# Patient Record
Sex: Female | Born: 1985 | Race: White | Hispanic: No | Marital: Married | Smoking: Former smoker
Health system: Southern US, Community
[De-identification: ages and names within clinical notes are randomized; demographics above are authoritative.]

## PROBLEM LIST (undated history)

## (undated) ENCOUNTER — Inpatient Hospital Stay (HOSPITAL_COMMUNITY): Payer: Self-pay

## (undated) ENCOUNTER — Emergency Department (HOSPITAL_COMMUNITY): Discharge status: Against Medical Advice | Payer: Self-pay

## (undated) DIAGNOSIS — E669 Obesity, unspecified: Secondary | ICD-10-CM

## (undated) DIAGNOSIS — G43909 Migraine, unspecified, not intractable, without status migrainosus: Secondary | ICD-10-CM

## (undated) DIAGNOSIS — Z8711 Personal history of peptic ulcer disease: Secondary | ICD-10-CM

## (undated) DIAGNOSIS — G4730 Sleep apnea, unspecified: Secondary | ICD-10-CM

## (undated) DIAGNOSIS — Z8709 Personal history of other diseases of the respiratory system: Secondary | ICD-10-CM

## (undated) DIAGNOSIS — K219 Gastro-esophageal reflux disease without esophagitis: Secondary | ICD-10-CM

## (undated) DIAGNOSIS — K5792 Diverticulitis of intestine, part unspecified, without perforation or abscess without bleeding: Secondary | ICD-10-CM

## (undated) HISTORY — DX: Gastro-esophageal reflux disease without esophagitis: K21.9

## (undated) HISTORY — DX: Obesity, unspecified: E66.9

## (undated) HISTORY — DX: Personal history of peptic ulcer disease: Z87.11

---
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
## 2006-02-12 ENCOUNTER — Emergency Department: Payer: Self-pay | Admitting: General Practice

## 2007-05-19 ENCOUNTER — Emergency Department: Payer: Self-pay | Admitting: Emergency Medicine

## 2007-08-24 ENCOUNTER — Emergency Department: Payer: Self-pay | Admitting: Emergency Medicine

## 2008-03-04 ENCOUNTER — Emergency Department: Payer: Self-pay | Admitting: Emergency Medicine

## 2008-06-04 ENCOUNTER — Emergency Department: Payer: Self-pay | Admitting: Unknown Physician Specialty

## 2008-08-24 ENCOUNTER — Emergency Department: Payer: Self-pay | Admitting: Emergency Medicine

## 2008-09-28 ENCOUNTER — Emergency Department (HOSPITAL_COMMUNITY): Admission: EM | Admit: 2008-09-28 | Discharge: 2008-09-28 | Payer: Self-pay | Admitting: Emergency Medicine

## 2009-03-02 ENCOUNTER — Emergency Department (HOSPITAL_COMMUNITY): Admission: EM | Admit: 2009-03-02 | Discharge: 2009-03-02 | Payer: Self-pay | Admitting: Emergency Medicine

## 2009-04-18 ENCOUNTER — Emergency Department (HOSPITAL_COMMUNITY): Admission: EM | Admit: 2009-04-18 | Discharge: 2009-04-18 | Payer: Self-pay | Admitting: Emergency Medicine

## 2009-06-03 ENCOUNTER — Emergency Department (HOSPITAL_COMMUNITY): Admission: EM | Admit: 2009-06-03 | Discharge: 2009-06-03 | Payer: Self-pay | Admitting: Family Medicine

## 2009-06-03 ENCOUNTER — Emergency Department (HOSPITAL_COMMUNITY): Admission: EM | Admit: 2009-06-03 | Discharge: 2009-06-04 | Payer: Self-pay | Admitting: Emergency Medicine

## 2009-08-01 ENCOUNTER — Emergency Department (HOSPITAL_COMMUNITY): Admission: EM | Admit: 2009-08-01 | Discharge: 2009-08-01 | Payer: Self-pay | Admitting: Family Medicine

## 2009-10-04 ENCOUNTER — Emergency Department (HOSPITAL_COMMUNITY): Admission: EM | Admit: 2009-10-04 | Discharge: 2009-10-04 | Payer: Self-pay | Admitting: Family Medicine

## 2010-01-28 ENCOUNTER — Emergency Department (HOSPITAL_COMMUNITY): Admission: EM | Admit: 2010-01-28 | Discharge: 2010-01-28 | Payer: Self-pay | Admitting: Family Medicine

## 2010-05-12 ENCOUNTER — Emergency Department (HOSPITAL_COMMUNITY): Admission: EM | Admit: 2010-05-12 | Discharge: 2010-05-12 | Payer: Self-pay | Admitting: Family Medicine

## 2010-07-15 ENCOUNTER — Emergency Department (HOSPITAL_COMMUNITY): Admission: EM | Admit: 2010-07-15 | Discharge: 2010-07-15 | Payer: Self-pay | Admitting: Family Medicine

## 2010-09-24 ENCOUNTER — Inpatient Hospital Stay (INDEPENDENT_AMBULATORY_CARE_PROVIDER_SITE_OTHER)
Admission: RE | Admit: 2010-09-24 | Discharge: 2010-09-24 | Disposition: A | Discharge status: Home / Self Care | Payer: Self-pay | Source: Ambulatory Visit | Attending: Family Medicine | Admitting: Family Medicine

## 2010-09-24 DIAGNOSIS — J069 Acute upper respiratory infection, unspecified: Secondary | ICD-10-CM

## 2010-11-05 LAB — POCT RAPID STREP A (OFFICE): Streptococcus, Group A Screen (Direct): NEGATIVE

## 2010-11-21 LAB — POCT URINALYSIS DIP (DEVICE)
Bilirubin Urine: NEGATIVE
Glucose, UA: NEGATIVE mg/dL
Nitrite: NEGATIVE
Specific Gravity, Urine: 1.02 (ref 1.005–1.030)
Urobilinogen, UA: 0.2 mg/dL (ref 0.0–1.0)
pH: 7 (ref 5.0–8.0)

## 2010-11-21 LAB — POCT I-STAT, CHEM 8
BUN: 17 mg/dL (ref 6–23)
Calcium, Ion: 1.18 mmol/L (ref 1.12–1.32)
Chloride: 105 mEq/L (ref 96–112)
Glucose, Bld: 91 mg/dL (ref 70–99)
Hemoglobin: 14.3 g/dL (ref 12.0–15.0)
Sodium: 141 mEq/L (ref 135–145)
TCO2: 26 mmol/L (ref 0–100)

## 2010-11-21 LAB — POCT PREGNANCY, URINE: Preg Test, Ur: NEGATIVE

## 2010-11-21 LAB — DIFFERENTIAL
Eosinophils Relative: 4 % (ref 0–5)
Neutro Abs: 3.2 10*3/uL (ref 1.7–7.7)

## 2010-11-21 LAB — CBC
MCV: 93.7 fL (ref 78.0–100.0)
RDW: 12.8 % (ref 11.5–15.5)
WBC: 5 10*3/uL (ref 4.0–10.5)

## 2011-01-02 NOTE — H&P (Signed)
NAME:  Robyn Ortiz, Robyn Ortiz                       ACCOUNT NO.:  [id number]   MEDICAL RECORD NO.:  [id number]                   PATIENT TYPE:   LOCATION:                                       FACILITY:  [facility]   PHYSICIAN:  Charles A. Sydnee Cabal, MD            DATE OF BIRTH:  1986-03-13   DATE OF ADMISSION:  10/31/2003  DATE OF DISCHARGE:                                HISTORY & PHYSICAL   BRIEF HISTORY:  25 year old gravida 1 para 0 with [facility] October 23, 2003 now  to be 41 weeks and 1 day estimated gestational age at admission is admitted  to be induced secondary to postdates and a favorable cervix.  She notes  active fetal movement in the office today, has had contractions q.4-37m.  which are mild.  Last office visit on October 26, 2003 cervix was 3 cm  dilated, 50% effaced, -2 station, vertex and intact.  She had a normal  biophysical profile on October 29, 2003 and a reactive NST on October 29, 2003  as well.   PAST MEDICAL HISTORY:  Unremarkable.   SURGICAL HISTORY:  None.   MEDICATIONS:  Prenatal vitamins.   ALLERGIES:  CODEINE.  She states that penicillin is usually not strong  enough for her but is not allergic to this medication.   SOCIAL HISTORY:  Teen pregnancy.  Father of baby involved, Clovis Riley.  Denies  tobacco, alcohol, or drug use.   FAMILY HISTORY:  Patient's father has an enlarged heart, otherwise  unremarkable.   REVIEW OF SYSTEMS:  No skin lesions, headaches, dizziness, chest pain,  shortness of breath, wheezing, diarrhea, constipation, rectal bleeding,  urgency, frequency, or dysuria.   PHYSICAL EXAMINATION:  GENERAL:  Alert and oriented x3.  No distress.  VITALS TODAY:  Blood pressure 100/60, respirations 18, pulse 90, weight 225  pounds.  HEENT:  Exam grossly normal.  NECK:  Supple without thyromegaly or adenopathy.  LUNGS:  Clear to auscultation.  HEART:  A 2/6 systolic ejection murmur left sternal border, otherwise  regular rate and rhythm.  BREAST:  Exam  deferred.  Breasts are symmetrical.  ABDOMEN:  Gravid, fundal height 41 cm, estimated fetal weight 3800 g.  PELVIC:  Normal external female genitalia.  Bartholin, urethral, Skene  glands within normal limits.  Cervix not reexamined today, was 3, 50, and -2  late last week.  Adnexa indeterminate.  RECTAL:  External hemorrhoids not grossly present.  Anus and perineal body  appear normal.   PRENATAL LABORATORIES:  Blood type AB positive, antibody screen negative,  VDRL nonreactive, rubella immune, hepatitis B surface antigen negative, HIV  nonreactive, Pap smear normal, GC and Chlamydia normal, group B strep  positive September 25, 2003, hemoglobin August 07, 2003 12.5, glucose 1-hour  glucola August 07, 2003 was 76, quad screen was normal.  Ultrasounds done  as early as 14 weeks 0 days giving [facility] of October 23, 2003 with uncertain LMP.  Anatomical ultrasounds at 20 weeks 3 days incomplete, complete anatomy was  noted normal 24 weeks and 6 days.   ASSESSMENT:  1. Intrauterine pregnancy 41 weeks and 1 day.  2. Favorable cervix.   PLAN:  I have recommended induction.  Patient gives informed consent and  wishes to proceed.  We will proceed with low-dose Pitocin and artificial  rupture of membranes.  Group B strep prophylaxis will be done with  penicillin.  All questions were answered.  She will be admitted tomorrow  evening on October 31, 2003 at 9:15 p.m. for induction.                                               Charles A. Sydnee Cabal, MD    CAD/MEDQ  D:  10/30/2003  T:  10/31/2003  Job:  [id number]

## 2011-01-02 NOTE — Op Note (Signed)
NAME:  Robyn Ortiz, Robyn Ortiz                       ACCOUNT NO.:  [id number]   MEDICAL RECORD NO.:  [id number]                   PATIENT TYPE:  INP   LOCATION:  9136                                 [facility]ACILITY:  [facility]   PHYSICIAN:  Charles A. Sydnee Cabal, MD            DATE OF BIRTH:  Jun 03, 1986   DATE OF PROCEDURE:  11/01/2003  DATE OF DISCHARGE:                                 OPERATIVE REPORT   DELIVERY NOTE:  See dictated history and physical for complete admission  note.  At 0800 this morning we changed plan to Cervidil overnight and then  Pitocin this morning.  Cervidil was placed about 2220 and removed about  0630.  Cervix change was to a good 3 cm after Cervidil.  The patient  complained of contractions, moderate, q.3-1m., active fetal movement was  noted, no ruptured membranes or bleeding.  Cervix was 3 cm dilated, 50%  effaced, -2 station, vertex, and bulging bag of water.  Fetal heart rate was  120-130s, reactive.  Contractions were q.14m.  Informed consent was given.  Artificial rupture of membranes was done to augment labor.  Moderate  meconium was noted and she consented for IUPC, and this was placed without  difficulty.  Amnioinfusion was started with lactated Ringer's, a 200 mL  bolus and then 150 mL per hour.  Penicillin was given for group B strep  prophylaxis, as she was group B strep positive.  At 12:45 she had been  checked about 30 minutes prior by the R.N. and she was 5-6 cm dilated.  Fetal heart rate was 120-130s, reactive without decelerations.  Contractions  were q.25m. with about 240 [place] units.  Pitocin was at a low dose at  that time.  See record for actual dose in milli-international units per  minute.  She progressed on to complete dilation at approximately 2:35 and  began pushing at that time.  She pushed to spontaneous vaginal delivery just  after 3.  For specific time see delivery record.  Placenta followed  spontaneously and was three-vessel and intact.  DeLee  on the perineum was  carried out, and the baby cried spontaneously thereafter.  The cord was  wrapped around the body x2.  The placenta was spontaneous, three-vessel, and  intact.  The cord gases returned arterial 7.22 and venous 7.35.  First  degree laceration was bleeding at the perineum on the right just inside the  introitus.  This was sutured with 3-0 Vicryl with good hemostasis resulting.  There was a left periurethral laceration on the left upper inner labia  minora.  This required no stitches as it was not bleeding.  Estimated blood  loss was 500 mL.  There was mild atony, which responded to clot expression  and massage.  No further significant bleeding was noted.  Mother and baby  were recovering stably at this time.  Charles A. Sydnee Cabal, MD    CAD/MEDQ  D:  11/01/2003  T:  11/03/2003  Job:  [id number]

## 2011-01-02 NOTE — Discharge Summary (Signed)
NAME:  Robyn Ortiz, TRULSON                       ACCOUNT NO.:  [id number]   MEDICAL RECORD NO.:  [id number]                   PATIENT TYPE:  OBV   LOCATION:  9169                                 [facility]ACILITY:  [facility]   PHYSICIAN:  Juan H. Lily Peer, M.D.             DATE OF BIRTH:  March 20, 1986   DATE OF ADMISSION:  10/19/2003  DATE OF DISCHARGE:  10/20/2003                                 DISCHARGE SUMMARY   DISCHARGE DIAGNOSES:  1. Intrauterine pregnancy at 39+ weeks, undelivered.  2. False labor.  3. Positive group B Streptococcus.   HISTORY:  A 17-years-of-age female gravida 1 para 0 with an EDC of October 23, 2003.  Prenatal course had been benign except for positive group B strep.   HOSPITAL COURSE:  On October 19, 2003 the patient presented at 39+ weeks with  contractions.  She was initially found by nursing to be 2 cm dilated, 70%  effaced, ambulated for a few hours, returned, and then was admitted for  observation because she was uncomfortable.  However, M.D. exam she was 1 cm,  80%, and -3.  The patient had been begun on IV antibiotics for group B strep  prophylaxis and was observed throughout the night.  On October 20, 2003 the  patient had been monitored throughout the night and contractions had  subsided.  With cervix unchanged at 1 cm, 80%, -3, and high.  Fetal heart  rate was reassuring and the patient was discharged to home with labor  instructions.   ACCESSORY CLINICAL FINDINGS/LABORATORY DATA:  On October 19, 2003 hemoglobin  13.2.   DISPOSITION:  The patient was discharged to home with labor instructions.  She was to follow up with Dr. Sydnee Cabal next week if not delivered.  If she  had any problem prior to that time to be seen in the office.     Susa Loffler, P.A.                    Juan H. Lily Peer, M.D.    TSG/MEDQ  D:  11/12/2003  T:  11/12/2003  Job:  [id number]

## 2011-01-02 NOTE — H&P (Signed)
NAME:  Robyn Ortiz, Robyn Ortiz                       ACCOUNT NO.:  [id number]   MEDICAL RECORD NO.:  [id number]                   PATIENT TYPE:  INP   LOCATION:  9169                                 [facility]   PHYSICIAN:  Juan H. Lily Peer, M.D.             DATE OF BIRTH:  August 19, 1985   DATE OF ADMISSION:  10/19/2003  DATE OF DISCHARGE:                                HISTORY & PHYSICAL   CHIEF COMPLAINT:  Contractions.   HISTORY:  The patient is a 25 year old gravida 1, para 0 with an estimated  date of confinement based on ultrasound of March the 08th.  The patient is  currently at 39-4/7ths weeks gestation presents to [facility]  complaining of contractions, which she states started the night before.  She  initially was found to be 2 cm dilated and 70% effaced.  She ambulated for a  few hours, returned,  reexamined again and is now 3 cm, 70-80%, -2 to -3  station and is uncomfortable, and she is admitted.   The patient's prenatal course has been benign with the exception of positive  GBS culture.   PAST MEDICAL HISTORY:  Unremarkable.  A teen pregnancy.   ALLERGIES:  Denies any allergies.   REVIEW OF SYSTEMS:  See [place] form.   PHYSICAL EXAMINATION:  VITAL SIGNS:  Blood pressure 121/80, pulse 75,  temperature 98 and respirations 14.  HEENT: Unremarkable.  NECK:  Neck is supple.  Trachea midline.  No carotid bruits.  No  thyromegaly.  LUNGS:  Lungs are clear to auscultation without rhonchi or wheezes.  HEART:  Regular rate and rhythm.  No murmurs or gallops.  BREASTS:  Breast exam not done.  ABDOMEN:  Gravid uterus, vertex presentation.  _________________.  PELVIC EXAMINATION:  Three centimeters, 80% effaced, -3 station, intact  membranes.  EXTREMITIES:  Trace edema.   PRENATAL LABORATORY DATA:  Blood type AB positive, negative antibody screen.  VDRL was nonreactive.  Rubella titer with evidence of immunity.  Hepatitis B  surface antigen and HIV were negative.   Alpha fetoprotein and triple screen  reported to be normal.  Positive GBS culture.   ASSESSMENT:  1. [age]-year-old gravida 1, para 0 at 39-5/7ths weeks gestation in labor.     Cervix three centimeters, 80% effaced, -2  to -3 station, and reassuring     fetal heart rate tracing.  2. History of positive group B Streptococcus culture.   We will then proceed with pen-G for prophylaxis and have offered her an  epidural, __________ on board.  We may proceed with rupturing of membranes  and internal monitoring.   PLAN:  The plan is for assessment above.  Anticipate vaginal delivery.  Juan H. Lily Peer, M.D.    JHF/MEDQ  D:  10/19/2003  T:  10/20/2003  Job:  [id number]   cc:   Leonette Most A. Sydnee Cabal, MD  Fax: [phone]

## 2011-02-09 ENCOUNTER — Ambulatory Visit (INDEPENDENT_AMBULATORY_CARE_PROVIDER_SITE_OTHER): Payer: Self-pay

## 2011-02-09 ENCOUNTER — Emergency Department (HOSPITAL_COMMUNITY): Payer: Self-pay

## 2011-02-09 ENCOUNTER — Inpatient Hospital Stay (INDEPENDENT_AMBULATORY_CARE_PROVIDER_SITE_OTHER)
Admission: RE | Admit: 2011-02-09 | Discharge: 2011-02-09 | Disposition: A | Discharge status: Home / Self Care | Payer: Self-pay | Source: Ambulatory Visit | Attending: Emergency Medicine | Admitting: Emergency Medicine

## 2011-02-09 ENCOUNTER — Emergency Department (HOSPITAL_COMMUNITY)
Admission: EM | Admit: 2011-02-09 | Discharge: 2011-02-09 | Disposition: A | Discharge status: Home / Self Care | Payer: Self-pay | Attending: Emergency Medicine | Admitting: Emergency Medicine

## 2011-02-09 DIAGNOSIS — S40019A Contusion of unspecified shoulder, initial encounter: Secondary | ICD-10-CM | POA: Insufficient documentation

## 2011-02-09 DIAGNOSIS — R10814 Left lower quadrant abdominal tenderness: Secondary | ICD-10-CM

## 2011-02-09 DIAGNOSIS — S20219A Contusion of unspecified front wall of thorax, initial encounter: Secondary | ICD-10-CM | POA: Insufficient documentation

## 2011-02-09 DIAGNOSIS — M25519 Pain in unspecified shoulder: Secondary | ICD-10-CM | POA: Insufficient documentation

## 2011-02-09 DIAGNOSIS — R109 Unspecified abdominal pain: Secondary | ICD-10-CM | POA: Insufficient documentation

## 2011-02-09 DIAGNOSIS — R079 Chest pain, unspecified: Secondary | ICD-10-CM | POA: Insufficient documentation

## 2011-02-09 LAB — BASIC METABOLIC PANEL
BUN: 15 mg/dL (ref 6–23)
CO2: 25 mEq/L (ref 19–32)
Calcium: 8.7 mg/dL (ref 8.4–10.5)
Creatinine, Ser: 0.53 mg/dL (ref 0.50–1.10)
GFR calc Af Amer: >60 mL/min (ref >60)
Sodium: 137 mEq/L (ref 135–145)

## 2011-02-09 LAB — CBC
HCT: 33.6 % — ABNORMAL LOW (ref 36.0–46.0)
Hemoglobin: 11.6 g/dL — ABNORMAL LOW (ref 12.0–15.0)
WBC: 5.2 10*3/uL (ref 4.0–10.5)

## 2011-02-09 LAB — POCT PREGNANCY, URINE: Preg Test, Ur: NEGATIVE

## 2011-02-09 MED ORDER — IOHEXOL 300 MG/ML  SOLN
100.0000 mL | Freq: Once | INTRAMUSCULAR | Status: AC | PRN
  Administered 2011-02-09: 100 mL via INTRAVENOUS

## 2011-04-03 ENCOUNTER — Inpatient Hospital Stay (HOSPITAL_COMMUNITY)
Admission: RE | Admit: 2011-04-03 | Discharge: 2011-04-03 | Disposition: A | Discharge status: Home / Self Care | Payer: Self-pay | Source: Ambulatory Visit | Attending: Emergency Medicine | Admitting: Emergency Medicine

## 2011-04-04 ENCOUNTER — Inpatient Hospital Stay (INDEPENDENT_AMBULATORY_CARE_PROVIDER_SITE_OTHER)
Admission: RE | Admit: 2011-04-04 | Discharge: 2011-04-04 | Disposition: A | Discharge status: Home / Self Care | Payer: Self-pay | Source: Ambulatory Visit | Attending: Family Medicine | Admitting: Family Medicine

## 2011-04-04 DIAGNOSIS — H1045 Other chronic allergic conjunctivitis: Secondary | ICD-10-CM

## 2011-04-04 DIAGNOSIS — R599 Enlarged lymph nodes, unspecified: Secondary | ICD-10-CM

## 2011-05-08 ENCOUNTER — Inpatient Hospital Stay (INDEPENDENT_AMBULATORY_CARE_PROVIDER_SITE_OTHER)
Admission: RE | Admit: 2011-05-08 | Discharge: 2011-05-08 | Disposition: A | Discharge status: Home / Self Care | Payer: Self-pay | Source: Ambulatory Visit | Attending: Family Medicine | Admitting: Family Medicine

## 2011-05-08 DIAGNOSIS — J020 Streptococcal pharyngitis: Secondary | ICD-10-CM

## 2011-05-08 LAB — POCT INFECTIOUS MONO SCREEN: Mono Screen: NEGATIVE

## 2011-09-16 ENCOUNTER — Emergency Department (INDEPENDENT_AMBULATORY_CARE_PROVIDER_SITE_OTHER)
Admission: EM | Admit: 2011-09-16 | Discharge: 2011-09-16 | Disposition: A | Discharge status: Home / Self Care | Payer: Self-pay | Source: Home / Self Care | Attending: Emergency Medicine | Admitting: Emergency Medicine

## 2011-09-16 ENCOUNTER — Emergency Department (INDEPENDENT_AMBULATORY_CARE_PROVIDER_SITE_OTHER): Payer: Self-pay

## 2011-09-16 ENCOUNTER — Encounter (HOSPITAL_COMMUNITY): Payer: Self-pay | Admitting: *Deleted

## 2011-09-16 DIAGNOSIS — K5900 Constipation, unspecified: Secondary | ICD-10-CM

## 2011-09-16 DIAGNOSIS — R109 Unspecified abdominal pain: Secondary | ICD-10-CM

## 2011-09-16 LAB — POCT I-STAT, CHEM 8
Calcium, Ion: 1.22 mmol/L (ref 1.12–1.32)
Glucose, Bld: 80 mg/dL (ref 70–99)
HCT: 44 % (ref 36.0–46.0)
TCO2: 24 mmol/L (ref 0–100)

## 2011-09-16 LAB — CBC
HCT: 38.7 % (ref 36.0–46.0)
RDW: 12.5 % (ref 11.5–15.5)
WBC: 5.1 10*3/uL (ref 4.0–10.5)

## 2011-09-16 LAB — RAPID URINE DRUG SCREEN, HOSP PERFORMED
Amphetamines: NOT DETECTED
Barbiturates: NOT DETECTED
Benzodiazepines: NOT DETECTED
Cocaine: NOT DETECTED
Opiates: NOT DETECTED
Tetrahydrocannabinol: NOT DETECTED

## 2011-09-16 LAB — DIFFERENTIAL
Basophils Absolute: 0 10*3/uL (ref 0.0–0.1)
Basophils Relative: 1 % (ref 0–1)
Lymphocytes Relative: 29 % (ref 12–46)
Monocytes Absolute: 0.4 10*3/uL (ref 0.1–1.0)
Neutro Abs: 3 10*3/uL (ref 1.7–7.7)
Neutrophils Relative %: 59 % (ref 43–77)

## 2011-09-16 LAB — HEPATIC FUNCTION PANEL
AST: 14 U/L (ref 0–37)
Albumin: 3.8 g/dL (ref 3.5–5.2)
Total Bilirubin: 0.4 mg/dL (ref 0.3–1.2)
Total Protein: 7.1 g/dL (ref 6.0–8.3)

## 2011-09-16 LAB — POCT URINALYSIS DIP (DEVICE)
Bilirubin Urine: NEGATIVE
Glucose, UA: NEGATIVE mg/dL
Hgb urine dipstick: NEGATIVE
Specific Gravity, Urine: 1.025 (ref 1.005–1.030)
Urobilinogen, UA: 0.2 mg/dL (ref 0.0–1.0)
pH: 6 (ref 5.0–8.0)

## 2011-09-16 LAB — LIPASE, BLOOD: Lipase: 28 U/L (ref 11–59)

## 2011-09-16 MED ORDER — OMEPRAZOLE 20 MG PO CPDR: 40.0000 mg | DELAYED_RELEASE_CAPSULE | Freq: Every day | ORAL | Status: DC

## 2011-09-16 NOTE — ED Provider Notes (Addendum)
History     CSN: [id number]  Arrival date & time 09/16/11  1010   First MD Initiated Contact with Patient 09/16/11 1029      Chief Complaint  Patient presents with  . Abdominal Pain    (Consider location/radiation/quality/duration/timing/severity/associated sxs/prior treatment) HPI Comments: Sudden pain on my abdomen (points to RUQ and epigastric area), no vomiting took one motrin, felt better, but still sore a bit, No fevers, No vomitting  Patient is a 26 y.o. female presenting with abdominal pain. The history is provided by the patient.  Abdominal Pain The primary symptoms of the illness include abdominal pain. The primary symptoms of the illness do not include fever, fatigue, shortness of breath, vomiting, diarrhea, hematochezia or dysuria. The onset of the illness was sudden.  Symptoms associated with the illness do not include chills, anorexia, urgency or frequency. Significant associated medical issues do not include PUD.    History reviewed. No pertinent past medical history.  History reviewed. No pertinent past surgical history.  History reviewed. No pertinent family history.  History  Substance Use Topics  . Smoking status: Current Everyday Smoker -- 1.0 packs/day    Types: Cigarettes  . Smokeless tobacco: Not on file  . Alcohol Use: Yes     occassional    OB History    Grav Para Term Preterm Abortions TAB SAB Ect Mult Living                  Review of Systems  Constitutional: Negative for fever, chills, activity change and fatigue.  Respiratory: Negative for cough and shortness of breath.   Gastrointestinal: Positive for abdominal pain. Negative for vomiting, diarrhea, hematochezia and anorexia.  Genitourinary: Negative for dysuria, urgency and frequency.    Allergies  Hydrocodone  Home Medications   Current Outpatient Rx  Name Route Sig Dispense Refill  . LEVONORGEST-ETH ESTRAD 91-DAY 0.15-0.03 MG PO TABS Oral Take 1 tablet by mouth daily.    Marland Kitchen  OMEPRAZOLE 20 MG PO CPDR Oral Take 2 capsules (40 mg total) by mouth daily. 7 capsule 0    BP 110/51  Pulse 68  Temp(Src) 98 F (36.7 C) (Oral)  Resp 16  SpO2 98%  LMP 08/01/2011  Physical Exam  Nursing note and vitals reviewed. Constitutional: She appears well-developed and well-nourished. No distress.  HENT:  Head: Normocephalic.  Eyes: EOM are normal. No scleral icterus.  Pulmonary/Chest: Effort normal.  Abdominal: Soft. She exhibits no shifting dullness, no distension and no mass. There is no hepatomegaly. There is tenderness in the right upper quadrant and epigastric area. There is no rigidity, no rebound, no guarding, no tenderness at McBurney's point and negative Murphy's sign.    Skin: Skin is warm. No rash noted. No erythema.    ED Course  Procedures (including critical care time)   Labs Reviewed  POCT URINALYSIS DIP (DEVICE)  POCT PREGNANCY, URINE  CBC  DIFFERENTIAL  LIPASE, BLOOD  HEPATIC FUNCTION PANEL  URINE RAPID DRUG SCREEN (HOSP PERFORMED)  POCT I-STAT, CHEM 8   Dg Abd 1 View  09/16/2011  *RADIOLOGY REPORT*  Clinical Data: Generalized abdominal pain.  ABDOMEN - 1 VIEW  Comparison: CT 02/09/2011  Findings: Moderate stool burden in the right side of the colon. There is a nonobstructive bowel gas pattern.  No supine evidence of free air.  No organomegaly or suspicious calcification.  No acute bony abnormality.  IMPRESSION: Moderate stool burden.  No acute findings.  Original Report Authenticated By: Cyndie Chime, M.D.  1. Abdominal pain   2. Constipation       MDM  Abdominal pain- sudden onset less < 24 hours- afebrile- moderate stool burden- afebrile No vomit ting- soft and non-reactive abdomen        Jimmie Molly, MD 09/16/11 1238  Jimmie Molly, MD 09/16/11 1339

## 2011-09-16 NOTE — ED Notes (Signed)
Pt  Informed  Of     Delay  In lab  Work

## 2011-09-16 NOTE — ED Notes (Signed)
Pt states she was awakened out of her sleep early this am with upper abd pain, sharp in nature.  Denies nausea, vomiting or diarrhea.  States last BM was yesterday am which was normal.  States pain eased up after taking some Ibuprofen but it's still there.  States pain is across upper abd but goes down both sides.  States she didn't eat anything different than normal last pm.  No fried or greasy foods.  Missed a few of her BC pills and has not had a period since 08/01/11

## 2011-10-06 ENCOUNTER — Emergency Department (INDEPENDENT_AMBULATORY_CARE_PROVIDER_SITE_OTHER)
Admission: EM | Admit: 2011-10-06 | Discharge: 2011-10-06 | Disposition: A | Discharge status: Home / Self Care | Payer: Self-pay | Source: Home / Self Care | Attending: Emergency Medicine | Admitting: Emergency Medicine

## 2011-10-06 ENCOUNTER — Encounter (HOSPITAL_COMMUNITY): Payer: Self-pay | Admitting: *Deleted

## 2011-10-06 DIAGNOSIS — J039 Acute tonsillitis, unspecified: Secondary | ICD-10-CM

## 2011-10-06 LAB — POCT RAPID STREP A: Streptococcus, Group A Screen (Direct): NEGATIVE

## 2011-10-06 MED ORDER — HYDROCODONE-ACETAMINOPHEN 5-325 MG PO TABS: 2.0000 | ORAL_TABLET | ORAL | Status: AC | PRN

## 2011-10-06 MED ORDER — PENICILLIN V POTASSIUM 500 MG PO TABS: 500.0000 mg | ORAL_TABLET | Freq: Four times a day (QID) | ORAL | Status: AC

## 2011-10-06 NOTE — Discharge Instructions (Signed)
Tonsillitis     Tonsils are lumps of tissues at the back of the throat. Tonsillitis is an infection of the throat. It causes the tonsils to become red, tender, and puffy (swollen). If the tonsillitis is severe and happens often, you may need to get your tonsils removed (tonsillectomy).  HOME CARE   · Rest often.   · Drink plenty of fluids to keep your pee (urine) clear or pale yellow.   · While the throat is sore, eat soft or liquid foods like:   · Milkshakes.   · Ice cream.   · Soups.   · Suck on frozen ice pops.   · Gargle with a warm or cold liquid to help soothe the throat. Gargle with a water and salt mix.   · Do not go to work or school until your fever is gone.   · Only take medicines as told by your doctor   GET HELP RIGHT AWAY IF:   · You develop a rash.   · You cough up yellow-brown or bloody spit.   · You start to throw up (vomit).   · You develop a headache, stiff neck, chest pain, or have trouble breathing or swallowing.   · You have worse throat pain and drooling.   · You cannot open your mouth fully.   · Your voice changes.   · You have a fever.   · Your baby is older than 3 months with a rectal temperature of 102° F (38.9° C) or higher.   · Your baby is 3 months old or younger with a rectal temperature of 100.4° F (38° C) or higher.   MAKE SURE YOU:   · Understand these instructions.   · Will watch your condition.   · Will get help right away if you are not doing well or get worse.   Document Released: 01/20/2008 Document Revised: 04/15/2011 Document Reviewed: 01/20/2008  Exit[employer]® Patient Information ©2012 [employer].

## 2011-10-06 NOTE — ED Notes (Signed)
Pt  Reports  sorethroat       Body  Aches     Chills      As  Well     As      Headaches    Symptoms  X     3  Days   somehta  Dry  Non  Productive  Cough  exhibited

## 2011-10-06 NOTE — ED Provider Notes (Signed)
History     CSN: [id number]  Arrival date & time 10/06/11  1305   First MD Initiated Contact with Patient 10/06/11 1457      Chief Complaint  Patient presents with  . Sore Throat    (Consider location/radiation/quality/duration/timing/severity/associated sxs/prior treatment) Patient is a 26 y.o. female presenting with pharyngitis. The history is provided by the patient. No language interpreter was used.  Sore Throat This is a new problem. The current episode started more than 2 days ago. The problem occurs constantly. The problem has been gradually worsening. Associated symptoms include headaches. The symptoms are aggravated by nothing. The symptoms are relieved by nothing. She has tried nothing for the symptoms.  Pt complains of a sorethroat and swollen tonsils  History reviewed. No pertinent past medical history.  History reviewed. No pertinent past surgical history.  History reviewed. No pertinent family history.  History  Substance Use Topics  . Smoking status: Current Everyday Smoker -- 1.0 packs/day    Types: Cigarettes  . Smokeless tobacco: Not on file  . Alcohol Use: Yes     occassional    OB History    Grav Para Term Preterm Abortions TAB SAB Ect Mult Living                  Review of Systems  HENT: Positive for congestion and rhinorrhea.   Neurological: Positive for headaches.  All other systems reviewed and are negative.    Allergies  Hydrocodone  Home Medications   Current Outpatient Rx  Name Route Sig Dispense Refill  . LEVONORGEST-ETH ESTRAD 91-DAY 0.15-0.03 MG PO TABS Oral Take 1 tablet by mouth daily.    Marland Kitchen OMEPRAZOLE 20 MG PO CPDR Oral Take 2 capsules (40 mg total) by mouth daily. 7 capsule 0    BP 118/71  Pulse 84  Temp(Src) 98.2 F (36.8 C) (Oral)  Resp 16  SpO2 98%  LMP 09/25/2011  Physical Exam  Vitals reviewed. Constitutional: She appears well-developed and well-nourished.  HENT:  Head: Normocephalic and atraumatic.  Right  Ear: External ear normal.  Left Ear: External ear normal.  Mouth/Throat: Oropharyngeal exudate present.       Swollen exudative tonsils,  Eyes: Conjunctivae are normal. Pupils are equal, round, and reactive to light.  Neck: Normal range of motion. Neck supple.  Cardiovascular: Normal rate.   Pulmonary/Chest: Effort normal and breath sounds normal.  Abdominal: Soft.  Musculoskeletal: Normal range of motion.  Neurological: She is alert.  Skin: Skin is warm.    ED Course  Procedures (including critical care time)   Labs Reviewed  POCT RAPID STREP A ([facility]RG CARE ONLY)   No results found.   No diagnosis found.    MDM  Pcn  Given for tonsillitis.  Rx for hydrocodone for pain        Langston Masker, [place] 10/06/11 1521

## 2011-10-08 NOTE — ED Provider Notes (Signed)
Medical screening examination/treatment/procedure(s) were performed by non-physician practitioner and as supervising physician I was immediately available for consultation/collaboration.  Leslee Home, M.D.   Roque Lias, MD 10/08/11 1020

## 2012-01-01 ENCOUNTER — Emergency Department (HOSPITAL_COMMUNITY)
Admission: EM | Admit: 2012-01-01 | Discharge: 2012-01-01 | Disposition: A | Discharge status: Home / Self Care | Payer: Self-pay | Source: Home / Self Care

## 2012-01-04 ENCOUNTER — Encounter (HOSPITAL_COMMUNITY): Payer: Self-pay | Admitting: *Deleted

## 2012-01-04 ENCOUNTER — Emergency Department (HOSPITAL_COMMUNITY)
Admission: EM | Admit: 2012-01-04 | Discharge: 2012-01-04 | Disposition: A | Discharge status: Home / Self Care | Payer: Self-pay | Attending: Emergency Medicine | Admitting: Emergency Medicine

## 2012-01-04 DIAGNOSIS — X19XXXA Contact with other heat and hot substances, initial encounter: Secondary | ICD-10-CM | POA: Insufficient documentation

## 2012-01-04 DIAGNOSIS — T24039A Burn of unspecified degree of unspecified lower leg, initial encounter: Secondary | ICD-10-CM | POA: Insufficient documentation

## 2012-01-04 DIAGNOSIS — T310 Burns involving less than 10% of body surface: Secondary | ICD-10-CM | POA: Insufficient documentation

## 2012-01-04 DIAGNOSIS — T300 Burn of unspecified body region, unspecified degree: Secondary | ICD-10-CM

## 2012-01-04 MED ORDER — CEPHALEXIN 500 MG PO CAPS: 500.0000 mg | ORAL_CAPSULE | Freq: Four times a day (QID) | ORAL | Status: AC

## 2012-01-04 MED ORDER — OXYCODONE-ACETAMINOPHEN 5-325 MG PO TABS
1.0000 | ORAL_TABLET | Freq: Once | ORAL | Status: AC
  Administered 2012-01-04: 1 via ORAL
  Filled 2012-01-04: qty 1

## 2012-01-04 MED ORDER — OXYCODONE-ACETAMINOPHEN 5-325 MG PO TABS: 2.0000 | ORAL_TABLET | ORAL | Status: AC | PRN
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
## 2012-01-04 NOTE — Discharge Instructions (Signed)
Keep your wound clean and dry.  You may wash it with soap and water.   Use ibuprofen 600 mg every 6 hours for pain and swelling.  Use percocet for more severe pain. Use keflex for infection.  Follow up with your doctor as needed.
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
## 2012-01-26 ENCOUNTER — Encounter (HOSPITAL_COMMUNITY): Payer: Self-pay | Admitting: Emergency Medicine

## 2012-01-26 ENCOUNTER — Emergency Department (HOSPITAL_COMMUNITY)
Admission: EM | Admit: 2012-01-26 | Discharge: 2012-01-26 | Disposition: A | Discharge status: Home Health | Payer: Self-pay | Attending: Emergency Medicine | Admitting: Emergency Medicine

## 2012-01-26 DIAGNOSIS — J029 Acute pharyngitis, unspecified: Secondary | ICD-10-CM

## 2012-01-26 DIAGNOSIS — R070 Pain in throat: Secondary | ICD-10-CM | POA: Insufficient documentation

## 2012-01-26 LAB — CBC
Hemoglobin: 13.1 g/dL (ref 12.0–15.0)
MCH: 31 pg (ref 26.0–34.0)
MCHC: 34.1 g/dL (ref 30.0–36.0)
RDW: 12.7 % (ref 11.5–15.5)

## 2012-01-26 LAB — RAPID STREP SCREEN (MED CTR MEBANE ONLY): Streptococcus, Group A Screen (Direct): POSITIVE — AB

## 2012-01-26 LAB — MONONUCLEOSIS SCREEN: Mono Screen: NEGATIVE

## 2012-01-26 MED ORDER — DEXAMETHASONE SODIUM PHOSPHATE 10 MG/ML IJ SOLN
10.0000 mg | Freq: Once | INTRAMUSCULAR | Status: AC
  Administered 2012-01-26: 10 mg via INTRAVENOUS
  Filled 2012-01-26: qty 1

## 2012-01-26 MED ORDER — SODIUM CHLORIDE 0.9 % IV BOLUS (SEPSIS)
1000.0000 mL | Freq: Once | INTRAVENOUS | Status: AC
  Administered 2012-01-26: 1000 mL via INTRAVENOUS

## 2012-01-26 MED ORDER — IBUPROFEN 600 MG PO TABS: 600.0000 mg | ORAL_TABLET | Freq: Four times a day (QID) | ORAL | Status: AC | PRN

## 2012-01-26 MED ORDER — AMOXICILLIN 500 MG PO CAPS: 500.0000 mg | ORAL_CAPSULE | Freq: Three times a day (TID) | ORAL | Status: AC

## 2012-01-26 MED ORDER — HYDROCODONE-ACETAMINOPHEN 5-500 MG PO TABS: 1.0000 | ORAL_TABLET | Freq: Four times a day (QID) | ORAL | Status: AC | PRN

## 2012-01-26 MED ORDER — DEXTROSE 5 % IV SOLN
1.0000 g | INTRAVENOUS | Status: DC
  Administered 2012-01-26: 1 g via INTRAVENOUS
  Filled 2012-01-26: qty 10

## 2012-01-26 NOTE — ED Notes (Signed)
Pt given blanket.

## 2012-01-26 NOTE — ED Notes (Signed)
Pt with sore throat x 3 days, patient unable to swallow due to pain, patient regularly spitting into trash can due to pain, patient with swollen glands in neck

## 2012-01-26 NOTE — Discharge Instructions (Signed)
Go directly to the ENT doctors office now, Dr Jearld Fenton, for evaluation by him.  Do not eat or drink prior to seeing Dr Jearld Fenton. Your strep test is positive. Take amoxicillin as prescribed. Take motrin as need for pain. You may also take vicodin as need for pain. No driving when taking vicodin. Also, do not take tylenol or acetaminophen containing medication when taking vicodin. Return to ER if worse, trouble breathing, unable to swallow, other concern.      Sore Throat Sore throats may be caused by bacteria and viruses. They may also be caused by:  Smoking.   Pollution.   Allergies.  If a sore throat is due to strep infection (a bacterial infection), you may need:  A throat swab.   A culture test to verify the strep infection.  You will need one of these:  An antibiotic shot.   Oral medicine for a full 10 days.  Strep infection is very contagious. A doctor should check any close contacts who have a sore throat or fever. A sore throat caused by a virus infection will usually last only 3-4 days. Antibiotics will not treat a viral sore throat.  Infectious mononucleosis (a viral disease), however, can cause a sore throat that lasts for up to 3 weeks. Mononucleosis can be diagnosed with blood tests. You must have been sick for at least 1 week in order for the test to give accurate results. HOME CARE INSTRUCTIONS   To treat a sore throat, take mild pain medicine.   Increase your fluids.   Eat a soft diet.   Do not smoke.   Gargling with warm water or salt water (1 tsp. salt in 8 oz. water) can be helpful.   Try throat sprays or lozenges or sucking on hard candy to ease the symptoms.  Call your doctor if your sore throat lasts longer than 1 week.  SEEK IMMEDIATE MEDICAL CARE IF:  You have difficulty breathing.   You have increased swelling in the throat.   You have pain so severe that you are unable to swallow fluids or your saliva.   You have a severe headache, a high fever,  vomiting, or a red rash.  Document Released: 09/10/2004 Document Revised: 07/23/2011 Document Reviewed: 07/21/2007 [facility] Patient Information 2012 [place], [place].      Strep Throat Strep throat is an infection of the throat caused by a bacteria named Streptococcus pyogenes. Your caregiver may call the infection streptococcal "tonsillitis" or "pharyngitis" depending on whether there are signs of inflammation in the tonsils or back of the throat. Strep throat is most common in children from 25 to 35 years old during the cold months of the year, but it can occur in people of any age during any season. This infection is spread from person to person (contagious) through coughing, sneezing, or other close contact. SYMPTOMS   Fever or chills.   Painful, swollen, red tonsils or throat.   Pain or difficulty when swallowing.   White or yellow spots on the tonsils or throat.   Swollen, tender lymph nodes or "glands" of the neck or under the jaw.   Red rash all over the body (rare).  DIAGNOSIS  Many different infections can cause the same symptoms. A test must be done to confirm the diagnosis so the right treatment can be given. A "rapid strep test" can help your caregiver make the diagnosis in a few minutes. If this test is not available, a light swab of the infected area can  be used for a throat culture test. If a throat culture test is done, results are usually available in a day or two. TREATMENT  Strep throat is treated with antibiotic medicine. HOME CARE INSTRUCTIONS   Gargle with 1 tsp of salt in 1 cup of warm water, 3 to 4 times per day or as needed for comfort.   Family members who also have a sore throat or fever should be tested for strep throat and treated with antibiotics if they have the strep infection.   Make sure everyone in your household washes their hands well.   Do not share food, drinking cups, or personal items that could cause the infection to spread to others.    You may need to eat a soft food diet until your sore throat gets better.   Drink enough water and fluids to keep your urine clear or pale yellow. This will help prevent dehydration.   Get plenty of rest.   Stay home from school, daycare, or work until you have been on antibiotics for 24 hours.   Only take over-the-counter or prescription medicines for pain, discomfort, or fever as directed by your caregiver.   If antibiotics are prescribed, take them as directed. Finish them even if you start to feel better.  SEEK MEDICAL CARE IF:   The glands in your neck continue to enlarge.   You develop a rash, cough, or earache.   You cough up green, yellow-brown, or bloody sputum.   You have pain or discomfort not controlled by medicines.   Your problems seem to be getting worse rather than better.  SEEK IMMEDIATE MEDICAL CARE IF:   You develop any new symptoms such as vomiting, severe headache, stiff or painful neck, chest pain, shortness of breath, or trouble swallowing.   You develop severe throat pain, drooling, or changes in your voice.   You develop swelling of the neck, or the skin on the neck becomes red and tender.   You have a fever.   You develop signs of dehydration, such as fatigue, dry mouth, and decreased urination.   You become increasingly sleepy, or you cannot wake up completely.  Document Released: 07/31/2000 Document Revised: 07/23/2011 Document Reviewed: 10/02/2010 [facility] Patient Information 2012 [place], [place].      Peritonsillar Abscess A peritonsillar abscess is a collection of pus located in the back of the throat behind the tonsils. It usually occurs when a streptococcal infection of the throat or tonsils spreads into the space around the tonsils. They are almost always caused by the streptococcal germ (bacteria). The treatment of a peritonsillar abscess is most often drainage accomplished by putting a needle into the abscess or cutting (incising) and  draining the abscess. This is most often followed with a course of antibiotics. HOME CARE INSTRUCTIONS  If your abscess was drained by your caregiver today, rinse your throat (gargle) with warm salt water four times per day or as needed for comfort. Do not swallow this mixture. Mix 1 teaspoon of salt in 8 ounces of warm water for gargling.   Rest in bed as needed. Resume activities as able.   Apply cold to your neck for pain relief. Fill a plastic bag with ice and wrap it in a towel. Hold the ice on your neck for 20 minutes 4 times per day.   Eat a soft or liquid diet as tolerated while your throat remains sore. Popsicles and ice cream may be good early choices. Drinking plenty of cold fluids will  probably be soothing and help take swelling down in between the warm gargles.   Only take over-the-counter or prescription medicines for pain, discomfort, or fever as directed by your caregiver. Do not use aspirin unless directed by your physician. Aspirin slows down the clotting process. It can also cause bleeding from the drainage area if this was needled or incised today.   If antibiotics were prescribed, take them as directed for the full course of the prescription. Even if you feel you are well, you need to take them.  SEEK MEDICAL CARE IF:   You have increased pain, swelling, redness, or drainage in your throat.   You develop signs of infection such as dizziness, headache, lethargy, or generalized feelings of illness.   You have difficulty breathing, swallowing or eating.   You show signs of becoming dehydrated (lightheadedness when standing, decreased urine output, a fast heart rate, or dry mouth and mucous membranes).  SEEK IMMEDIATE MEDICAL CARE IF:   You have a fever.   You are coughing up or vomiting blood.   You develop more severe throat pain uncontrolled with medicines or you start to drool.   You develop difficulty breathing, talking, or find it easier to breathe while leaning  forward.  Document Released: 08/03/2005 Document Revised: 07/23/2011 Document Reviewed: 03/16/2008 [facility] Patient Information 2012 [place], [place].

## 2012-01-26 NOTE — ED Provider Notes (Signed)
History    This chart was scribed for Suzi Roots, MD, MD by Smitty Pluck. The patient was seen in room STRE3 and the patient's care was started at 11:14AM.   CSN: [id number]  Arrival date & time 01/26/12  1039   None     Chief Complaint  Patient presents with  . Sore Throat    (Consider location/radiation/quality/duration/timing/severity/associated sxs/prior treatment) The history is provided by the patient.   Robyn Ortiz is a 26 y.o. female who presents to the Emergency Department complaining of moderate sore throat onset 2 days ago. Pt reports having rhinorrhea and fever. Denies sick contact. Reports recurrent throat infections. Symptoms have been constant since onset. Denies cough. Eating and drinking aggravates the pain. Pain worse on left. Trouble eating and drinking due to pain/swelling. Is able to swallow, no trouble breathing. No known ill contacts, no known strep or mono exposure. No cough or sob. No severe headaches. No rash.   History reviewed. No pertinent past medical history.  History reviewed. No pertinent past surgical history.  History reviewed. No pertinent family history.  History  Substance Use Topics  . Smoking status: Current Everyday Smoker -- 1.0 packs/day    Types: Cigarettes  . Smokeless tobacco: Not on file  . Alcohol Use: Yes     occassional    OB History    Grav Para Term Preterm Abortions TAB SAB Ect Mult Living                  Review of Systems  Constitutional: Positive for fever.  HENT: Positive for sore throat and rhinorrhea.   Respiratory: Negative for cough and shortness of breath.   Gastrointestinal: Negative for nausea, vomiting and diarrhea.    Allergies  Codeine  Home Medications   Current Outpatient Rx  Name Route Sig Dispense Refill  . B-12 PO Oral Take 3 tablets by mouth daily.    Marland Kitchen LEVONORGEST-ETH ESTRAD 91-DAY 0.15-0.03 MG PO TABS Oral Take 1 tablet by mouth daily.      BP 110/78  Pulse 88  Temp(Src)  98.6 F (37 C) (Oral)  Resp 18  SpO2 100%  Physical Exam  Nursing note and vitals reviewed. Constitutional: She is oriented to person, place, and time. She appears well-developed and well-nourished. No distress.  HENT:  Head: Normocephalic and atraumatic.  Nose: Nose normal.       Pharynx erythematous, +tonsillar swelling and exudate, L > R. Ant cerv l/a. No post cerv l/a. Trachea midline.   Eyes: Conjunctivae are normal. Pupils are equal, round, and reactive to light. Right eye exhibits no discharge. Left eye exhibits no discharge. No scleral icterus.  Neck: Normal range of motion. Neck supple. No tracheal deviation present.  Cardiovascular: Normal rate, regular rhythm, normal heart sounds and intact distal pulses.  Exam reveals no friction rub.   No murmur heard. Pulmonary/Chest: Effort normal and breath sounds normal. No respiratory distress. She has no rales.  Abdominal: Soft. She exhibits no distension. There is no tenderness.       No hsm.   Musculoskeletal: Normal range of motion. She exhibits no edema and no tenderness.  Lymphadenopathy:    She has cervical adenopathy.  Neurological: She is alert and oriented to person, place, and time.  Skin: Skin is warm and dry. No rash noted.  Psychiatric: She has a normal mood and affect. Her behavior is normal.    ED Course  Procedures (including critical care time) DIAGNOSTIC STUDIES: Oxygen Saturation is 100%  on room air, normal by my interpretation.    COORDINATION OF CARE: 11:30AM EDP orders medication: NaCl infusion, rocephin 50 ml, decadron 10 mg  Results for orders placed during the hospital encounter of 01/26/12  CBC      Component Value Range   WBC 15.4 (*) 4.0 - 10.5 (K/uL)   RBC 4.23  3.87 - 5.11 (MIL/uL)   Hemoglobin 13.1  12.0 - 15.0 (g/dL)   HCT 16.1  09.6 - 04.5 (%)   MCV 90.8  78.0 - 100.0 (fL)   MCH 31.0  26.0 - 34.0 (pg)   MCHC 34.1  30.0 - 36.0 (g/dL)   RDW 40.9  81.1 - 91.4 (%)   Platelets 224  150 - 400  (K/uL)  RAPID STREP SCREEN      Component Value Range   Streptococcus, Group A Screen (Direct) POSITIVE (*) NEGATIVE   MONONUCLEOSIS SCREEN      Component Value Range   Mono Screen NEGATIVE  NEGATIVE         MDM  I personally performed the services described in this documentation, which was scribed in my presence. The recorded information has been reviewed and considered. Suzi Roots, MD   Pt notes poor po intake due to throat pain.   Iv ns bolus. Decadron iv. Rocephin iv. As asymmetric pain and swelling, ent called re concern for possible peritonsillar abscess - discussed w Dr Jearld Fenton - he will see in office now.  Recheck pt alert, content, no resp diff, no trouble swallowing, but painful to swallow.         Suzi Roots, MD 01/26/12 [phone]

## 2012-01-26 NOTE — ED Notes (Signed)
Pt c/o sore throat and enlarged glands x 3 days; pt sts some chills; pt sts painful to swallow

## 2012-02-19 ENCOUNTER — Other Ambulatory Visit: Payer: Self-pay | Admitting: Otolaryngology

## 2012-02-25 ENCOUNTER — Encounter (HOSPITAL_COMMUNITY): Payer: Self-pay | Admitting: Pharmacy Technician

## 2012-03-03 ENCOUNTER — Encounter (HOSPITAL_COMMUNITY): Payer: Self-pay

## 2012-03-03 ENCOUNTER — Encounter (HOSPITAL_COMMUNITY)
Admission: RE | Admit: 2012-03-03 | Discharge: 2012-03-03 | Disposition: A | Discharge status: Home / Self Care | Payer: Self-pay | Source: Ambulatory Visit | Attending: Otolaryngology | Admitting: Otolaryngology

## 2012-03-03 HISTORY — DX: Personal history of other diseases of the respiratory system: Z87.09

## 2012-03-03 HISTORY — DX: Migraine, unspecified, not intractable, without status migrainosus: G43.909

## 2012-03-03 LAB — SURGICAL PCR SCREEN
MRSA, PCR: NEGATIVE
Staphylococcus aureus: POSITIVE — AB

## 2012-03-03 LAB — HCG, SERUM, QUALITATIVE: Preg, Serum: NEGATIVE

## 2012-03-03 NOTE — Progress Notes (Signed)
Pt doesn't have a cardiologist  Denies ever having a heart cath/stress test/echo  Doesn't have a medical md  Denies any ekg or cxr

## 2012-03-03 NOTE — Pre-Procedure Instructions (Signed)
[address] Phoebe Perch  03/03/2012   Your procedure is scheduled on:  Thurs, July 25 @ 8:45 AM  Report to [facility] at 6:45 AM.  Call this number if you have problems the morning of surgery: [phone]   Remember:   Do not eat food:After Midnight.    Do not wear jewelry, make-up or nail polish.  Do not wear lotions, powders, or perfumes.   Do not shave 48 hours prior to surgery.   Do not bring valuables to the hospital.  Contacts, dentures or bridgework may not be worn into surgery.  Leave suitcase in the car. After surgery it may be brought to your room.  For patients admitted to the hospital, checkout time is 11:00 AM the day of discharge.   Patients discharged the day of surgery will not be allowed to drive home.  Special Instructions: CHG Shower Use Special Wash: 1/2 bottle night before surgery and 1/2 bottle morning of surgery.   Please read over the following fact sheets that you were given: Pain Booklet, Coughing and Deep Breathing, MRSA Information and Surgical Site Infection Prevention

## 2012-03-10 ENCOUNTER — Encounter (HOSPITAL_COMMUNITY)
Admission: RE | Disposition: A | Discharge status: Home / Self Care | Payer: Self-pay | Source: Ambulatory Visit | Attending: Otolaryngology

## 2012-03-10 ENCOUNTER — Encounter (HOSPITAL_COMMUNITY): Payer: Self-pay

## 2012-03-10 ENCOUNTER — Ambulatory Visit (HOSPITAL_COMMUNITY): Payer: Self-pay | Admitting: Anesthesiology

## 2012-03-10 ENCOUNTER — Encounter (HOSPITAL_COMMUNITY): Payer: Self-pay | Admitting: Anesthesiology

## 2012-03-10 ENCOUNTER — Ambulatory Visit (HOSPITAL_COMMUNITY)
Admission: RE | Admit: 2012-03-10 | Discharge: 2012-03-10 | Disposition: A | Discharge status: Home / Self Care | Payer: Self-pay | Source: Ambulatory Visit | Attending: Otolaryngology | Admitting: Otolaryngology

## 2012-03-10 DIAGNOSIS — G43909 Migraine, unspecified, not intractable, without status migrainosus: Secondary | ICD-10-CM | POA: Insufficient documentation

## 2012-03-10 DIAGNOSIS — J3501 Chronic tonsillitis: Secondary | ICD-10-CM | POA: Insufficient documentation

## 2012-03-10 DIAGNOSIS — F17200 Nicotine dependence, unspecified, uncomplicated: Secondary | ICD-10-CM | POA: Insufficient documentation

## 2012-03-10 HISTORY — PX: TONSILLECTOMY: SHX5217

## 2012-03-10 SURGERY — TONSILLECTOMY
Anesthesia: General | Site: Mouth | Laterality: Bilateral | Wound class: Clean Contaminated

## 2012-03-10 MED ORDER — KETOROLAC TROMETHAMINE 30 MG/ML IJ SOLN
INTRAMUSCULAR | Status: AC
  Filled 2012-03-10: qty 1

## 2012-03-10 MED ORDER — MEPERIDINE HCL 25 MG/ML IJ SOLN: 6.2500 mg | INTRAMUSCULAR | Status: DC | PRN

## 2012-03-10 MED ORDER — DEXAMETHASONE SODIUM PHOSPHATE 4 MG/ML IJ SOLN
INTRAMUSCULAR | Status: DC | PRN
  Administered 2012-03-10: 8 mg via INTRAVENOUS

## 2012-03-10 MED ORDER — PROMETHAZINE HCL 25 MG/ML IJ SOLN: 6.2500 mg | INTRAMUSCULAR | Status: DC | PRN

## 2012-03-10 MED ORDER — ONDANSETRON HCL 4 MG/2ML IJ SOLN
INTRAMUSCULAR | Status: DC | PRN
  Administered 2012-03-10: 4 mg via INTRAVENOUS

## 2012-03-10 MED ORDER — HYDROMORPHONE HCL PF 1 MG/ML IJ SOLN
INTRAMUSCULAR | Status: AC
  Filled 2012-03-10: qty 1

## 2012-03-10 MED ORDER — FENTANYL CITRATE 0.05 MG/ML IJ SOLN
INTRAMUSCULAR | Status: DC | PRN
  Administered 2012-03-10: 50 ug via INTRAVENOUS
  Administered 2012-03-10: 100 ug via INTRAVENOUS
  Administered 2012-03-10: 50 ug via INTRAVENOUS
  Administered 2012-03-10: 100 ug via INTRAVENOUS
  Administered 2012-03-10 (×2): 50 ug via INTRAVENOUS

## 2012-03-10 MED ORDER — HYDROCODONE-ACETAMINOPHEN 7.5-500 MG/15ML PO SOLN: 15.0000 mL | ORAL | Status: AC | PRN

## 2012-03-10 MED ORDER — PROPOFOL 10 MG/ML IV EMUL
INTRAVENOUS | Status: DC | PRN
  Administered 2012-03-10: 160 mg via INTRAVENOUS

## 2012-03-10 MED ORDER — SUCCINYLCHOLINE CHLORIDE 20 MG/ML IJ SOLN
INTRAMUSCULAR | Status: DC | PRN
  Administered 2012-03-10: 100 mg via INTRAVENOUS

## 2012-03-10 MED ORDER — SODIUM CHLORIDE 0.9 % IR SOLN
Status: DC | PRN
  Administered 2012-03-10: 1

## 2012-03-10 MED ORDER — HYDROMORPHONE HCL PF 1 MG/ML IJ SOLN
0.2500 mg | INTRAMUSCULAR | Status: DC | PRN
  Administered 2012-03-10 (×2): 0.5 mg via INTRAVENOUS

## 2012-03-10 MED ORDER — KETOROLAC TROMETHAMINE 30 MG/ML IJ SOLN
15.0000 mg | Freq: Once | INTRAMUSCULAR | Status: AC | PRN
  Administered 2012-03-10: 30 mg via INTRAVENOUS

## 2012-03-10 MED ORDER — LACTATED RINGERS IV SOLN
INTRAVENOUS | Status: DC
  Administered 2012-03-10: 09:00:00 via INTRAVENOUS

## 2012-03-10 MED ORDER — LIDOCAINE HCL 1 % IJ SOLN
INTRAMUSCULAR | Status: DC | PRN
  Administered 2012-03-10: 80 mg via INTRADERMAL

## 2012-03-10 MED ORDER — LACTATED RINGERS IV SOLN
INTRAVENOUS | Status: DC | PRN
  Administered 2012-03-10: 09:00:00 via INTRAVENOUS

## 2012-03-10 MED ORDER — MIDAZOLAM HCL 5 MG/5ML IJ SOLN
INTRAMUSCULAR | Status: DC | PRN
  Administered 2012-03-10: 2 mg via INTRAVENOUS

## 2012-03-10 SURGICAL SUPPLY — 31 items
CANISTER SUCTION 2500CC (MISCELLANEOUS) ×2 IMPLANT
CATH ROBINSON RED A/P 10FR (CATHETERS) IMPLANT
CLEANER TIP ELECTROSURG 2X2 (MISCELLANEOUS) ×2 IMPLANT
CLOTH BEACON ORANGE TIMEOUT ST (SAFETY) ×2 IMPLANT
COAGULATOR SUCT SWTCH 10FR 6 (ELECTROSURGICAL) ×2 IMPLANT
CRADLE DONUT ADULT HEAD (MISCELLANEOUS) IMPLANT
ELECT COATED BLADE 2.86 ST (ELECTRODE) ×2 IMPLANT
ELECT REM PT RETURN 9FT ADLT (ELECTROSURGICAL) ×2
ELECT REM PT RETURN 9FT PED (ELECTROSURGICAL)
ELECTRODE REM PT RETRN 9FT PED (ELECTROSURGICAL) IMPLANT
ELECTRODE REM PT RTRN 9FT ADLT (ELECTROSURGICAL) IMPLANT
GAUZE SPONGE 4X4 16PLY XRAY LF (GAUZE/BANDAGES/DRESSINGS) ×1 IMPLANT
GLOVE SS BIOGEL STRL SZ 7.5 (GLOVE) ×1 IMPLANT
GLOVE SUPERSENSE BIOGEL SZ 7.5 (GLOVE) ×1
GOWN STRL NON-REIN LRG LVL3 (GOWN DISPOSABLE) ×5 IMPLANT
KIT BASIN OR (CUSTOM PROCEDURE TRAY) ×2 IMPLANT
KIT ROOM TURNOVER OR (KITS) ×2 IMPLANT
NS IRRIG 1000ML POUR BTL (IV SOLUTION) ×2 IMPLANT
PACK SURGICAL SETUP 50X90 (CUSTOM PROCEDURE TRAY) ×2 IMPLANT
PAD ARMBOARD 7.5X6 YLW CONV (MISCELLANEOUS) ×4 IMPLANT
PENCIL FOOT CONTROL (ELECTRODE) ×2 IMPLANT
SPECIMEN JAR SMALL (MISCELLANEOUS) ×4 IMPLANT
SPONGE TONSIL 1 RF SGL (DISPOSABLE) ×2 IMPLANT
SYR BULB 3OZ (MISCELLANEOUS) ×2 IMPLANT
TOWEL OR 17X24 6PK STRL BLUE (TOWEL DISPOSABLE) ×4 IMPLANT
TUBE CONNECTING 12X1/4 (SUCTIONS) ×2 IMPLANT
TUBE SALEM SUMP 10F W/ARV (TUBING) IMPLANT
TUBE SALEM SUMP 12R W/ARV (TUBING) IMPLANT
TUBE SALEM SUMP 14F W/ARV (TUBING) IMPLANT
TUBE SALEM SUMP 16 FR W/ARV (TUBING) IMPLANT
WATER STERILE IRR 1000ML POUR (IV SOLUTION) ×1 IMPLANT

## 2012-03-10 NOTE — H&P (Signed)
Robyn Ortiz is an 26 y.o. female.   Chief Complaint: Chronic tonsillitis HPI: 26 year old here for treatment for chronic tonsillitis that has been refractory to medical therapy.  Past Medical History  Diagnosis Date  . History of bronchitis 22yrs ago  . Migraine     last one a week ago    No past surgical history on file.  No family history on file. Social History:  reports that she has been smoking Cigarettes.  She has a 6.5 pack-year smoking history. She does not have any smokeless tobacco history on file. She reports that she drinks alcohol. She reports that she does not use illicit drugs.  Allergies:  Allergies  Allergen Reactions  . Codeine Other (See Comments)    Pt. States she had a seizure    Medications Prior to Admission  Medication Sig Dispense Refill  . ibuprofen (ADVIL,MOTRIN) 200 MG tablet Take 800 mg by mouth every 6 (six) hours as needed. For pain      . pseudoephedrine-acetaminophen (TYLENOL SINUS) 30-500 MG TABS Take 2 tablets by mouth every 4 (four) hours as needed.      Marland Kitchen levonorgestrel-ethinyl estradiol (SEASONALE,INTROVALE,JOLESSA) 0.15-0.03 MG tablet Take 1 tablet by mouth daily.        No results found for this or any previous visit (from the past 48 hour(s)). No results found.  Review of Systems  Constitutional: Negative.   HENT: Negative.   Eyes: Negative.   Respiratory: Negative.   Cardiovascular: Negative.   Skin: Negative.     Blood pressure 134/78, pulse 62, temperature 97.8 F (36.6 C), temperature source Oral, resp. rate 18, last menstrual period 03/06/2012, SpO2 99.00%. Physical Exam  Constitutional: She appears well-nourished.  HENT:  Nose: Nose normal.  Mouth/Throat: Oropharynx is clear and moist.  Eyes: Pupils are equal, round, and reactive to light.  Neck: Normal range of motion.  Cardiovascular: Normal rate.   Respiratory: Effort normal.     Assessment/Plan Chronic tonsillitis-she's here for removal of the tonsils. The  procedure has been discussed. She is ready to proceed.  Suzanna Obey 03/10/2012, 8:12 AM

## 2012-03-10 NOTE — Progress Notes (Signed)
Call to Dr. Lodema Hong, reported ^ wbc's in ER one month ago, treated ./w antibiotic & pt. Reports taking full Rx. No need for CBC today.

## 2012-03-10 NOTE — Anesthesia Preprocedure Evaluation (Addendum)
Anesthesia Evaluation  Patient identified by MRN, date of birth, ID band Patient awake    Reviewed: Allergy & Precautions, H&P , NPO status , Patient's Chart, lab work & pertinent test results  History of Anesthesia Complications Negative for: history of anesthetic complications  Airway Mallampati: I TM Distance: >3 FB Neck ROM: Full    Dental  (+) Teeth Intact and Dental Advisory Given   Pulmonary Current Smoker,  breath sounds clear to auscultation        Cardiovascular negative cardio ROS  Rhythm:Regular Rate:Normal     Neuro/Psych  Headaches, negative psych ROS   GI/Hepatic negative GI ROS, Neg liver ROS,   Endo/Other  negative endocrine ROS  Renal/GU negative Renal ROS     Musculoskeletal negative musculoskeletal ROS (+)   Abdominal (+) + obese,   Peds  Hematology negative hematology ROS (+)   Anesthesia Other Findings   Reproductive/Obstetrics negative OB ROS                          Anesthesia Physical Anesthesia Plan  ASA: I  Anesthesia Plan: General   Post-op Pain Management:    Induction: Intravenous  Airway Management Planned: Oral ETT  Additional Equipment:   Intra-op Plan:   Post-operative Plan: Extubation in OR  Informed Consent: I have reviewed the patients History and Physical, chart, labs and discussed the procedure including the risks, benefits and alternatives for the proposed anesthesia with the patient or authorized representative who has indicated his/her understanding and acceptance.   Dental advisory given  Plan Discussed with: CRNA and Surgeon  Anesthesia Plan Comments:         Anesthesia Quick Evaluation

## 2012-03-10 NOTE — Anesthesia Postprocedure Evaluation (Signed)
  Anesthesia Post-op Note  Patient: Robyn Ortiz  Procedure(s) Performed: Procedure(s) (LRB): TONSILLECTOMY (Bilateral)  Patient Location: PACU  Anesthesia Type: General  Level of Consciousness: awake  Airway and Oxygen Therapy: Patient Spontanous Breathing  Post-op Pain: mild  Post-op Assessment: Post-op Vital signs reviewed  Post-op Vital Signs: stable  Complications: No apparent anesthesia complications

## 2012-03-10 NOTE — Op Note (Signed)
Preop/postop diagnosis: Chronic tonsillitis Procedure: Tonsillectomy Anesthesia: Gen. Estimated blood loss: Less than 76 cc 26 year old with recurrent problems and persistent issues with tonsils. She has now reached the point where she is ready to proceed with tonsillectomy. She was informed the risks, benefits, and options. All her questions were answered and consent was obtained. Operation: Patient was taken to the operating room placed in the supine position after general endotracheal tube anesthesia was placed in the rose position. The Crowe-Davis mouth gag inserted retracted and suspended from the Mayo stand. The left tonsil was identified and grasped with identification of the superior pole capsule. Electrocautery dissection was begun along its capsule and it was removed. The right tonsil removed in the same fashion. The oral cavity oropharynx is irrigated with saline. The Crowe-Davis was released and resuspended hemostasis present in all locations. The suction cautery was used to gain control of small areas of bleeding. The Crowe-Davis was removed and patient was taken the operating room in stable condition. Counts correct.

## 2012-03-10 NOTE — Progress Notes (Signed)
Pt. Reports that she started /w runny nose & cough since PAT appt. Pt. States she doesn't feel at all like she felt a month ago when she was taken to ER & had +strep.  Pt. Still reporting a "little dry cough" & some minor runny nose. Temp. wnl.

## 2012-03-10 NOTE — Preoperative (Signed)
Beta Blockers   Reason not to administer Beta Blockers:Not Applicable 

## 2012-03-10 NOTE — Transfer of Care (Signed)
Immediate Anesthesia Transfer of Care Note  Patient: Robyn Ortiz  Procedure(s) Performed: Procedure(s) (LRB): TONSILLECTOMY (Bilateral)  Patient Location: PACU  Anesthesia Type: General  Level of Consciousness: awake, alert , oriented and patient cooperative  Airway & Oxygen Therapy: Patient Spontanous Breathing and Patient connected to nasal cannula oxygen  Post-op Assessment: Report given to PACU RN and Post -op Vital signs reviewed and stable  Post vital signs: Reviewed and stable  Complications: No apparent anesthesia complications

## 2012-03-11 ENCOUNTER — Encounter (HOSPITAL_COMMUNITY): Payer: Self-pay | Admitting: Otolaryngology

## 2012-07-16 ENCOUNTER — Encounter (HOSPITAL_COMMUNITY): Payer: Self-pay | Admitting: Emergency Medicine

## 2012-07-16 ENCOUNTER — Emergency Department (HOSPITAL_COMMUNITY)
Admission: EM | Admit: 2012-07-16 | Discharge: 2012-07-16 | Disposition: A | Discharge status: Home / Self Care | Payer: Self-pay | Attending: Emergency Medicine | Admitting: Emergency Medicine

## 2012-07-16 DIAGNOSIS — K2970 Gastritis, unspecified, without bleeding: Secondary | ICD-10-CM | POA: Insufficient documentation

## 2012-07-16 DIAGNOSIS — R1110 Vomiting, unspecified: Secondary | ICD-10-CM | POA: Insufficient documentation

## 2012-07-16 DIAGNOSIS — K2990 Gastroduodenitis, unspecified, without bleeding: Secondary | ICD-10-CM | POA: Insufficient documentation

## 2012-07-16 DIAGNOSIS — F17200 Nicotine dependence, unspecified, uncomplicated: Secondary | ICD-10-CM | POA: Insufficient documentation

## 2012-07-16 DIAGNOSIS — Z8709 Personal history of other diseases of the respiratory system: Secondary | ICD-10-CM | POA: Insufficient documentation

## 2012-07-16 DIAGNOSIS — G43909 Migraine, unspecified, not intractable, without status migrainosus: Secondary | ICD-10-CM | POA: Insufficient documentation

## 2012-07-16 DIAGNOSIS — Z79899 Other long term (current) drug therapy: Secondary | ICD-10-CM | POA: Insufficient documentation

## 2012-07-16 LAB — CBC WITH DIFFERENTIAL/PLATELET
Basophils Absolute: 0 10*3/uL (ref 0.0–0.1)
HCT: 40.9 % (ref 36.0–46.0)
Hemoglobin: 14 g/dL (ref 12.0–15.0)
Lymphocytes Relative: 24 % (ref 12–46)
Lymphs Abs: 2.2 10*3/uL (ref 0.7–4.0)
Monocytes Absolute: 0.5 10*3/uL (ref 0.1–1.0)
Neutro Abs: 6.3 10*3/uL (ref 1.7–7.7)
RBC: 4.44 MIL/uL (ref 3.87–5.11)
RDW: 12 % (ref 11.5–15.5)
WBC: 9.3 10*3/uL (ref 4.0–10.5)

## 2012-07-16 LAB — BASIC METABOLIC PANEL
CO2: 25 mEq/L (ref 19–32)
Chloride: 103 mEq/L (ref 96–112)
Creatinine, Ser: 0.66 mg/dL (ref 0.50–1.10)

## 2012-07-16 LAB — LIPASE, BLOOD: Lipase: 29 U/L (ref 11–59)

## 2012-07-16 MED ORDER — SUCRALFATE 1 G PO TABS
1.0000 g | ORAL_TABLET | ORAL | Status: AC
  Administered 2012-07-16: 1 g via ORAL
  Filled 2012-07-16: qty 1

## 2012-07-16 MED ORDER — PANTOPRAZOLE SODIUM 40 MG PO TBEC
40.0000 mg | DELAYED_RELEASE_TABLET | Freq: Every day | ORAL | Status: DC
  Administered 2012-07-16: 40 mg via ORAL
  Filled 2012-07-16: qty 1

## 2012-07-16 MED ORDER — ALUM & MAG HYDROXIDE-SIMETH 200-200-20 MG/5ML PO SUSP
30.0000 mL | Freq: Once | ORAL | Status: AC
  Administered 2012-07-16: 30 mL via ORAL
  Filled 2012-07-16: qty 30

## 2012-07-16 MED ORDER — SUCRALFATE 1 G PO TABS: 1.0000 g | ORAL_TABLET | Freq: Four times a day (QID) | ORAL | Status: DC

## 2012-07-16 MED ORDER — OMEPRAZOLE 20 MG PO CPDR: 20.0000 mg | DELAYED_RELEASE_CAPSULE | Freq: Every day | ORAL | Status: DC

## 2012-07-16 NOTE — ED Provider Notes (Signed)
I saw and evaluated the patient, reviewed the resident's note and I agree with the findings and plan.  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 Labs are without pancreatitis. Will place on Carafate and discharge.  Toy Baker, MD 07/16/12 [phone]

## 2012-07-16 NOTE — ED Notes (Signed)
Med Res Robyn Ortiz at bedside.

## 2012-07-16 NOTE — ED Provider Notes (Signed)
History     CSN: [id number]  Arrival date & time 07/16/12  1418   First MD Initiated Contact with Patient 07/16/12 1659      Chief Complaint  Patient presents with  . Abdominal Pain  . Emesis    (Consider location/radiation/quality/duration/timing/severity/associated sxs/prior treatment) Patient is a 26 y.o. female presenting with abdominal pain and vomiting. The history is provided by the patient.  Abdominal Pain The primary symptoms of the illness include abdominal pain and vomiting. The primary symptoms of the illness do not include shortness of breath. The current episode started more than 2 days ago. The onset of the illness was gradual. The problem has not changed since onset. The abdominal pain began more than 2 days ago. The pain came on gradually. The abdominal pain has been unchanged since its onset. The abdominal pain is located in the epigastric region. The abdominal pain radiates to the back. The severity of the abdominal pain is 5/10. The abdominal pain is relieved by nothing. The abdominal pain is exacerbated by eating.  The vomiting began yesterday. Vomiting occurs 2 to 5 times per day. The emesis contains stomach contents.  The illness is associated with NSAID use. The patient states that she believes she is currently not pregnant. Symptoms associated with the illness do not include chills, heartburn or constipation.  Emesis  Associated symptoms include abdominal pain. Pertinent negatives include no chills and no cough.    Past Medical History  Diagnosis Date  . History of bronchitis 30yrs ago  . Migraine     last one a week ago    Past Surgical History  Procedure Date  . Tonsillectomy 03/10/2012    Procedure: TONSILLECTOMY;  Surgeon: Suzanna Obey, MD;  Location: [facility] OR;  Service: ENT;  Laterality: Bilateral;    History reviewed. No pertinent family history.  History  Substance Use Topics  . Smoking status: Current Every Day Smoker -- 0.5 packs/day for 13 years     Types: Cigarettes  . Smokeless tobacco: Not on file  . Alcohol Use: Yes     Comment: occassional    OB History    Grav Para Term Preterm Abortions TAB SAB Ect Mult Living                  Review of Systems  Constitutional: Negative for chills.  Respiratory: Negative for cough and shortness of breath.   Gastrointestinal: Positive for vomiting and abdominal pain. Negative for heartburn and constipation.  All other systems reviewed and are negative.    Allergies  Codeine  Home Medications   Current Outpatient Rx  Name  Route  Sig  Dispense  Refill  . IBUPROFEN 200 MG PO TABS   Oral   Take 800 mg by mouth every 6 (six) hours as needed. For pain         . LEVONORGEST-ETH ESTRAD 91-DAY 0.15-0.03 MG PO TABS   Oral   Take 1 tablet by mouth daily.         . TRIAMCINOLONE ACETONIDE 0.1 % EX CREA   Topical   Apply 1 application topically 2 (two) times daily.           BP 101/70  Pulse 71  Temp 97.6 F (36.4 C) (Oral)  Resp 18  SpO2 98%  Physical Exam  Nursing note and vitals reviewed. Constitutional: She is oriented to person, place, and time. She appears well-developed and well-nourished. No distress.  HENT:  Head: Normocephalic and atraumatic.  Eyes: EOM are  normal. Pupils are equal, round, and reactive to light.  Neck: Normal range of motion. Neck supple.  Cardiovascular: Normal rate and regular rhythm.  Exam reveals no friction rub.   No murmur heard. Pulmonary/Chest: Effort normal and breath sounds normal. No respiratory distress. She has no wheezes. She has no rales.  Abdominal: Soft. She exhibits no distension. There is tenderness (epigastric). There is no rebound.  Musculoskeletal: Normal range of motion. She exhibits no edema.  Neurological: She is alert and oriented to person, place, and time.  Skin: She is not diaphoretic.    ED Course  Procedures (including critical care time)   Labs Reviewed  CBC WITH DIFFERENTIAL  BASIC METABOLIC  PANEL  LIPASE, BLOOD  URINALYSIS, ROUTINE W REFLEX MICROSCOPIC   No results found.   1. Gastritis       MDM   Patient is a 26 year old female with history of headaches who comes in with epigastric pain. Present for 3-4 days. Some nausea and occasional vomiting. No alleviating or exacerbating factors. The pain is intermittent and usually worse when she's sleeping when she is supine. Patient states she takes NSAIDs every day sometimes multiple times a day for her headaches. She denies fevers and diarrhea. No other abdominal pain. No urinary or vaginal complaints. Vitals are stable here. Mild epigastric tenderness on palpation. No marked right upper quadrant tenderness. The patient's symptoms are consistent with gastritis due to NSAID use. Patient given Maalox and a PPI. I will discharge patient with a PPI prescription for 2 weeks. Patient given Carafate, Maalox, PPI here.        Elwin Mocha, MD 07/16/12 [phone]

## 2012-07-16 NOTE — ED Notes (Signed)
Pt c/o upper abd pain and vomiting x 3 days

## 2012-07-17 NOTE — ED Provider Notes (Signed)
I saw and evaluated the patient, reviewed the resident's note and I agree with the findings and plan.  Slyvester Latona T Geneve Kimpel, MD 07/17/12 2220 

## 2012-08-17 NOTE — L&D Delivery Note (Signed)
I was in attendance of this birth and agree with resident's note.  We were called app prior to birth d/t repetitive deep prolonged decels. Pitocin had previously been d/c'd, O2 was in place, and she was receiving fluid bolus.  Pt was noted to have an anterior lip that was reduced, and pt began to push, but was not effectively moving fetal head, so Dr. Jolayne Panther was also called and she quickly came.  Pt's pushing efforts became more effective and pt delivered w/o incident.  Cheral Marker, CNM, [facility] 06/19/2013 9:27 AM

## 2012-08-17 NOTE — L&D Delivery Note (Signed)
Delivery Note At 11:39 PM a viable female was delivered via Vaginal, Spontaneous Delivery (Presentation: Left Occiput Anterior).  APGAR: 8, 9; weight 5 lb 15.8 oz (2716 g).   Placenta status: Intact, Spontaneous.  Cord: 3 vessels with the following complications: None.   Anesthesia: Epidural  Episiotomy: None Lacerations: Periurethral Suture Repair: n/a Est. Blood Loss (mL): 350  Mother pushed for 20 minutes to deliver viable female infant over intact perineum. No nuchal cord. Shoulders and body delivered without difficulty or delay. Baby crying at time of delivery and placed on maternal abdomen. Cord clamping delayed until cessation of umbilical cord pulse. Cord then clamped and cut by FOB.  Third stage of labor actively managed with gentle cord traction and Oxytocin. Placenta delivered intact with 3 vessel cord. Periurethral tear present however it was hemostatic and no repair was necessary. Mother and infant stable at conclusion of delivery. Counts correct and verified. Mom to postpartum. Infant to Skin to Skin.   Delivery performed by me under direct supervision of Joellyn Haff, CNM   Yosselyn Tax 06/18/2013, 2:33 AM

## 2012-08-26 ENCOUNTER — Encounter (HOSPITAL_COMMUNITY): Payer: Self-pay | Admitting: Emergency Medicine

## 2012-08-26 ENCOUNTER — Emergency Department (HOSPITAL_COMMUNITY): Payer: Self-pay

## 2012-08-26 ENCOUNTER — Emergency Department (HOSPITAL_COMMUNITY)
Admission: EM | Admit: 2012-08-26 | Discharge: 2012-08-26 | Disposition: A | Discharge status: Home / Self Care | Payer: Self-pay | Attending: Emergency Medicine | Admitting: Emergency Medicine

## 2012-08-26 DIAGNOSIS — R0602 Shortness of breath: Secondary | ICD-10-CM | POA: Insufficient documentation

## 2012-08-26 DIAGNOSIS — Z8709 Personal history of other diseases of the respiratory system: Secondary | ICD-10-CM | POA: Insufficient documentation

## 2012-08-26 DIAGNOSIS — J069 Acute upper respiratory infection, unspecified: Secondary | ICD-10-CM | POA: Insufficient documentation

## 2012-08-26 DIAGNOSIS — R0982 Postnasal drip: Secondary | ICD-10-CM | POA: Insufficient documentation

## 2012-08-26 DIAGNOSIS — Z8679 Personal history of other diseases of the circulatory system: Secondary | ICD-10-CM | POA: Insufficient documentation

## 2012-08-26 DIAGNOSIS — J029 Acute pharyngitis, unspecified: Secondary | ICD-10-CM | POA: Insufficient documentation

## 2012-08-26 DIAGNOSIS — R51 Headache: Secondary | ICD-10-CM | POA: Insufficient documentation

## 2012-08-26 DIAGNOSIS — R5381 Other malaise: Secondary | ICD-10-CM | POA: Insufficient documentation

## 2012-08-26 DIAGNOSIS — R05 Cough: Secondary | ICD-10-CM | POA: Insufficient documentation

## 2012-08-26 DIAGNOSIS — Z79899 Other long term (current) drug therapy: Secondary | ICD-10-CM | POA: Insufficient documentation

## 2012-08-26 DIAGNOSIS — R110 Nausea: Secondary | ICD-10-CM | POA: Insufficient documentation

## 2012-08-26 DIAGNOSIS — F17200 Nicotine dependence, unspecified, uncomplicated: Secondary | ICD-10-CM | POA: Insufficient documentation

## 2012-08-26 DIAGNOSIS — R6883 Chills (without fever): Secondary | ICD-10-CM | POA: Insufficient documentation

## 2012-08-26 DIAGNOSIS — R059 Cough, unspecified: Secondary | ICD-10-CM | POA: Insufficient documentation

## 2012-08-26 MED ORDER — ALBUTEROL SULFATE (5 MG/ML) 0.5% IN NEBU
5.0000 mg | INHALATION_SOLUTION | Freq: Once | RESPIRATORY_TRACT | Status: AC
  Administered 2012-08-26: 5 mg via RESPIRATORY_TRACT
  Filled 2012-08-26: qty 1

## 2012-08-26 MED ORDER — IBUPROFEN 400 MG PO TABS
800.0000 mg | ORAL_TABLET | Freq: Once | ORAL | Status: AC
  Administered 2012-08-26: 800 mg via ORAL
  Filled 2012-08-26: qty 2

## 2012-08-26 MED ORDER — ALBUTEROL SULFATE HFA 108 (90 BASE) MCG/ACT IN AERS: 2.0000 | INHALATION_SPRAY | RESPIRATORY_TRACT | Status: DC | PRN

## 2012-08-26 MED ORDER — ONDANSETRON 4 MG PO TBDP
4.0000 mg | ORAL_TABLET | Freq: Once | ORAL | Status: AC
  Administered 2012-08-26: 4 mg via ORAL
  Filled 2012-08-26: qty 1

## 2012-08-26 MED ORDER — IPRATROPIUM BROMIDE 0.03 % NA SOLN: 2.0000 | Freq: Two times a day (BID) | NASAL | Status: DC

## 2012-08-26 MED ORDER — GUAIFENESIN ER 600 MG PO TB12: 1200.0000 mg | ORAL_TABLET | Freq: Two times a day (BID) | ORAL | Status: DC

## 2012-08-26 MED ORDER — BENZONATATE 100 MG PO CAPS: 200.0000 mg | ORAL_CAPSULE | Freq: Two times a day (BID) | ORAL | Status: DC | PRN

## 2012-08-26 NOTE — ED Provider Notes (Signed)
History     CSN: [id number]  Arrival date & time 08/26/12  1055   First MD Initiated Contact with Patient 08/26/12 1124      No chief complaint on file.   (Consider location/radiation/quality/duration/timing/severity/associated sxs/prior treatment) The history is provided by the patient and medical records.    Robyn Ortiz is a 27 y.o. female  with a hx of bronchitis, migraine presents to the Emergency Department complaining of gradual, persistent, progressively worsening sore throat, congestion onset 2 days ago. Pt states her children are sick with the same symptoms. She did not get a flu shot.  Associated symptoms include headache, rhinorrhea, sinus congestion, sore throat, otalgia, productive cough, myalgias.  Pt has taken tylenol without relief.  Nothing makes it better and nothing makes it worse.  Pt denies fever, neck pain, neck stiffness, chest pain, abdominal pain, vomiting, diarrhea, weakness, syncope.      Past Medical History  Diagnosis Date  . History of bronchitis 67yrs ago  . Migraine     last one a week ago    Past Surgical History  Procedure Date  . Tonsillectomy 03/10/2012    Procedure: TONSILLECTOMY;  Surgeon: Suzanna Obey, MD;  Location: [facility] OR;  Service: ENT;  Laterality: Bilateral;    No family history on file.  History  Substance Use Topics  . Smoking status: Current Every Day Smoker -- 0.5 packs/day for 13 years    Types: Cigarettes  . Smokeless tobacco: Not on file  . Alcohol Use: Yes     Comment: occassional    OB History    Grav Para Term Preterm Abortions TAB SAB Ect Mult Living                  Review of Systems  Constitutional: Positive for chills and fatigue. Negative for fever and appetite change.  HENT: Positive for congestion, sore throat, rhinorrhea, postnasal drip and sinus pressure. Negative for mouth sores and neck stiffness.   Eyes: Negative for visual disturbance.  Respiratory: Positive for cough and shortness of breath.  Negative for chest tightness, wheezing and stridor.   Cardiovascular: Negative for chest pain, palpitations and leg swelling.  Gastrointestinal: Positive for nausea. Negative for vomiting, abdominal pain and diarrhea.  Genitourinary: Negative for dysuria, urgency, frequency and hematuria.  Musculoskeletal: Negative for myalgias, back pain and arthralgias.  Skin: Negative for rash.  Neurological: Positive for headaches. Negative for syncope, light-headedness and numbness.  Hematological: Negative for adenopathy.  Psychiatric/Behavioral: The patient is not nervous/anxious.   All other systems reviewed and are negative.    Allergies  Codeine  Home Medications   Current Outpatient Rx  Name  Route  Sig  Dispense  Refill  . ACETAMINOPHEN 500 MG PO TABS   Oral   Take 1,000 mg by mouth every 6 (six) hours as needed. For pain         . ALBUTEROL SULFATE HFA 108 (90 BASE) MCG/ACT IN AERS   Inhalation   Inhale 2 puffs into the lungs every 4 (four) hours as needed for wheezing or shortness of breath.   1 Inhaler   3   . BENZONATATE 100 MG PO CAPS   Oral   Take 2 capsules (200 mg total) by mouth 2 (two) times daily as needed for cough.   20 capsule   0   . GUAIFENESIN ER 600 MG PO TB12   Oral   Take 2 tablets (1,200 mg total) by mouth 2 (two) times daily.   20  tablet   0   . IPRATROPIUM BROMIDE 0.03 % NA SOLN   Nasal   Place 2 sprays into the nose 2 (two) times daily. PRN congestion   30 mL   0     BP 118/66  Pulse 92  Temp 98 F (36.7 C) (Oral)  Resp 24  SpO2 98%  LMP 08/08/2012  Physical Exam  Constitutional: She is oriented to person, place, and time. She appears well-developed and well-nourished. No distress.  HENT:  Head: Normocephalic and atraumatic.  Right Ear: Tympanic membrane, external ear and ear canal normal.  Left Ear: Tympanic membrane, external ear and ear canal normal.  Nose: Mucosal edema and rhinorrhea present. No epistaxis. Right sinus exhibits  no maxillary sinus tenderness and no frontal sinus tenderness. Left sinus exhibits no maxillary sinus tenderness and no frontal sinus tenderness.  Mouth/Throat: Uvula is midline and mucous membranes are normal. Mucous membranes are not pale and not cyanotic. Posterior oropharyngeal erythema present. No oropharyngeal exudate, posterior oropharyngeal edema or tonsillar abscesses.  Eyes: Conjunctivae normal and EOM are normal. Pupils are equal, round, and reactive to light.  Neck: Normal range of motion and full passive range of motion without pain.  Cardiovascular: Normal rate, regular rhythm, normal heart sounds and intact distal pulses.  Exam reveals no gallop and no friction rub.   No murmur heard. Pulmonary/Chest: No accessory muscle usage or stridor. Tachypnea noted. No respiratory distress. She has decreased breath sounds (throughout). She has no wheezes. She has rhonchi (scattered throughout). She has no rales. She exhibits no tenderness.  Abdominal: Soft. Bowel sounds are normal. She exhibits no distension and no mass. There is no tenderness. There is no rebound and no guarding.  Musculoskeletal: Normal range of motion. She exhibits no tenderness.  Lymphadenopathy:    She has no cervical adenopathy.  Neurological: She is alert and oriented to person, place, and time. She exhibits normal muscle tone. Coordination normal.  Skin: Skin is warm and dry. No rash noted. She is not diaphoretic.  Psychiatric: She has a normal mood and affect.    ED Course  Procedures (including critical care time)  Labs Reviewed - No data to display Dg Chest 2 View  08/26/2012  *RADIOLOGY REPORT*  Clinical Data: Productive cough, lower chest pain and upper back pain, nausea, and chills.  2 ppd smoker X 13 yrs.  CHEST - 2 VIEW  Comparison: 02/09/2011  Findings: Low lung volumes are present, causing crowding of the pulmonary vasculature.  Cardiac and mediastinal contours appear normal.  The lungs appear clear.  No  pleural effusion is identified.  IMPRESSION:  No significant abnormality identified.   Original Report Authenticated By: Gaylyn Rong, M.D.      1. Viral URI with cough       MDM  Zynia Phoebe Ortiz presents with URI symptoms and rhonchi on exam.  Pt CXR negative for acute infiltrate. Patients symptoms are consistent with URI, likely viral etiology. Patient with improvement of breath sounds after albuterol treatment. Discussed that antibiotics are not indicated for viral infections. Pt will be discharged with symptomatic treatment.  Verbalizes understanding and is agreeable with plan. Pt is hemodynamically stable & in NAD prior to dc.  1. Medications: atrovent, mucinex, albuterol mdi, hycodan, usual home medications 2. Treatment: rest, drink plenty of fluids, take medications as prescribed, ibuprofen for muscle aches and fever 3. Follow Up: Please followup with your primary doctor for discussion of your diagnoses and further evaluation after today's visit; if you do  not have a primary care doctor use the resource guide provided to find one;         Dierdre Forth, PA-C 08/26/12 1302

## 2012-08-26 NOTE — ED Notes (Signed)
Pt states that her kids are sick and family member has pneu

## 2012-08-26 NOTE — ED Notes (Signed)
Cough core throat and runny nose x 2 days ears hurt head feels full

## 2012-08-26 NOTE — ED Provider Notes (Signed)
Medical screening examination/treatment/procedure(s) were performed by non-physician practitioner and as supervising physician I was immediately available for consultation/collaboration.   Glynn Octave, MD 08/26/12 1745

## 2012-08-26 NOTE — ED Notes (Signed)
C/O sorethroat, productive cough, bodyaches x 4 days. Did NOT get flu shot this year.

## 2012-08-26 NOTE — ED Notes (Signed)
Patient transported to X-ray 

## 2012-08-31 ENCOUNTER — Encounter (HOSPITAL_COMMUNITY): Payer: Self-pay | Admitting: Emergency Medicine

## 2012-08-31 ENCOUNTER — Emergency Department (HOSPITAL_COMMUNITY)
Admission: EM | Admit: 2012-08-31 | Discharge: 2012-08-31 | Disposition: A | Discharge status: Home / Self Care | Payer: Self-pay | Attending: Emergency Medicine | Admitting: Emergency Medicine

## 2012-08-31 DIAGNOSIS — G43909 Migraine, unspecified, not intractable, without status migrainosus: Secondary | ICD-10-CM | POA: Insufficient documentation

## 2012-08-31 DIAGNOSIS — F17200 Nicotine dependence, unspecified, uncomplicated: Secondary | ICD-10-CM | POA: Insufficient documentation

## 2012-08-31 DIAGNOSIS — B349 Viral infection, unspecified: Secondary | ICD-10-CM

## 2012-08-31 DIAGNOSIS — Z79899 Other long term (current) drug therapy: Secondary | ICD-10-CM | POA: Insufficient documentation

## 2012-08-31 DIAGNOSIS — R059 Cough, unspecified: Secondary | ICD-10-CM | POA: Insufficient documentation

## 2012-08-31 DIAGNOSIS — M2550 Pain in unspecified joint: Secondary | ICD-10-CM | POA: Insufficient documentation

## 2012-08-31 DIAGNOSIS — R109 Unspecified abdominal pain: Secondary | ICD-10-CM | POA: Insufficient documentation

## 2012-08-31 DIAGNOSIS — R05 Cough: Secondary | ICD-10-CM | POA: Insufficient documentation

## 2012-08-31 DIAGNOSIS — B9789 Other viral agents as the cause of diseases classified elsewhere: Secondary | ICD-10-CM | POA: Insufficient documentation

## 2012-08-31 DIAGNOSIS — R5381 Other malaise: Secondary | ICD-10-CM | POA: Insufficient documentation

## 2012-08-31 DIAGNOSIS — J069 Acute upper respiratory infection, unspecified: Secondary | ICD-10-CM | POA: Insufficient documentation

## 2012-08-31 DIAGNOSIS — R112 Nausea with vomiting, unspecified: Secondary | ICD-10-CM | POA: Insufficient documentation

## 2012-08-31 DIAGNOSIS — R51 Headache: Secondary | ICD-10-CM | POA: Insufficient documentation

## 2012-08-31 DIAGNOSIS — J3489 Other specified disorders of nose and nasal sinuses: Secondary | ICD-10-CM | POA: Insufficient documentation

## 2012-08-31 DIAGNOSIS — J40 Bronchitis, not specified as acute or chronic: Secondary | ICD-10-CM | POA: Insufficient documentation

## 2012-08-31 MED ORDER — GUAIFENESIN 100 MG/5ML PO LIQD: 100.0000 mg | ORAL | Status: DC | PRN

## 2012-08-31 MED ORDER — AZITHROMYCIN 250 MG PO TABS: 250.0000 mg | ORAL_TABLET | Freq: Every day | ORAL | Status: DC

## 2012-08-31 NOTE — ED Notes (Signed)
Onset 6 days ago nasal congestion, body achy,sorethroat and headache.  Seen in ED given medication states taking OTC mucinex  With slight relief.

## 2012-08-31 NOTE — ED Provider Notes (Signed)
Medical screening examination/treatment/procedure(s) were performed by non-physician practitioner and as supervising physician I was immediately available for consultation/collaboration.   Glynn Octave, MD 08/31/12 [phone]

## 2012-08-31 NOTE — ED Provider Notes (Signed)
History     CSN: [id number]  Arrival date & time 08/31/12  1308   First MD Initiated Contact with Patient 08/31/12 [phone]      Chief Complaint  Patient presents with  . Sore Throat  . Nasal Congestion    (Consider location/radiation/quality/duration/timing/severity/associated sxs/prior treatment) Patient is a 27 y.o. female presenting with URI.  URI The primary symptoms include fatigue, headaches, sore throat, swollen glands, cough, abdominal pain, nausea, vomiting and myalgias. Primary symptoms do not include fever, ear pain, wheezing, arthralgias or rash. The current episode started 6 to 7 days ago. This is a recurrent problem. The problem has been gradually improving.  The fatigue began 6 to 7 days ago. The fatigue has been unchanged since its onset.  The headache began more than 2 days ago. The pain from the headache is at a severity of 6/10. The headache is not associated with stiff neck.  The sore throat has been unchanged since its onset. The sore throat is moderate in intensity.  The cough began 6 to 7 days ago. The cough is productive. The sputum is yellow.  The vomiting began yesterday. Vomiting occurred once.  The following treatments were addressed: Acetaminophen was ineffective. A decongestant was effective.    Past Medical History  Diagnosis Date  . History of bronchitis 39yrs ago  . Migraine     last one a week ago    Past Surgical History  Procedure Date  . Tonsillectomy 03/10/2012    Procedure: TONSILLECTOMY;  Surgeon: Suzanna Obey, MD;  Location: [facility] OR;  Service: ENT;  Laterality: Bilateral;    No family history on file.  History  Substance Use Topics  . Smoking status: Current Every Day Smoker -- 0.5 packs/day for 13 years    Types: Cigarettes  . Smokeless tobacco: Not on file  . Alcohol Use: Yes     Comment: occassional    OB History    Grav Para Term Preterm Abortions TAB SAB Ect Mult Living                  Review of Systems  Constitutional:  Positive for fatigue. Negative for fever.       10 Systems reviewed and all are negative for acute change except as noted in the HPI.   HENT: Positive for sore throat. Negative for ear pain.   Respiratory: Positive for cough. Negative for wheezing.   Gastrointestinal: Positive for nausea, vomiting and abdominal pain.  Musculoskeletal: Positive for myalgias. Negative for arthralgias.  Skin: Negative for rash.  Neurological: Positive for headaches.    Allergies  Codeine  Home Medications   Current Outpatient Rx  Name  Route  Sig  Dispense  Refill  . ACETAMINOPHEN 500 MG PO TABS   Oral   Take 1,000 mg by mouth every 6 (six) hours as needed. For pain         . ALBUTEROL SULFATE HFA 108 (90 BASE) MCG/ACT IN AERS   Inhalation   Inhale 2 puffs into the lungs every 4 (four) hours as needed for wheezing or shortness of breath.   1 Inhaler   3   . BENZONATATE 100 MG PO CAPS   Oral   Take 2 capsules (200 mg total) by mouth 2 (two) times daily as needed for cough.   20 capsule   0   . GUAIFENESIN ER 600 MG PO TB12   Oral   Take 2 tablets (1,200 mg total) by mouth 2 (two) times daily.  20 tablet   0   . IPRATROPIUM BROMIDE 0.03 % NA SOLN   Nasal   Place 2 sprays into the nose 2 (two) times daily. PRN congestion   30 mL   0     BP 121/84  Pulse 64  Temp 98 F (36.7 C) (Oral)  Resp 16  SpO2 98%  LMP 08/08/2012  Physical Exam  Nursing note and vitals reviewed. Constitutional: She appears well-developed and well-nourished. No distress.       Awake, alert, nontoxic appearance  HENT:  Head: Atraumatic.  Right Ear: External ear normal.  Left Ear: External ear normal.  Nose: Nose normal.  Mouth/Throat: Oropharynx is clear and moist. No oropharyngeal exudate.  Eyes: Conjunctivae normal are normal. Right eye exhibits no discharge. Left eye exhibits no discharge.  Neck: Neck supple.  Cardiovascular: Normal rate and regular rhythm.   Pulmonary/Chest: Effort normal. No  respiratory distress. She exhibits no tenderness.  Abdominal: Soft. There is no tenderness. There is no rebound.  Musculoskeletal: She exhibits no tenderness.       ROM appears intact, no obvious focal weakness  Lymphadenopathy:    She has no cervical adenopathy.  Neurological:       Mental status and motor strength appears intact  Skin: No rash noted.  Psychiatric: She has a normal mood and affect.    ED Course  Procedures (including critical care time)  Labs Reviewed - No data to display No results found.   No diagnosis found.  1. Viral syndrome  MDM  Pt with viral syndrome, lasting for over a week.  She is currently afebrile, stable VS.  Since she is a smoker, will prescribe zithromax to prevent superimpose infection.  Cough medication prescribed.  Pt recommend smoking cessation.  Care instruction provided.    Pt was here 5 days ago for same complaint.  Was given albuterol/atrovent, along with tessalon and mucinex.  Pt has been taking her meds with some improvement.        Fayrene Helper, PA-C 08/31/12 1003

## 2012-09-02 ENCOUNTER — Encounter (HOSPITAL_COMMUNITY): Payer: Self-pay | Admitting: Emergency Medicine

## 2012-09-02 ENCOUNTER — Emergency Department (HOSPITAL_COMMUNITY)
Admission: EM | Admit: 2012-09-02 | Discharge: 2012-09-02 | Disposition: A | Discharge status: Home / Self Care | Payer: Self-pay | Attending: Emergency Medicine | Admitting: Emergency Medicine

## 2012-09-02 DIAGNOSIS — R059 Cough, unspecified: Secondary | ICD-10-CM | POA: Insufficient documentation

## 2012-09-02 DIAGNOSIS — J40 Bronchitis, not specified as acute or chronic: Secondary | ICD-10-CM | POA: Insufficient documentation

## 2012-09-02 DIAGNOSIS — R110 Nausea: Secondary | ICD-10-CM | POA: Insufficient documentation

## 2012-09-02 DIAGNOSIS — K921 Melena: Secondary | ICD-10-CM | POA: Insufficient documentation

## 2012-09-02 DIAGNOSIS — K625 Hemorrhage of anus and rectum: Secondary | ICD-10-CM | POA: Insufficient documentation

## 2012-09-02 DIAGNOSIS — R05 Cough: Secondary | ICD-10-CM | POA: Insufficient documentation

## 2012-09-02 DIAGNOSIS — Z3202 Encounter for pregnancy test, result negative: Secondary | ICD-10-CM | POA: Insufficient documentation

## 2012-09-02 DIAGNOSIS — F17200 Nicotine dependence, unspecified, uncomplicated: Secondary | ICD-10-CM | POA: Insufficient documentation

## 2012-09-02 DIAGNOSIS — Z8679 Personal history of other diseases of the circulatory system: Secondary | ICD-10-CM | POA: Insufficient documentation

## 2012-09-02 LAB — URINALYSIS, ROUTINE W REFLEX MICROSCOPIC
Bilirubin Urine: NEGATIVE
Glucose, UA: NEGATIVE mg/dL
Hgb urine dipstick: NEGATIVE
Specific Gravity, Urine: 1.034 — ABNORMAL HIGH (ref 1.005–1.030)
Urobilinogen, UA: 1 mg/dL (ref 0.0–1.0)
pH: 7.5 (ref 5.0–8.0)

## 2012-09-02 LAB — POCT PREGNANCY, URINE: Preg Test, Ur: NEGATIVE

## 2012-09-02 LAB — URINE MICROSCOPIC-ADD ON

## 2012-09-02 MED ORDER — PREDNISONE 10 MG PO TABS: ORAL_TABLET | ORAL | Status: DC

## 2012-09-02 MED ORDER — ALBUTEROL SULFATE HFA 108 (90 BASE) MCG/ACT IN AERS
2.0000 | INHALATION_SPRAY | RESPIRATORY_TRACT | Status: DC
  Filled 2012-09-02: qty 6.7

## 2012-09-02 MED ORDER — HYDROCORTISONE ACETATE 25 MG RE SUPP: 25.0000 mg | Freq: Two times a day (BID) | RECTAL | Status: DC

## 2012-09-02 MED ORDER — DOCUSATE SODIUM 250 MG PO CAPS: 250.0000 mg | ORAL_CAPSULE | Freq: Every day | ORAL | Status: DC

## 2012-09-02 NOTE — ED Notes (Signed)
States that her menstrual in also late and has had vomiting

## 2012-09-02 NOTE — ED Notes (Signed)
States that she was seen at [facility] on 08/26/12 URI, went back on 08/31/12 and was told that she had the Flu, states that she has gross blood in her BM for the last 2 days

## 2012-09-02 NOTE — ED Provider Notes (Signed)
History     CSN: [id number]  Arrival date & time 09/02/12  1536   First MD Initiated Contact with Patient 09/02/12 1641      Chief Complaint  Patient presents with  . URI  . Rectal Bleeding  . Nausea    (Consider location/radiation/quality/duration/timing/severity/associated sxs/prior treatment) Patient is a 27 y.o. female presenting with cough. The history is provided by the patient. No language interpreter was used.  Cough This is a new problem. The problem occurs constantly. The problem has been gradually worsening. The cough is productive of sputum. She has tried nothing for the symptoms. The treatment provided moderate relief.    Past Medical History  Diagnosis Date  . History of bronchitis 62yrs ago  . Migraine     last one a week ago    Past Surgical History  Procedure Date  . Tonsillectomy 03/10/2012    Procedure: TONSILLECTOMY;  Surgeon: Suzanna Obey, MD;  Location: [facility] OR;  Service: ENT;  Laterality: Bilateral;    No family history on file.  History  Substance Use Topics  . Smoking status: Current Every Day Smoker -- 0.5 packs/day for 13 years    Types: Cigarettes  . Smokeless tobacco: Not on file  . Alcohol Use: Yes     Comment: occassional    OB History    Grav Para Term Preterm Abortions TAB SAB Ect Mult Living                  Review of Systems  Respiratory: Positive for cough.   Gastrointestinal: Positive for blood in stool.  All other systems reviewed and are negative.    Allergies  Codeine  Home Medications   Current Outpatient Rx  Name  Route  Sig  Dispense  Refill  . ACETAMINOPHEN 325 MG PO TABS   Oral   Take 650 mg by mouth every 6 (six) hours as needed. Pain         . BENZONATATE 100 MG PO CAPS   Oral   Take 2 capsules (200 mg total) by mouth 2 (two) times daily as needed for cough.   20 capsule   0   . GUAIFENESIN 100 MG/5ML PO LIQD   Oral   Take 5-10 mLs (100-200 mg total) by mouth every 4 (four) hours as needed for  cough.   60 mL   0   . IPRATROPIUM BROMIDE 0.03 % NA SOLN   Nasal   Place 2 sprays into the nose 2 (two) times daily. PRN congestion   30 mL   0   . AZITHROMYCIN 250 MG PO TABS   Oral   Take 1 tablet (250 mg total) by mouth daily.   4 tablet   0     BP 118/71  Pulse 72  Temp 98.6 F (37 C) (Oral)  Resp 19  SpO2 100%  LMP 08/08/2012  Physical Exam  Nursing note and vitals reviewed. Constitutional: She is oriented to person, place, and time. She appears well-developed and well-nourished.  HENT:  Head: Normocephalic.  Eyes: Conjunctivae normal and EOM are normal. Pupils are equal, round, and reactive to light.  Neck: Normal range of motion.  Cardiovascular: Normal rate, regular rhythm and normal heart sounds.   Pulmonary/Chest: Effort normal and breath sounds normal.  Abdominal: Soft. Bowel sounds are normal.  Genitourinary:       Bright red rectal blood, small skin tag area/ hemmorhoidal tissue rectum  Musculoskeletal: Normal range of motion.  Neurological: She is alert  and oriented to person, place, and time. She has normal reflexes.  Skin: Skin is warm.  Psychiatric: She has a normal mood and affect.    ED Course  Procedures (including critical care time)  Labs Reviewed  URINALYSIS, ROUTINE W REFLEX MICROSCOPIC - Abnormal; Notable for the following:    APPearance CLOUDY (*)     Specific Gravity, Urine 1.034 (*)     Protein, ur 30 (*)     All other components within normal limits  URINE MICROSCOPIC-ADD ON - Abnormal; Notable for the following:    Squamous Epithelial / LPF FEW (*)     All other components within normal limits  POCT PREGNANCY, URINE   No results found.   No diagnosis found.    MDM     Albuterol inhaler, colace stool softner, prednisone taper     Elson Areas, [place] 09/02/12 [phone]

## 2012-09-03 NOTE — ED Provider Notes (Signed)
Medical screening examination/treatment/procedure(s) were performed by non-physician practitioner and as supervising physician I was immediately available for consultation/collaboration.  Mozes Sagar, MD 09/03/12 0943 

## 2012-10-28 ENCOUNTER — Inpatient Hospital Stay (HOSPITAL_COMMUNITY): Payer: Medicaid Other

## 2012-10-28 ENCOUNTER — Inpatient Hospital Stay (HOSPITAL_COMMUNITY)
Admission: AD | Admit: 2012-10-28 | Discharge: 2012-10-28 | Disposition: A | Discharge status: Home / Self Care | Payer: Medicaid Other | Source: Ambulatory Visit | Attending: Obstetrics & Gynecology | Admitting: Obstetrics & Gynecology

## 2012-10-28 ENCOUNTER — Encounter (HOSPITAL_COMMUNITY): Payer: Self-pay | Admitting: *Deleted

## 2012-10-28 DIAGNOSIS — L259 Unspecified contact dermatitis, unspecified cause: Secondary | ICD-10-CM | POA: Insufficient documentation

## 2012-10-28 DIAGNOSIS — R109 Unspecified abdominal pain: Secondary | ICD-10-CM | POA: Insufficient documentation

## 2012-10-28 DIAGNOSIS — O2390 Unspecified genitourinary tract infection in pregnancy, unspecified trimester: Secondary | ICD-10-CM | POA: Insufficient documentation

## 2012-10-28 DIAGNOSIS — N390 Urinary tract infection, site not specified: Secondary | ICD-10-CM | POA: Insufficient documentation

## 2012-10-28 DIAGNOSIS — O99891 Other specified diseases and conditions complicating pregnancy: Secondary | ICD-10-CM | POA: Insufficient documentation

## 2012-10-28 LAB — URINE MICROSCOPIC-ADD ON

## 2012-10-28 LAB — URINALYSIS, ROUTINE W REFLEX MICROSCOPIC
Glucose, UA: NEGATIVE mg/dL
Ketones, ur: NEGATIVE mg/dL
Protein, ur: NEGATIVE mg/dL
Urobilinogen, UA: 0.2 mg/dL (ref 0.0–1.0)

## 2012-10-28 LAB — POCT PREGNANCY, URINE: Preg Test, Ur: POSITIVE — AB

## 2012-10-28 MED ORDER — CEPHALEXIN 500 MG PO CAPS: 500.0000 mg | ORAL_CAPSULE | Freq: Four times a day (QID) | ORAL | Status: AC

## 2012-10-28 MED ORDER — CEPHALEXIN 500 MG PO CAPS: 500.0000 mg | ORAL_CAPSULE | Freq: Four times a day (QID) | ORAL | Status: DC

## 2012-10-28 MED ORDER — HYDROCORTISONE 0.5 % EX CREA: TOPICAL_CREAM | Freq: Two times a day (BID) | CUTANEOUS | Status: DC

## 2012-10-28 MED ORDER — HYDROCORTISONE 0.5 % EX CREA
TOPICAL_CREAM | Freq: Two times a day (BID) | CUTANEOUS | Status: DC
Start: 2012-10-28 — End: 2012-10-28

## 2012-10-28 NOTE — MAU Provider Note (Signed)
Attestation of Attending Supervision of Advanced Practitioner (CNM/NP): Evaluation and management procedures were performed by the Advanced Practitioner under my supervision and collaboration.  I have reviewed the Advanced Practitioner's note and chart, and I agree with the management and plan.  HARRAWAY-SMITH, CAROLYN 9:48 PM

## 2012-10-28 NOTE — MAU Provider Note (Signed)
Chief Complaint: Possible Pregnancy, Rash and Abdominal Pain   First Provider Initiated Contact with Patient 10/28/12 1429     SUBJECTIVE HPI: Robyn Ortiz is a 27 y.o. G2P1001 at Unknown by LMP who presents to maternity admissions reporting positive pregnancy test at home, fatigue and dizziness, and rash on thighs and arms.  She also has sharp pain in her left inguinal area and lower abdomen that is intermittent.  She denies LOF, vaginal bleeding, vaginal itching/burning, urinary symptoms, h/a, n/v, or fever/chills.  She denies recent changes in soaps or detergents or exposure to known allergens recently but pt has freckles and reports her skin can be sensitive at times.   Past Medical History  Diagnosis Date  . History of bronchitis 13yrs ago  . Migraine    Past Surgical History  Procedure Laterality Date  . Tonsillectomy  03/10/2012    Procedure: TONSILLECTOMY;  Surgeon: Suzanna Obey, MD;  Location: [facility] OR;  Service: ENT;  Laterality: Bilateral;   History   Social History  . Marital Status: Single    Spouse Name: N/A    Number of Children: N/A  . Years of Education: N/A   Occupational History  . Not on file.   Social History Main Topics  . Smoking status: Current Every Day Smoker -- 0.50 packs/day for 13 years    Types: Cigarettes  . Smokeless tobacco: Not on file  . Alcohol Use: No  . Drug Use: No  . Sexually Active: Yes    Birth Control/ Protection: Pill   Other Topics Concern  . Not on file   Social History Narrative  . No narrative on file   No current facility-administered medications on file prior to encounter.   Current Outpatient Prescriptions on File Prior to Encounter  Medication Sig Dispense Refill  . acetaminophen (TYLENOL) 325 MG tablet Take 650 mg by mouth every 6 (six) hours as needed. Pain       Allergies  Allergen Reactions  . Codeine Other (See Comments)    Pt. States she had a seizure    ROS: Pertinent items in HPI  OBJECTIVE Blood  pressure 112/67, pulse 66, temperature 98 F (36.7 C), temperature source Oral, resp. rate 16, height 5\' 4"  (1.626 m), weight 96.344 kg (212 lb 6.4 oz), last menstrual period 08/08/2012, SpO2 100.00%. GENERAL: Well-developed, well-nourished female in no acute distress.  HEENT: Normocephalic HEART: normal rate RESP: normal effort ABDOMEN: Soft, non-tender EXTREMITIES: Nontender, no edema, tiny pinpoint raised areas of erythema on forearms and tops of hands and large flat patches of erythema on inner thighs NEURO: Alert and oriented SPECULUM EXAM: Deferred--pt needed to leave MAU prior to exam  LAB RESULTS Results for orders placed during the hospital encounter of 10/28/12 (from the past 24 hour(s))  URINALYSIS, ROUTINE W REFLEX MICROSCOPIC     Status: Abnormal   Collection Time    10/28/12  1:51 PM      Result Value Range   Color, Urine STRAW (*) YELLOW   APPearance HAZY (*) CLEAR   Specific Gravity, Urine <1.005 (*) 1.005 - 1.030   pH 6.5  5.0 - 8.0   Glucose, UA NEGATIVE  NEGATIVE mg/dL   Hgb urine dipstick NEGATIVE  NEGATIVE   Bilirubin Urine NEGATIVE  NEGATIVE   Ketones, ur NEGATIVE  NEGATIVE mg/dL   Protein, ur NEGATIVE  NEGATIVE mg/dL   Urobilinogen, UA 0.2  0.0 - 1.0 mg/dL   Nitrite NEGATIVE  NEGATIVE   Leukocytes, UA MODERATE (*) NEGATIVE  URINE MICROSCOPIC-ADD ON     Status: Abnormal   Collection Time    10/28/12  1:51 PM      Result Value Range   Squamous Epithelial / LPF MANY (*) RARE   WBC, UA 7-10  <3 WBC/hpf   Bacteria, UA FEW (*) RARE  POCT PREGNANCY, URINE     Status: Abnormal   Collection Time    10/28/12  1:58 PM      Result Value Range   Preg Test, Ur POSITIVE (*) NEGATIVE    IMAGING    ASSESSMENT 1. UTI in pregnancy, antepartum, first trimester   2. Normal IUP (intrauterine pregnancy) on prenatal ultrasound   3. Contact dermatitis     PLAN Discharge home Keflex for UTI, hydrocortisone cream for rash F/U with early prenatal care Pregnancy  verification and list of providers given Return to MAU as needed    Medication List    ASK your doctor about these medications       acetaminophen 325 MG tablet  Commonly known as:  TYLENOL  Take 650 mg by mouth every 6 (six) hours as needed. Pain     albuterol 108 (90 BASE) MCG/ACT inhaler  Commonly known as:  PROVENTIL HFA;VENTOLIN HFA  Inhale 2 puffs into the lungs every 6 (six) hours as needed for wheezing.     calcium carbonate 750 MG chewable tablet  Commonly known as:  TUMS EX  Chew 2 tablets by mouth daily as needed for heartburn.     prenatal multivitamin Tabs  Take 1 tablet by mouth every morning.         Sharen Counter Certified Nurse-Midwife 10/28/2012  5:06 PM

## 2012-10-28 NOTE — MAU Note (Signed)
Pt states she doesn't know if her last period was in December or January.

## 2012-10-28 NOTE — MAU Note (Signed)
Patient states she had a positive home pregnancy test on 3-1. States today she became lightheaded and felt dizzy and noticed swollen hands and a fine red rash. Denies bleeding or vaginal discharge.

## 2012-10-28 NOTE — Discharge Instructions (Signed)
Pregnancy - First Trimester During sexual intercourse, millions of sperm go into the vagina. Only 1 sperm will penetrate and fertilize the female egg while it is in the Fallopian tube. One week later, the fertilized egg implants into the wall of the uterus. An embryo begins to develop into a baby. At 6 to 8 weeks, the eyes and face are formed and the heartbeat can be seen on ultrasound. At the end of 12 weeks (first trimester), all the baby's organs are formed. Now that you are pregnant, you will want to do everything you can to have a healthy baby. Two of the most important things are to get good prenatal care and follow your caregiver's instructions. Prenatal care is all the medical care you receive before the baby's birth. It is given to prevent, find, and treat problems during the pregnancy and childbirth. PRENATAL EXAMS  During prenatal visits, your weight, blood pressure and urine are checked. This is done to make sure you are healthy and progressing normally during the pregnancy.  A pregnant woman should gain 25 to 35 pounds during the pregnancy. However, if you are over weight or underweight, your caregiver will advise you regarding your weight.  Your caregiver will ask and answer questions for you.  Blood work, cervical cultures, other necessary tests and a Pap test are done during your prenatal exams. These tests are done to check on your health and the probable health of your baby. Tests are strongly recommended and done for HIV with your permission. This is the virus that causes AIDS. These tests are done because medications can be given to help prevent your baby from being born with this infection should you have been infected without knowing it. Blood work is also used to find out your blood type, previous infections and follow your blood levels (hemoglobin).  Low hemoglobin (anemia) is common during pregnancy. Iron and vitamins are given to help prevent this. Later in the pregnancy, blood  tests for diabetes will be done along with any other tests if any problems develop. You may need tests to make sure you and the baby are doing well.  You may need other tests to make sure you and the baby are doing well. CHANGES DURING THE FIRST TRIMESTER (THE FIRST 3 MONTHS OF PREGNANCY) Your body goes through many changes during pregnancy. They vary from person to person. Talk to your caregiver about changes you notice and are concerned about. Changes can include:  Your menstrual period stops.  The egg and sperm carry the genes that determine what you look like. Genes from you and your partner are forming a baby. The female genes determine whether the baby is a boy or a girl.  Your body increases in girth and you may feel bloated.  Feeling sick to your stomach (nauseous) and throwing up (vomiting). If the vomiting is uncontrollable, call your caregiver.  Your breasts will begin to enlarge and become tender.  Your nipples may stick out more and become darker.  The need to urinate more. Painful urination may mean you have a bladder infection.  Tiring easily.  Loss of appetite.  Cravings for certain kinds of food.  At first, you may gain or lose a couple of pounds.  You may have changes in your emotions from day to day (excited to be pregnant or concerned something may go wrong with the pregnancy and baby).  You may have more vivid and strange dreams. HOME CARE INSTRUCTIONS   It is very important   to avoid all smoking, alcohol and un-prescribed drugs during your pregnancy. These affect the formation and growth of the baby. Avoid chemicals while pregnant to ensure the delivery of a healthy infant.  Start your prenatal visits by the 12th week of pregnancy. They are usually scheduled monthly at first, then more often in the last 2 months before delivery. Keep your caregiver's appointments. Follow your caregiver's instructions regarding medication use, blood and lab tests, exercise, and  diet.  During pregnancy, you are providing food for you and your baby. Eat regular, well-balanced meals. Choose foods such as meat, fish, milk and other low fat dairy products, vegetables, fruits, and whole-grain breads and cereals. Your caregiver will tell you of the ideal weight gain.  You can help morning sickness by keeping soda crackers at the bedside. Eat a couple before arising in the morning. You may want to use the crackers without salt on them.  Eating 4 to 5 small meals rather than 3 large meals a day also may help the nausea and vomiting.  Drinking liquids between meals instead of during meals also seems to help nausea and vomiting.  A physical sexual relationship may be continued throughout pregnancy if there are no other problems. Problems may be early (premature) leaking of amniotic fluid from the membranes, vaginal bleeding, or belly (abdominal) pain.  Exercise regularly if there are no restrictions. Check with your caregiver or physical therapist if you are unsure of the safety of some of your exercises. Greater weight gain will occur in the last 2 trimesters of pregnancy. Exercising will help:  Control your weight.  Keep you in shape.  Prepare you for labor and delivery.  Help you lose your pregnancy weight after you deliver your baby.  Wear a good support or jogging bra for breast tenderness during pregnancy. This may help if worn during sleep too.  Ask when prenatal classes are available. Begin classes when they are offered.  Do not use hot tubs, steam rooms or saunas.  Wear your seat belt when driving. This protects you and your baby if you are in an accident.  Avoid raw meat, uncooked cheese, cat litter boxes and soil used by cats throughout the pregnancy. These carry germs that can cause birth defects in the baby.  The first trimester is a good time to visit your dentist for your dental health. Getting your teeth cleaned is OK. Use a softer toothbrush and brush  gently during pregnancy.  Ask for help if you have financial, counseling or nutritional needs during pregnancy. Your caregiver will be able to offer counseling for these needs as well as refer you for other special needs.  Do not take any medications or herbs unless told by your caregiver.  Inform your caregiver if there is any mental or physical domestic violence.  Make a list of emergency phone numbers of family, friends, hospital, and police and fire departments.  Write down your questions. Take them to your prenatal visit.  Do not douche.  Do not cross your legs.  If you have to stand for long periods of time, rotate you feet or take small steps in a circle.  You may have more vaginal secretions that may require a sanitary pad. Do not use tampons or scented sanitary pads. MEDICATIONS AND DRUG USE IN PREGNANCY  Take prenatal vitamins as directed. The vitamin should contain 1 milligram of folic acid. Keep all vitamins out of reach of children. Only a couple vitamins or tablets containing iron may be   fatal to a baby or young child when ingested.  Avoid use of all medications, including herbs, over-the-counter medications, not prescribed or suggested by your caregiver. Only take over-the-counter or prescription medicines for pain, discomfort, or fever as directed by your caregiver. Do not use aspirin, ibuprofen, or naproxen unless directed by your caregiver.  Let your caregiver also know about herbs you may be using.  Alcohol is related to a number of birth defects. This includes fetal alcohol syndrome. All alcohol, in any form, should be avoided completely. Smoking will cause low birth rate and premature babies.  Street or illegal drugs are very harmful to the baby. They are absolutely forbidden. A baby born to an addicted mother will be addicted at birth. The baby will go through the same withdrawal an adult does.  Let your caregiver know about any medications that you have to take  and for what reason you take them. MISCARRIAGE IS COMMON DURING PREGNANCY A miscarriage does not mean you did something wrong. It is not a reason to worry about getting pregnant again. Your caregiver will help you with questions you may have. If you have a miscarriage, you may need minor surgery. SEEK MEDICAL CARE IF:  You have any concerns or worries during your pregnancy. It is better to call with your questions if you feel they cannot wait, rather than worry about them. SEEK IMMEDIATE MEDICAL CARE IF:   An unexplained oral temperature above 102 F (38.9 C) develops, or as your caregiver suggests.  You have leaking of fluid from the vagina (birth canal). If leaking membranes are suspected, take your temperature and inform your caregiver of this when you call.  There is vaginal spotting or bleeding. Notify your caregiver of the amount and how many pads are used.  You develop a bad smelling vaginal discharge with a change in the color.  You continue to feel sick to your stomach (nauseated) and have no relief from remedies suggested. You vomit blood or coffee ground-like materials.  You lose more than 2 pounds of weight in 1 week.  You gain more than 2 pounds of weight in 1 week and you notice swelling of your face, hands, feet, or legs.  You gain 5 pounds or more in 1 week (even if you do not have swelling of your hands, face, legs, or feet).  You get exposed to German measles and have never had them.  You are exposed to fifth disease or chickenpox.  You develop belly (abdominal) pain. Round ligament discomfort is a common non-cancerous (benign) cause of abdominal pain in pregnancy. Your caregiver still must evaluate this.  You develop headache, fever, diarrhea, pain with urination, or shortness of breath.  You fall or are in a car accident or have any kind of trauma.  There is mental or physical violence in your home. Document Released: 07/28/2001 Document Revised: 10/26/2011  Document Reviewed: 01/29/2009 [employer] Patient Information 2013 [employer]  

## 2012-10-29 LAB — URINE CULTURE
Colony Count: NO GROWTH
Culture: NO GROWTH

## 2012-11-09 ENCOUNTER — Encounter: Payer: Self-pay | Admitting: *Deleted

## 2012-11-09 ENCOUNTER — Ambulatory Visit (INDEPENDENT_AMBULATORY_CARE_PROVIDER_SITE_OTHER): Payer: Medicaid Other | Admitting: *Deleted

## 2012-11-09 VITALS — BP 120/65 | Wt 212.0 lb

## 2012-11-09 DIAGNOSIS — Z3480 Encounter for supervision of other normal pregnancy, unspecified trimester: Secondary | ICD-10-CM

## 2012-11-09 DIAGNOSIS — Z3481 Encounter for supervision of other normal pregnancy, first trimester: Secondary | ICD-10-CM

## 2012-11-10 LAB — OBSTETRIC PANEL
Antibody Screen: NEGATIVE
Eosinophils Relative: 2 % (ref 0–5)
HCT: 36.7 % (ref 36.0–46.0)
Lymphocytes Relative: 21 % (ref 12–46)
Lymphs Abs: 1.9 10*3/uL (ref 0.7–4.0)
MCV: 87.8 fL (ref 78.0–100.0)
Monocytes Absolute: 0.6 10*3/uL (ref 0.1–1.0)
Monocytes Relative: 7 % (ref 3–12)
RBC: 4.18 MIL/uL (ref 3.87–5.11)
Rubella: 1.97 Index — ABNORMAL HIGH (ref <0.90)
WBC: 8.8 10*3/uL (ref 4.0–10.5)

## 2012-11-11 LAB — CULTURE, OB URINE
Colony Count: NO GROWTH
Organism ID, Bacteria: NO GROWTH

## 2012-11-21 ENCOUNTER — Encounter: Payer: Self-pay | Admitting: Obstetrics and Gynecology

## 2012-11-21 ENCOUNTER — Other Ambulatory Visit (HOSPITAL_COMMUNITY)
Admission: RE | Admit: 2012-11-21 | Discharge: 2012-11-21 | Disposition: A | Discharge status: Home / Self Care | Payer: Medicaid Other | Source: Ambulatory Visit | Attending: Obstetrics and Gynecology | Admitting: Obstetrics and Gynecology

## 2012-11-21 ENCOUNTER — Ambulatory Visit (INDEPENDENT_AMBULATORY_CARE_PROVIDER_SITE_OTHER): Payer: Medicaid Other | Admitting: Obstetrics and Gynecology

## 2012-11-21 VITALS — BP 99/66 | Wt 213.0 lb

## 2012-11-21 DIAGNOSIS — Z01419 Encounter for gynecological examination (general) (routine) without abnormal findings: Secondary | ICD-10-CM | POA: Insufficient documentation

## 2012-11-21 DIAGNOSIS — J45909 Unspecified asthma, uncomplicated: Secondary | ICD-10-CM

## 2012-11-21 DIAGNOSIS — A630 Anogenital (venereal) warts: Secondary | ICD-10-CM

## 2012-11-21 DIAGNOSIS — Z113 Encounter for screening for infections with a predominantly sexual mode of transmission: Secondary | ICD-10-CM | POA: Insufficient documentation

## 2012-11-21 DIAGNOSIS — O99519 Diseases of the respiratory system complicating pregnancy, unspecified trimester: Secondary | ICD-10-CM

## 2012-11-21 DIAGNOSIS — Z3481 Encounter for supervision of other normal pregnancy, first trimester: Secondary | ICD-10-CM

## 2012-11-21 DIAGNOSIS — Z87891 Personal history of nicotine dependence: Secondary | ICD-10-CM | POA: Insufficient documentation

## 2012-11-21 DIAGNOSIS — O99210 Obesity complicating pregnancy, unspecified trimester: Secondary | ICD-10-CM | POA: Insufficient documentation

## 2012-11-21 DIAGNOSIS — G43909 Migraine, unspecified, not intractable, without status migrainosus: Secondary | ICD-10-CM | POA: Insufficient documentation

## 2012-11-21 DIAGNOSIS — Z3480 Encounter for supervision of other normal pregnancy, unspecified trimester: Secondary | ICD-10-CM

## 2012-11-21 DIAGNOSIS — E669 Obesity, unspecified: Secondary | ICD-10-CM | POA: Insufficient documentation

## 2012-11-21 DIAGNOSIS — F17200 Nicotine dependence, unspecified, uncomplicated: Secondary | ICD-10-CM | POA: Insufficient documentation

## 2012-11-21 DIAGNOSIS — O219 Vomiting of pregnancy, unspecified: Secondary | ICD-10-CM

## 2012-11-21 MED ORDER — ONDANSETRON 4 MG PO TBDP: 4.0000 mg | ORAL_TABLET | Freq: Four times a day (QID) | ORAL | Status: DC | PRN

## 2012-11-21 NOTE — Addendum Note (Signed)
Addended by: Barbara Cower on: 11/21/2012 04:52 PM   Modules accepted: Orders

## 2012-11-21 NOTE — Patient Instructions (Signed)
Pregnancy - First Trimester During sexual intercourse, millions of sperm go into the vagina. Only 1 sperm will penetrate and fertilize the female egg while it is in the Fallopian tube. One week later, the fertilized egg implants into the wall of the uterus. An embryo begins to develop into a baby. At 6 to 8 weeks, the eyes and face are formed and the heartbeat can be seen on ultrasound. At the end of 12 weeks (first trimester), all the baby's organs are formed. Now that you are pregnant, you will want to do everything you can to have a healthy baby. Two of the most important things are to get good prenatal care and follow your caregiver's instructions. Prenatal care is all the medical care you receive before the baby's birth. It is given to prevent, find, and treat problems during the pregnancy and childbirth. PRENATAL EXAMS  During prenatal visits, your weight, blood pressure and urine are checked. This is done to make sure you are healthy and progressing normally during the pregnancy.  A pregnant woman should gain 25 to 35 pounds during the pregnancy. However, if you are over weight or underweight, your caregiver will advise you regarding your weight.  Your caregiver will ask and answer questions for you.  Blood work, cervical cultures, other necessary tests and a Pap test are done during your prenatal exams. These tests are done to check on your health and the probable health of your baby. Tests are strongly recommended and done for HIV with your permission. This is the virus that causes AIDS. These tests are done because medications can be given to help prevent your baby from being born with this infection should you have been infected without knowing it. Blood work is also used to find out your blood type, previous infections and follow your blood levels (hemoglobin).  Low hemoglobin (anemia) is common during pregnancy. Iron and vitamins are given to help prevent this. Later in the pregnancy, blood  tests for diabetes will be done along with any other tests if any problems develop. You may need tests to make sure you and the baby are doing well.  You may need other tests to make sure you and the baby are doing well. CHANGES DURING THE FIRST TRIMESTER (THE FIRST 3 MONTHS OF PREGNANCY) Your body goes through many changes during pregnancy. They vary from person to person. Talk to your caregiver about changes you notice and are concerned about. Changes can include:  Your menstrual period stops.  The egg and sperm carry the genes that determine what you look like. Genes from you and your partner are forming a baby. The female genes determine whether the baby is a boy or a girl.  Your body increases in girth and you may feel bloated.  Feeling sick to your stomach (nauseous) and throwing up (vomiting). If the vomiting is uncontrollable, call your caregiver.  Your breasts will begin to enlarge and become tender.  Your nipples may stick out more and become darker.  The need to urinate more. Painful urination may mean you have a bladder infection.  Tiring easily.  Loss of appetite.  Cravings for certain kinds of food.  At first, you may gain or lose a couple of pounds.  You may have changes in your emotions from day to day (excited to be pregnant or concerned something may go wrong with the pregnancy and baby).  You may have more vivid and strange dreams. HOME CARE INSTRUCTIONS   It is very important   to avoid all smoking, alcohol and un-prescribed drugs during your pregnancy. These affect the formation and growth of the baby. Avoid chemicals while pregnant to ensure the delivery of a healthy infant.  Start your prenatal visits by the 12th week of pregnancy. They are usually scheduled monthly at first, then more often in the last 2 months before delivery. Keep your caregiver's appointments. Follow your caregiver's instructions regarding medication use, blood and lab tests, exercise, and  diet.  During pregnancy, you are providing food for you and your baby. Eat regular, well-balanced meals. Choose foods such as meat, fish, milk and other low fat dairy products, vegetables, fruits, and whole-grain breads and cereals. Your caregiver will tell you of the ideal weight gain.  You can help morning sickness by keeping soda crackers at the bedside. Eat a couple before arising in the morning. You may want to use the crackers without salt on them.  Eating 4 to 5 small meals rather than 3 large meals a day also may help the nausea and vomiting.  Drinking liquids between meals instead of during meals also seems to help nausea and vomiting.  A physical sexual relationship may be continued throughout pregnancy if there are no other problems. Problems may be early (premature) leaking of amniotic fluid from the membranes, vaginal bleeding, or belly (abdominal) pain.  Exercise regularly if there are no restrictions. Check with your caregiver or physical therapist if you are unsure of the safety of some of your exercises. Greater weight gain will occur in the last 2 trimesters of pregnancy. Exercising will help:  Control your weight.  Keep you in shape.  Prepare you for labor and delivery.  Help you lose your pregnancy weight after you deliver your baby.  Wear a good support or jogging bra for breast tenderness during pregnancy. This may help if worn during sleep too.  Ask when prenatal classes are available. Begin classes when they are offered.  Do not use hot tubs, steam rooms or saunas.  Wear your seat belt when driving. This protects you and your baby if you are in an accident.  Avoid raw meat, uncooked cheese, cat litter boxes and soil used by cats throughout the pregnancy. These carry germs that can cause birth defects in the baby.  The first trimester is a good time to visit your dentist for your dental health. Getting your teeth cleaned is OK. Use a softer toothbrush and brush  gently during pregnancy.  Ask for help if you have financial, counseling or nutritional needs during pregnancy. Your caregiver will be able to offer counseling for these needs as well as refer you for other special needs.  Do not take any medications or herbs unless told by your caregiver.  Inform your caregiver if there is any mental or physical domestic violence.  Make a list of emergency phone numbers of family, friends, hospital, and police and fire departments.  Write down your questions. Take them to your prenatal visit.  Do not douche.  Do not cross your legs.  If you have to stand for long periods of time, rotate you feet or take small steps in a circle.  You may have more vaginal secretions that may require a sanitary pad. Do not use tampons or scented sanitary pads. MEDICATIONS AND DRUG USE IN PREGNANCY  Take prenatal vitamins as directed. The vitamin should contain 1 milligram of folic acid. Keep all vitamins out of reach of children. Only a couple vitamins or tablets containing iron may be   fatal to a baby or young child when ingested.  Avoid use of all medications, including herbs, over-the-counter medications, not prescribed or suggested by your caregiver. Only take over-the-counter or prescription medicines for pain, discomfort, or fever as directed by your caregiver. Do not use aspirin, ibuprofen, or naproxen unless directed by your caregiver.  Let your caregiver also know about herbs you may be using.  Alcohol is related to a number of birth defects. This includes fetal alcohol syndrome. All alcohol, in any form, should be avoided completely. Smoking will cause low birth rate and premature babies.  Street or illegal drugs are very harmful to the baby. They are absolutely forbidden. A baby born to an addicted mother will be addicted at birth. The baby will go through the same withdrawal an adult does.  Let your caregiver know about any medications that you have to take  and for what reason you take them. MISCARRIAGE IS COMMON DURING PREGNANCY A miscarriage does not mean you did something wrong. It is not a reason to worry about getting pregnant again. Your caregiver will help you with questions you may have. If you have a miscarriage, you may need minor surgery. SEEK MEDICAL CARE IF:  You have any concerns or worries during your pregnancy. It is better to call with your questions if you feel they cannot wait, rather than worry about them. SEEK IMMEDIATE MEDICAL CARE IF:   An unexplained oral temperature above 102 F (38.9 C) develops, or as your caregiver suggests.  You have leaking of fluid from the vagina (birth canal). If leaking membranes are suspected, take your temperature and inform your caregiver of this when you call.  There is vaginal spotting or bleeding. Notify your caregiver of the amount and how many pads are used.  You develop a bad smelling vaginal discharge with a change in the color.  You continue to feel sick to your stomach (nauseated) and have no relief from remedies suggested. You vomit blood or coffee ground-like materials.  You lose more than 2 pounds of weight in 1 week.  You gain more than 2 pounds of weight in 1 week and you notice swelling of your face, hands, feet, or legs.  You gain 5 pounds or more in 1 week (even if you do not have swelling of your hands, face, legs, or feet).  You get exposed to German measles and have never had them.  You are exposed to fifth disease or chickenpox.  You develop belly (abdominal) pain. Round ligament discomfort is a common non-cancerous (benign) cause of abdominal pain in pregnancy. Your caregiver still must evaluate this.  You develop headache, fever, diarrhea, pain with urination, or shortness of breath.  You fall or are in a car accident or have any kind of trauma.  There is mental or physical violence in your home. Document Released: 07/28/2001 Document Revised: 10/26/2011  Document Reviewed: 01/29/2009 [employer] Patient Information 2013 [employer].  

## 2012-11-21 NOTE — Progress Notes (Signed)
   Subjective:    Robyn Ortiz is a G2P1001 [redacted]w[redacted]d being seen today for her first obstetrical visit.  Her obstetrical history is significant for obesity and smoker. Patient does intend to breast feed. Pregnancy history fully reviewed.  Patient reports nausea, no bleeding and no cramping.  Filed Vitals:   11/21/12 1602  BP: 99/66  Weight: 213 lb (96.616 kg)    HISTORY: OB History   Grav Para Term Preterm Abortions TAB SAB Ect Mult Living   2 1 1       1      # Outc Date GA Lbr Len/2nd Wgt Sex Del Anes PTL Lv   1 TRM 3/05 [redacted]w[redacted]d  7lb9oz(3.43kg) F SVD EPI No Yes   Comments: Patient feels like her due date was estimated wrong and she went way past her due date.   2 CUR              Past Medical History  Diagnosis Date  . History of bronchitis 67yrs ago  . Migraine   . History of bleeding ulcers     told not to take ibuprofen   Past Surgical History  Procedure Laterality Date  . Tonsillectomy  03/10/2012    Procedure: TONSILLECTOMY;  Surgeon: Suzanna Obey, MD;  Location[facility];  Service: ENT;  Laterality: Bilateral;   Family History  Problem Relation Age of Onset  . Diabetes Father   . Heart disease Paternal Grandmother   . Cancer Paternal Grandfather     intestional cancer     Exam    Uterus:     Pelvic Exam:    Perineum: Normal Perineum   Vulva: Normal, with numerous small perirectal condyloma   Vagina:  normal mucosa, normal discharge   pH:    Cervix: closed and long   Adnexa: no mass, fullness, tenderness   Bony Pelvis: android  System: Breast:  normal appearance, no masses or tenderness   Skin: normal coloration and turgor, no rashes    Neurologic: oriented, no focal deficits   Extremities: normal strength, tone, and muscle mass   HEENT extra ocular movement intact   Mouth/Teeth mucous membranes moist, pharynx normal without lesions   Neck supple and no masses   Cardiovascular: regular rate and rhythm   Respiratory:  chest clear, no wheezing,  crepitations, rhonchi, normal symmetric air entry   Abdomen: soft, non-tender; bowel sounds normal; no masses,  no organomegaly   Urinary:       Assessment:    Pregnancy: G2P1001 There is no problem list on file for this patient.       Plan:     Initial labs drawn. Prenatal vitamins. Problem list reviewed and updated. Genetic Screening discussed First Screen: requested.  Ultrasound discussed; fetal survey: requested. 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 Patient undecided on treatment at this time Will refer to headache specialist for management of migraines  Follow up in 4 weeks. 50% of 30 min visit spent on counseling and coordination of care.     Giavanni Odonovan 11/21/2012

## 2012-11-22 ENCOUNTER — Other Ambulatory Visit: Payer: Self-pay | Admitting: Obstetrics and Gynecology

## 2012-11-22 ENCOUNTER — Encounter (HOSPITAL_COMMUNITY): Payer: Self-pay | Admitting: Obstetrics and Gynecology

## 2012-11-22 DIAGNOSIS — Z3682 Encounter for antenatal screening for nuchal translucency: Secondary | ICD-10-CM

## 2012-11-29 ENCOUNTER — Other Ambulatory Visit: Payer: Self-pay

## 2012-11-29 ENCOUNTER — Ambulatory Visit (HOSPITAL_COMMUNITY)
Admission: RE | Admit: 2012-11-29 | Discharge: 2012-11-29 | Disposition: A | Discharge status: Home / Self Care | Payer: Medicaid Other | Source: Ambulatory Visit | Attending: Nurse Practitioner | Admitting: Nurse Practitioner

## 2012-11-29 DIAGNOSIS — Z3682 Encounter for antenatal screening for nuchal translucency: Secondary | ICD-10-CM

## 2012-11-29 DIAGNOSIS — O351XX0 Maternal care for (suspected) chromosomal abnormality in fetus, not applicable or unspecified: Secondary | ICD-10-CM | POA: Insufficient documentation

## 2012-11-29 DIAGNOSIS — Z3689 Encounter for other specified antenatal screening: Secondary | ICD-10-CM | POA: Insufficient documentation

## 2012-11-29 DIAGNOSIS — O99330 Smoking (tobacco) complicating pregnancy, unspecified trimester: Secondary | ICD-10-CM | POA: Insufficient documentation

## 2012-11-29 DIAGNOSIS — O3510X0 Maternal care for (suspected) chromosomal abnormality in fetus, unspecified, not applicable or unspecified: Secondary | ICD-10-CM | POA: Insufficient documentation

## 2012-11-30 ENCOUNTER — Encounter: Payer: Self-pay | Admitting: Obstetrics and Gynecology

## 2012-12-07 ENCOUNTER — Telehealth (HOSPITAL_COMMUNITY): Payer: Self-pay | Admitting: MS"

## 2012-12-07 NOTE — Telephone Encounter (Signed)
Received message that patient called and requested call back from me at [phone]. Left message for patient that I was returning her call. I can be reached today in [facility] office or if call is pertaining to rescheduling her appointment, front desk staff at [facility] would also be able to assist her.   Clydie Braun Kaspian Muccio 12/07/2012 10:22 AM

## 2012-12-07 NOTE — Telephone Encounter (Signed)
Called [name] to discuss results of first trimester screening. Patient was identified by name and DOB. Discussed with [name] that first trimester screening indicated an increased chance for fetal Down syndrome of 1 in 15 (6.7%). Reviewed with [name] that this is not diagnostic for Down syndrome. Discussed with [name] that there are additional screens and tests available in the pregnancy to obtain additional information about presence of Down syndrome in the pregnancy. We also reviewed that first trimester screening was within normal range regarding risk assessment for trisomy 18/13.   We briefly discussed that these additional optional tests include a blood test (a more accurate screening test), second trimester anatomy ultrasound, and diagnostic testing, such as amniocentesis, which is an invasive test. Offered [name] genetic counseling to further discuss this information in detail. The patient stated that she is interested in genetic counseling. [name] requested an appointment at 3:00, given that she typically works from 6:00 am to 2:00 pm. Genetic Counseling is currently scheduled for 12/21/12 at 3:00 pm at [facility]. The patient was encouraged to call with additional questions or concerns, if any arise prior to this scheduled appointment.   Quinn Plowman, MS Patent attorney

## 2012-12-12 ENCOUNTER — Other Ambulatory Visit: Payer: Self-pay

## 2012-12-12 ENCOUNTER — Ambulatory Visit (HOSPITAL_COMMUNITY)
Admission: RE | Admit: 2012-12-12 | Discharge: 2012-12-12 | Disposition: A | Discharge status: Home / Self Care | Payer: Medicaid Other | Source: Ambulatory Visit | Attending: Obstetrics & Gynecology | Admitting: Obstetrics & Gynecology

## 2012-12-12 DIAGNOSIS — O3510X0 Maternal care for (suspected) chromosomal abnormality in fetus, unspecified, not applicable or unspecified: Secondary | ICD-10-CM | POA: Insufficient documentation

## 2012-12-12 DIAGNOSIS — IMO0002 Reserved for concepts with insufficient information to code with codable children: Secondary | ICD-10-CM | POA: Insufficient documentation

## 2012-12-12 DIAGNOSIS — O351XX0 Maternal care for (suspected) chromosomal abnormality in fetus, not applicable or unspecified: Secondary | ICD-10-CM | POA: Insufficient documentation

## 2012-12-12 NOTE — Progress Notes (Signed)
Genetic Counseling  High-Risk Gestation Note  Appointment Date:  12/12/2012 Referred By: Allie Bossier, MD Date of Birth:  1986-01-22 Partner: Mateo Flow  Pregnancy History: [id number] Estimated Date of Delivery: 06/11/13 Estimated Gestational Age: [redacted]w[redacted]d Attending: Eulis Foster, MD  Ms. Donnelly Angelica and her partner, Mr. Kateryn Marasigan, were seen for genetic counseling because of an increased risk for fetal Down syndrome based on first trimester screening performed through [facility].  They were counseled regarding the First trimester screen result and the associated 1 in 15 (6.7%) risk for fetal Down syndrome.  We reviewed chromosomes, nondisjunction, and the common features and variable prognosis of Down syndrome.  In addition, we reviewed the screen adjusted reduction in risks for trisomy 18/13 (1 in 1,671 to 1 in 5,720).  We also discussed other explanations for a screen positive result including: differences in maternal metabolism and normal variation. They understand that this screening is not diagnostic for Down syndrome but provides a risk assessment. We specifically reviewed the nuchal translucency measurement, which was visualized to be 3.4 mm. We discussed that this measurement is reportedly at the 95%tile, which is upper limits of normal. However, we also discussed that during the patient's ultrasound exam, multiple views were obtained and some measurements appeared to be smaller. Thus, it is possible that this may be an overestimate of the risk for fetal Down syndrome.   We reviewed the various common etiologies for an increased NT including: aneuploidy, single gene conditions, cardiac or great vessel abnormalities, lymphatic system failure, decreased fetal movement, and fetal anemia.  We reviewed that the associated risk for a cardiac defect is ~1% based on an NT of 3.4 mm.  Single gene conditions are not routinely tested for prenatally unless ultrasound  findings or family history significantly increase the suspicion of a specific single gene disorder.  However, we also reviewed that at the time of Ms. Greeson's first trimester ultrasound, other views of the nuchal translucency were obtained that indicated smaller measurements, with 3.4 mm being the highest measurement obtained.   We reviewed other available screening options including noninvasive prenatal screening (NIPS) and detailed ultrasound.  Specifically, we discussed that NIPS analyzes cell free fetal DNA found in the maternal circulation. This test is not diagnostic for chromosome conditions, but can provide information regarding the presence or absence of extra fetal DNA for chromosomes 13, 18, and 21. Thus, it would not identify or rule out all genetic conditions. The reported detection rate is greater than 99% for Trisomy 21, greater than 98% for Trisomy 18, and is approximately 80% (8 out of 10) for Trisomy 13. The false positive rate is reported to be less than 0.1% for any of these conditions.  In addition, we discussed that ~50-80% of fetuses with Down syndrome, when well visualized, have detectable anomalies or soft markers by detailed ultrasound (~18+ weeks gestation).  This couple was also counseled regarding diagnostic testing via amniocentesis.  We reviewed the approximate 1 in 300-500 risk for complications, including spontaneous pregnancy loss. After consideration of all the options, they elected to proceed with cell free fetal DNA testing ([facility]) today. Those results will be available in 8-10 days. The patient stated that she was interested in risk assessment provided by NIPS in order to better plan and prepare for the remainder of the pregnancy. Ms. AALA RANSOM declined amniocentesis given the associated risk of complications.  Detailed ultrasound was scheduled for 01/10/13.  In addition, given the nuchal translucency measurement, which is  reported to be at the 95%tile for  gestational age, fetal echocardiogram would also be available to the patient in the second trimester.   Ms. ETOILE LOOMAN was provided with written information regarding cystic fibrosis (CF) including the carrier frequency and incidence in the Caucasian population, the availability of carrier testing and prenatal diagnosis if indicated.  In addition, we discussed that CF is routinely screened for as part of the [place] newborn screening panel.  She declined testing today.   Both family histories were reviewed and found to be contributory for Joellyn Quails Malformation (DWM) in a half-niece for the father of the pregnancy (his maternal half-brother's daughter). This relative is reportedly approximately 26 years old, has Building control surveyor Malformation and seizures. She is not able to walk independently and has difficulty gripping objects for prolonged periods of time. Limited information was known by the patient and her partner today regarding an underlying etiology for DWM in this relative. Dandy-Walker malformation involves the enlargement of the fourth ventricle, agenesis or hypoplasia of the cerebellar vermis, and enlargement of the posterior fossa. Many individuals with Dandy-Walker malformation also develop hydrocephalus. Dandy-Walker malformation has been associated with several different causes including single gene disorders, chromosome conditions, certain prenatal exposures, an autosomal recessive form, and sporadic occurrence. When sporadic, the estimated recurrence risk for siblings is approximately 1-5%. We discussed that given the degree of relation (a fourth degree relative to the pregnancy), recurrence risk would likely be low in the case of sporadic occurrence. However, additional information regarding the underlying etiology for this relative may alter recurrence risk assessment for relatives. We discussed that targeted ultrasound is available in pregnancy to assess fetal CNS anatomy. However, ultrasound  cannot diagnose or rule out all birth defects prenatally.   Additionally, Mr. Dipinto reported a female maternal first cousin (his maternal aunt's daughter) born with one eye missing. This relative is 26 years old and was described to have mild developmental delays, requiring special classes in school. She currently lives with a parent. She is otherwise healthy, and no information was known regarding the underlying etiology for her features. No additional relatives were reported with similar features. We discussed that the physical and mental features could be due to separate etiologies or due to a single underlying etiology. We discussed that range of possible etiologies including teratogens, sporadic, genetic, and multifactorial. We discussed that additional information is needed to accurately assess recurrence risk for relatives. Without further information regarding the provided family history, an accurate genetic risk cannot be calculated. Further genetic counseling is warranted if more information is obtained.  Ms. YASLYN CUMBY denied exposure to environmental toxins or chemical agents.  She denied the use of street drugs. She reported drinking alcohol on weekends, multiple drinks at a time, until being aware of the pregnancy.  She reported no alcohol use in pregnancy since approximately the middle of March.  Prenatal alcohol exposure can increase the risk for growth delays, small head size, heart defects, eye and facial differences, as well as behavior problems and learning disabilities. The risk of these to occur tends to increase with the amount of alcohol consumed. However, because there is no identified safe amount of alcohol in pregnancy, it is recommended to completely avoid alcohol in pregnancy. Given the reported amount of exposure, risk for associated effects are likely low in the current pregnancy. We reviewed that targeted ultrasound is available to assess fetal growth and development. She  reported smoking approximately a half pack of cigarettes per day. The  associations of smoking in pregnancy were reviewed and cessation encouraged.  She denied significant viral illnesses during the course of her pregnancy. Her medical and surgical histories were noncontributory.   I counseled this couple for approximately 45 minutes regarding the above risks and available options.     Quinn Plowman, MS,  Certified [employer] 12/12/2012

## 2012-12-13 ENCOUNTER — Ambulatory Visit (INDEPENDENT_AMBULATORY_CARE_PROVIDER_SITE_OTHER): Payer: Medicaid Other | Admitting: Nurse Practitioner

## 2012-12-13 ENCOUNTER — Encounter: Payer: Self-pay | Admitting: Nurse Practitioner

## 2012-12-13 VITALS — BP 100/68 | HR 81 | Ht 65.0 in | Wt 213.0 lb

## 2012-12-13 DIAGNOSIS — G43909 Migraine, unspecified, not intractable, without status migrainosus: Secondary | ICD-10-CM

## 2012-12-13 MED ORDER — PROMETHAZINE HCL 25 MG PO TABS: 25.0000 mg | ORAL_TABLET | Freq: Four times a day (QID) | ORAL | Status: DC | PRN

## 2012-12-13 MED ORDER — RIZATRIPTAN BENZOATE 10 MG PO TABS: 10.0000 mg | ORAL_TABLET | ORAL | Status: DC | PRN

## 2012-12-13 NOTE — Progress Notes (Signed)
Diagnosis: Migraine without Aura in Pregnancy  History: Pt is 14 weeks and 2 days pregnant today. G 2 P1. She [facility]. She was unable to continue her visits when she lost her Medicaid. She has 2 sisters that have migraine. She thinks she took Imitrex at [facility]. She is having significant nausea and did not get Zofran filled due to cost. Her Medicaid has not gone through as yet. She is smoker 1/2 pack down from 2 packs per day. She denies any significant stressors in her unplanned pregnancy.   Location: Bilateral Temples and Bilateral Occipital. R >L  Number of Headache days/month: Severe: 8 Moderate:5 Mild: daily  Current Outpatient Prescriptions on File Prior to Visit  Medication Sig Dispense Refill  . acetaminophen (TYLENOL) 325 MG tablet Take 650 mg by mouth every 6 (six) hours as needed. Pain      . calcium carbonate (TUMS EX) 750 MG chewable tablet Chew 2 tablets by mouth daily as needed for heartburn.      . hydrocortisone cream 0.5 % Apply topically 2 (two) times daily.  30 g  0  . Prenatal Vit-Fe Fumarate-FA (PRENATAL MULTIVITAMIN) TABS Take 1 tablet by mouth every morning.      Marland Kitchen albuterol (PROVENTIL HFA;VENTOLIN HFA) 108 (90 BASE) MCG/ACT inhaler Inhale 2 puffs into the lungs every 6 (six) hours as needed for wheezing.      . ondansetron (ZOFRAN ODT) 4 MG disintegrating tablet Take 1 tablet (4 mg total) by mouth every 6 (six) hours as needed for nausea.  20 tablet  0   No current facility-administered medications on file prior to visit.    Acute prevention: None/ taking 4 tylenol daily  Past Medical History  Diagnosis Date  . History of bronchitis 44yrs ago  . Migraine   . History of bleeding ulcers     told not to take ibuprofen   Past Surgical History  Procedure Laterality Date  . Tonsillectomy  03/10/2012    Procedure: TONSILLECTOMY;  Surgeon: Suzanna Obey, MD;  [facility];  Service: ENT;   Laterality: Bilateral;   Family History  Problem Relation Age of Onset  . Diabetes Father   . Heart disease Paternal Grandmother   . Cancer Paternal Grandfather     intestional cancer   Social History:  reports that she has been smoking Cigarettes.  She has a 6.5 pack-year smoking history. She does not have any smokeless tobacco history on file. She reports that she does not drink alcohol or use illicit drugs. Allergies:  Allergies  Allergen Reactions  . Codeine Other (See Comments)    Pt. States she had a seizure  . Ibuprofen     Patient was diagnosed with bleeding ulcers and advised not to take this medication.    Triggers: pregnancy, stress  Birth control: plans BTL after delivery  ROS: positive for migraine, nausea, denies any chest pain, rash, leg pains, contractions  Exam: General alert, oriented, NAD Cardiac: RRR Lungs: Clear Skin: Warm and dry  General: HEENT: Cardiac: Lungs: Neuro: Skin:  Impression:migraine - common  Plan: We discussed the pathophysiology of migraine and the risk benefit of medications in pregnancy. She is willing to accept risks. Today she will be given prednisone pack from office 20 mg 4 tabs x 1 day, 3 tabs x1 day, 2 tabsx1 day and 1 tab x1 day. She will be given phenergan to take for nausea. She will use ice  and other comfort measures discussed. If unable to control migraines she will use Maxalt.   Time Spent:45 min

## 2012-12-13 NOTE — Progress Notes (Signed)
New patient today for headache consult.  Headaches are worse and last longer since start of pregnancy. Having sharp pains in her stomach.

## 2012-12-13 NOTE — Patient Instructions (Signed)
Migraine Headache A migraine headache is an intense, throbbing pain on one or both sides of your head. A migraine can last for 30 minutes to several hours. CAUSES  The exact cause of a migraine headache is not always known. However, a migraine may be caused when nerves in the brain become irritated and release chemicals that cause inflammation. This causes pain. SYMPTOMS  Pain on one or both sides of your head.  Pulsating or throbbing pain.  Severe pain that prevents daily activities.  Pain that is aggravated by any physical activity.  Nausea, vomiting, or both.  Dizziness.  Pain with exposure to bright lights, loud noises, or activity.  General sensitivity to bright lights, loud noises, or smells. Before you get a migraine, you may get warning signs that a migraine is coming (aura). An aura may include:  Seeing flashing lights.  Seeing bright spots, halos, or zig-zag lines.  Having tunnel vision or blurred vision.  Having feelings of numbness or tingling.  Having trouble talking.  Having muscle weakness. MIGRAINE TRIGGERS  Alcohol.  Smoking.  Stress.  Menstruation.  Aged cheeses.  Foods or drinks that contain nitrates, glutamate, aspartame, or tyramine.  Lack of sleep.  Chocolate.  Caffeine.  Hunger.  Physical exertion.  Fatigue.  Medicines used to treat chest pain (nitroglycerine), birth control pills, estrogen, and some blood pressure medicines. DIAGNOSIS  A migraine headache is often diagnosed based on:  Symptoms.  Physical examination.  A CT scan or MRI of your head. TREATMENT Medicines may be given for pain and nausea. Medicines can also be given to help prevent recurrent migraines.  HOME CARE INSTRUCTIONS  Only take over-the-counter or prescription medicines for pain or discomfort as directed by your caregiver. The use of long-term narcotics is not recommended.  Lie down in a dark, quiet room when you have a migraine.  Keep a journal  to find out what may trigger your migraine headaches. For example, write down:  What you eat and drink.  How much sleep you get.  Any change to your diet or medicines.  Limit alcohol consumption.  Quit smoking if you smoke.  Get 7 to 9 hours of sleep, or as recommended by your caregiver.  Limit stress.  Keep lights dim if bright lights bother you and make your migraines worse. SEEK IMMEDIATE MEDICAL CARE IF:   Your migraine becomes severe.  You have a fever.  You have a stiff neck.  You have vision loss.  You have muscular weakness or loss of muscle control.  You start losing your balance or have trouble walking.  You feel faint or pass out.  You have severe symptoms that are different from your first symptoms. MAKE SURE YOU:   Understand these instructions.  Will watch your condition.  Will get help right away if you are not doing well or get worse. Document Released: 08/03/2005 Document Revised: 10/26/2011 Document Reviewed: 07/24/2011 [employer] Patient Information 2013 [employer].  

## 2012-12-19 ENCOUNTER — Encounter: Payer: Self-pay | Admitting: Obstetrics & Gynecology

## 2012-12-19 ENCOUNTER — Ambulatory Visit (INDEPENDENT_AMBULATORY_CARE_PROVIDER_SITE_OTHER): Payer: Medicaid Other | Admitting: Obstetrics & Gynecology

## 2012-12-19 VITALS — BP 104/65 | Wt 215.0 lb

## 2012-12-19 DIAGNOSIS — Z3480 Encounter for supervision of other normal pregnancy, unspecified trimester: Secondary | ICD-10-CM

## 2012-12-19 NOTE — Progress Notes (Signed)
Routine visit. No FM yet. No VB,ROM, CTXs.  She does have round ligament pain. Reassurance given, rec Tylenol. Anatomy u/s on 01-10-13. She will need her Quad screen at next visit along with early glucola. I have rec less than 25# weight gain during this pregnancy. She would like a PPS (discussed signing PPS for MCD at 28 week labs.)

## 2012-12-19 NOTE — Progress Notes (Signed)
Patient says that Maxalt did not help her headache and actually seemed to make it worse.

## 2012-12-21 ENCOUNTER — Encounter (HOSPITAL_COMMUNITY): Payer: Self-pay

## 2012-12-22 ENCOUNTER — Telehealth (HOSPITAL_COMMUNITY): Payer: Self-pay | Admitting: MS"

## 2012-12-22 NOTE — Telephone Encounter (Signed)
Called Robyn Ortiz to discuss her [facility], cell free fetal DNA testing. Patient was identified by name and DOB. Testing was offered because of an increased risk for Down syndrome. We reviewed that these are within normal limits, showing a less than 1 in 10,000 risk for trisomies 21, 18 and 13. We reviewed that this testing identifies > 99% of pregnancies with trisomy 21, >98% of pregnancies with trisomy 55, and >80% with trisomy 49; the false positive rate is <0.1% for all conditions. She understands that this testing does not identify all genetic conditions. All questions were answered to her satisfaction, she was encouraged to call with additional questions or concerns.  Mady Gemma, MS Certified Genetic Counselor

## 2012-12-22 NOTE — Telephone Encounter (Signed)
Called patient to review normal results of her cell free fetal DNA test.  Left a message for her to return my call.

## 2013-01-04 ENCOUNTER — Other Ambulatory Visit: Payer: Self-pay | Admitting: Obstetrics and Gynecology

## 2013-01-04 DIAGNOSIS — F17210 Nicotine dependence, cigarettes, uncomplicated: Secondary | ICD-10-CM

## 2013-01-04 DIAGNOSIS — Z0489 Encounter for examination and observation for other specified reasons: Secondary | ICD-10-CM

## 2013-01-10 ENCOUNTER — Ambulatory Visit (HOSPITAL_COMMUNITY)
Admission: RE | Admit: 2013-01-10 | Discharge: 2013-01-10 | Disposition: A | Discharge status: Home / Self Care | Payer: Medicaid Other | Source: Ambulatory Visit | Attending: Obstetrics & Gynecology | Admitting: Obstetrics & Gynecology

## 2013-01-10 ENCOUNTER — Encounter (HOSPITAL_COMMUNITY): Payer: Self-pay

## 2013-01-10 VITALS — BP 101/58 | HR 63 | Wt 216.5 lb

## 2013-01-10 DIAGNOSIS — Z0489 Encounter for examination and observation for other specified reasons: Secondary | ICD-10-CM

## 2013-01-10 DIAGNOSIS — F17210 Nicotine dependence, cigarettes, uncomplicated: Secondary | ICD-10-CM

## 2013-01-10 DIAGNOSIS — O99330 Smoking (tobacco) complicating pregnancy, unspecified trimester: Secondary | ICD-10-CM | POA: Insufficient documentation

## 2013-01-10 DIAGNOSIS — O4402 Placenta previa specified as without hemorrhage, second trimester: Secondary | ICD-10-CM

## 2013-01-10 DIAGNOSIS — E669 Obesity, unspecified: Secondary | ICD-10-CM | POA: Insufficient documentation

## 2013-01-10 DIAGNOSIS — O289 Unspecified abnormal findings on antenatal screening of mother: Secondary | ICD-10-CM | POA: Insufficient documentation

## 2013-01-11 ENCOUNTER — Encounter: Payer: Self-pay | Admitting: Obstetrics and Gynecology

## 2013-01-11 DIAGNOSIS — O4452 Low lying placenta with hemorrhage, second trimester: Secondary | ICD-10-CM | POA: Insufficient documentation

## 2013-01-16 ENCOUNTER — Ambulatory Visit (INDEPENDENT_AMBULATORY_CARE_PROVIDER_SITE_OTHER): Payer: Medicaid Other | Admitting: Obstetrics & Gynecology

## 2013-01-16 ENCOUNTER — Encounter: Payer: Self-pay | Admitting: Obstetrics & Gynecology

## 2013-01-16 VITALS — BP 89/63 | Wt 215.0 lb

## 2013-01-16 DIAGNOSIS — Z3480 Encounter for supervision of other normal pregnancy, unspecified trimester: Secondary | ICD-10-CM

## 2013-01-16 NOTE — Progress Notes (Signed)
Routine visit. AFP today. She had a normal anatomy scan except low lying placenta and poorly visualized heart. A fetal echo is being scheduled. She has symptoms of a cold. I have recommended OTC relief. She denies fever. Early glucola today. Good FM.

## 2013-01-17 ENCOUNTER — Encounter: Payer: Self-pay | Admitting: Obstetrics & Gynecology

## 2013-01-17 LAB — GLUCOSE TOLERANCE, 1 HOUR (50G) W/O FASTING: Glucose, 1 Hour GTT: 106 mg/dL (ref 70–140)

## 2013-01-18 ENCOUNTER — Encounter: Payer: Self-pay | Admitting: Obstetrics & Gynecology

## 2013-01-18 LAB — ALPHA FETOPROTEIN, MATERNAL
MoM for AFP: 0.69
Osb Risk: <1:27300 {titer}

## 2013-02-14 ENCOUNTER — Encounter: Payer: Self-pay | Admitting: Obstetrics and Gynecology

## 2013-02-14 ENCOUNTER — Ambulatory Visit (INDEPENDENT_AMBULATORY_CARE_PROVIDER_SITE_OTHER): Payer: Medicaid Other | Admitting: Obstetrics and Gynecology

## 2013-02-14 VITALS — BP 128/70 | Wt 219.0 lb

## 2013-02-14 DIAGNOSIS — Z3480 Encounter for supervision of other normal pregnancy, unspecified trimester: Secondary | ICD-10-CM

## 2013-02-14 DIAGNOSIS — F17200 Nicotine dependence, unspecified, uncomplicated: Secondary | ICD-10-CM

## 2013-02-14 DIAGNOSIS — E669 Obesity, unspecified: Secondary | ICD-10-CM

## 2013-02-14 DIAGNOSIS — O4410 Placenta previa with hemorrhage, unspecified trimester: Secondary | ICD-10-CM

## 2013-02-14 DIAGNOSIS — O99214 Obesity complicating childbirth: Secondary | ICD-10-CM

## 2013-02-14 DIAGNOSIS — O4452 Low lying placenta with hemorrhage, second trimester: Secondary | ICD-10-CM

## 2013-02-14 DIAGNOSIS — Z3482 Encounter for supervision of other normal pregnancy, second trimester: Secondary | ICD-10-CM

## 2013-02-14 DIAGNOSIS — J45909 Unspecified asthma, uncomplicated: Secondary | ICD-10-CM

## 2013-02-14 MED ORDER — IMIQUIMOD 5 % EX CREA: TOPICAL_CREAM | CUTANEOUS | Status: DC

## 2013-02-14 NOTE — Progress Notes (Signed)
Patient doing well without any obstetrical complaints. 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 Due to the location of the condyloma, expressed pain control issues with TCA treatment. Offered Aldara which I explained is category C in pregnancy. Patient desires to try aldara cream as she wants to start treatment for them. Rx provided

## 2013-02-14 NOTE — Progress Notes (Signed)
P - 94 - Pt states needs TCA Tx today

## 2013-02-21 ENCOUNTER — Ambulatory Visit (HOSPITAL_COMMUNITY)
Admission: RE | Admit: 2013-02-21 | Discharge: 2013-02-21 | Disposition: A | Discharge status: Home / Self Care | Payer: Medicaid Other | Source: Ambulatory Visit | Attending: Obstetrics & Gynecology | Admitting: Obstetrics & Gynecology

## 2013-02-21 ENCOUNTER — Other Ambulatory Visit (HOSPITAL_COMMUNITY): Payer: Self-pay | Admitting: Maternal and Fetal Medicine

## 2013-02-21 DIAGNOSIS — O99210 Obesity complicating pregnancy, unspecified trimester: Secondary | ICD-10-CM | POA: Insufficient documentation

## 2013-02-21 DIAGNOSIS — Z0489 Encounter for examination and observation for other specified reasons: Secondary | ICD-10-CM

## 2013-02-21 DIAGNOSIS — E669 Obesity, unspecified: Secondary | ICD-10-CM | POA: Insufficient documentation

## 2013-02-21 DIAGNOSIS — O4400 Placenta previa specified as without hemorrhage, unspecified trimester: Secondary | ICD-10-CM | POA: Insufficient documentation

## 2013-02-21 DIAGNOSIS — O4402 Placenta previa specified as without hemorrhage, second trimester: Secondary | ICD-10-CM

## 2013-02-21 DIAGNOSIS — O289 Unspecified abnormal findings on antenatal screening of mother: Secondary | ICD-10-CM | POA: Insufficient documentation

## 2013-02-21 DIAGNOSIS — O99330 Smoking (tobacco) complicating pregnancy, unspecified trimester: Secondary | ICD-10-CM | POA: Insufficient documentation

## 2013-03-03 DIAGNOSIS — O280 Abnormal hematological finding on antenatal screening of mother: Secondary | ICD-10-CM | POA: Insufficient documentation

## 2013-03-07 ENCOUNTER — Encounter: Payer: Medicaid Other | Admitting: Obstetrics & Gynecology

## 2013-03-13 ENCOUNTER — Encounter: Payer: Medicaid Other | Admitting: Obstetrics & Gynecology

## 2013-03-21 ENCOUNTER — Ambulatory Visit (INDEPENDENT_AMBULATORY_CARE_PROVIDER_SITE_OTHER): Payer: Medicaid Other | Admitting: Family Medicine

## 2013-03-21 VITALS — BP 107/65 | Wt 221.0 lb

## 2013-03-21 DIAGNOSIS — O4410 Placenta previa with hemorrhage, unspecified trimester: Secondary | ICD-10-CM

## 2013-03-21 DIAGNOSIS — Z3483 Encounter for supervision of other normal pregnancy, third trimester: Secondary | ICD-10-CM

## 2013-03-21 DIAGNOSIS — O4452 Low lying placenta with hemorrhage, second trimester: Secondary | ICD-10-CM

## 2013-03-21 DIAGNOSIS — Z3480 Encounter for supervision of other normal pregnancy, unspecified trimester: Secondary | ICD-10-CM

## 2013-03-21 NOTE — Progress Notes (Signed)
Needs 28 wk labs-she will return tomorrow for labs.

## 2013-03-21 NOTE — Patient Instructions (Signed)
Pregnancy - Third Trimester The third trimester of pregnancy (the last 3 months) is a period of the most rapid growth for you and your baby. The baby approaches a length of 20 inches and a weight of 6 to 10 pounds. The baby is adding on fat and getting ready for life outside your body. While inside, babies have periods of sleeping and waking, sucking thumbs, and hiccuping. You can often feel small contractions of the uterus. This is false labor. It is also called Braxton-Hicks contractions. This is like a practice for labor. The usual problems in this stage of pregnancy include more difficulty breathing, swelling of the hands and feet from water retention, and having to urinate more often because of the uterus and baby pressing on your bladder.  PRENATAL EXAMS  Blood work may continue to be done during prenatal exams. These tests are done to check on your health and the probable health of your baby. Blood work is used to follow your blood levels (hemoglobin). Anemia (low hemoglobin) is common during pregnancy. Iron and vitamins are given to help prevent this. You may also continue to be checked for diabetes. Some of the past blood tests may be done again.  The size of the uterus is measured during each visit. This makes sure your baby is growing properly according to your pregnancy dates.  Your blood pressure is checked every prenatal visit. This is to make sure you are not getting toxemia.  Your urine is checked every prenatal visit for infection, diabetes, and protein.  Your weight is checked at each visit. This is done to make sure gains are happening at the suggested rate and that you and your baby are growing normally.  Sometimes, an ultrasound is performed to confirm the position and the proper growth and development of the baby. This is a test done that bounces harmless sound waves off the baby so your caregiver can more accurately determine a due date.  Discuss the type of pain medicine and  anesthesia you will have during your labor and delivery.  Discuss the possibility and anesthesia if a cesarean section might be necessary.  Inform your caregiver if there is any mental or physical violence at home. Sometimes, a specialized non-stress test, contraction stress test, and biophysical profile are done to make sure the baby is not having a problem. Checking the amniotic fluid surrounding the baby is called an amniocentesis. The amniotic fluid is removed by sticking a needle into the belly (abdomen). This is sometimes done near the end of pregnancy if an early delivery is required. In this case, it is done to help make sure the baby's lungs are mature enough for the baby to live outside of the womb. If the lungs are not mature and it is unsafe to deliver the baby, an injection of cortisone medicine is given to the mother 1 to 2 days before the delivery. This helps the baby's lungs mature and makes it safer to deliver the baby. CHANGES OCCURING IN THE THIRD TRIMESTER OF PREGNANCY Your body goes through many changes during pregnancy. They vary from person to person. Talk to your caregiver about changes you notice and are concerned about.  During the last trimester, you have probably had an increase in your appetite. It is normal to have cravings for certain foods. This varies from person to person and pregnancy to pregnancy.  You may begin to get stretch marks on your hips, abdomen, and breasts. These are normal changes in the body   during pregnancy. There are no exercises or medicines to take which prevent this change.  Constipation may be treated with a stool softener or adding bulk to your diet. Drinking lots of fluids, fiber in vegetables, fruits, and whole grains are helpful.  Exercising is also helpful. If you have been very active up until your pregnancy, most of these activities can be continued during your pregnancy. If you have been less active, it is helpful to start an exercise  program such as walking. Consult your caregiver before starting exercise programs.  Avoid all smoking, alcohol, non-prescribed drugs, herbs and "street drugs" during your pregnancy. These chemicals affect the formation and growth of the baby. Avoid chemicals throughout the pregnancy to ensure the delivery of a healthy infant.  Backache, varicose veins, and hemorrhoids may develop or get worse.  You will tire more easily in the third trimester, which is normal.  The baby's movements may be stronger and more often.  You may become short of breath easily.  Your belly button may stick out.  A yellow discharge may leak from your breasts called colostrum.  You may have a bloody mucus discharge. This usually occurs a few days to a week before labor begins. HOME CARE INSTRUCTIONS   Keep your caregiver's appointments. Follow your caregiver's instructions regarding medicine use, exercise, and diet.  During pregnancy, you are providing food for you and your baby. Continue to eat regular, well-balanced meals. Choose foods such as meat, fish, milk and other low fat dairy products, vegetables, fruits, and whole-grain breads and cereals. Your caregiver will tell you of the ideal weight gain.  A physical sexual relationship may be continued throughout pregnancy if there are no other problems such as early (premature) leaking of amniotic fluid from the membranes, vaginal bleeding, or belly (abdominal) pain.  Exercise regularly if there are no restrictions. Check with your caregiver if you are unsure of the safety of your exercises. Greater weight gain will occur in the last 2 trimesters of pregnancy. Exercising helps:  Control your weight.  Get you in shape for labor and delivery.  You lose weight after you deliver.  Rest a lot with legs elevated, or as needed for leg cramps or low back pain.  Wear a good support or jogging bra for breast tenderness during pregnancy. This may help if worn during  sleep. Pads or tissues may be used in the bra if you are leaking colostrum.  Do not use hot tubs, steam rooms, or saunas.  Wear your seat belt when driving. This protects you and your baby if you are in an accident.  Avoid raw meat, cat litter boxes and soil used by cats. These carry germs that can cause birth defects in the baby.  It is easier to leak urine during pregnancy. Tightening up and strengthening the pelvic muscles will help with this problem. You can practice stopping your urination while you are going to the bathroom. These are the same muscles you need to strengthen. It is also the muscles you would use if you were trying to stop from passing gas. You can practice tightening these muscles up 10 times a set and repeating this about 3 times per day. Once you know what muscles to tighten up, do not perform these exercises during urination. It is more likely to cause an infection by backing up the urine.  Ask for help if you have financial, counseling, or nutritional needs during pregnancy. Your caregiver will be able to offer counseling for these   needs as well as refer you for other special needs.  Make a list of emergency phone numbers and have them available.  Plan on getting help from family or friends when you go home from the hospital.  Make a trial run to the hospital.  Take prenatal classes with the father to understand, practice, and ask questions about the labor and delivery.  Prepare the baby's room or nursery.  Do not travel out of the city unless it is absolutely necessary and with the advice of your caregiver.  Wear only low or no heal shoes to have better balance and prevent falling. MEDICINES AND DRUG USE IN PREGNANCY  Take prenatal vitamins as directed. The vitamin should contain 1 milligram of folic acid. Keep all vitamins out of reach of children. Only a couple vitamins or tablets containing iron may be fatal to a baby or young child when ingested.  Avoid use  of all medicines, including herbs, over-the-counter medicines, not prescribed or suggested by your caregiver. Only take over-the-counter or prescription medicines for pain, discomfort, or fever as directed by your caregiver. Do not use aspirin, ibuprofen or naproxen unless approved by your caregiver.  Let your caregiver also know about herbs you may be using.  Alcohol is related to a number of birth defects. This includes fetal alcohol syndrome. All alcohol, in any form, should be avoided completely. Smoking will cause low birth rate and premature babies.  Illegal drugs are very harmful to the baby. They are absolutely forbidden. A baby born to an addicted mother will be addicted at birth. The baby will go through the same withdrawal an adult does. SEEK MEDICAL CARE IF: You have any concerns or worries during your pregnancy. It is better to call with your questions if you feel they cannot wait, rather than worry about them. SEEK IMMEDIATE MEDICAL CARE IF:   An unexplained oral temperature above 102 F (38.9 C) develops, or as your caregiver suggests.  You have leaking of fluid from the vagina. If leaking membranes are suspected, take your temperature and tell your caregiver of this when you call.  There is vaginal spotting, bleeding or passing clots. Tell your caregiver of the amount and how many pads are used.  You develop a bad smelling vaginal discharge with a change in the color from clear to white.  You develop vomiting that lasts more than 24 hours.  You develop chills or fever.  You develop shortness of breath.  You develop burning on urination.  You loose more than 2 pounds of weight or gain more than 2 pounds of weight or as suggested by your caregiver.  You notice sudden swelling of your face, hands, and feet or legs.  You develop belly (abdominal) pain. Round ligament discomfort is a common non-cancerous (benign) cause of abdominal pain in pregnancy. Your caregiver still  must evaluate you.  You develop a severe headache that does not go away.  You develop visual problems, blurred or double vision.  If you have not felt your baby move for more than 1 hour. If you think the baby is not moving as much as usual, eat something with sugar in it and lie down on your left side for an hour. The baby should move at least 4 to 5 times per hour. Call right away if your baby moves less than that.  You fall, are in a car accident, or any kind of trauma.  There is mental or physical violence at home. Document Released: 07/28/2001   Document Revised: 04/27/2012 Document Reviewed: 01/30/2009 [employer] Patient Information 2014 [employer].  Breastfeeding A change in hormones during your pregnancy causes growth of your breast tissue and an increase in number and size of milk ducts. The hormone prolactin allows proteins, sugars, and fats from your blood supply to make breast milk in your milk-producing glands. The hormone progesterone prevents breast milk from being released before the birth of your baby. After the birth of your baby, your progesterone level decreases allowing breast milk to be released. Thoughts of your baby, as well as his or her sucking or crying, can stimulate the release of milk from the milk-producing glands. Deciding to breastfeed (nurse) is one of the best choices you can make for you and your baby. The information that follows gives a brief review of the benefits, as well as other important skills to know about breastfeeding. BENEFITS OF BREASTFEEDING For your baby  The first milk (colostrum) helps your baby's digestive system function better.   There are antibodies in your milk that help your baby fight off infections.   Your baby has a lower incidence of asthma, allergies, and sudden infant death syndrome (SIDS).   The nutrients in breast milk are better for your baby than infant formulas.  Breast milk improves your baby's brain development.    Your baby will have less gas, colic, and constipation.  Your baby is less likely to develop other conditions, such as childhood obesity, asthma, or diabetes mellitus. For you  Breastfeeding helps develop a very special bond between you and your baby.   Breastfeeding is convenient, always available at the correct temperature, and costs nothing.   Breastfeeding helps to burn calories and helps you lose the weight gained during pregnancy.   Breastfeeding makes your uterus contract back down to normal size faster and slows bleeding following delivery.   Breastfeeding mothers have a lower risk of developing osteoporosis or breast or ovarian cancer later in life.  BREASTFEEDING FREQUENCY  A healthy, full-term baby may breastfeed as often as every hour or space his or her feedings to every 3 hours. Breastfeeding frequency will vary from baby to baby.   Newborns should be fed no less than every 2 3 hours during the day and every 4 5 hours during the night. You should breastfeed a minimum of 8 feedings in a 24 hour period.  Awaken your baby to breastfeed if it has been 3 4 hours since the last feeding.  Breastfeed when you feel the need to reduce the fullness of your breasts or when your newborn shows signs of hunger. Signs that your baby may be hungry include:  Increased alertness or activity.  Stretching.  Movement of the head from side to side.  Movement of the head and opening of the mouth when the corner of the mouth or cheek is stroked (rooting).  Increased sucking sounds, smacking lips, cooing, sighing, or squeaking.  Hand-to-mouth movements.  Increased sucking of fingers or hands.  Fussing.  Intermittent crying.  Signs of extreme hunger will require calming and consoling before you try to feed your baby. Signs of extreme hunger may include:  Restlessness.  A loud, strong cry.  Screaming.  Frequent feeding will help you make more milk and will help prevent  problems, such as sore nipples and engorgement of the breasts.  BREASTFEEDING   Whether lying down or sitting, be sure that the baby's abdomen is facing your abdomen.   Support your breast with 4 fingers under your breast   and your thumb above your nipple. Make sure your fingers are well away from your nipple and your baby's mouth.   Stroke your baby's lips gently with your finger or nipple.   When your baby's mouth is open wide enough, place all of your nipple and as much of the colored area around your nipple (areola) as possible into your baby's mouth.  More areola should be visible above his or her upper lip than below his or her lower lip.  Your baby's tongue should be between his or her lower gum and your breast.  Ensure that your baby's mouth is correctly positioned around the nipple (latched). Your baby's lips should create a seal on your breast.  Signs that your baby has effectively latched onto your nipple include:  Tugging or sucking without pain.  Swallowing heard between sucks.  Absent click or smacking sound.  Muscle movement above and in front of his or her ears with sucking.  Your baby must suck about 2 3 minutes in order to get your milk. Allow your baby to feed on each breast as long as he or she wants. Nurse your baby until he or she unlatches or falls asleep at the first breast, then offer the second breast.  Signs that your baby is full and satisfied include:  A gradual decrease in the number of sucks or complete cessation of sucking.  Falling asleep.  Extension or relaxation of his or her body.  Retention of a small amount of milk in his or her mouth.  Letting go of your breast by himself or herself.  Signs of effective breastfeeding in you include:  Breasts that have increased firmness, weight, and size prior to feeding.  Breasts that are softer after nursing.  Increased milk volume, as well as a change in milk consistency and color by the 5th  day of breastfeeding.  Breast fullness relieved by breastfeeding.  Nipples are not sore, cracked, or bleeding.  If needed, break the suction by putting your finger into the corner of your baby's mouth and sliding your finger between his or her gums. Then, remove your breast from his or her mouth.  It is common for babies to spit up a small amount after a feeding.  Babies often swallow air during feeding. This can make babies fussy. Burping your baby between breasts can help with this.  Vitamin D supplements are recommended for babies who get only breast milk.  Avoid using a pacifier during your baby's first 4 6 weeks.  Avoid supplemental feedings of water, formula, or juice in place of breastfeeding. Breast milk is all the food your baby needs. It is not necessary for your baby to have water or formula. Your breasts will make more milk if supplemental feedings are avoided during the early weeks. HOW TO TELL WHETHER YOUR BABY IS GETTING ENOUGH BREAST MILK Wondering whether or not your baby is getting enough milk is a common concern among mothers. You can be assured that your baby is getting enough milk if:   Your baby is actively sucking and you hear swallowing.   Your baby seems relaxed and satisfied after a feeding.   Your baby nurses at least 8 12 times in a 24 hour time period.  During the first 3 5 days of age:  Your baby is wetting at least 3 5 diapers in a 24 hour period. The urine should be clear and pale yellow.  Your baby is having at least 3 4 stools in   a 24 hour period. The stool should be soft and yellow.  At 5 7 days of age, your baby is having at least 3 6 stools in a 24 hour period. The stool should be seedy and yellow by 5 days of age.  Your baby has a weight loss less than 7 10% during the first 3 days of age.  Your baby does not lose weight after 3 7 days of age.  Your baby gains 4 7 ounces each week after he or she is 4 days of age.  Your baby gains weight  by 5 days of age and is back to birth weight within 2 weeks. ENGORGEMENT In the first week after your baby is born, you may experience extremely full breasts (engorgement). When engorged, your breasts may feel heavy, warm, or tender to the touch. Engorgement peaks within 24 48 hours after delivery of your baby.  Engorgement may be reduced by:  Continuing to breastfeed.  Increasing the frequency of breastfeeding.  Taking warm showers or applying warm, moist heat to your breasts just before each feeding. This increases circulation and helps the milk flow.   Gently massaging your breast before and during the feedings. With your fingertips, massage from your chest wall towards your nipple in a circular motion.   Ensuring that your baby empties at least one breast at every feeding. It also helps to start the next feeding on the opposite breast.   Expressing breast milk by hand or by using a breast pump to empty the breasts if your baby is sleepy, or not nursing well. You may also want to express milk if you are returning to work oryou feel you are getting engorged.  Ensuring your baby is latched on and positioned properly while breastfeeding. If you follow these suggestions, your engorgement should improve in 24 48 hours. If you are still experiencing difficulty, call your lactation consultant or caregiver.  CARING FOR YOURSELF Take care of your breasts.  Bathe or shower daily.   Avoid using soap on your nipples.   Wear a supportive bra. Avoid wearing underwire style bras.  Air dry your nipples for a 3 4minutes after each feeding.   Use only cotton bra pads to absorb breast milk leakage. Leaking of breast milk between feedings is normal.   Use only pure lanolin on your nipples after nursing. You do not need to wash it off before feeding your baby again. Another option is to express a few drops of breast milk and gently massage that milk into your nipples.  Continue breast  self-awareness checks. Take care of yourself.  Eat healthy foods. Alternate 3 meals with 3 snacks.  Avoid foods that you notice affect your baby in a bad way.  Drink milk, fruit juice, and water to satisfy your thirst (about 8 glasses a day).   Rest often, relax, and take your prenatal vitamins to prevent fatigue, stress, and anemia.  Avoid chewing and smoking tobacco.  Avoid alcohol and drug use.  Take over-the-counter and prescribed medicine only as directed by your caregiver or pharmacist. You should always check with your caregiver or pharmacist before taking any new medicine, vitamin, or herbal supplement.  Know that pregnancy is possible while breastfeeding. If desired, talk to your caregiver about family planning and safe birth control methods that may be used while breastfeeding. SEEK MEDICAL CARE IF:   You feel like you want to stop breastfeeding or have become frustrated with breastfeeding.  You have painful breasts or nipples.    Your nipples are cracked or bleeding.  Your breasts are red, tender, or warm.  You have a swollen area on either breast.  You have a fever or chills.  You have nausea or vomiting.  You have drainage from your nipples.  Your breasts do not become full before feedings by the 5th day after delivery.  You feel sad and depressed.  Your baby is too sleepy to eat well.  Your baby is having trouble sleeping.   Your baby is wetting less than 3 diapers in a 24 hour period.  Your baby has less than 3 stools in a 24 hour period.  Your baby's skin or the white part of his or her eyes becomes more yellow.   Your baby is not gaining weight by 5 days of age. MAKE SURE YOU:   Understand these instructions.  Will watch your condition.  Will get help right away if you are not doing well or get worse. Document Released: 08/03/2005 Document Revised: 04/27/2012 Document Reviewed: 03/09/2012 [employer] Patient Information 2014 [employer].  

## 2013-03-21 NOTE — Progress Notes (Signed)
[id number] 

## 2013-03-23 ENCOUNTER — Other Ambulatory Visit (INDEPENDENT_AMBULATORY_CARE_PROVIDER_SITE_OTHER): Payer: Medicaid Other | Admitting: *Deleted

## 2013-03-23 DIAGNOSIS — Z3482 Encounter for supervision of other normal pregnancy, second trimester: Secondary | ICD-10-CM

## 2013-03-23 DIAGNOSIS — Z3480 Encounter for supervision of other normal pregnancy, unspecified trimester: Secondary | ICD-10-CM

## 2013-03-23 LAB — CBC WITH DIFFERENTIAL/PLATELET
Basophils Relative: 0 % (ref 0–1)
Hemoglobin: 11.1 g/dL — ABNORMAL LOW (ref 12.0–15.0)
MCHC: 33.6 g/dL (ref 30.0–36.0)
Monocytes Relative: 8 % (ref 3–12)
Neutro Abs: 4.9 10*3/uL (ref 1.7–7.7)
Neutrophils Relative %: 69 % (ref 43–77)
Platelets: 237 10*3/uL (ref 150–400)
RBC: 3.62 MIL/uL — ABNORMAL LOW (ref 3.87–5.11)

## 2013-03-24 LAB — RPR

## 2013-03-24 LAB — GLUCOSE TOLERANCE, 1 HOUR (50G) W/O FASTING: Glucose, 1 Hour GTT: 60 mg/dL — ABNORMAL LOW (ref 70–140)

## 2013-03-24 LAB — HIV ANTIBODY (ROUTINE TESTING W REFLEX): HIV: NONREACTIVE

## 2013-03-27 ENCOUNTER — Encounter: Payer: Self-pay | Admitting: Obstetrics and Gynecology

## 2013-04-04 ENCOUNTER — Ambulatory Visit (INDEPENDENT_AMBULATORY_CARE_PROVIDER_SITE_OTHER): Payer: Medicaid Other | Admitting: Obstetrics & Gynecology

## 2013-04-04 VITALS — BP 106/71 | Wt 224.0 lb

## 2013-04-04 DIAGNOSIS — A630 Anogenital (venereal) warts: Secondary | ICD-10-CM

## 2013-04-04 DIAGNOSIS — Z3480 Encounter for supervision of other normal pregnancy, unspecified trimester: Secondary | ICD-10-CM

## 2013-04-04 DIAGNOSIS — Z302 Encounter for sterilization: Secondary | ICD-10-CM

## 2013-04-04 DIAGNOSIS — Z3483 Encounter for supervision of other normal pregnancy, third trimester: Secondary | ICD-10-CM

## 2013-04-04 DIAGNOSIS — Z23 Encounter for immunization: Secondary | ICD-10-CM

## 2013-04-04 MED ORDER — IMIQUIMOD 5 % EX CREA: TOPICAL_CREAM | CUTANEOUS | Status: DC

## 2013-04-04 MED ORDER — TETANUS-DIPHTH-ACELL PERTUSSIS 5-2.5-18.5 LF-MCG/0.5 IM SUSP: 0.5000 mL | Freq: Once | INTRAMUSCULAR | Status: DC

## 2013-04-04 NOTE — Progress Notes (Signed)
P= [phone]  

## 2013-04-04 NOTE — Patient Instructions (Addendum)
Return to clinic for any obstetric concerns or go to [facility] for evaluation Postpartum Tubal Ligation A postpartum tubal ligation (PPTL) is a procedure that blocks the fallopian tubes right after childbirth or 1-2 days after childbirth. PPTL is done before the uterus returns to its normal location. The procedure is also called a minilaparotomy. By blocking the fallopian tubes, the eggs that are released from the ovaries cannot enter the uterus and sperm cannot reach the egg. A PPTL is done so you will not be able to get pregnant or have a baby.  Although this procedure may be reversed, it should be considered permanent and irreversible. If you want to have future pregnancies, you should not have this procedure. LET YOUR CAREGIVER KNOW ABOUT:  Allergies to food or medicine.  Medicines taken, including vitamins, herbs, eyedrops, over-the-counter medicines, and creams.  Use of steroids (by mouth or creams).  Previous problems with numbing medicines.  History of bleeding problems or blood clots.  Any recent colds or infections.  Previous surgery.  Other health problems, including diabetes and kidney problems. RISKS AND COMPLICATIONS  Infection.  Bleeding.  Injury to other organs.  Anesthetic side effects.  Failure of the procedure.  Ectopic pregnancy.  Future regret about having the procedure done. BEFORE THE PROCEDURE   You may need to sign certain permission forms with your insurance up to 30 days before your due date.  After delivering your baby, you cannot eat or drink anything if the procedure is performed the same day. If you are having the procedure a day after delivering, you may be able to eat and drink until midnight. Your caregiver will give you specific directions depending on your situation. PROCEDURE   If done 1 day after delivery:  You will be given a medicine to make you help you not feel pain (epidural or spinal) during the procedure.  A tube will be put down  your throat to help your breath while under general anesthesia.  A small cut (incision) is made just beneath the belly button.  The fallopian tubes are brought up through the incision.  The fallopian tubes are then sealed with a Filshie clip, tied, or cut.  If done after a caesarean delivery:  Tubal ligation is done through the incision already made (after the baby is delivered).  Once the tubes are blocked, the incision is closed with stitches (sutures). A bandage will be placed over the incisions. AFTER THE PROCEDURE  You may have some pain or cramps in the abdominal area for the next 3-7 days.  You will be given pain medicine to ease any discomfort.  You may also feel sick to your stomach if you were given a general anesthetic.  You may feel tired and should rest the remainder of the day. Document Released: 08/03/2005 Document Revised: 02/02/2012 Document Reviewed: 11/14/2011 [facility] Patient Information 2014 [place], [place]. Tetanus, Diphtheria (Td) or Tetanus, Diphtheria, Pertussis (Tdap) Vaccine What You Need to Know WHY GET VACCINATED? Tetanus, diphtheria and pertussis can be very serious diseases. TETANUS (Lockjaw) causes painful muscle spasms and stiffness, usually all over the body.  Tetanus can lead to tightening of muscles in the head and neck so the victim cannot open his mouth or swallow, or sometimes even breathe. Tetanus kills about 1 out of 5 people who are infected. DIPHTHERIA can cause a thick membrane to cover the back of the throat.  Diphtheria can lead to breathing problems, paralysis, heart failure, and even death. PERTUSSIS (Whooping Cough) causes severe coughing  spells which can lead to difficulty breathing, vomiting, and disturbed sleep.  Pertussis can lead to weight loss, incontinence, rib fractures and passing out from violent coughing. Up to 2 in 100 adolescents and 5 in 100 adults with pertussis are hospitalized or have complications, including  pneumonia and death. These 3 diseases are all caused by bacteria. Diphtheria and pertussis are spread from person to person. Tetanus enters the body through cuts, scratches, or wounds. The [place] saw as many as 200,000 cases a year of diphtheria and pertussis before vaccines were available, and hundreds of cases of tetanus. Since then, tetanus and diphtheria cases have dropped by about 99% and pertussis cases by about 92%. Children 31 years of age and younger get DTaP vaccine to protect them from these three diseases. But older children, adolescents, and adults need protection too. VACCINES FOR ADOLESCENTS AND ADULTS: TD AND TDAP Two vaccines are available to protect people 70 years of age and older from these diseases:  Td vaccine has been used for many years. It protects against tetanus and diphtheria.  Tdap vaccine was licensed in 2005. It is the first vaccine for adolescents and adults that protects against pertussis as well as tetanus and diphtheria. A Td booster dose is recommended every 10 years. Tdap is given only once. WHICH VACCINE, AND WHEN? Ages 7 through 18 years  A dose of Tdap is recommended at age 17 or 44. This dose could be given as early as age 28 for children who missed one or more childhood doses of DTaP.  Children and adolescents who did not get a complete series of DTaP shots by age 61 should complete the series using a combination of Td and Tdap. Age 35 years and Older  All adults should get a booster dose of Td every 10 years. Adults under 65 who have never gotten Tdap should get a dose of Tdap as their next booster dose. Adults 65 and older may get one booster dose of Tdap.  Adults (including women who may become pregnant and adults 41 and older) who expect to have close contact with a baby younger than 32 months of age should get a dose of Tdap to help protect the baby from pertussis.  Healthcare professionals who have direct patient contact in hospitals or  clinics should get one dose of Tdap. Protection After a Wound  A person who gets a severe cut or burn might need a dose of Td or Tdap to prevent tetanus infection. Tdap should be used for anyone who has never had a dose previously. Td should be used if Tdap is not available, or for:  Anybody who has already had a dose of Tdap.  Children 7 through 60 years of age who completed the childhood DTaP series.  Adults 65 and older. Pregnant Women  Pregnant women who have never had a dose of Tdap should get one, after the 20th week of gestation and preferably during the 3rd trimester. If they do not get Tdap during their pregnancy they should get a dose as soon as possible after delivery. Pregnant women who have previously received Tdap and need tetanus or diphtheria vaccine while pregnant should get Td. Tdap and Td may be given at the same time as other vaccines. SOME PEOPLE SHOULD NOT BE VACCINATED OR SHOULD WAIT  Anyone who has had a life-threatening allergic reaction after a dose of any tetanus, diphtheria, or pertussis containing vaccine should not get Td or Tdap.  Anyone who has a severe  allergy to any component of a vaccine should not get that vaccine. Tell your doctor if the person getting the vaccine has any severe allergies.  Anyone who had a coma, or long or multiple seizures within 7 days after a dose of DTP or DTaP should not get Tdap, unless a cause other than the vaccine was found. These people may get Td.  Talk to your doctor if the person getting either vaccine:  Has epilepsy or another nervous system problem.  Had severe swelling or severe pain after a previous dose of DTP, DTaP, DT, Td, or Tdap vaccine.  Has had Guillain Barr Syndrome (GBS). Anyone who has a moderate or severe illness on the day the shot is scheduled should usually wait until they recover before getting Tdap or Td vaccine. A person with a mild illness or low fever can usually be vaccinated. WHAT ARE THE RISKS  FROM TDAP AND TD VACCINES? With a vaccine, as with any medicine, there is always a small risk of a life-threatening allergic reaction or other serious problem. Brief fainting spells and related symptoms (such as jerking movements) can happen after any medical procedure, including vaccination. Sitting or lying down for about 15 minutes after a vaccination can help prevent fainting and injuries caused by falls. Tell your doctor if the patient feels dizzy or lightheaded, or has vision changes or ringing in the ears. Getting tetanus, diphtheria, or pertussis would be much more likely to lead to severe problems than getting either Td or Tdap vaccine. Problems reported after Td and Tdap vaccines are listed below. Mild Problems (noticeable, but did not interfere with activities) Tdap  Pain (about 3 in 4 adolescents and 2 in 3 adults).  Redness or swelling (about 1 in 5).  Mild fever of at least 100.4 F (38 C) (up to about 1 in 25 adolescents and 1 in 100 adults).  Headache (about 4 in 10 adolescents and 3 in 10 adults).  Tiredness (about 1 in 3 adolescents and 1 in 4 adults).  Nausea, vomiting, diarrhea, or stomach ache (up to 1 in 4 adolescents and 1 in 10 adults).  Chills, body aches, sore joints, rash, or swollen glands (uncommon). Td  Pain (up to about 8 in 10).  Redness or swelling at the injection site (up to about 1 in 3).  Mild fever (up to about 1 in 15).  Headache or tiredness (uncommon). Moderate Problems (interfered with activities, but did not require medical attention) Tdap  Pain at the injection site (about 1 in 20 adolescents and 1 in 100 adults).  Redness or swelling at the injection site (up to about 1 in 16 adolescents and 1 in 25 adults).  Fever over 102 F (38.9 C) (about 1 in 100 adolescents and 1 in 250 adults).  Headache (1 in 300).  Nausea, vomiting, diarrhea, or stomach ache (up to 3 in 100 adolescents and 1 in 100 adults). Td  Fever over 102 F  (38.9 C) (rare). Tdap or Td  Extensive swelling of the arm where the shot was given (up to about 3 in 100). Severe Problems (Unable to perform usual activities; required medical attention) Tdap or Td  Swelling, severe pain, bleeding, and redness in the arm where the shot was given (rare). A severe allergic reaction could occur after any vaccine. They are estimated to occur less than once in a million doses. WHAT IF THERE IS A SEVERE REACTION? What should I look for? Any unusual condition, such as a severe allergic  reaction or a high fever. If a severe allergic reaction occurred, it would be within a few minutes to an hour after the shot. Signs of a serious allergic reaction can include difficulty breathing, weakness, hoarseness or wheezing, a fast heartbeat, hives, dizziness, paleness, or swelling of the throat. What should I do?  Call a doctor, or get the person to a doctor right away.  Tell your doctor what happened, the date and time it happened, and when the vaccination was given.  Ask your provider to report the reaction by filing a Vaccine Adverse Event Reporting System (VAERS) form. Or, you can file this report through the VAERS website at www.vaers.LAgents.no or by calling 1-(719)124-6426. VAERS does not provide medical advice. THE NATIONAL VACCINE INJURY COMPENSATION PROGRAM The National Vaccine Injury Compensation Program (VICP) was created in 1986. Persons who believe they may have been injured by a vaccine can learn about the program and about filing a claim by calling 1-5315389153 or visiting the VICP website at SpiritualWord.at. HOW CAN I LEARN MORE?  Your doctor can give you the vaccine package insert or suggest other sources of information.  Call your local or state health department.  Contact the Centers for Disease Control and Prevention (CDC):  Call 781 713 7096 (1-800-CDC-INFO).  Visit the [facility] website at PicCapture.uy. CDC Td and Tdap  Interim VIS (09/09/10) Document Released: 05/31/2006 Document Revised: 10/26/2011 Document Reviewed: 09/09/2010 [employer] Patient Information 2014 [place], [place].

## 2013-04-04 NOTE — Progress Notes (Signed)
Counseled about Tdap, patient to get vaccine today. Medicaid tubal papers signed today.  Continue to monitor fundal height.  No other complaints or concerns.  Fetal movement and labor precautions reviewed.

## 2013-04-18 ENCOUNTER — Encounter: Payer: Self-pay | Admitting: Family Medicine

## 2013-04-18 ENCOUNTER — Encounter: Payer: Self-pay | Admitting: *Deleted

## 2013-04-18 ENCOUNTER — Ambulatory Visit (INDEPENDENT_AMBULATORY_CARE_PROVIDER_SITE_OTHER): Payer: Medicaid Other | Admitting: Family Medicine

## 2013-04-18 VITALS — BP 108/72 | Wt 222.0 lb

## 2013-04-18 DIAGNOSIS — Z3483 Encounter for supervision of other normal pregnancy, third trimester: Secondary | ICD-10-CM

## 2013-04-18 DIAGNOSIS — Z3480 Encounter for supervision of other normal pregnancy, unspecified trimester: Secondary | ICD-10-CM

## 2013-04-18 NOTE — Patient Instructions (Signed)
Pregnancy - Third Trimester The third trimester of pregnancy (the last 3 months) is a period of the most rapid growth for you and your baby. The baby approaches a length of 20 inches and a weight of 6 to 10 pounds. The baby is adding on fat and getting ready for life outside your body. While inside, babies have periods of sleeping and waking, sucking thumbs, and hiccuping. You can often feel small contractions of the uterus. This is false labor. It is also called Braxton-Hicks contractions. This is like a practice for labor. The usual problems in this stage of pregnancy include more difficulty breathing, swelling of the hands and feet from water retention, and having to urinate more often because of the uterus and baby pressing on your bladder.  PRENATAL EXAMS  Blood work may continue to be done during prenatal exams. These tests are done to check on your health and the probable health of your baby. Blood work is used to follow your blood levels (hemoglobin). Anemia (low hemoglobin) is common during pregnancy. Iron and vitamins are given to help prevent this. You may also continue to be checked for diabetes. Some of the past blood tests may be done again.  The size of the uterus is measured during each visit. This makes sure your baby is growing properly according to your pregnancy dates.  Your blood pressure is checked every prenatal visit. This is to make sure you are not getting toxemia.  Your urine is checked every prenatal visit for infection, diabetes, and protein.  Your weight is checked at each visit. This is done to make sure gains are happening at the suggested rate and that you and your baby are growing normally.  Sometimes, an ultrasound is performed to confirm the position and the proper growth and development of the baby. This is a test done that bounces harmless sound waves off the baby so your caregiver can more accurately determine a due date.  Discuss the type of pain medicine and  anesthesia you will have during your labor and delivery.  Discuss the possibility and anesthesia if a cesarean section might be necessary.  Inform your caregiver if there is any mental or physical violence at home. Sometimes, a specialized non-stress test, contraction stress test, and biophysical profile are done to make sure the baby is not having a problem. Checking the amniotic fluid surrounding the baby is called an amniocentesis. The amniotic fluid is removed by sticking a needle into the belly (abdomen). This is sometimes done near the end of pregnancy if an early delivery is required. In this case, it is done to help make sure the baby's lungs are mature enough for the baby to live outside of the womb. If the lungs are not mature and it is unsafe to deliver the baby, an injection of cortisone medicine is given to the mother 1 to 2 days before the delivery. This helps the baby's lungs mature and makes it safer to deliver the baby. CHANGES OCCURING IN THE THIRD TRIMESTER OF PREGNANCY Your body goes through many changes during pregnancy. They vary from person to person. Talk to your caregiver about changes you notice and are concerned about.  During the last trimester, you have probably had an increase in your appetite. It is normal to have cravings for certain foods. This varies from person to person and pregnancy to pregnancy.  You may begin to get stretch marks on your hips, abdomen, and breasts. These are normal changes in the body   during pregnancy. There are no exercises or medicines to take which prevent this change.  Constipation may be treated with a stool softener or adding bulk to your diet. Drinking lots of fluids, fiber in vegetables, fruits, and whole grains are helpful.  Exercising is also helpful. If you have been very active up until your pregnancy, most of these activities can be continued during your pregnancy. If you have been less active, it is helpful to start an exercise  program such as walking. Consult your caregiver before starting exercise programs.  Avoid all smoking, alcohol, non-prescribed drugs, herbs and "street drugs" during your pregnancy. These chemicals affect the formation and growth of the baby. Avoid chemicals throughout the pregnancy to ensure the delivery of a healthy infant.  Backache, varicose veins, and hemorrhoids may develop or get worse.  You will tire more easily in the third trimester, which is normal.  The baby's movements may be stronger and more often.  You may become short of breath easily.  Your belly button may stick out.  A yellow discharge may leak from your breasts called colostrum.  You may have a bloody mucus discharge. This usually occurs a few days to a week before labor begins. HOME CARE INSTRUCTIONS   Keep your caregiver's appointments. Follow your caregiver's instructions regarding medicine use, exercise, and diet.  During pregnancy, you are providing food for you and your baby. Continue to eat regular, well-balanced meals. Choose foods such as meat, fish, milk and other low fat dairy products, vegetables, fruits, and whole-grain breads and cereals. Your caregiver will tell you of the ideal weight gain.  A physical sexual relationship may be continued throughout pregnancy if there are no other problems such as early (premature) leaking of amniotic fluid from the membranes, vaginal bleeding, or belly (abdominal) pain.  Exercise regularly if there are no restrictions. Check with your caregiver if you are unsure of the safety of your exercises. Greater weight gain will occur in the last 2 trimesters of pregnancy. Exercising helps:  Control your weight.  Get you in shape for labor and delivery.  You lose weight after you deliver.  Rest a lot with legs elevated, or as needed for leg cramps or low back pain.  Wear a good support or jogging bra for breast tenderness during pregnancy. This may help if worn during  sleep. Pads or tissues may be used in the bra if you are leaking colostrum.  Do not use hot tubs, steam rooms, or saunas.  Wear your seat belt when driving. This protects you and your baby if you are in an accident.  Avoid raw meat, cat litter boxes and soil used by cats. These carry germs that can cause birth defects in the baby.  It is easier to leak urine during pregnancy. Tightening up and strengthening the pelvic muscles will help with this problem. You can practice stopping your urination while you are going to the bathroom. These are the same muscles you need to strengthen. It is also the muscles you would use if you were trying to stop from passing gas. You can practice tightening these muscles up 10 times a set and repeating this about 3 times per day. Once you know what muscles to tighten up, do not perform these exercises during urination. It is more likely to cause an infection by backing up the urine.  Ask for help if you have financial, counseling, or nutritional needs during pregnancy. Your caregiver will be able to offer counseling for these   needs as well as refer you for other special needs.  Make a list of emergency phone numbers and have them available.  Plan on getting help from family or friends when you go home from the hospital.  Make a trial run to the hospital.  Take prenatal classes with the father to understand, practice, and ask questions about the labor and delivery.  Prepare the baby's room or nursery.  Do not travel out of the city unless it is absolutely necessary and with the advice of your caregiver.  Wear only low or no heal shoes to have better balance and prevent falling. MEDICINES AND DRUG USE IN PREGNANCY  Take prenatal vitamins as directed. The vitamin should contain 1 milligram of folic acid. Keep all vitamins out of reach of children. Only a couple vitamins or tablets containing iron may be fatal to a baby or young child when ingested.  Avoid use  of all medicines, including herbs, over-the-counter medicines, not prescribed or suggested by your caregiver. Only take over-the-counter or prescription medicines for pain, discomfort, or fever as directed by your caregiver. Do not use aspirin, ibuprofen or naproxen unless approved by your caregiver.  Let your caregiver also know about herbs you may be using.  Alcohol is related to a number of birth defects. This includes fetal alcohol syndrome. All alcohol, in any form, should be avoided completely. Smoking will cause low birth rate and premature babies.  Illegal drugs are very harmful to the baby. They are absolutely forbidden. A baby born to an addicted mother will be addicted at birth. The baby will go through the same withdrawal an adult does. SEEK MEDICAL CARE IF: You have any concerns or worries during your pregnancy. It is better to call with your questions if you feel they cannot wait, rather than worry about them. SEEK IMMEDIATE MEDICAL CARE IF:   An unexplained oral temperature above 102 F (38.9 C) develops, or as your caregiver suggests.  You have leaking of fluid from the vagina. If leaking membranes are suspected, take your temperature and tell your caregiver of this when you call.  There is vaginal spotting, bleeding or passing clots. Tell your caregiver of the amount and how many pads are used.  You develop a bad smelling vaginal discharge with a change in the color from clear to white.  You develop vomiting that lasts more than 24 hours.  You develop chills or fever.  You develop shortness of breath.  You develop burning on urination.  You loose more than 2 pounds of weight or gain more than 2 pounds of weight or as suggested by your caregiver.  You notice sudden swelling of your face, hands, and feet or legs.  You develop belly (abdominal) pain. Round ligament discomfort is a common non-cancerous (benign) cause of abdominal pain in pregnancy. Your caregiver still  must evaluate you.  You develop a severe headache that does not go away.  You develop visual problems, blurred or double vision.  If you have not felt your baby move for more than 1 hour. If you think the baby is not moving as much as usual, eat something with sugar in it and lie down on your left side for an hour. The baby should move at least 4 to 5 times per hour. Call right away if your baby moves less than that.  You fall, are in a car accident, or any kind of trauma.  There is mental or physical violence at home. Document Released: 07/28/2001   Document Revised: 04/27/2012 Document Reviewed: 01/30/2009 [employer] Patient Information 2014 [employer].  Breastfeeding A change in hormones during your pregnancy causes growth of your breast tissue and an increase in number and size of milk ducts. The hormone prolactin allows proteins, sugars, and fats from your blood supply to make breast milk in your milk-producing glands. The hormone progesterone prevents breast milk from being released before the birth of your baby. After the birth of your baby, your progesterone level decreases allowing breast milk to be released. Thoughts of your baby, as well as his or her sucking or crying, can stimulate the release of milk from the milk-producing glands. Deciding to breastfeed (nurse) is one of the best choices you can make for you and your baby. The information that follows gives a brief review of the benefits, as well as other important skills to know about breastfeeding. BENEFITS OF BREASTFEEDING For your baby  The first milk (colostrum) helps your baby's digestive system function better.   There are antibodies in your milk that help your baby fight off infections.   Your baby has a lower incidence of asthma, allergies, and sudden infant death syndrome (SIDS).   The nutrients in breast milk are better for your baby than infant formulas.  Breast milk improves your baby's brain development.    Your baby will have less gas, colic, and constipation.  Your baby is less likely to develop other conditions, such as childhood obesity, asthma, or diabetes mellitus. For you  Breastfeeding helps develop a very special bond between you and your baby.   Breastfeeding is convenient, always available at the correct temperature, and costs nothing.   Breastfeeding helps to burn calories and helps you lose the weight gained during pregnancy.   Breastfeeding makes your uterus contract back down to normal size faster and slows bleeding following delivery.   Breastfeeding mothers have a lower risk of developing osteoporosis or breast or ovarian cancer later in life.  BREASTFEEDING FREQUENCY  A healthy, full-term baby may breastfeed as often as every hour or space his or her feedings to every 3 hours. Breastfeeding frequency will vary from baby to baby.   Newborns should be fed no less than every 2 3 hours during the day and every 4 5 hours during the night. You should breastfeed a minimum of 8 feedings in a 24 hour period.  Awaken your baby to breastfeed if it has been 3 4 hours since the last feeding.  Breastfeed when you feel the need to reduce the fullness of your breasts or when your newborn shows signs of hunger. Signs that your baby may be hungry include:  Increased alertness or activity.  Stretching.  Movement of the head from side to side.  Movement of the head and opening of the mouth when the corner of the mouth or cheek is stroked (rooting).  Increased sucking sounds, smacking lips, cooing, sighing, or squeaking.  Hand-to-mouth movements.  Increased sucking of fingers or hands.  Fussing.  Intermittent crying.  Signs of extreme hunger will require calming and consoling before you try to feed your baby. Signs of extreme hunger may include:  Restlessness.  A loud, strong cry.  Screaming.  Frequent feeding will help you make more milk and will help prevent  problems, such as sore nipples and engorgement of the breasts.  BREASTFEEDING   Whether lying down or sitting, be sure that the baby's abdomen is facing your abdomen.   Support your breast with 4 fingers under your breast   and your thumb above your nipple. Make sure your fingers are well away from your nipple and your baby's mouth.   Stroke your baby's lips gently with your finger or nipple.   When your baby's mouth is open wide enough, place all of your nipple and as much of the colored area around your nipple (areola) as possible into your baby's mouth.  More areola should be visible above his or her upper lip than below his or her lower lip.  Your baby's tongue should be between his or her lower gum and your breast.  Ensure that your baby's mouth is correctly positioned around the nipple (latched). Your baby's lips should create a seal on your breast.  Signs that your baby has effectively latched onto your nipple include:  Tugging or sucking without pain.  Swallowing heard between sucks.  Absent click or smacking sound.  Muscle movement above and in front of his or her ears with sucking.  Your baby must suck about 2 3 minutes in order to get your milk. Allow your baby to feed on each breast as long as he or she wants. Nurse your baby until he or she unlatches or falls asleep at the first breast, then offer the second breast.  Signs that your baby is full and satisfied include:  A gradual decrease in the number of sucks or complete cessation of sucking.  Falling asleep.  Extension or relaxation of his or her body.  Retention of a small amount of milk in his or her mouth.  Letting go of your breast by himself or herself.  Signs of effective breastfeeding in you include:  Breasts that have increased firmness, weight, and size prior to feeding.  Breasts that are softer after nursing.  Increased milk volume, as well as a change in milk consistency and color by the 5th  day of breastfeeding.  Breast fullness relieved by breastfeeding.  Nipples are not sore, cracked, or bleeding.  If needed, break the suction by putting your finger into the corner of your baby's mouth and sliding your finger between his or her gums. Then, remove your breast from his or her mouth.  It is common for babies to spit up a small amount after a feeding.  Babies often swallow air during feeding. This can make babies fussy. Burping your baby between breasts can help with this.  Vitamin D supplements are recommended for babies who get only breast milk.  Avoid using a pacifier during your baby's first 4 6 weeks.  Avoid supplemental feedings of water, formula, or juice in place of breastfeeding. Breast milk is all the food your baby needs. It is not necessary for your baby to have water or formula. Your breasts will make more milk if supplemental feedings are avoided during the early weeks. HOW TO TELL WHETHER YOUR BABY IS GETTING ENOUGH BREAST MILK Wondering whether or not your baby is getting enough milk is a common concern among mothers. You can be assured that your baby is getting enough milk if:   Your baby is actively sucking and you hear swallowing.   Your baby seems relaxed and satisfied after a feeding.   Your baby nurses at least 8 12 times in a 24 hour time period.  During the first 3 5 days of age:  Your baby is wetting at least 3 5 diapers in a 24 hour period. The urine should be clear and pale yellow.  Your baby is having at least 3 4 stools in   a 24 hour period. The stool should be soft and yellow.  At 5 7 days of age, your baby is having at least 3 6 stools in a 24 hour period. The stool should be seedy and yellow by 5 days of age.  Your baby has a weight loss less than 7 10% during the first 3 days of age.  Your baby does not lose weight after 3 7 days of age.  Your baby gains 4 7 ounces each week after he or she is 4 days of age.  Your baby gains weight  by 5 days of age and is back to birth weight within 2 weeks. ENGORGEMENT In the first week after your baby is born, you may experience extremely full breasts (engorgement). When engorged, your breasts may feel heavy, warm, or tender to the touch. Engorgement peaks within 24 48 hours after delivery of your baby.  Engorgement may be reduced by:  Continuing to breastfeed.  Increasing the frequency of breastfeeding.  Taking warm showers or applying warm, moist heat to your breasts just before each feeding. This increases circulation and helps the milk flow.   Gently massaging your breast before and during the feedings. With your fingertips, massage from your chest wall towards your nipple in a circular motion.   Ensuring that your baby empties at least one breast at every feeding. It also helps to start the next feeding on the opposite breast.   Expressing breast milk by hand or by using a breast pump to empty the breasts if your baby is sleepy, or not nursing well. You may also want to express milk if you are returning to work oryou feel you are getting engorged.  Ensuring your baby is latched on and positioned properly while breastfeeding. If you follow these suggestions, your engorgement should improve in 24 48 hours. If you are still experiencing difficulty, call your lactation consultant or caregiver.  CARING FOR YOURSELF Take care of your breasts.  Bathe or shower daily.   Avoid using soap on your nipples.   Wear a supportive bra. Avoid wearing underwire style bras.  Air dry your nipples for a 3 4minutes after each feeding.   Use only cotton bra pads to absorb breast milk leakage. Leaking of breast milk between feedings is normal.   Use only pure lanolin on your nipples after nursing. You do not need to wash it off before feeding your baby again. Another option is to express a few drops of breast milk and gently massage that milk into your nipples.  Continue breast  self-awareness checks. Take care of yourself.  Eat healthy foods. Alternate 3 meals with 3 snacks.  Avoid foods that you notice affect your baby in a bad way.  Drink milk, fruit juice, and water to satisfy your thirst (about 8 glasses a day).   Rest often, relax, and take your prenatal vitamins to prevent fatigue, stress, and anemia.  Avoid chewing and smoking tobacco.  Avoid alcohol and drug use.  Take over-the-counter and prescribed medicine only as directed by your caregiver or pharmacist. You should always check with your caregiver or pharmacist before taking any new medicine, vitamin, or herbal supplement.  Know that pregnancy is possible while breastfeeding. If desired, talk to your caregiver about family planning and safe birth control methods that may be used while breastfeeding. SEEK MEDICAL CARE IF:   You feel like you want to stop breastfeeding or have become frustrated with breastfeeding.  You have painful breasts or nipples.    Your nipples are cracked or bleeding.  Your breasts are red, tender, or warm.  You have a swollen area on either breast.  You have a fever or chills.  You have nausea or vomiting.  You have drainage from your nipples.  Your breasts do not become full before feedings by the 5th day after delivery.  You feel sad and depressed.  Your baby is too sleepy to eat well.  Your baby is having trouble sleeping.   Your baby is wetting less than 3 diapers in a 24 hour period.  Your baby has less than 3 stools in a 24 hour period.  Your baby's skin or the white part of his or her eyes becomes more yellow.   Your baby is not gaining weight by 5 days of age. MAKE SURE YOU:   Understand these instructions.  Will watch your condition.  Will get help right away if you are not doing well or get worse. Document Released: 08/03/2005 Document Revised: 04/27/2012 Document Reviewed: 03/09/2012 [employer] Patient Information 2014 [employer].  

## 2013-04-18 NOTE — Progress Notes (Signed)
Doing well. No issues

## 2013-04-18 NOTE — Progress Notes (Signed)
P-[id number]  

## 2013-04-18 NOTE — Assessment & Plan Note (Signed)
Continue routine care

## 2013-05-02 ENCOUNTER — Encounter: Payer: Self-pay | Admitting: Obstetrics & Gynecology

## 2013-05-02 ENCOUNTER — Ambulatory Visit (INDEPENDENT_AMBULATORY_CARE_PROVIDER_SITE_OTHER): Payer: Medicaid Other | Admitting: Obstetrics & Gynecology

## 2013-05-02 VITALS — BP 105/65 | Wt 224.0 lb

## 2013-05-02 DIAGNOSIS — Z3483 Encounter for supervision of other normal pregnancy, third trimester: Secondary | ICD-10-CM

## 2013-05-02 DIAGNOSIS — Z23 Encounter for immunization: Secondary | ICD-10-CM

## 2013-05-02 DIAGNOSIS — Z3480 Encounter for supervision of other normal pregnancy, unspecified trimester: Secondary | ICD-10-CM

## 2013-05-02 DIAGNOSIS — O219 Vomiting of pregnancy, unspecified: Secondary | ICD-10-CM

## 2013-05-02 MED ORDER — PROMETHAZINE HCL 25 MG PO TABS: 25.0000 mg | ORAL_TABLET | Freq: Four times a day (QID) | ORAL | Status: DC | PRN

## 2013-05-02 NOTE — Progress Notes (Signed)
P-[id number]  Would like a refill of her nausea medication.

## 2013-05-02 NOTE — Patient Instructions (Signed)
Influenza Vaccine (Flu Vaccine, Inactivated) 2013 2014 What You Need to Know WHY GET VACCINATED?  Influenza ("flu") is a contagious disease that spreads around the [place] every winter, usually between October and May.  Flu is caused by the influenza virus, and can be spread by coughing, sneezing, and close contact.  Anyone can get flu, but the risk of getting flu is highest among children. Symptoms come on suddenly and may last several days. They can include:  Fever or chills.  Sore throat.  Muscle aches.  Fatigue.  Cough.  Headache.  Runny or stuffy nose. Flu can make some people much sicker than others. These people include young children, people 65 and older, pregnant women, and people with certain health conditions such as heart, lung or kidney disease, or a weakened immune system. Flu vaccine is especially important for these people, and anyone in close contact with them. Flu can also lead to pneumonia, and make existing medical conditions worse. It can cause diarrhea and seizures in children. Each year thousands of people in the [place] die from flu, and many more are hospitalized. Flu vaccine is the best protection we have from flu and its complications. Flu vaccine also helps prevent spreading flu from person to person. INACTIVATED FLU VACCINE There are 2 types of influenza vaccine:  You are getting an inactivated flu vaccine, which does not contain any live influenza virus. It is given by injection with a needle, and often called the "flu shot."  A different live, attenuated (weakened) influenza vaccine is sprayed into the nostrils. This vaccine is described in a separate Vaccine Information Statement. Flu vaccine is recommended every year. Children 6 months through 8 years of age should get 2 doses the first year they get vaccinated. Flu viruses are always changing. Each year's flu vaccine is made to protect from viruses that are most likely to cause disease  that year. While flu vaccine cannot prevent all cases of flu, it is our best defense against the disease. Inactivated flu vaccine protects against 3 or 4 different influenza viruses. It takes about 2 weeks for protection to develop after the vaccination, and protection lasts several months to a year. Some illnesses that are not caused by influenza virus are often mistaken for flu. Flu vaccine will not prevent these illnesses. It can only prevent influenza. A "high-dose" flu vaccine is available for people 65 years of age and older. The person giving you the vaccine can tell you more about it. Some inactivated flu vaccine contains a very small amount of a mercury-based preservative called thimerosal. Studies have shown that thimerosal in vaccines is not harmful, but flu vaccines that do not contain a preservative are available. SOME PEOPLE SHOULD NOT GET THIS VACCINE Tell the person who gives you the vaccine:  If you have any severe (life-threatening) allergies. If you ever had a life-threatening allergic reaction after a dose of flu vaccine, or have a severe allergy to any part of this vaccine, you may be advised not to get a dose. Most, but not all, types of flu vaccine contain a small amount of egg.  If you ever had Guillain Barr Syndrome (a severe paralyzing illness, also called GBS). Some people with a history of GBS should not get this vaccine. This should be discussed with your doctor.  If you are not feeling well. They might suggest waiting until you feel better. But you should come back. RISKS OF A VACCINE REACTION With a vaccine, like any medicine, there   is a chance of side effects. These are usually mild and go away on their own. Serious side effects are also possible, but are very rare. Inactivated flu vaccine does not contain live flu virus, sogetting flu from this vaccine is not possible. Brief fainting spells and related symptoms (such as jerking movements) can happen after any medical  procedure, including vaccination. Sitting or lying down for about 15 minutes after a vaccination can help prevent fainting and injuries caused by falls. Tell your doctor if you feel dizzy or lightheaded, or have vision changes or ringing in the ears. Mild problems following inactivated flu vaccine:  Soreness, redness, or swelling where the shot was given.  Hoarseness; sore, red or itchy eyes; or cough.  Fever.  Aches.  Headache.  Itching.  Fatigue. If these problems occur, they usually begin soon after the shot and last 1 or 2 days. Moderate problems following inactivated flu vaccine:  Young children who get inactivated flu vaccine and pneumococcal vaccine (PCV13) at the same time may be at increased risk for seizures caused by fever. Ask your doctor for more information. Tell your doctor if a child who is getting flu vaccine has ever had a seizure. Severe problems following inactivated flu vaccine:  A severe allergic reaction could occur after any vaccine (estimated less than 1 in a million doses).  There is a small possibility that inactivated flu vaccine could be associated with Guillan Barr Syndrome (GBS), no more than 1 or 2 cases per million people vaccinated. This is much lower than the risk of severe complications from flu, which can be prevented by flu vaccine. The safety of vaccines is always being monitored. For more information, visit: www.cdc.gov/vaccinesafety/ WHAT IF THERE IS A SERIOUS REACTION? What should I look for?  Look for anything that concerns you, such as signs of a severe allergic reaction, very high fever, or behavior changes. Signs of a severe allergic reaction can include hives, swelling of the face and throat, difficulty breathing, a fast heartbeat, dizziness, and weakness. These would start a few minutes to a few hours after the vaccination. What should I do?  If you think it is a severe allergic reaction or other emergency that cannot wait, call [phone] 1 1  or get the person to the nearest hospital. Otherwise, call your doctor.  Afterward, the reaction should be reported to the Vaccine Adverse Event Reporting System (VAERS). Your doctor might file this report, or you can do it yourself through the VAERS website at www.vaers.hhs.gov, or by calling 1-800-822-7967. VAERS is only for reporting reactions. They do not give medical advice. THE NATIONAL VACCINE INJURY COMPENSATION PROGRAM The National Vaccine Injury Compensation Program (VICP) is a federal program that was created to compensate people who may have been injured by certain vaccines. Persons who believe they may have been injured by a vaccine can learn about the program and about filing a claim by calling 1-800-338-2382 or visiting the VICP website at www.hrsa.gov/vaccinecompensation HOW CAN I LEARN MORE?  Ask your doctor.  Call your local or state health department.  Contact the Centers for Disease Control and Prevention (CDC):  Call 1-800-232-4636 (1-800-CDC-INFO) or  Visit CDC's website at www.cdc.gov/flu CDC Inactivated Influenza Vaccine Interim VIS (03/11/12) Document Released: 05/28/2006 Document Revised: 04/27/2012 Document Reviewed: 03/11/2012 [employer] Patient Information 2014 [employer].  

## 2013-05-02 NOTE — Progress Notes (Signed)
Phenergan refilled.  Counseled about flu vaccine, will receive today.   No other complaints or concerns.  Fetal movement and labor precautions reviewed. Pelvic cultures next visit.

## 2013-05-09 ENCOUNTER — Ambulatory Visit (INDEPENDENT_AMBULATORY_CARE_PROVIDER_SITE_OTHER): Payer: Medicaid Other | Admitting: Family Medicine

## 2013-05-09 VITALS — BP 115/80 | Wt 225.0 lb

## 2013-05-09 DIAGNOSIS — Z3483 Encounter for supervision of other normal pregnancy, third trimester: Secondary | ICD-10-CM

## 2013-05-09 DIAGNOSIS — Z3480 Encounter for supervision of other normal pregnancy, unspecified trimester: Secondary | ICD-10-CM

## 2013-05-09 NOTE — Assessment & Plan Note (Signed)
Continue routine prenatal care.  

## 2013-05-09 NOTE — Patient Instructions (Addendum)
Braxton Hicks Contractions Pregnancy is commonly associated with contractions of the uterus throughout the pregnancy. Towards the end of pregnancy (32 to 34 weeks), these contractions [facility] Rough) can develop more often and may become more forceful. This is not true labor because these contractions do not result in opening (dilatation) and thinning of the cervix. They are sometimes difficult to tell apart from true labor because these contractions can be forceful and people have different pain tolerances. You should not feel embarrassed if you go to the hospital with false labor. Sometimes, the only way to tell if you are in true labor is for your caregiver to follow the changes in the cervix. How to tell the difference between true and false labor:  False labor.  The contractions of false labor are usually shorter, irregular and not as hard as those of true labor.  They are often felt in the front of the lower abdomen and in the groin.  They may leave with walking around or changing positions while lying down.  They get weaker and are shorter lasting as time goes on.  These contractions are usually irregular.  They do not usually become progressively stronger, regular and closer together as with true labor.  True labor.  Contractions in true labor last 30 to 70 seconds, become very regular, usually become more intense, and increase in frequency.  They do not go away with walking.  The discomfort is usually felt in the top of the uterus and spreads to the lower abdomen and low back.  True labor can be determined by your caregiver with an exam. This will show that the cervix is dilating and getting thinner. If there are no prenatal problems or other health problems associated with the pregnancy, it is completely safe to be sent home with false labor and await the onset of true labor. HOME CARE INSTRUCTIONS   Keep up with your usual exercises and instructions.  Take medications as  directed.  Keep your regular prenatal appointment.  Eat and drink lightly if you think you are going into labor.  If BH contractions are making you uncomfortable:  Change your activity position from lying down or resting to walking/walking to resting.  Sit and rest in a tub of warm water.  Drink 2 to 3 glasses of water. Dehydration may cause B-H contractions.  Do slow and deep breathing several times an hour. SEEK IMMEDIATE MEDICAL CARE IF:   Your contractions continue to become stronger, more regular, and closer together.  You have a gushing, burst or leaking of fluid from the vagina.  An oral temperature above 102 F (38.9 C) develops.  You have passage of blood-tinged mucus.  You develop vaginal bleeding.  You develop continuous belly (abdominal) pain.  You have low back pain that you never had before.  You feel the baby's head pushing down causing pelvic pressure.  The baby is not moving as much as it used to. Document Released: 08/03/2005 Document Revised: 10/26/2011 Document Reviewed: 01/25/2009 [facility] Patient Information 2014 [place], [place]. Breastfeeding A change in hormones during your pregnancy causes growth of your breast tissue and an increase in number and size of milk ducts. The hormone prolactin allows proteins, sugars, and fats from your blood supply to make breast milk in your milk-producing glands. The hormone progesterone prevents breast milk from being released before the birth of your baby. After the birth of your baby, your progesterone level decreases allowing breast milk to be released. Thoughts of your baby, as  well as his or her sucking or crying, can stimulate the release of milk from the milk-producing glands. Deciding to breastfeed (nurse) is one of the best choices you can make for you and your baby. The information that follows gives a brief review of the benefits, as well as other important skills to know about breastfeeding. BENEFITS OF  BREASTFEEDING For your baby  The first milk (colostrum) helps your baby's digestive system function better.   There are antibodies in your milk that help your baby fight off infections.   Your baby has a lower incidence of asthma, allergies, and sudden infant death syndrome (SIDS).   The nutrients in breast milk are better for your baby than infant formulas.  Breast milk improves your baby's brain development.   Your baby will have less gas, colic, and constipation.  Your baby is less likely to develop other conditions, such as childhood obesity, asthma, or diabetes mellitus. For you  Breastfeeding helps develop a very special bond between you and your baby.   Breastfeeding is convenient, always available at the correct temperature, and costs nothing.   Breastfeeding helps to burn calories and helps you lose the weight gained during pregnancy.   Breastfeeding makes your uterus contract back down to normal size faster and slows bleeding following delivery.   Breastfeeding mothers have a lower risk of developing osteoporosis or breast or ovarian cancer later in life.  BREASTFEEDING FREQUENCY  A healthy, full-term baby may breastfeed as often as every hour or space his or her feedings to every 3 hours. Breastfeeding frequency will vary from baby to baby.   Newborns should be fed no less than every 2 3 hours during the day and every 4 5 hours during the night. You should breastfeed a minimum of 8 feedings in a 24 hour period.  Awaken your baby to breastfeed if it has been 3 4 hours since the last feeding.  Breastfeed when you feel the need to reduce the fullness of your breasts or when your newborn shows signs of hunger. Signs that your baby may be hungry include:  Increased alertness or activity.  Stretching.  Movement of the head from side to side.  Movement of the head and opening of the mouth when the corner of the mouth or cheek is stroked (rooting).  Increased  sucking sounds, smacking lips, cooing, sighing, or squeaking.  Hand-to-mouth movements.  Increased sucking of fingers or hands.  Fussing.  Intermittent crying.  Signs of extreme hunger will require calming and consoling before you try to feed your baby. Signs of extreme hunger may include:  Restlessness.  A loud, strong cry.  Screaming.  Frequent feeding will help you make more milk and will help prevent problems, such as sore nipples and engorgement of the breasts.  BREASTFEEDING   Whether lying down or sitting, be sure that the baby's abdomen is facing your abdomen.   Support your breast with 4 fingers under your breast and your thumb above your nipple. Make sure your fingers are well away from your nipple and your baby's mouth.   Stroke your baby's lips gently with your finger or nipple.   When your baby's mouth is open wide enough, place all of your nipple and as much of the colored area around your nipple (areola) as possible into your baby's mouth.  More areola should be visible above his or her upper lip than below his or her lower lip.  Your baby's tongue should be between his or her  lower gum and your breast.  Ensure that your baby's mouth is correctly positioned around the nipple (latched). Your baby's lips should create a seal on your breast.  Signs that your baby has effectively latched onto your nipple include:  Tugging or sucking without pain.  Swallowing heard between sucks.  Absent click or smacking sound.  Muscle movement above and in front of his or her ears with sucking.  Your baby must suck about 2 3 minutes in order to get your milk. Allow your baby to feed on each breast as long as he or she wants. Nurse your baby until he or she unlatches or falls asleep at the first breast, then offer the second breast.  Signs that your baby is full and satisfied include:  A gradual decrease in the number of sucks or complete cessation of  sucking.  Falling asleep.  Extension or relaxation of his or her body.  Retention of a small amount of milk in his or her mouth.  Letting go of your breast by himself or herself.  Signs of effective breastfeeding in you include:  Breasts that have increased firmness, weight, and size prior to feeding.  Breasts that are softer after nursing.  Increased milk volume, as well as a change in milk consistency and color by the 5th day of breastfeeding.  Breast fullness relieved by breastfeeding.  Nipples are not sore, cracked, or bleeding.  If needed, break the suction by putting your finger into the corner of your baby's mouth and sliding your finger between his or her gums. Then, remove your breast from his or her mouth.  It is common for babies to spit up a small amount after a feeding.  Babies often swallow air during feeding. This can make babies fussy. Burping your baby between breasts can help with this.  Vitamin D supplements are recommended for babies who get only breast milk.  Avoid using a pacifier during your baby's first 4 6 weeks.  Avoid supplemental feedings of water, formula, or juice in place of breastfeeding. Breast milk is all the food your baby needs. It is not necessary for your baby to have water or formula. Your breasts will make more milk if supplemental feedings are avoided during the early weeks. HOW TO TELL WHETHER YOUR BABY IS GETTING ENOUGH BREAST MILK Wondering whether or not your baby is getting enough milk is a common concern among mothers. You can be assured that your baby is getting enough milk if:   Your baby is actively sucking and you hear swallowing.   Your baby seems relaxed and satisfied after a feeding.   Your baby nurses at least 8 12 times in a 24 hour time period.  During the first 23 68 days of age:  Your baby is wetting at least 3 5 diapers in a 24 hour period. The urine should be clear and pale yellow.  Your baby is having at least  3 4 stools in a 24 hour period. The stool should be soft and yellow.  At 53 44 days of age, your baby is having at least 3 6 stools in a 24 hour period. The stool should be seedy and yellow by 41 days of age.  Your baby has a weight loss less than 7 10% during the first 90 days of age.  Your baby does not lose weight after 68 15 days of age.  Your baby gains 4 7 ounces each week after he or she is 4 days of  age.  Your baby gains weight by 66 days of age and is back to birth weight within 2 weeks. ENGORGEMENT In the first week after your baby is born, you may experience extremely full breasts (engorgement). When engorged, your breasts may feel heavy, warm, or tender to the touch. Engorgement peaks within 24 48 hours after delivery of your baby.  Engorgement may be reduced by:  Continuing to breastfeed.  Increasing the frequency of breastfeeding.  Taking warm showers or applying warm, moist heat to your breasts just before each feeding. This increases circulation and helps the milk flow.   Gently massaging your breast before and during the feedings. With your fingertips, massage from your chest wall towards your nipple in a circular motion.   Ensuring that your baby empties at least one breast at every feeding. It also helps to start the next feeding on the opposite breast.   Expressing breast milk by hand or by using a breast pump to empty the breasts if your baby is sleepy, or not nursing well. You may also want to express milk if you are returning to work oryou feel you are getting engorged.  Ensuring your baby is latched on and positioned properly while breastfeeding. If you follow these suggestions, your engorgement should improve in 24 48 hours. If you are still experiencing difficulty, call your lactation consultant or caregiver.  CARING FOR YOURSELF Take care of your breasts.  Bathe or shower daily.   Avoid using soap on your nipples.   Wear a supportive bra. Avoid wearing  underwire style bras.  Air dry your nipples for a 3 after each feeding.   Use only cotton bra pads to absorb breast milk leakage. Leaking of breast milk between feedings is normal.   Use only pure lanolin on your nipples after nursing. You do not need to wash it off before feeding your baby again. Another option is to express a few drops of breast milk and gently massage that milk into your nipples.  Continue breast self-awareness checks. Take care of yourself.  Eat healthy foods. Alternate 3 meals with 3 snacks.  Avoid foods that you notice affect your baby in a bad way.  Drink milk, fruit juice, and water to satisfy your thirst (about 8 glasses a day).   Rest often, relax, and take your prenatal vitamins to prevent fatigue, stress, and anemia.  Avoid chewing and smoking tobacco.  Avoid alcohol and drug use.  Take over-the-counter and prescribed medicine only as directed by your caregiver or pharmacist. You should always check with your caregiver or pharmacist before taking any new medicine, vitamin, or herbal supplement.  Know that pregnancy is possible while breastfeeding. If desired, talk to your caregiver about family planning and safe birth control methods that may be used while breastfeeding. SEEK MEDICAL CARE IF:   You feel like you want to stop breastfeeding or have become frustrated with breastfeeding.  You have painful breasts or nipples.  Your nipples are cracked or bleeding.  Your breasts are red, tender, or warm.  You have a swollen area on either breast.  You have a fever or chills.  You have nausea or vomiting.  You have drainage from your nipples.  Your breasts do not become full before feedings by the 5th day after delivery.  You feel sad and depressed.  Your baby is too sleepy to eat well.  Your baby is having trouble sleeping.   Your baby is wetting less than 3 diapers in a  24 hour period.  Your baby has less than 3 stools in a  24 hour period.  Your baby's skin or the white part of his or her eyes becomes more yellow.   Your baby is not gaining weight by 47 days of age. MAKE SURE YOU:   Understand these instructions.  Will watch your condition.  Will get help right away if you are not doing well or get worse. Document Released: 08/03/2005 Document Revised: 04/27/2012 Document Reviewed: 03/09/2012 [facility] Patient Information 2014 [place], [place].

## 2013-05-09 NOTE — Progress Notes (Signed)
Having contractions q 10-15 mins apart.--BH contractions.

## 2013-05-09 NOTE — Progress Notes (Signed)
P= [phone]  

## 2013-05-16 ENCOUNTER — Encounter: Payer: Self-pay | Admitting: Obstetrics & Gynecology

## 2013-05-16 ENCOUNTER — Ambulatory Visit (INDEPENDENT_AMBULATORY_CARE_PROVIDER_SITE_OTHER): Payer: Medicaid Other | Admitting: Obstetrics & Gynecology

## 2013-05-16 VITALS — BP 118/63 | Wt 228.0 lb

## 2013-05-16 DIAGNOSIS — Z3480 Encounter for supervision of other normal pregnancy, unspecified trimester: Secondary | ICD-10-CM

## 2013-05-16 DIAGNOSIS — Z3483 Encounter for supervision of other normal pregnancy, third trimester: Secondary | ICD-10-CM

## 2013-05-16 LAB — OB RESULTS CONSOLE GBS: GBS: NEGATIVE

## 2013-05-16 LAB — OB RESULTS CONSOLE GC/CHLAMYDIA
Chlamydia: NEGATIVE
Gonorrhea: NEGATIVE

## 2013-05-16 NOTE — Progress Notes (Signed)
Routine visit. Good FM. No VB or ROM. Labor precautions reviewed. Cervical cultures obtained.

## 2013-05-16 NOTE — Patient Instructions (Signed)

## 2013-05-16 NOTE — Progress Notes (Signed)
[id number] 

## 2013-05-17 LAB — GC/CHLAMYDIA PROBE AMP
CT Probe RNA: NEGATIVE
GC Probe RNA: NEGATIVE

## 2013-05-19 LAB — CULTURE, BETA STREP (GROUP B ONLY)

## 2013-05-23 ENCOUNTER — Encounter: Payer: Self-pay | Admitting: Obstetrics & Gynecology

## 2013-05-23 ENCOUNTER — Ambulatory Visit (INDEPENDENT_AMBULATORY_CARE_PROVIDER_SITE_OTHER): Payer: Medicaid Other | Admitting: Obstetrics & Gynecology

## 2013-05-23 VITALS — BP 87/61 | Wt 225.8 lb

## 2013-05-23 DIAGNOSIS — Z3480 Encounter for supervision of other normal pregnancy, unspecified trimester: Secondary | ICD-10-CM

## 2013-05-23 DIAGNOSIS — Z3483 Encounter for supervision of other normal pregnancy, third trimester: Secondary | ICD-10-CM

## 2013-05-23 NOTE — Progress Notes (Signed)
P - [phone] 

## 2013-05-23 NOTE — Patient Instructions (Signed)
Labor Induction  Most women go into labor on their own between 37 and 42 weeks of the pregnancy. When this does not happen or when there is a medical need, medicine or other methods may be used to induce labor. Labor induction causes a pregnant woman's uterus to contract. It also causes the cervix to soften (ripen), open (dilate), and thin out (efface). Usually, labor is not induced before 39 weeks of the pregnancy unless there is a problem with the baby or mother. Whether your labor will be induced depends on a number of factors, including the following:  The medical condition of you and the baby.  How many weeks along you are.  The status of baby's lung maturity.  The condition of the cervix.  The position of the baby. REASONS FOR LABOR INDUCTION  The health of the baby or mother is at risk.  The pregnancy is overdue by 1 week or more.  The water breaks but labor does not start on its own.  The mother has a health condition or serious illness such as high blood pressure, infection, placental abruption, or diabetes.  The amniotic fluid amounts are low around the baby.  The baby is distressed. REASONS TO NOT INDUCE LABOR Labor induction may not be a good idea if:  It is shown that your baby does not tolerate labor.  An induction is just more convenient.  You want the baby to be born on a certain date, like a holiday.  You have had previous surgeries on your uterus, such as a myomectomy or the removal of fibroids.  Your placenta lies very low in the uterus and blocks the opening of the cervix (placenta previa).  Your baby is not in a head down position.  The umbilical cord drops down into the birth canal in front of the baby. This could cut off the baby's blood and oxygen supply.  You have had a previous cesarean delivery.  There areunusual circumstances, such as the baby being extremely premature. RISKS AND COMPLICATIONS Problems may occur in the process of induction  and plans may need to be modified as a situation unfolds. Some of the risks of induction include:  Change in fetal heart rate, such as too high, too low, or erratic.  Risk of fetal distress.  Risk of infection to mother and baby.  Increased chance of having a cesarean delivery.  The rare, but increased chance that the placenta will separate from the uterus (abruption).  Uterine rupture (very rare). When induction is needed for medical reasons, the benefits of induction may outweigh the risks. BEFORE THE PROCEDURE Your caregiver will check your cervix and the baby's position. This will help your caregiver decide if you are far enough along for an induction to work. PROCEDURE Several methods of labor induction may be used, such as:   Taking prostaglandin medicine to dilate and ripen the cervix. The medicine will also start contractions. It can be taken by mouth or by inserting a suppository into the vagina.  A thin tube (catheter) with a balloon on the end may be inserted into your vagina to dilate the cervix. Once inserted, the balloon expands with water, which causes the cervix to open.  Striping the membranes. Your caregiver inserts a finger between the cervix and membranes, which causes the cervix to be stretched and may cause the uterus to contract. This is often done during an office visit. You will be sent home to wait for the contractions to begin. You will   then come in for an induction.  Breaking the water. Your caregiver will make a hole in the amniotic sac using a small instrument. Once the amniotic sac breaks, contractions should begin. This may still take hours to see an effect.  Taking medicine to trigger or strengthen contractions. This medicine is given intravenously through a tube in your arm. All of the methods of induction, besides stripping the membranes, will be done in the hospital. Induction is done in the hospital so that you and the baby can be carefully  monitored. AFTER THE PROCEDURE Some inductions can take up to 2 or 3 days. Depending on the cervix, it usually takes less time. It takes longer when you are induced early in the pregnancy or if this is your first pregnancy. If a mother is still pregnant and the induction has been going on for 2 to 3 days, either the mother will be sent home or a cesarean delivery will be needed. Document Released: 12/23/2006 Document Revised: 10/26/2011 Document Reviewed: 06/08/2011 [employer] Patient Information 2014 [employer].  

## 2013-05-23 NOTE — Progress Notes (Signed)
Routine visit. Good FM. No problems. Labor precautions reviewed. We discussed her weight loss and she reports a decreased appetite.

## 2013-05-29 ENCOUNTER — Inpatient Hospital Stay (HOSPITAL_COMMUNITY)
Admission: AD | Admit: 2013-05-29 | Discharge: 2013-05-30 | Disposition: A | Discharge status: Home / Self Care | Payer: Medicaid Other | Source: Ambulatory Visit | Attending: Obstetrics and Gynecology | Admitting: Obstetrics and Gynecology

## 2013-05-29 ENCOUNTER — Encounter (HOSPITAL_COMMUNITY): Payer: Self-pay | Admitting: *Deleted

## 2013-05-29 DIAGNOSIS — O99891 Other specified diseases and conditions complicating pregnancy: Secondary | ICD-10-CM | POA: Insufficient documentation

## 2013-05-29 DIAGNOSIS — R197 Diarrhea, unspecified: Secondary | ICD-10-CM | POA: Insufficient documentation

## 2013-05-29 DIAGNOSIS — K5289 Other specified noninfective gastroenteritis and colitis: Secondary | ICD-10-CM | POA: Insufficient documentation

## 2013-05-29 DIAGNOSIS — K529 Noninfective gastroenteritis and colitis, unspecified: Secondary | ICD-10-CM

## 2013-05-29 DIAGNOSIS — O212 Late vomiting of pregnancy: Secondary | ICD-10-CM | POA: Insufficient documentation

## 2013-05-29 LAB — URINALYSIS, ROUTINE W REFLEX MICROSCOPIC
Glucose, UA: NEGATIVE mg/dL
Ketones, ur: NEGATIVE mg/dL
Leukocytes, UA: NEGATIVE
Nitrite: NEGATIVE
Protein, ur: NEGATIVE mg/dL

## 2013-05-29 MED ORDER — ONDANSETRON 8 MG PO TBDP
8.0000 mg | ORAL_TABLET | ORAL | Status: AC
  Administered 2013-05-29: 8 mg via ORAL
  Filled 2013-05-29: qty 1

## 2013-05-29 MED ORDER — ONDANSETRON 8 MG PO TBDP: 8.0000 mg | ORAL_TABLET | Freq: Three times a day (TID) | ORAL | Status: DC | PRN

## 2013-05-29 NOTE — MAU Note (Signed)
Pt may come off monitor per L Leftwich Craige Cotta CNM

## 2013-05-29 NOTE — MAU Provider Note (Signed)
Chief Complaint:  No chief complaint on file.   First Provider Initiated Contact with Patient 05/29/13 2320      HPI: Robyn Ortiz is a 27 y.o. G2P1001 pt of [facility] at [redacted]w[redacted]d who presents to maternity admissions reporting nausea/vomiting/diarrhea after eating summer sausage and mustard at 7 pm tonight.  She reports she was not sick earlier in the day, and was eating and drinking normally.  Then, she ate the sausage at around 7 pm and felt nauseous immediately.  At around 9 pm, she vomited x2-3 and started having diarrhea.  At 9:20 or 9:30 she had her last episode of diarrhea (x3 total).  She reports good fetal movement, denies LOF, vaginal bleeding, vaginal itching/burning, urinary symptoms, h/a, dizziness.  She did feel chills at home but none now.   Past Medical History: Past Medical History  Diagnosis Date  . History of bronchitis 65yrs ago  . Migraine   . History of bleeding ulcers     told not to take ibuprofen    Past obstetric history: OB History  Gravida Para Term Preterm AB SAB TAB Ectopic Multiple Living  2 1 1       1     # Outcome Date GA Lbr Len/2nd Weight Sex Delivery Anes PTL Lv  2 CUR           1 TRM 11/01/03 [redacted]w[redacted]d  3.43 kg (7 lb 9 oz) F SVD EPI N Y     Comments: Patient feels like her due date was estimated wrong and she went way past her due date.      Past Surgical History: Past Surgical History  Procedure Laterality Date  . Tonsillectomy  03/10/2012    Procedure: TONSILLECTOMY;  Surgeon: Suzanna Obey, MD;  Location: [facility] OR;  Service: ENT;  Laterality: Bilateral;    Family History: Family History  Problem Relation Age of Onset  . Diabetes Father   . Heart disease Paternal Grandmother   . Cancer Paternal Grandfather     intestional cancer    Social History: History  Substance Use Topics  . Smoking status: Current Every Day Smoker -- 0.50 packs/day for 13 years    Types: Cigarettes  . Smokeless tobacco: Not on file  . Alcohol Use: No     Allergies:  Allergies  Allergen Reactions  . Codeine Other (See Comments)    Pt. States she had a seizure  . Ibuprofen     Patient was diagnosed with bleeding ulcers and advised not to take this medication.    Meds:  Prescriptions prior to admission  Medication Sig Dispense Refill  . acetaminophen (TYLENOL) 325 MG tablet Take 650 mg by mouth every 6 (six) hours as needed. Pain      . imiquimod (ALDARA) 5 % cream Apply topically 3 (three) times a week.  12 each  12  . Prenatal Vit-Fe Fumarate-FA (PRENATAL MULTIVITAMIN) TABS Take 1 tablet by mouth every morning.      . promethazine (PHENERGAN) 25 MG tablet Take 1 tablet (25 mg total) by mouth every 6 (six) hours as needed for nausea.  30 tablet  2  . albuterol (PROVENTIL HFA;VENTOLIN HFA) 108 (90 BASE) MCG/ACT inhaler Inhale 2 puffs into the lungs every 6 (six) hours as needed for wheezing.      . calcium carbonate (TUMS EX) 750 MG chewable tablet Chew 2 tablets by mouth daily as needed for heartburn.      . hydrocortisone cream 0.5 % Apply topically 2 (two) times  daily.  30 g  0  . ondansetron (ZOFRAN ODT) 4 MG disintegrating tablet Take 1 tablet (4 mg total) by mouth every 6 (six) hours as needed for nausea.  20 tablet  0  . rizatriptan (MAXALT) 10 MG tablet Take 1 tablet (10 mg total) by mouth as needed for migraine. May repeat in 2 hours if needed  12 tablet  11    ROS: Pertinent findings in history of present illness.  Physical Exam  Blood pressure 101/53, pulse 84, temperature 97.9 F (36.6 C), temperature source Oral, resp. rate 20, height 5' 3.5" (1.613 m), weight 103.477 kg (228 lb 2 oz), last menstrual period 08/08/2012. GENERAL: Well-developed, well-nourished female in no acute distress.  HEENT: normocephalic HEART: normal rate RESP: normal effort ABDOMEN: Soft, non-tender, gravid appropriate for gestational age EXTREMITIES: Nontender, no edema NEURO: alert and oriented  Dilation: Closed Effacement (%):  50 Cervical Position: Posterior Exam by:: L Leftwich Kirby  FHT:  Baseline 115, moderate variability, accelerations present, no decelerations Contractions: q 3-4 mins, mild   Labs: Results for orders placed during the hospital encounter of 05/29/13 (from the past 24 hour(s))  URINALYSIS, ROUTINE W REFLEX MICROSCOPIC     Status: None   Collection Time    05/29/13 10:23 PM      Result Value Range   Color, Urine YELLOW  YELLOW   APPearance CLEAR  CLEAR   Specific Gravity, Urine 1.010  1.005 - 1.030   pH 6.0  5.0 - 8.0   Glucose, UA NEGATIVE  NEGATIVE mg/dL   Hgb urine dipstick NEGATIVE  NEGATIVE   Bilirubin Urine NEGATIVE  NEGATIVE   Ketones, ur NEGATIVE  NEGATIVE mg/dL   Protein, ur NEGATIVE  NEGATIVE mg/dL   Urobilinogen, UA 0.2  0.0 - 1.0 mg/dL   Nitrite NEGATIVE  NEGATIVE   Leukocytes, UA NEGATIVE  NEGATIVE     Assessment: 1. Gastroenteritis, noninfectious     Plan: Zofran 8 mg ODT in MAU Discharge home Increase PO fluids Zofran ODT PRN sent to pt pharmacy F/U in office as scheduled Return to MAU as needed     Medication List         acetaminophen 325 MG tablet  Commonly known as:  TYLENOL  Take 650 mg by mouth every 6 (six) hours as needed. Pain     albuterol 108 (90 BASE) MCG/ACT inhaler  Commonly known as:  PROVENTIL HFA;VENTOLIN HFA  Inhale 2 puffs into the lungs every 6 (six) hours as needed for wheezing.     calcium carbonate 750 MG chewable tablet  Commonly known as:  TUMS EX  Chew 2 tablets by mouth daily as needed for heartburn.     hydrocortisone cream 0.5 %  Apply topically 2 (two) times daily.     imiquimod 5 % cream  Commonly known as:  ALDARA  Apply topically 3 (three) times a week.     ondansetron 8 MG disintegrating tablet  Commonly known as:  ZOFRAN-ODT  Take 1 tablet (8 mg total) by mouth every 8 (eight) hours as needed for nausea.     prenatal multivitamin Tabs tablet  Take 1 tablet by mouth every morning.     promethazine 25  MG tablet  Commonly known as:  PHENERGAN  Take 1 tablet (25 mg total) by mouth every 6 (six) hours as needed for nausea.     rizatriptan 10 MG tablet  Commonly known as:  MAXALT  Take 1 tablet (10 mg total) by mouth as needed  for migraine. May repeat in 2 hours if needed           Sharen Counter Certified Nurse-Midwife 05/29/2013 11:21 PM

## 2013-05-29 NOTE — MAU Note (Signed)
PT SAYS SHE  HAS VOMITING-  STARTED  AT 9PM- BUT HAS FELT NAUSEATED SINCE 750PM.      HAS HAD DIARRHEA TONIGHT AT 920PM.    HAD FEVER AT HOME - BUT DOES NOT KNOW TEMP.   HER   DAUGHTER/ STEP- DAUGHTER    HAVE COUGH / CONGESTION.-  NO  V/D.    ATE LAST  AT 8PM-   SUMMER SAUSAGE  WITH MUSTARD.       GETS PNC  AT [facility]-  2 WEEKS - VE  CLOSED.   DENIES HSV AND MRSA.  GBS-  DONE  LAST WEEK.

## 2013-05-30 ENCOUNTER — Ambulatory Visit (INDEPENDENT_AMBULATORY_CARE_PROVIDER_SITE_OTHER): Payer: Medicaid Other | Admitting: Obstetrics & Gynecology

## 2013-05-30 VITALS — BP 91/62 | Wt 224.0 lb

## 2013-05-30 DIAGNOSIS — Z3483 Encounter for supervision of other normal pregnancy, third trimester: Secondary | ICD-10-CM

## 2013-05-30 DIAGNOSIS — Z3480 Encounter for supervision of other normal pregnancy, unspecified trimester: Secondary | ICD-10-CM

## 2013-05-30 NOTE — Progress Notes (Signed)
Seen last night for gastroenteritis; had reactive NST, q 3-4 minute contractions but closed cervix on exam. Still reports nausea and headache today. Recommended drinking plenty of fluids. No other complaints or concerns.  Fetal movement and labor precautions reviewed.

## 2013-05-30 NOTE — Progress Notes (Signed)
Seen in MAU last night for vomiting and diarrhea, no episodes of either today.  Had her cervix checked last night at MAU.

## 2013-05-30 NOTE — Patient Instructions (Signed)
Return to clinic for any obstetric concerns or go to MAU for evaluation  

## 2013-06-05 NOTE — MAU Provider Note (Signed)
Attestation of Attending Supervision of Advanced Practitioner (CNM/NP): Evaluation and management procedures were performed by the Advanced Practitioner under my supervision and collaboration.  I have reviewed the Advanced Practitioner's note and chart, and I agree with the management and plan.  Rigby Swamy 06/05/2013 2:44 PM

## 2013-06-06 ENCOUNTER — Ambulatory Visit (INDEPENDENT_AMBULATORY_CARE_PROVIDER_SITE_OTHER): Payer: Medicaid Other | Admitting: Family Medicine

## 2013-06-06 ENCOUNTER — Encounter: Payer: Self-pay | Admitting: Family Medicine

## 2013-06-06 VITALS — BP 100/50 | Wt 225.0 lb

## 2013-06-06 DIAGNOSIS — Z3480 Encounter for supervision of other normal pregnancy, unspecified trimester: Secondary | ICD-10-CM

## 2013-06-06 DIAGNOSIS — Z3483 Encounter for supervision of other normal pregnancy, third trimester: Secondary | ICD-10-CM

## 2013-06-06 NOTE — Progress Notes (Signed)
P = 77 

## 2013-06-06 NOTE — Patient Instructions (Signed)
Breastfeeding A change in hormones during your pregnancy causes growth of your breast tissue and an increase in number and size of milk ducts. The hormone prolactin allows proteins, sugars, and fats from your blood supply to make breast milk in your milk-producing glands. The hormone progesterone prevents breast milk from being released before the birth of your baby. After the birth of your baby, your progesterone level decreases allowing breast milk to be released. Thoughts of your baby, as well as his or her sucking or crying, can stimulate the release of milk from the milk-producing glands. Deciding to breastfeed (nurse) is one of the best choices you can make for you and your baby. The information that follows gives a brief review of the benefits, as well as other important skills to know about breastfeeding. BENEFITS OF BREASTFEEDING For your baby  The first milk (colostrum) helps your baby's digestive system function better.   There are antibodies in your milk that help your baby fight off infections.   Your baby has a lower incidence of asthma, allergies, and sudden infant death syndrome (SIDS).   The nutrients in breast milk are better for your baby than infant formulas.  Breast milk improves your baby's brain development.   Your baby will have less gas, colic, and constipation.  Your baby is less likely to develop other conditions, such as childhood obesity, asthma, or diabetes mellitus. For you  Breastfeeding helps develop a very special bond between you and your baby.   Breastfeeding is convenient, always available at the correct temperature, and costs nothing.   Breastfeeding helps to burn calories and helps you lose the weight gained during pregnancy.   Breastfeeding makes your uterus contract back down to normal size faster and slows bleeding following delivery.   Breastfeeding mothers have a lower risk of developing osteoporosis or breast or ovarian cancer later  in life.  BREASTFEEDING FREQUENCY  A healthy, full-term baby may breastfeed as often as every hour or space his or her feedings to every 3 hours. Breastfeeding frequency will vary from baby to baby.   Newborns should be fed no less than every 2 3 hours during the day and every 4 5 hours during the night. You should breastfeed a minimum of 8 feedings in a 24 hour period.  Awaken your baby to breastfeed if it has been 3 4 hours since the last feeding.  Breastfeed when you feel the need to reduce the fullness of your breasts or when your newborn shows signs of hunger. Signs that your baby may be hungry include:  Increased alertness or activity.  Stretching.  Movement of the head from side to side.  Movement of the head and opening of the mouth when the corner of the mouth or cheek is stroked (rooting).  Increased sucking sounds, smacking lips, cooing, sighing, or squeaking.  Hand-to-mouth movements.  Increased sucking of fingers or hands.  Fussing.  Intermittent crying.  Signs of extreme hunger will require calming and consoling before you try to feed your baby. Signs of extreme hunger may include:  Restlessness.  A loud, strong cry.  Screaming.  Frequent feeding will help you make more milk and will help prevent problems, such as sore nipples and engorgement of the breasts.  BREASTFEEDING   Whether lying down or sitting, be sure that the baby's abdomen is facing your abdomen.   Support your breast with 4 fingers under your breast and your thumb above your nipple. Make sure your fingers are well away from   your nipple and your baby's mouth.   Stroke your baby's lips gently with your finger or nipple.   When your baby's mouth is open wide enough, place all of your nipple and as much of the colored area around your nipple (areola) as possible into your baby's mouth.  More areola should be visible above his or her upper lip than below his or her lower lip.  Your  baby's tongue should be between his or her lower gum and your breast.  Ensure that your baby's mouth is correctly positioned around the nipple (latched). Your baby's lips should create a seal on your breast.  Signs that your baby has effectively latched onto your nipple include:  Tugging or sucking without pain.  Swallowing heard between sucks.  Absent click or smacking sound.  Muscle movement above and in front of his or her ears with sucking.  Your baby must suck about 2 3 minutes in order to get your milk. Allow your baby to feed on each breast as long as he or she wants. Nurse your baby until he or she unlatches or falls asleep at the first breast, then offer the second breast.  Signs that your baby is full and satisfied include:  A gradual decrease in the number of sucks or complete cessation of sucking.  Falling asleep.  Extension or relaxation of his or her body.  Retention of a small amount of milk in his or her mouth.  Letting go of your breast by himself or herself.  Signs of effective breastfeeding in you include:  Breasts that have increased firmness, weight, and size prior to feeding.  Breasts that are softer after nursing.  Increased milk volume, as well as a change in milk consistency and color by the 5th day of breastfeeding.  Breast fullness relieved by breastfeeding.  Nipples are not sore, cracked, or bleeding.  If needed, break the suction by putting your finger into the corner of your baby's mouth and sliding your finger between his or her gums. Then, remove your breast from his or her mouth.  It is common for babies to spit up a small amount after a feeding.  Babies often swallow air during feeding. This can make babies fussy. Burping your baby between breasts can help with this.  Vitamin D supplements are recommended for babies who get only breast milk.  Avoid using a pacifier during your baby's first 4 6 weeks.  Avoid supplemental feedings of  water, formula, or juice in place of breastfeeding. Breast milk is all the food your baby needs. It is not necessary for your baby to have water or formula. Your breasts will make more milk if supplemental feedings are avoided during the early weeks. HOW TO TELL WHETHER YOUR BABY IS GETTING ENOUGH BREAST MILK Wondering whether or not your baby is getting enough milk is a common concern among mothers. You can be assured that your baby is getting enough milk if:   Your baby is actively sucking and you hear swallowing.   Your baby seems relaxed and satisfied after a feeding.   Your baby nurses at least 8 12 times in a 24 hour time period.  During the first 3 5 days of age:  Your baby is wetting at least 3 5 diapers in a 24 hour period. The urine should be clear and pale yellow.  Your baby is having at least 3 4 stools in a 24 hour period. The stool should be soft and yellow.  At   5 7 days of age, your baby is having at least 3 6 stools in a 24 hour period. The stool should be seedy and yellow by 5 days of age.  Your baby has a weight loss less than 7 10% during the first 3 days of age.  Your baby does not lose weight after 3 7 days of age.  Your baby gains 4 7 ounces each week after he or she is 4 days of age.  Your baby gains weight by 5 days of age and is back to birth weight within 2 weeks. ENGORGEMENT In the first week after your baby is born, you may experience extremely full breasts (engorgement). When engorged, your breasts may feel heavy, warm, or tender to the touch. Engorgement peaks within 24 48 hours after delivery of your baby.  Engorgement may be reduced by:  Continuing to breastfeed.  Increasing the frequency of breastfeeding.  Taking warm showers or applying warm, moist heat to your breasts just before each feeding. This increases circulation and helps the milk flow.   Gently massaging your breast before and during the feedings. With your fingertips, massage from  your chest wall towards your nipple in a circular motion.   Ensuring that your baby empties at least one breast at every feeding. It also helps to start the next feeding on the opposite breast.   Expressing breast milk by hand or by using a breast pump to empty the breasts if your baby is sleepy, or not nursing well. You may also want to express milk if you are returning to work oryou feel you are getting engorged.  Ensuring your baby is latched on and positioned properly while breastfeeding. If you follow these suggestions, your engorgement should improve in 24 48 hours. If you are still experiencing difficulty, call your lactation consultant or caregiver.  CARING FOR YOURSELF Take care of your breasts.  Bathe or shower daily.   Avoid using soap on your nipples.   Wear a supportive bra. Avoid wearing underwire style bras.  Air dry your nipples for a 3 4minutes after each feeding.   Use only cotton bra pads to absorb breast milk leakage. Leaking of breast milk between feedings is normal.   Use only pure lanolin on your nipples after nursing. You do not need to wash it off before feeding your baby again. Another option is to express a few drops of breast milk and gently massage that milk into your nipples.  Continue breast self-awareness checks. Take care of yourself.  Eat healthy foods. Alternate 3 meals with 3 snacks.  Avoid foods that you notice affect your baby in a bad way.  Drink milk, fruit juice, and water to satisfy your thirst (about 8 glasses a day).   Rest often, relax, and take your prenatal vitamins to prevent fatigue, stress, and anemia.  Avoid chewing and smoking tobacco.  Avoid alcohol and drug use.  Take over-the-counter and prescribed medicine only as directed by your caregiver or pharmacist. You should always check with your caregiver or pharmacist before taking any new medicine, vitamin, or herbal supplement.  Know that pregnancy is possible while  breastfeeding. If desired, talk to your caregiver about family planning and safe birth control methods that may be used while breastfeeding. SEEK MEDICAL CARE IF:   You feel like you want to stop breastfeeding or have become frustrated with breastfeeding.  You have painful breasts or nipples.  Your nipples are cracked or bleeding.  Your breasts are red, tender,   or warm.  You have a swollen area on either breast.  You have a fever or chills.  You have nausea or vomiting.  You have drainage from your nipples.  Your breasts do not become full before feedings by the 5th day after delivery.  You feel sad and depressed.  Your baby is too sleepy to eat well.  Your baby is having trouble sleeping.   Your baby is wetting less than 3 diapers in a 24 hour period.  Your baby has less than 3 stools in a 24 hour period.  Your baby's skin or the white part of his or her eyes becomes more yellow.   Your baby is not gaining weight by 5 days of age. MAKE SURE YOU:   Understand these instructions.  Will watch your condition.  Will get help right away if you are not doing well or get worse. Document Released: 08/03/2005 Document Revised: 04/27/2012 Document Reviewed: 03/09/2012 [employer] Patient Information 2014 [employer].  

## 2013-06-06 NOTE — Assessment & Plan Note (Signed)
Continue routine prenatal care.  

## 2013-06-06 NOTE — Progress Notes (Signed)
For NST next visit Labor precautions

## 2013-06-13 ENCOUNTER — Ambulatory Visit (INDEPENDENT_AMBULATORY_CARE_PROVIDER_SITE_OTHER): Payer: Medicaid Other | Admitting: Family Medicine

## 2013-06-13 ENCOUNTER — Encounter: Payer: Self-pay | Admitting: Family Medicine

## 2013-06-13 VITALS — BP 104/70 | Wt 225.6 lb

## 2013-06-13 DIAGNOSIS — O480 Post-term pregnancy: Secondary | ICD-10-CM

## 2013-06-13 DIAGNOSIS — Z3480 Encounter for supervision of other normal pregnancy, unspecified trimester: Secondary | ICD-10-CM

## 2013-06-13 NOTE — Patient Instructions (Addendum)
Pregnancy - Third Trimester The third trimester of pregnancy (the last 3 months) is a period of the most rapid growth for you and your baby. The baby approaches a length of 20 inches and a weight of 6 to 10 pounds. The baby is adding on fat and getting ready for life outside your body. While inside, babies have periods of sleeping and waking, sucking thumbs, and hiccuping. You can often feel small contractions of the uterus. This is false labor. It is also called Braxton-Hicks contractions. This is like a practice for labor. The usual problems in this stage of pregnancy include more difficulty breathing, swelling of the hands and feet from water retention, and having to urinate more often because of the uterus and baby pressing on your bladder.  PRENATAL EXAMS  Blood work may continue to be done during prenatal exams. These tests are done to check on your health and the probable health of your baby. Blood work is used to follow your blood levels (hemoglobin). Anemia (low hemoglobin) is common during pregnancy. Iron and vitamins are given to help prevent this. You may also continue to be checked for diabetes. Some of the past blood tests may be done again.  The size of the uterus is measured during each visit. This makes sure your baby is growing properly according to your pregnancy dates.  Your blood pressure is checked every prenatal visit. This is to make sure you are not getting toxemia.  Your urine is checked every prenatal visit for infection, diabetes, and protein.  Your weight is checked at each visit. This is done to make sure gains are happening at the suggested rate and that you and your baby are growing normally.  Sometimes, an ultrasound is performed to confirm the position and the proper growth and development of the baby. This is a test done that bounces harmless sound waves off the baby so your caregiver can more accurately determine a due date.  Discuss the type of pain medicine and  anesthesia you will have during your labor and delivery.  Discuss the possibility and anesthesia if a cesarean section might be necessary.  Inform your caregiver if there is any mental or physical violence at home. Sometimes, a specialized non-stress test, contraction stress test, and biophysical profile are done to make sure the baby is not having a problem. Checking the amniotic fluid surrounding the baby is called an amniocentesis. The amniotic fluid is removed by sticking a needle into the belly (abdomen). This is sometimes done near the end of pregnancy if an early delivery is required. In this case, it is done to help make sure the baby's lungs are mature enough for the baby to live outside of the womb. If the lungs are not mature and it is unsafe to deliver the baby, an injection of cortisone medicine is given to the mother 1 to 2 days before the delivery. This helps the baby's lungs mature and makes it safer to deliver the baby. CHANGES OCCURING IN THE THIRD TRIMESTER OF PREGNANCY Your body goes through many changes during pregnancy. They vary from person to person. Talk to your caregiver about changes you notice and are concerned about.  During the last trimester, you have probably had an increase in your appetite. It is normal to have cravings for certain foods. This varies from person to person and pregnancy to pregnancy.  You may begin to get stretch marks on your hips, abdomen, and breasts. These are normal changes in the body   during pregnancy. There are no exercises or medicines to take which prevent this change.  Constipation may be treated with a stool softener or adding bulk to your diet. Drinking lots of fluids, fiber in vegetables, fruits, and whole grains are helpful.  Exercising is also helpful. If you have been very active up until your pregnancy, most of these activities can be continued during your pregnancy. If you have been less active, it is helpful to start an exercise  program such as walking. Consult your caregiver before starting exercise programs.  Avoid all smoking, alcohol, non-prescribed drugs, herbs and "street drugs" during your pregnancy. These chemicals affect the formation and growth of the baby. Avoid chemicals throughout the pregnancy to ensure the delivery of a healthy infant.  Backache, varicose veins, and hemorrhoids may develop or get worse.  You will tire more easily in the third trimester, which is normal.  The baby's movements may be stronger and more often.  You may become short of breath easily.  Your belly button may stick out.  A yellow discharge may leak from your breasts called colostrum.  You may have a bloody mucus discharge. This usually occurs a few days to a week before labor begins. HOME CARE INSTRUCTIONS   Keep your caregiver's appointments. Follow your caregiver's instructions regarding medicine use, exercise, and diet.  During pregnancy, you are providing food for you and your baby. Continue to eat regular, well-balanced meals. Choose foods such as meat, fish, milk and other low fat dairy products, vegetables, fruits, and whole-grain breads and cereals. Your caregiver will tell you of the ideal weight gain.  A physical sexual relationship may be continued throughout pregnancy if there are no other problems such as early (premature) leaking of amniotic fluid from the membranes, vaginal bleeding, or belly (abdominal) pain.  Exercise regularly if there are no restrictions. Check with your caregiver if you are unsure of the safety of your exercises. Greater weight gain will occur in the last 2 trimesters of pregnancy. Exercising helps:  Control your weight.  Get you in shape for labor and delivery.  You lose weight after you deliver.  Rest a lot with legs elevated, or as needed for leg cramps or low back pain.  Wear a good support or jogging bra for breast tenderness during pregnancy. This may help if worn during  sleep. Pads or tissues may be used in the bra if you are leaking colostrum.  Do not use hot tubs, steam rooms, or saunas.  Wear your seat belt when driving. This protects you and your baby if you are in an accident.  Avoid raw meat, cat litter boxes and soil used by cats. These carry germs that can cause birth defects in the baby.  It is easier to leak urine during pregnancy. Tightening up and strengthening the pelvic muscles will help with this problem. You can practice stopping your urination while you are going to the bathroom. These are the same muscles you need to strengthen. It is also the muscles you would use if you were trying to stop from passing gas. You can practice tightening these muscles up 10 times a set and repeating this about 3 times per day. Once you know what muscles to tighten up, do not perform these exercises during urination. It is more likely to cause an infection by backing up the urine.  Ask for help if you have financial, counseling, or nutritional needs during pregnancy. Your caregiver will be able to offer counseling for these   needs as well as refer you for other special needs.  Make a list of emergency phone numbers and have them available.  Plan on getting help from family or friends when you go home from the hospital.  Make a trial run to the hospital.  Take prenatal classes with the father to understand, practice, and ask questions about the labor and delivery.  Prepare the baby's room or nursery.  Do not travel out of the city unless it is absolutely necessary and with the advice of your caregiver.  Wear only low or no heal shoes to have better balance and prevent falling. MEDICINES AND DRUG USE IN PREGNANCY  Take prenatal vitamins as directed. The vitamin should contain 1 milligram of folic acid. Keep all vitamins out of reach of children. Only a couple vitamins or tablets containing iron may be fatal to a baby or young child when ingested.  Avoid use  of all medicines, including herbs, over-the-counter medicines, not prescribed or suggested by your caregiver. Only take over-the-counter or prescription medicines for pain, discomfort, or fever as directed by your caregiver. Do not use aspirin, ibuprofen or naproxen unless approved by your caregiver.  Let your caregiver also know about herbs you may be using.  Alcohol is related to a number of birth defects. This includes fetal alcohol syndrome. All alcohol, in any form, should be avoided completely. Smoking will cause low birth rate and premature babies.  Illegal drugs are very harmful to the baby. They are absolutely forbidden. A baby born to an addicted mother will be addicted at birth. The baby will go through the same withdrawal an adult does. SEEK MEDICAL CARE IF: You have any concerns or worries during your pregnancy. It is better to call with your questions if you feel they cannot wait, rather than worry about them. SEEK IMMEDIATE MEDICAL CARE IF:   An unexplained oral temperature above 102 F (38.9 C) develops, or as your caregiver suggests.  You have leaking of fluid from the vagina. If leaking membranes are suspected, take your temperature and tell your caregiver of this when you call.  There is vaginal spotting, bleeding or passing clots. Tell your caregiver of the amount and how many pads are used.  You develop a bad smelling vaginal discharge with a change in the color from clear to white.  You develop vomiting that lasts more than 24 hours.  You develop chills or fever.  You develop shortness of breath.  You develop burning on urination.  You loose more than 2 pounds of weight or gain more than 2 pounds of weight or as suggested by your caregiver.  You notice sudden swelling of your face, hands, and feet or legs.  You develop belly (abdominal) pain. Round ligament discomfort is a common non-cancerous (benign) cause of abdominal pain in pregnancy. Your caregiver still  must evaluate you.  You develop a severe headache that does not go away.  You develop visual problems, blurred or double vision.  If you have not felt your baby move for more than 1 hour. If you think the baby is not moving as much as usual, eat something with sugar in it and lie down on your left side for an hour. The baby should move at least 4 to 5 times per hour. Call right away if your baby moves less than that.  You fall, are in a car accident, or any kind of trauma.  There is mental or physical violence at home. Document Released: 07/28/2001   Document Revised: 04/27/2012 Document Reviewed: 01/30/2009 [employer] Patient Information 2014 [employer].  Breastfeeding A change in hormones during your pregnancy causes growth of your breast tissue and an increase in number and size of milk ducts. The hormone prolactin allows proteins, sugars, and fats from your blood supply to make breast milk in your milk-producing glands. The hormone progesterone prevents breast milk from being released before the birth of your baby. After the birth of your baby, your progesterone level decreases allowing breast milk to be released. Thoughts of your baby, as well as his or her sucking or crying, can stimulate the release of milk from the milk-producing glands. Deciding to breastfeed (nurse) is one of the best choices you can make for you and your baby. The information that follows gives a brief review of the benefits, as well as other important skills to know about breastfeeding. BENEFITS OF BREASTFEEDING For your baby  The first milk (colostrum) helps your baby's digestive system function better.   There are antibodies in your milk that help your baby fight off infections.   Your baby has a lower incidence of asthma, allergies, and sudden infant death syndrome (SIDS).   The nutrients in breast milk are better for your baby than infant formulas.  Breast milk improves your baby's brain development.    Your baby will have less gas, colic, and constipation.  Your baby is less likely to develop other conditions, such as childhood obesity, asthma, or diabetes mellitus. For you  Breastfeeding helps develop a very special bond between you and your baby.   Breastfeeding is convenient, always available at the correct temperature, and costs nothing.   Breastfeeding helps to burn calories and helps you lose the weight gained during pregnancy.   Breastfeeding makes your uterus contract back down to normal size faster and slows bleeding following delivery.   Breastfeeding mothers have a lower risk of developing osteoporosis or breast or ovarian cancer later in life.  BREASTFEEDING FREQUENCY  A healthy, full-term baby may breastfeed as often as every hour or space his or her feedings to every 3 hours. Breastfeeding frequency will vary from baby to baby.   Newborns should be fed no less than every 2 3 hours during the day and every 4 5 hours during the night. You should breastfeed a minimum of 8 feedings in a 24 hour period.  Awaken your baby to breastfeed if it has been 3 4 hours since the last feeding.  Breastfeed when you feel the need to reduce the fullness of your breasts or when your newborn shows signs of hunger. Signs that your baby may be hungry include:  Increased alertness or activity.  Stretching.  Movement of the head from side to side.  Movement of the head and opening of the mouth when the corner of the mouth or cheek is stroked (rooting).  Increased sucking sounds, smacking lips, cooing, sighing, or squeaking.  Hand-to-mouth movements.  Increased sucking of fingers or hands.  Fussing.  Intermittent crying.  Signs of extreme hunger will require calming and consoling before you try to feed your baby. Signs of extreme hunger may include:  Restlessness.  A loud, strong cry.  Screaming.  Frequent feeding will help you make more milk and will help prevent  problems, such as sore nipples and engorgement of the breasts.  BREASTFEEDING   Whether lying down or sitting, be sure that the baby's abdomen is facing your abdomen.   Support your breast with 4 fingers under your breast   and your thumb above your nipple. Make sure your fingers are well away from your nipple and your baby's mouth.   Stroke your baby's lips gently with your finger or nipple.   When your baby's mouth is open wide enough, place all of your nipple and as much of the colored area around your nipple (areola) as possible into your baby's mouth.  More areola should be visible above his or her upper lip than below his or her lower lip.  Your baby's tongue should be between his or her lower gum and your breast.  Ensure that your baby's mouth is correctly positioned around the nipple (latched). Your baby's lips should create a seal on your breast.  Signs that your baby has effectively latched onto your nipple include:  Tugging or sucking without pain.  Swallowing heard between sucks.  Absent click or smacking sound.  Muscle movement above and in front of his or her ears with sucking.  Your baby must suck about 2 3 minutes in order to get your milk. Allow your baby to feed on each breast as long as he or she wants. Nurse your baby until he or she unlatches or falls asleep at the first breast, then offer the second breast.  Signs that your baby is full and satisfied include:  A gradual decrease in the number of sucks or complete cessation of sucking.  Falling asleep.  Extension or relaxation of his or her body.  Retention of a small amount of milk in his or her mouth.  Letting go of your breast by himself or herself.  Signs of effective breastfeeding in you include:  Breasts that have increased firmness, weight, and size prior to feeding.  Breasts that are softer after nursing.  Increased milk volume, as well as a change in milk consistency and color by the 5th  day of breastfeeding.  Breast fullness relieved by breastfeeding.  Nipples are not sore, cracked, or bleeding.  If needed, break the suction by putting your finger into the corner of your baby's mouth and sliding your finger between his or her gums. Then, remove your breast from his or her mouth.  It is common for babies to spit up a small amount after a feeding.  Babies often swallow air during feeding. This can make babies fussy. Burping your baby between breasts can help with this.  Vitamin D supplements are recommended for babies who get only breast milk.  Avoid using a pacifier during your baby's first 4 6 weeks.  Avoid supplemental feedings of water, formula, or juice in place of breastfeeding. Breast milk is all the food your baby needs. It is not necessary for your baby to have water or formula. Your breasts will make more milk if supplemental feedings are avoided during the early weeks. HOW TO TELL WHETHER YOUR BABY IS GETTING ENOUGH BREAST MILK Wondering whether or not your baby is getting enough milk is a common concern among mothers. You can be assured that your baby is getting enough milk if:   Your baby is actively sucking and you hear swallowing.   Your baby seems relaxed and satisfied after a feeding.   Your baby nurses at least 8 12 times in a 24 hour time period.  During the first 3 5 days of age:  Your baby is wetting at least 3 5 diapers in a 24 hour period. The urine should be clear and pale yellow.  Your baby is having at least 3 4 stools in   a 24 hour period. The stool should be soft and yellow.  At 5 7 days of age, your baby is having at least 3 6 stools in a 24 hour period. The stool should be seedy and yellow by 5 days of age.  Your baby has a weight loss less than 7 10% during the first 3 days of age.  Your baby does not lose weight after 3 7 days of age.  Your baby gains 4 7 ounces each week after he or she is 4 days of age.  Your baby gains weight  by 5 days of age and is back to birth weight within 2 weeks. ENGORGEMENT In the first week after your baby is born, you may experience extremely full breasts (engorgement). When engorged, your breasts may feel heavy, warm, or tender to the touch. Engorgement peaks within 24 48 hours after delivery of your baby.  Engorgement may be reduced by:  Continuing to breastfeed.  Increasing the frequency of breastfeeding.  Taking warm showers or applying warm, moist heat to your breasts just before each feeding. This increases circulation and helps the milk flow.   Gently massaging your breast before and during the feedings. With your fingertips, massage from your chest wall towards your nipple in a circular motion.   Ensuring that your baby empties at least one breast at every feeding. It also helps to start the next feeding on the opposite breast.   Expressing breast milk by hand or by using a breast pump to empty the breasts if your baby is sleepy, or not nursing well. You may also want to express milk if you are returning to work oryou feel you are getting engorged.  Ensuring your baby is latched on and positioned properly while breastfeeding. If you follow these suggestions, your engorgement should improve in 24 48 hours. If you are still experiencing difficulty, call your lactation consultant or caregiver.  CARING FOR YOURSELF Take care of your breasts.  Bathe or shower daily.   Avoid using soap on your nipples.   Wear a supportive bra. Avoid wearing underwire style bras.  Air dry your nipples for a 3 4minutes after each feeding.   Use only cotton bra pads to absorb breast milk leakage. Leaking of breast milk between feedings is normal.   Use only pure lanolin on your nipples after nursing. You do not need to wash it off before feeding your baby again. Another option is to express a few drops of breast milk and gently massage that milk into your nipples.  Continue breast  self-awareness checks. Take care of yourself.  Eat healthy foods. Alternate 3 meals with 3 snacks.  Avoid foods that you notice affect your baby in a bad way.  Drink milk, fruit juice, and water to satisfy your thirst (about 8 glasses a day).   Rest often, relax, and take your prenatal vitamins to prevent fatigue, stress, and anemia.  Avoid chewing and smoking tobacco.  Avoid alcohol and drug use.  Take over-the-counter and prescribed medicine only as directed by your caregiver or pharmacist. You should always check with your caregiver or pharmacist before taking any new medicine, vitamin, or herbal supplement.  Know that pregnancy is possible while breastfeeding. If desired, talk to your caregiver about family planning and safe birth control methods that may be used while breastfeeding. SEEK MEDICAL CARE IF:   You feel like you want to stop breastfeeding or have become frustrated with breastfeeding.  You have painful breasts or nipples.    Your nipples are cracked or bleeding.  Your breasts are red, tender, or warm.  You have a swollen area on either breast.  You have a fever or chills.  You have nausea or vomiting.  You have drainage from your nipples.  Your breasts do not become full before feedings by the 5th day after delivery.  You feel sad and depressed.  Your baby is too sleepy to eat well.  Your baby is having trouble sleeping.   Your baby is wetting less than 3 diapers in a 24 hour period.  Your baby has less than 3 stools in a 24 hour period.  Your baby's skin or the white part of his or her eyes becomes more yellow.   Your baby is not gaining weight by 5 days of age. MAKE SURE YOU:   Understand these instructions.  Will watch your condition.  Will get help right away if you are not doing well or get worse. Document Released: 08/03/2005 Document Revised: 04/27/2012 Document Reviewed: 03/09/2012 [employer] Patient Information 2014 [employer]. Vaginal Delivery Your caregiver must first be sure you are in labor. Signs of labor include:  You may pass what is called "the mucus plug" before labor begins. This is a small amount of blood stained mucus.  Regular uterine contractions.  The time between contractions get closer together.  The discomfort and pain gradually gets more intense.  Pains are mostly located in the back.  Pains get worse when walking.  The cervix (the opening of the uterus) becomes thinner (begins to efface) and opens up (dilates). Once you are in labor and admitted into the hospital or care center, your caregiver will do the following:  A complete physical examination.  Check your vital signs (blood pressure, pulse, temperature and the fetal heart rate).  Do a vaginal examination (using a sterile glove and lubricant) to determine:  The position (presentation) of the baby (head [vertex] or buttock first).  The level (station) of the baby's head in the birth canal.  The effacement and dilatation of the cervix.  You may have your pubic hair shaved and be given an enema depending on your caregiver and the circumstance.  An electronic monitor is usually placed on your abdomen. The monitor follows the length and intensity of the contractions, as well as the baby's heart rate.  Usually, your caregiver will insert an IV in your arm with a bottle of sugar water. This is done as a precaution so that medications can be given to you quickly during labor or delivery. NORMAL LABOR AND DELIVERY IS DIVIDED UP INTO 3 STAGES: First Stage This is when regular contractions begin and the cervix begins to efface and dilate. This stage can last from 3 to 15 hours. The end of the first stage is when the cervix is 100% effaced and 10 centimeters dilated. Pain medications may be given by   Injection (morphine, demerol, etc.)  Regional anesthesia (spinal, caudal or epidural, anesthetics given in different locations of  the spine). Paracervical pain medication may be given, which is an injection of and anesthetic on each side of the cervix. A pregnant woman may request to have "Natural Childbirth" which is not to have any medications or anesthesia during her labor and delivery. Second Stage This is when the baby comes down through the birth canal (vagina) and is born. This can take 1 to 4 hours. As the baby's head comes down through the birth canal, you may feel like you are going   to have a bowel movement. You will get the urge to bear down and push until the baby is delivered. As the baby's head is being delivered, the caregiver will decide if an episiotomy (a cut in the perineum and vagina area) is needed to prevent tearing of the tissue in this area. The episiotomy is sewn up after the delivery of the baby and placenta. Sometimes a mask with nitrous oxide is given for the mother to breath during the delivery of the baby to help if there is too much pain. The end of Stage 2 is when the baby is fully delivered. Then when the umbilical cord stops pulsating it is clamped and cut. Third Stage The third stage begins after the baby is completely delivered and ends after the placenta (afterbirth) is delivered. This usually takes 5 to 30 minutes. After the placenta is delivered, a medication is given either by intravenous or injection to help contract the uterus and prevent bleeding. The third stage is not painful and pain medication is usually not necessary. If an episiotomy was done, it is repaired at this time. After the delivery, the mother is watched and monitored closely for 1 to 2 hours to make sure there is no postpartum bleeding (hemorrhage). If there is a lot of bleeding, medication is given to contract the uterus and stop the bleeding. Document Released: 05/12/2008 Document Revised: 04/27/2012 Document Reviewed: 05/12/2008 Exit[employer] Patient Information 2014 [employer].  

## 2013-06-13 NOTE — Progress Notes (Signed)
NST reviewed and reactive. AFI-7.9 today Schedule IOL at 41 wks.

## 2013-06-13 NOTE — Progress Notes (Signed)
P - 74 

## 2013-06-14 ENCOUNTER — Telehealth (HOSPITAL_COMMUNITY): Payer: Self-pay | Admitting: *Deleted

## 2013-06-14 ENCOUNTER — Encounter (HOSPITAL_COMMUNITY): Payer: Self-pay | Admitting: *Deleted

## 2013-06-14 NOTE — Telephone Encounter (Signed)
Preadmission screen  

## 2013-06-17 ENCOUNTER — Encounter (HOSPITAL_COMMUNITY): Payer: Self-pay

## 2013-06-17 ENCOUNTER — Inpatient Hospital Stay (HOSPITAL_COMMUNITY): Payer: Medicaid Other | Admitting: Anesthesiology

## 2013-06-17 ENCOUNTER — Inpatient Hospital Stay (HOSPITAL_COMMUNITY)
Admission: RE | Admit: 2013-06-17 | Discharge: 2013-06-19 | DRG: 775 | Disposition: A | Discharge status: Home / Self Care | Payer: Medicaid Other | Source: Ambulatory Visit | Attending: Obstetrics and Gynecology | Admitting: Obstetrics and Gynecology

## 2013-06-17 ENCOUNTER — Encounter (HOSPITAL_COMMUNITY): Payer: Medicaid Other | Admitting: Anesthesiology

## 2013-06-17 VITALS — BP 107/64 | HR 65 | Temp 98.0°F | Resp 18 | Ht 65.0 in | Wt 225.0 lb

## 2013-06-17 DIAGNOSIS — O480 Post-term pregnancy: Secondary | ICD-10-CM

## 2013-06-17 DIAGNOSIS — E669 Obesity, unspecified: Secondary | ICD-10-CM

## 2013-06-17 DIAGNOSIS — O4452 Low lying placenta with hemorrhage, second trimester: Secondary | ICD-10-CM

## 2013-06-17 DIAGNOSIS — J45909 Unspecified asthma, uncomplicated: Secondary | ICD-10-CM

## 2013-06-17 DIAGNOSIS — Z302 Encounter for sterilization: Secondary | ICD-10-CM

## 2013-06-17 DIAGNOSIS — Z3483 Encounter for supervision of other normal pregnancy, third trimester: Secondary | ICD-10-CM

## 2013-06-17 DIAGNOSIS — O99334 Smoking (tobacco) complicating childbirth: Secondary | ICD-10-CM | POA: Diagnosis present

## 2013-06-17 LAB — CBC
HCT: 36.7 % (ref 36.0–46.0)
MCHC: 34.1 g/dL (ref 30.0–36.0)
MCV: 90 fL (ref 78.0–100.0)
RDW: 13.5 % (ref 11.5–15.5)
WBC: 6.6 10*3/uL (ref 4.0–10.5)

## 2013-06-17 LAB — TYPE AND SCREEN

## 2013-06-17 LAB — RPR: RPR Ser Ql: NONREACTIVE

## 2013-06-17 MED ORDER — FENTANYL 2.5 MCG/ML BUPIVACAINE 1/10 % EPIDURAL INFUSION (WH - ANES)
INTRAMUSCULAR | Status: DC | PRN
  Administered 2013-06-17: 14 mL/h via EPIDURAL

## 2013-06-17 MED ORDER — OXYTOCIN 40 UNITS IN LACTATED RINGERS INFUSION - SIMPLE MED: 62.5000 mL/h | INTRAVENOUS | Status: DC

## 2013-06-17 MED ORDER — LACTATED RINGERS IV SOLN
INTRAVENOUS | Status: DC
  Administered 2013-06-17: 22:00:00 via INTRAUTERINE

## 2013-06-17 MED ORDER — TERBUTALINE SULFATE 1 MG/ML IJ SOLN: 0.2500 mg | Freq: Once | INTRAMUSCULAR | Status: AC | PRN

## 2013-06-17 MED ORDER — PHENYLEPHRINE 40 MCG/ML (10ML) SYRINGE FOR IV PUSH (FOR BLOOD PRESSURE SUPPORT)
80.0000 ug | PREFILLED_SYRINGE | INTRAVENOUS | Status: DC | PRN
Start: 2013-06-17 — End: 2013-06-18
  Filled 2013-06-17: qty 2

## 2013-06-17 MED ORDER — LIDOCAINE HCL (PF) 1 % IJ SOLN
30.0000 mL | INTRAMUSCULAR | Status: DC | PRN
  Filled 2013-06-17 (×2): qty 30

## 2013-06-17 MED ORDER — HYDROXYZINE HCL 50 MG PO TABS
50.0000 mg | ORAL_TABLET | Freq: Four times a day (QID) | ORAL | Status: DC | PRN
  Filled 2013-06-17: qty 1

## 2013-06-17 MED ORDER — OXYTOCIN 40 UNITS IN LACTATED RINGERS INFUSION - SIMPLE MED: 1.0000 m[IU]/min | INTRAVENOUS | Status: DC

## 2013-06-17 MED ORDER — EPHEDRINE 5 MG/ML INJ
10.0000 mg | INTRAVENOUS | Status: DC | PRN
Start: 2013-06-17 — End: 2013-06-18
  Administered 2013-06-17: 10 mg via INTRAVENOUS
  Filled 2013-06-17: qty 2

## 2013-06-17 MED ORDER — ONDANSETRON HCL 4 MG/2ML IJ SOLN: 4.0000 mg | Freq: Four times a day (QID) | INTRAMUSCULAR | Status: DC | PRN

## 2013-06-17 MED ORDER — FLEET ENEMA 7-19 GM/118ML RE ENEM: 1.0000 | ENEMA | RECTAL | Status: DC | PRN

## 2013-06-17 MED ORDER — LACTATED RINGERS IV SOLN
500.0000 mL | INTRAVENOUS | Status: DC | PRN
  Administered 2013-06-17 (×2): 1000 mL via INTRAVENOUS

## 2013-06-17 MED ORDER — LIDOCAINE HCL (PF) 1 % IJ SOLN
INTRAMUSCULAR | Status: DC | PRN
  Administered 2013-06-17 (×2): 4 mL

## 2013-06-17 MED ORDER — OXYTOCIN BOLUS FROM INFUSION: 500.0000 mL | INTRAVENOUS | Status: DC

## 2013-06-17 MED ORDER — FENTANYL 2.5 MCG/ML BUPIVACAINE 1/10 % EPIDURAL INFUSION (WH - ANES)
14.0000 mL/h | INTRAMUSCULAR | Status: DC | PRN
  Filled 2013-06-17: qty 125

## 2013-06-17 MED ORDER — DIPHENHYDRAMINE HCL 50 MG/ML IJ SOLN: 12.5000 mg | INTRAMUSCULAR | Status: DC | PRN

## 2013-06-17 MED ORDER — EPHEDRINE 5 MG/ML INJ
10.0000 mg | INTRAVENOUS | Status: DC | PRN
  Administered 2013-06-17: 10 mg via INTRAVENOUS
  Filled 2013-06-17 (×2): qty 4
  Filled 2013-06-17: qty 2

## 2013-06-17 MED ORDER — CITRIC ACID-SODIUM CITRATE 334-500 MG/5ML PO SOLN
30.0000 mL | ORAL | Status: DC | PRN
  Filled 2013-06-17: qty 15

## 2013-06-17 MED ORDER — FENTANYL CITRATE 0.05 MG/ML IJ SOLN
100.0000 ug | INTRAMUSCULAR | Status: DC | PRN
  Administered 2013-06-17 (×2): 100 ug via INTRAVENOUS
  Filled 2013-06-17 (×2): qty 2

## 2013-06-17 MED ORDER — ACETAMINOPHEN 325 MG PO TABS
650.0000 mg | ORAL_TABLET | ORAL | Status: DC | PRN
  Administered 2013-06-17 – 2013-06-18 (×3): 650 mg via ORAL
  Filled 2013-06-17 (×3): qty 2

## 2013-06-17 MED ORDER — OXYTOCIN 40 UNITS IN LACTATED RINGERS INFUSION - SIMPLE MED
1.0000 m[IU]/min | INTRAVENOUS | Status: DC
  Administered 2013-06-17: 2 m[IU]/min via INTRAVENOUS
  Filled 2013-06-17: qty 1000

## 2013-06-17 MED ORDER — LACTATED RINGERS IV SOLN: 500.0000 mL | Freq: Once | INTRAVENOUS | Status: DC

## 2013-06-17 MED ORDER — LACTATED RINGERS IV SOLN
INTRAVENOUS | Status: DC
  Administered 2013-06-17 (×2): via INTRAVENOUS

## 2013-06-17 MED ORDER — PHENYLEPHRINE 40 MCG/ML (10ML) SYRINGE FOR IV PUSH (FOR BLOOD PRESSURE SUPPORT)
80.0000 ug | PREFILLED_SYRINGE | INTRAVENOUS | Status: DC | PRN
  Filled 2013-06-17: qty 2
  Filled 2013-06-17: qty 10

## 2013-06-17 NOTE — Anesthesia Procedure Notes (Signed)
Epidural Patient location during procedure: OB Start time: 06/17/2013 3:45 PM End time: 06/17/2013 3:55 PM  Staffing Anesthesiologist: Lewie Loron R  Preanesthetic Checklist Completed: patient identified, pre-op evaluation, timeout performed, IV checked, risks and benefits discussed and monitors and equipment checked  Epidural Patient position: sitting Prep: site prepped and draped and DuraPrep Patient monitoring: heart rate, continuous pulse ox and blood pressure Approach: midline Injection technique: LOR air and LOR saline  Needle:  Needle type: Tuohy  Needle gauge: 17 G Needle length: 9 cm Needle insertion depth: 6 cm Catheter type: closed end flexible Catheter at skin depth: 12 cm Test dose: negative  Assessment Sensory level: T6 Events: blood not aspirated, injection not painful, no injection resistance, negative IV test and no paresthesia  Additional Notes Reason for block:procedure for pain

## 2013-06-17 NOTE — Progress Notes (Signed)
Robyn Ortiz is a 27 y.o. G2P1001 at [redacted]w[redacted]d admitted for induction of labor due to postdates. 1st shift RN states hasn't been able to go past 29mu/min on pitocin d/t lates  Subjective: Comfortable w/ epidural, HA base of head/neck  Objective: BP 92/55  Pulse 59  Temp(Src) 98.7 F (37.1 C) (Oral)  Resp 18  Ht 5\' 5"  (1.651 m)  Wt 102.059 kg (225 lb)  BMI 37.44 kg/m2  SpO2 100%  LMP 08/08/2012    BPs: 80-90s/30-50s  FHT:  FHR: 125 bpm, variability: moderate,  accelerations:  Present,  decelerations:  Present indeterminate decels, uc's not tracing d/t maternal position UC:   regular, every 2-4 minutes, not tracing well d/t maternal position, despite readjusting toco multiple times. IUPC inserted w/o difficulty SVE:  3.5/60/-2, IUPC inserted w/o difficulty Decels identified as earlies after IUPC in place  Labs: Lab Results  Component Value Date   WBC 6.6 06/17/2013   HGB 12.5 06/17/2013   HCT 36.7 06/17/2013   MCV 90.0 06/17/2013   PLT 221 06/17/2013    Assessment / Plan: IOL d/t postdates, pitocin at 30mu/min, hypotension s/p epidural. RN to give meds per anesthesia orders to help bring bp back to pt's normal range. Increase pitocin per protocol as needed to achieve adequate labor/MVUs  Labor: early Preeclampsia:  n/a Fetal Wellbeing:  Category I Pain Control:  Epidural I/D:  n/a Anticipated MOD:  NSVD  Marge Duncans 06/17/2013, 7:38 PM

## 2013-06-17 NOTE — Progress Notes (Addendum)
Robyn Ortiz is a 27 y.o. G2P1001 at [redacted]w[redacted]d admitted for induction of labor due to postdates.  Subjective: Comfortable w/ epidural, no complaints  Objective: BP 98/66  Pulse 55  Temp(Src) 97.6 F (36.4 C) (Oral)  Resp 18  Ht 5\' 5"  (1.651 m)  Wt 102.059 kg (225 lb)  BMI 37.44 kg/m2  SpO2 100%  LMP 08/08/2012      FHT:  FHR: 115 bpm, variability: moderate,  accelerations:  Present,  decelerations:  Absent UC:   regular, every 2-5 minutes SVE:   Dilation: 3.5 Effacement (%): 50 Station: -2 Exam by:: hk Foley bulb fell out  Labs: Lab Results  Component Value Date   WBC 6.6 06/17/2013   HGB 12.5 06/17/2013   HCT 36.7 06/17/2013   MCV 90.0 06/17/2013   PLT 221 06/17/2013    Assessment / Plan: IOL d/t postdates, cervical foley bulb fell out, just got epidural, will begin pitocin per protocol  Labor: s/p cervical ripening Preeclampsia:  n/a Fetal Wellbeing:  Category I Pain Control:  Epidural I/D:  n/a Anticipated MOD:  NSVD  Marge Duncans 06/17/2013, 1640

## 2013-06-17 NOTE — H&P (Signed)
Robyn Ortiz is a 27 y.o. G2P1001 female at [redacted]w[redacted]d by 7.5wk u/s, presenting for IOL d/t postdates.  Reports good fm, occ uc's.  Denies lof or vb.  Initated pnc at [facility] at 11.1wks, NT increased w/ normal AFP, early 1hr glucola 106, repeat 60, normal anatomy u/s, gbs neg. Had low lying posterior placenta that resolved.  H/O asthma, smoker, migraines, obesity, perianal condyloma. Desires pp BTL, discussed permanency, increased likelihood for regret when done at a young age, other highly effective options- she is unsure and will think about options.   H/o postterm SVD of 7lb9oz baby in 2005.   History OB History   Grav Para Term Preterm Abortions TAB SAB Ect Mult Living   2 1 1       1      Past Medical History  Diagnosis Date  . History of bronchitis 40yrs ago  . Migraine   . History of bleeding ulcers     told not to take ibuprofen   Past Surgical History  Procedure Laterality Date  . Tonsillectomy  03/10/2012    Procedure: TONSILLECTOMY;  Surgeon: Suzanna Obey, MD;  Location: [facility] OR;  Service: ENT;  Laterality: Bilateral;   Family History: family history includes Bipolar disorder in her mother; Cancer in her paternal grandfather; Diabetes in her father; Heart disease in her paternal grandmother; Other in her paternal grandfather. Social History:  reports that she has been smoking Cigarettes.  She has a 6.5 pack-year smoking history. She has never used smokeless tobacco. She reports that she does not drink alcohol or use illicit drugs.   Review of Systems  Constitutional: Negative.   HENT: Negative.   Eyes: Negative.   Respiratory: Negative.   Cardiovascular: Negative.   Gastrointestinal: Positive for abdominal pain (occ uc's).  Genitourinary: Negative.   Musculoskeletal: Negative.   Skin: Negative.   Neurological: Negative.   Endo/Heme/Allergies: Negative.   Psychiatric/Behavioral: Negative.     Dilation: 1 Effacement (%): 50 Station: -2;-3 Exam by:: Momen Ham, cnm Blood pressure  116/76, pulse 77, temperature 98.2 F (36.8 C), temperature source Oral, resp. rate 18, height 5\' 5"  (1.651 m), weight 102.059 kg (225 lb), last menstrual period 08/08/2012. Maternal Exam:  Uterine Assessment: Contraction strength is mild.  Contraction frequency is irregular.   Abdomen: Fetal presentation: vertex  Pelvis: adequate for delivery.   Cervix: Cervix evaluated by digital exam.     Fetal Exam Fetal Monitor Review: Mode: ultrasound.   Baseline rate: 120.  Variability: moderate (6-25 bpm).   Pattern: accelerations present and no decelerations.    Fetal State Assessment: Category I - tracings are normal.     Physical Exam  Constitutional: She is oriented to person, place, and time. She appears well-developed and well-nourished.  HENT:  Head: Normocephalic.  Neck: Normal range of motion.  Cardiovascular: Normal rate and regular rhythm.   Respiratory: Effort normal and breath sounds normal.  GI: Soft. There is no tenderness.  Gravid EFW ~ 7lbs  Genitourinary:  SVE: 1/50/-2 to -3, vtx, posterior Cervical foley bulb inserted and inflated w/ 60ml LR w/o difficulty  Clear fluid noted coming out of foley tubing at time of insertion, incidental AROM   Musculoskeletal: Normal range of motion.  Neurological: She is alert and oriented to person, place, and time. She has normal reflexes.  Skin: Skin is warm and dry.  Psychiatric: She has a normal mood and affect. Her behavior is normal. Judgment and thought content normal.     Prenatal labs: ABO, Rh: AB/POS/-- (  03/26 1421) Antibody: NEG (03/26 1421) Rubella: 1.97 (03/26 1421) RPR: NON REAC (08/07 1603)  HBsAg: NEGATIVE (03/26 1421)  HIV: NON REACTIVE (08/07 1603)  GBS: Negative (09/30 0000)   Assessment/Plan: A:   [redacted]w[redacted]d SIUP  G2P1001   IOL d/t postdates  Cat I FHR  GBS neg   P:  Admitted to BS  Cervical foley bulb in, w/ incidental AROM at time of insertion  IV pain meds prn, epidural prn active  labor  Anticipate NSVD   Marge Duncans 06/17/2013, 9:23 AM

## 2013-06-17 NOTE — Anesthesia Preprocedure Evaluation (Signed)
Anesthesia Evaluation  Patient identified by MRN, date of birth, ID band Patient awake    Reviewed: Allergy & Precautions, H&P , NPO status , Patient's Chart, lab work & pertinent test results  History of Anesthesia Complications Negative for: history of anesthetic complications  Airway Mallampati: I TM Distance: >3 FB Neck ROM: Full    Dental  (+) Teeth Intact and Dental Advisory Given   Pulmonary asthma , Current Smoker,  breath sounds clear to auscultation        Cardiovascular negative cardio ROS  Rhythm:Regular Rate:Normal     Neuro/Psych  Headaches, negative psych ROS   GI/Hepatic negative GI ROS, Neg liver ROS,   Endo/Other  negative endocrine ROS  Renal/GU negative Renal ROS     Musculoskeletal negative musculoskeletal ROS (+)   Abdominal (+) + obese,   Peds  Hematology negative hematology ROS (+)   Anesthesia Other Findings   Reproductive/Obstetrics (+) Pregnancy                           Anesthesia Physical  Anesthesia Plan  ASA: II  Anesthesia Plan: Epidural   Post-op Pain Management:    Induction:   Airway Management Planned:   Additional Equipment:   Intra-op Plan:   Post-operative Plan:   Informed Consent: I have reviewed the patients History and Physical, chart, labs and discussed the procedure including the risks, benefits and alternatives for the proposed anesthesia with the patient or authorized representative who has indicated his/her understanding and acceptance.     Plan Discussed with:   Anesthesia Plan Comments:         Anesthesia Quick Evaluation

## 2013-06-18 ENCOUNTER — Encounter (HOSPITAL_COMMUNITY): Payer: Self-pay

## 2013-06-18 MED ORDER — ONDANSETRON HCL 4 MG/2ML IJ SOLN: 4.0000 mg | INTRAMUSCULAR | Status: DC | PRN

## 2013-06-18 MED ORDER — ZOLPIDEM TARTRATE 5 MG PO TABS: 5.0000 mg | ORAL_TABLET | Freq: Every evening | ORAL | Status: DC | PRN

## 2013-06-18 MED ORDER — ACETAMINOPHEN 325 MG PO TABS
650.0000 mg | ORAL_TABLET | ORAL | Status: DC | PRN
  Administered 2013-06-19: 650 mg via ORAL
  Filled 2013-06-18: qty 2

## 2013-06-18 MED ORDER — PANTOPRAZOLE SODIUM 40 MG PO TBEC
40.0000 mg | DELAYED_RELEASE_TABLET | Freq: Every day | ORAL | Status: DC
Start: 2013-06-18 — End: 2013-06-19
  Administered 2013-06-18 – 2013-06-19 (×2): 40 mg via ORAL
  Filled 2013-06-18 (×2): qty 1

## 2013-06-18 MED ORDER — ONDANSETRON HCL 4 MG PO TABS: 4.0000 mg | ORAL_TABLET | ORAL | Status: DC | PRN

## 2013-06-18 MED ORDER — DIBUCAINE 1 % RE OINT: 1.0000 "application " | TOPICAL_OINTMENT | RECTAL | Status: DC | PRN

## 2013-06-18 MED ORDER — DIPHENHYDRAMINE HCL 25 MG PO CAPS: 25.0000 mg | ORAL_CAPSULE | Freq: Four times a day (QID) | ORAL | Status: DC | PRN

## 2013-06-18 MED ORDER — PRENATAL MULTIVITAMIN CH
1.0000 | ORAL_TABLET | Freq: Every day | ORAL | Status: DC
  Administered 2013-06-18: 1 via ORAL
  Filled 2013-06-18: qty 1

## 2013-06-18 MED ORDER — OXYCODONE-ACETAMINOPHEN 5-325 MG PO TABS
1.0000 | ORAL_TABLET | ORAL | Status: DC | PRN
  Administered 2013-06-18 – 2013-06-19 (×6): 1 via ORAL
  Filled 2013-06-18 (×6): qty 1

## 2013-06-18 MED ORDER — BENZOCAINE-MENTHOL 20-0.5 % EX AERO
1.0000 "application " | INHALATION_SPRAY | CUTANEOUS | Status: DC | PRN
  Filled 2013-06-18: qty 56

## 2013-06-18 MED ORDER — WITCH HAZEL-GLYCERIN EX PADS: 1.0000 "application " | MEDICATED_PAD | CUTANEOUS | Status: DC | PRN

## 2013-06-18 MED ORDER — LANOLIN HYDROUS EX OINT: TOPICAL_OINTMENT | CUTANEOUS | Status: DC | PRN

## 2013-06-18 MED ORDER — IBUPROFEN 600 MG PO TABS
600.0000 mg | ORAL_TABLET | Freq: Four times a day (QID) | ORAL | Status: DC
  Administered 2013-06-18 – 2013-06-19 (×5): 600 mg via ORAL
  Filled 2013-06-18 (×5): qty 1

## 2013-06-18 MED ORDER — SENNOSIDES-DOCUSATE SODIUM 8.6-50 MG PO TABS
2.0000 | ORAL_TABLET | ORAL | Status: DC
  Administered 2013-06-19: 2 via ORAL
  Filled 2013-06-18: qty 2

## 2013-06-18 MED ORDER — IBUPROFEN 600 MG PO TABS: 600.0000 mg | ORAL_TABLET | Freq: Four times a day (QID) | ORAL | Status: DC

## 2013-06-18 MED ORDER — SIMETHICONE 80 MG PO CHEW: 80.0000 mg | CHEWABLE_TABLET | ORAL | Status: DC | PRN

## 2013-06-18 NOTE — H&P (Signed)
Attestation of Attending Supervision of Advanced Practitioner (CNM/NP): Evaluation and management procedures were performed by the Advanced Practitioner under my supervision and collaboration.  I have reviewed the Advanced Practitioner's note and chart, and I agree with the management and plan.  Bentley Haralson 06/18/2013 3:59 PM

## 2013-06-18 NOTE — Anesthesia Postprocedure Evaluation (Signed)
  Anesthesia Post-op Note  Patient: Robyn Ortiz  Procedure(s) Performed: * No procedures listed *  Patient Location: Mother/Baby  Anesthesia Type:Epidural  Level of Consciousness: awake  Airway and Oxygen Therapy: Patient Spontanous Breathing  Post-op Pain: mild  Post-op Assessment: Patient's Cardiovascular Status Stable and Respiratory Function Stable  Post-op Vital Signs: stable  Complications: No apparent anesthesia complications

## 2013-06-18 NOTE — Progress Notes (Signed)
Post Partum Day 1 Subjective: no complaints, up ad lib, voiding and tolerating PO  Objective: Blood pressure 111/63, pulse 63, temperature 97.6 F (36.4 C), temperature source Oral, resp. rate 20, height 5\' 5"  (1.651 m), weight 102.059 kg (225 lb), last menstrual period 08/08/2012, SpO2 100.00%, unknown if currently breastfeeding.  Physical Exam:  General: alert, cooperative and no distress Lochia: appropriate Uterine Fundus: firm Incision: na DVT Evaluation: No evidence of DVT seen on physical exam. No cords or calf tenderness. No significant calf/ankle edema.   Recent Labs  06/17/13 0825  HGB 12.5  HCT 36.7    Assessment/Plan: Plan for discharge tomorrow, Breastfeeding, Lactation consult and Contraception nexplanon   LOS: 1 day   Nautica Hotz L 06/18/2013, 11:17 AM

## 2013-06-19 MED ORDER — IBUPROFEN 600 MG PO TABS: 600.0000 mg | ORAL_TABLET | Freq: Four times a day (QID) | ORAL | Status: DC

## 2013-06-19 MED ORDER — PNEUMOCOCCAL VAC POLYVALENT 25 MCG/0.5ML IJ INJ
0.5000 mL | INJECTION | INTRAMUSCULAR | Status: AC | PRN
  Administered 2013-06-19: 0.5 mL via INTRAMUSCULAR
  Filled 2013-06-19: qty 0.5

## 2013-06-19 MED ORDER — PANTOPRAZOLE SODIUM 40 MG PO TBEC: 40.0000 mg | DELAYED_RELEASE_TABLET | Freq: Every day | ORAL | Status: DC

## 2013-06-19 NOTE — Discharge Summary (Signed)
I examined pt and agree with documentation above and resident plan of care. MUHAMMAD,WALIDAH  

## 2013-06-19 NOTE — Progress Notes (Signed)
Ur chart review completed.  

## 2013-06-19 NOTE — Discharge Summary (Addendum)
Obstetric Discharge Summary Reason for Admission: induction of labor Prenatal Procedures: NST Intrapartum Procedures: spontaneous vaginal delivery Postpartum Procedures: none Complications-Operative and Postpartum: periurethral laceration (hemostatic, no repair necessary) Hemoglobin  Date Value Range Status  06/17/2013 12.5  12.0 - 15.0 g/dL Final     HCT  Date Value Range Status  06/17/2013 36.7  36.0 - 46.0 % Final    Physical Exam:  General: alert, cooperative and no distress Lochia: appropriate Uterine Fundus: firm Incision: n/a DVT Evaluation: No evidence of DVT seen on physical exam.  Discharge Diagnoses: Term Pregnancy-delivered  Discharge Information: Date: 06/19/2013 Activity: pelvic rest Diet: routine Medications: Ibuprofen Condition: stable Instructions: refer to practice specific booklet Discharge to: home Follow-up Information   [facility] up with [facility]. Schedule an appointment as soon as possible for a visit in 6 weeks. (for postpartum visit and Nexplanon insertion)    Specialty:  Obstetrics and Gynecology   Contact information:   [address] [place] [address] [phone]      Newborn Data: Live born female  Birth Weight: 5 lb 15.8 oz (2716 g) APGAR: 8, 9  Home with mother.  Hospital course: Pt was admitted for IOL due to postdates. She is GBS negative and did not require antibiotic therapy. Her induction was initiated with foley bulb and incidental AROM. Her labor was augmented with Pitocin. She progressed normally and had a NSVD. Her postpartum course was uncomplicated. She is breast feeding. She desires Nexplanon for postpartum contraception. She will follow up at [facility] for her postpartum care.   [place]art, Ryann 06/19/2013, 7:27 AM  I examined pt and agree with documentation above and resident plan of care. [facility]

## 2013-06-22 ENCOUNTER — Encounter (HOSPITAL_COMMUNITY): Payer: Self-pay | Admitting: *Deleted

## 2013-06-22 ENCOUNTER — Inpatient Hospital Stay (HOSPITAL_COMMUNITY)
Admission: AD | Admit: 2013-06-22 | Discharge: 2013-06-22 | Disposition: A | Discharge status: Home / Self Care | Payer: Medicaid Other | Source: Ambulatory Visit | Attending: Obstetrics and Gynecology | Admitting: Obstetrics and Gynecology

## 2013-06-22 ENCOUNTER — Ambulatory Visit (INDEPENDENT_AMBULATORY_CARE_PROVIDER_SITE_OTHER): Payer: Medicaid Other | Admitting: Obstetrics & Gynecology

## 2013-06-22 ENCOUNTER — Encounter: Payer: Self-pay | Admitting: Obstetrics & Gynecology

## 2013-06-22 VITALS — BP 143/93 | HR 44 | Ht 65.0 in | Wt 219.2 lb

## 2013-06-22 DIAGNOSIS — R030 Elevated blood-pressure reading, without diagnosis of hypertension: Secondary | ICD-10-CM | POA: Insufficient documentation

## 2013-06-22 DIAGNOSIS — R079 Chest pain, unspecified: Secondary | ICD-10-CM | POA: Insufficient documentation

## 2013-06-22 DIAGNOSIS — O99893 Other specified diseases and conditions complicating puerperium: Secondary | ICD-10-CM | POA: Insufficient documentation

## 2013-06-22 DIAGNOSIS — F3289 Other specified depressive episodes: Secondary | ICD-10-CM | POA: Insufficient documentation

## 2013-06-22 DIAGNOSIS — O99345 Other mental disorders complicating the puerperium: Secondary | ICD-10-CM

## 2013-06-22 DIAGNOSIS — R001 Bradycardia, unspecified: Secondary | ICD-10-CM

## 2013-06-22 DIAGNOSIS — R109 Unspecified abdominal pain: Secondary | ICD-10-CM | POA: Insufficient documentation

## 2013-06-22 DIAGNOSIS — F329 Major depressive disorder, single episode, unspecified: Secondary | ICD-10-CM | POA: Insufficient documentation

## 2013-06-22 DIAGNOSIS — R51 Headache: Secondary | ICD-10-CM | POA: Insufficient documentation

## 2013-06-22 DIAGNOSIS — F53 Postpartum depression: Secondary | ICD-10-CM

## 2013-06-22 LAB — COMPREHENSIVE METABOLIC PANEL
ALT: 23 U/L (ref 0–35)
Albumin: 3 g/dL — ABNORMAL LOW (ref 3.5–5.2)
Alkaline Phosphatase: 78 U/L (ref 39–117)
Chloride: 105 mEq/L (ref 96–112)
Glucose, Bld: 86 mg/dL (ref 70–99)
Potassium: 3.7 mEq/L (ref 3.5–5.1)
Sodium: 141 mEq/L (ref 135–145)
Total Bilirubin: 0.3 mg/dL (ref 0.3–1.2)
Total Protein: 6.4 g/dL (ref 6.0–8.3)

## 2013-06-22 LAB — URINALYSIS, ROUTINE W REFLEX MICROSCOPIC
Glucose, UA: NEGATIVE mg/dL
Ketones, ur: NEGATIVE mg/dL
Nitrite: NEGATIVE
Protein, ur: NEGATIVE mg/dL
Specific Gravity, Urine: 1.015 (ref 1.005–1.030)
pH: 6 (ref 5.0–8.0)

## 2013-06-22 LAB — CBC
Hemoglobin: 11.7 g/dL — ABNORMAL LOW (ref 12.0–15.0)
MCHC: 34.8 g/dL (ref 30.0–36.0)
MCV: 88.4 fL (ref 78.0–100.0)
RBC: 3.8 MIL/uL — ABNORMAL LOW (ref 3.87–5.11)
RDW: 12.7 % (ref 11.5–15.5)
WBC: 5.9 10*3/uL (ref 4.0–10.5)

## 2013-06-22 LAB — URINE MICROSCOPIC-ADD ON

## 2013-06-22 LAB — PROTEIN / CREATININE RATIO, URINE
Creatinine, Urine: 110.77 mg/dL
Protein Creatinine Ratio: 0.07 (ref 0.00–0.15)

## 2013-06-22 MED ORDER — FLUOXETINE HCL 20 MG PO TABS: 20.0000 mg | ORAL_TABLET | Freq: Every day | ORAL | Status: DC

## 2013-06-22 NOTE — Lactation Note (Signed)
Adult Lactation Consultation Outpatient Visit Note  Patient Name: Robyn Ortiz Date of Birth: 03-09-1986 Gestational Age at Delivery: Unknown Reason for visit in MAU - breast are sore and have been full and firm. Mom denies sore nipples now ( although they had been sore with latching 2 days ago )  Last time the breast were stimulated was last evening with a hand pump and mom pumped off 30ml , but the hand pump Was making the nipples sore , so I quit.   Breastfeeding History: per mom in the hospital was latching with difficulty  and when I went home was having difficulty to point  I have been supplementing with a bottle for the last 3 days up to 60 ml of formula. Per mom baby's weight has increased to 6-5 oz since D/C .    Supplementing / Method: Pumping:  Type of Pump: Manual pump    Frequency: since last night has not used hand pump   Volume:  30 ml had the most   Comments:per mom the #24 NS was making me sore     Consultation Evaluation:Baby was not with mom , both breast boarder line engorged - noted  Both breast inner aspects had large pink areas  The size of orange. Mom denies tenderness, or elevated temp , or any flu like s/s. Area warm to touch.  LC had mom ice for 20 mins both breast and then set up up a DEBP pump , noted the #24 Flange got tight at the base , switched to #27 flange.  Per mom the #24 felt ok , but LC felt due to tightness, and skin appearing pinkish red the switch needed to be made. After pumping 20 mins ,  with breast massage, hand express, breast compressions , 6 ml EBM yield. Per mom breast much more comfortable.  LC offered a [facility] loaner DEBP , per mom unable to come up with the money.  During consult mom called [facility] and left a message for a DEBP by tomorrow. Stressed to mom if [facility] doesn't  return call by tomorrow am , to call them in am .  F/U appointment - for Monday November 10 th @9  am . Mom knows to call if she needs top change  apt.  Lactation Plan of Care - Mom - Rest , plenty Fluids , esp, H20 , nutritious snacks and meals                                         - Breast feeding goals - Relieve engorgement , establish and protect milk supply. Breast need to be stimulated at least 8 X's per 24 hours .  Steps for latching - breast massage , hand express, prepump if to full , reverse pressure if needed , attempt latch , if challenge ,  Use a Nipple shield ( LC fit mom with #20 NS and #24 NS in MAU - #24 NS fit better ) , instill EBM or formula in the top of the NS and latch , listen for swallows. Feed for 15- 20 mins , offer 2nd breast , and post pump for 10 mins , save milk. Intil consult plan on supplementing baby with EBM or formula ( 30 ml or more )  Meet the nutritional needs of the baby with feeding cues.  Of the baby doesn't latch - pump both  breast for 15-20 mins , save milk . ( tonight until [facility] DEBP is obtained - mom was shown how to use the DEBP kit manually with family helping )    Follow-Up-Monday November 10 th at 9am , mom knows to bring baby, expressed milk , DEBP kit , and NS's .       Kathrin Greathouse 06/22/2013, 5:07 PM

## 2013-06-22 NOTE — Progress Notes (Signed)
CSW met with pt in her M[facility] Admission Hospital room (#5), to assess her current emotional state.  Pt is 4 day PPD & reports that she is feeling "down, overwhelmed & depressed."  Pt's symptoms started 1 day after discharge.  She told CSW that she cries without an explanation & easily upset.  She denies any SI/HI.  Pt also denies loss of interest in the baby, as she enjoys caring for her newborn.  She has a 26 year old daughter & doesn't remember feeling this way after delivery.  Pt lives with the FOB & his mother, who she describes as supportive.  She also has a best friend (who is with her today), who also describes as very supportive.  CSW validated pt's feelings & discussed the difference between baby blues & PP depression.  RN advised CSW that OBGYN plans to start pt on an anti-depressant (Prozac).  Pt plans to get the prescription filled today upon discharge.  She denies other social stressors that may be contributing to her symptoms & not interested in counseling referral.  CSW encouraged pt to start taking the medication today but allow at least 2 weeks before she expects a change in mood.  Pt agrees to call this CSW if her moods do not improve or for additional resources, if needed.  Pt thanked CSW for consult.

## 2013-06-22 NOTE — MAU Provider Note (Signed)
History     CSN: [id number]  Arrival date and time: 06/22/13 1240   None     Chief Complaint  Patient presents with  . Chest Pain  . Headache  . Abdominal Pain  . blood pressure    HPI  [name] is a 27 y.o G2P2 PPD#4 for SVD who presents after postpartum clinic visit today with Dr. Macon Large for elevated BPs, chest pressure, problems with lactation and increased feelings of overwhelming sadness. Pt noted to have HP of 143/93 in office. Attested to a headache there but currently denying headache or blurry vision. Does admit to constant increased substernal pressure for the past 2 days worsening with deep inspiration and leaning forward occasionally causing patient to become SOB. Denies radiation to the carotids or down left arm or through the scapula. No alleviating factors. Also attesting to some epigastric discomfort and suprapubic pressure. Unrelated to food intake. Patient has taken minimal PO since discharge. Denies beltching or acid taste in mouth.  Has had two brown formed soft bowel movement, no straining.  Additionally patient with overwhelming feelings of sadness. Started approx 2 days after delivery has gotten worse since that time. Does not have hx of depression, never on medications. However mother with hx of bipolar. Pt cannot describe feelings of sadness. Feels that she "doesn't understand." At home with mother, other child and FOB. Feels supported at home.  OB History   Grav Para Term Preterm Abortions TAB SAB Ect Mult Living   2 2 2       2       Past Medical History  Diagnosis Date  . History of bronchitis 52yrs ago  . Migraine   . History of bleeding ulcers     told not to take ibuprofen    Past Surgical History  Procedure Laterality Date  . Tonsillectomy  03/10/2012    Procedure: TONSILLECTOMY;  Surgeon: Suzanna Obey, MD;  Location: [facility] OR;  Service: ENT;  Laterality: Bilateral;    Family History  Problem Relation Age of Onset  . Diabetes Father   . Heart  disease Paternal Grandmother   . Cancer Paternal Grandfather     intestional cancer  . Other Paternal Grandfather     deteriorating disc  . Bipolar disorder Mother     History  Substance Use Topics  . Smoking status: Current Every Day Smoker -- 0.50 packs/day for 13 years    Types: Cigarettes  . Smokeless tobacco: Never Used  . Alcohol Use: No    Allergies:  Allergies  Allergen Reactions  . Codeine Other (See Comments)    Pt. States she had a seizure  . Ibuprofen     Patient was diagnosed with bleeding ulcers and advised not to take this medication.    Prescriptions prior to admission  Medication Sig Dispense Refill  . acetaminophen (TYLENOL) 500 MG tablet Take 1,000 mg by mouth every 6 (six) hours as needed for pain.      Marland Kitchen ibuprofen (ADVIL,MOTRIN) 600 MG tablet Take 1 tablet (600 mg total) by mouth every 6 (six) hours.  30 tablet  0  . pantoprazole (PROTONIX) 40 MG tablet Take 40 mg by mouth at bedtime.      . Prenatal Vit-Fe Fumarate-FA (PRENATAL MULTIVITAMIN) TABS Take 1 tablet by mouth every morning.      . rizatriptan (MAXALT) 10 MG tablet Take 1 tablet (10 mg total) by mouth as needed for migraine. May repeat in 2 hours if needed  12 tablet  11  .  albuterol (PROVENTIL HFA;VENTOLIN HFA) 108 (90 BASE) MCG/ACT inhaler Inhale 2 puffs into the lungs every 6 (six) hours as needed for wheezing.        Review of Systems  Constitutional: Negative for fever and chills.  HENT: Negative for congestion, ear pain and sore throat.   Eyes: Negative for blurred vision and double vision.  Respiratory: Positive for shortness of breath. Negative for cough, hemoptysis, sputum production and wheezing.   Cardiovascular: Positive for chest pain and leg swelling. Negative for palpitations and orthopnea.  Gastrointestinal: Positive for abdominal pain. Negative for heartburn, nausea, vomiting, diarrhea and constipation.  Genitourinary: Negative for dysuria, urgency and frequency.   Musculoskeletal: Negative for myalgias.  Skin: Negative for rash.  Neurological: Positive for headaches. Negative for dizziness, seizures, loss of consciousness and weakness.  Endo/Heme/Allergies: Does not bruise/bleed easily.  Psychiatric/Behavioral: Positive for depression. Negative for suicidal ideas, hallucinations, memory loss and substance abuse. The patient is not nervous/anxious.    Physical Exam   Blood pressure 135/92, pulse 54, temperature 97.9 F (36.6 C), temperature source Oral, resp. rate 18, height 5\' 5"  (1.651 m), weight 100.064 kg (220 lb 9.6 oz), SpO2 96.00%.  Filed Vitals:   06/22/13 1345 06/22/13 1359 06/22/13 1430 06/22/13 1445  BP:  124/77 146/78 131/87  Pulse: 63 52 53 59  Temp:      TempSrc:      Resp:      Height:      Weight:      SpO2:  96% 97%      Physical Exam  Constitutional: She is oriented to person, place, and time. She appears well-developed and well-nourished. No distress.  HENT:  Head: Normocephalic and atraumatic.  Eyes: EOM are normal.  Neck: Normal range of motion. Neck supple.  Cardiovascular: Normal rate, regular rhythm, normal heart sounds and intact distal pulses.  Exam reveals no gallop and no friction rub.   No murmur heard. Respiratory: Effort normal and breath sounds normal. No respiratory distress. She has no wheezes. She has no rales. She exhibits tenderness.  Chest pain reproducible   GI: Soft. Bowel sounds are normal. She exhibits no distension and no mass. There is tenderness. There is no rebound and no guarding.  Tender in epigastric region as well as suprapubic area bilaterally  Musculoskeletal: Normal range of motion. She exhibits edema.  Neurological: She is alert and oriented to person, place, and time. She has normal reflexes. No cranial nerve deficit.  Skin: Skin is warm and dry. No erythema.  Psychiatric: She has a normal mood and affect. Her behavior is normal.    MAU Course  Procedures  MDM CBC with  diff CMET Pr/Cr ratio EKG Social work consult Lactation consult  CBC    Component Value Date/Time   WBC 5.9 06/22/2013 1334   RBC 3.80* 06/22/2013 1334   HGB 11.7* 06/22/2013 1334   HCT 33.6* 06/22/2013 1334   PLT 199 06/22/2013 1334   MCV 88.4 06/22/2013 1334   MCH 30.8 06/22/2013 1334   MCHC 34.8 06/22/2013 1334   RDW 12.7 06/22/2013 1334   LYMPHSABS 1.5 03/23/2013 1603   MONOABS 0.6 03/23/2013 1603   EOSABS 0.2 03/23/2013 1603   BASOSABS 0.0 03/23/2013 1603    CMP     Component Value Date/Time   NA 141 06/22/2013 1334   K 3.7 06/22/2013 1334   CL 105 06/22/2013 1334   CO2 25 06/22/2013 1334   GLUCOSE 86 06/22/2013 1334   BUN 16 06/22/2013 1334  CREATININE 0.64 06/22/2013 1334   CALCIUM 8.8 06/22/2013 1334   PROT 6.4 06/22/2013 1334   ALBUMIN 3.0* 06/22/2013 1334   AST 24 06/22/2013 1334   ALT 23 06/22/2013 1334   ALKPHOS 78 06/22/2013 1334   BILITOT 0.3 06/22/2013 1334   GFRNONAA >90 06/22/2013 1334   GFRAA >90 06/22/2013 1334    Pro:cr 0.07  Assessment and Plan  [name] is a 27 y.o G2P2 who presents for elevated BPs at clinic, chest pressure, feelings of depression  1. PIH eval- Ast/alt 24/23, platelets 199, hgb 11.7, pr/cr ratio 0.07. BPs systolic 130s-120s/70s-90s. Attesting to chest pressure but reproducible on palpation to chest wall and position of breast -no further sx/signs of pre-e -continue to monitor for elevated bps in postpartum period -no indication for intervention at this time  2. Chest pain/Pressure - engorged breasts vs costochondritis vs anxiety vs GER. Strongly doubt pericarditis vs pleuritis vs angina given neg EKG; improved with positional changes, reproducible on exam, worsening when leaning forward, and weight placed on breasts. Pulse here 50s.Cardiac origin much less likely. EKG with sinus brady -encouraged pt to try positional changes  -should continue to breast feed to allow let down  -improve PO hydration  3. Depression- social work consult, postpartum  blues vs PP depression. Pt with decreased sleep, feelings of sadness, difficult to verbalize. Encouraged to verbalize her thoughts. Expect to improve in the next several weeks. No SI/HI or self mutilating thoughts.  -rx for 20mg  prozac qday -will f/up at [facility] in next 1-2 weeks for reassessment  4. Difficulty with lactation- lac consult here -encouraged to follow up with them as needed  Anselm Lis 06/22/2013, 1:57 PM   I spoke with and examined patient and have made edits with resident's note and plan of care. Agree with above. Tawana Scale, MD OB Fellow 06/22/2013 3:46 PM

## 2013-06-22 NOTE — MAU Note (Signed)
Patient presents to MAU being sent from [facility] office for evaluation of high blood pressures. Pt states has headache currently; denies blurry vision or floaters. Last took tylenol at 11am today. Reports upper and lower abdominal pain that is dull and sharp at times. Reports chest pressure that is dull and increases with movement. Denies radiating of chest pain. Sats 99% RA. Patient also states "I am feeling down, depressed, and overwhelmed".

## 2013-06-26 ENCOUNTER — Ambulatory Visit (HOSPITAL_COMMUNITY): Admit: 2013-06-26 | Discharge: 2013-06-26 | Disposition: A | Discharge status: Home / Self Care | Payer: Medicaid Other

## 2013-06-26 NOTE — Discharge Summary (Signed)
Attestation of Attending Supervision of Advanced Practitioner (CNM/NP): Evaluation and management procedures were performed by the Advanced Practitioner under my supervision and collaboration.  I have reviewed the Advanced Practitioner's note and chart, and I agree with the management and plan.  HARRAWAY-SMITH, Daila Elbert 1:11 PM     

## 2013-06-27 DIAGNOSIS — Z8659 Personal history of other mental and behavioral disorders: Secondary | ICD-10-CM | POA: Insufficient documentation

## 2013-06-27 NOTE — Patient Instructions (Signed)
Return to clinic for any scheduled appointments or for any gynecologic concerns as needed.   

## 2013-06-27 NOTE — MAU Provider Note (Signed)
Attestation of Attending Supervision of Advanced Practitioner (CNM/NP): Evaluation and management procedures were performed by the Advanced Practitioner under my supervision and collaboration.  I have reviewed the Advanced Practitioner's note and chart, and I agree with the management and plan.  Liany Mumpower 06/27/2013 4:22 PM   

## 2013-06-27 NOTE — Progress Notes (Signed)
OB/GYN CLINIC ENCOUNTER NOTE  History:  27 y.o. [id number] s/p SVD on 06/18/13 here today for evaluation of postpartum depression.  Patient denies SI/HI but feels very overwhelmed with everything. She is very tearful and is requesting help.  Also reports her baby was not latching on correctly.  Also reports ongoing headache since discharge form hospital.   The following portions of the patient's history were reviewed and updated as appropriate: allergies, current medications, past family history, past medical history, past social history, past surgical history and problem list.  Review of Systems:  Pertinent items are noted in HPI.  Objective:  Physical Exam BP 143/93  Pulse 44  Ht 5\' 5"  (1.651 m)  Wt 219 lb 3.2 oz (99.428 kg)  BMI 36.48 kg/m2  Breastfeeding? Yes Gen: Very sad affect Abd: Soft, nontender and nondistended Pelvic: Deferred  Assessment & Plan:  Given her depression, headache and elevated BP (no BP problems during pregnancy), will send to MAU for further evaluation. She will be evaluated for possible postpartum preeclampsia.  She will also be evaluated for depression and have a Child psychotherapist see her in MAU; also get help from lactation consultation.  MAU and [facility] staff on call notified.  Patient will follow up for regular postpartum check or earlier as needed.  Jaynie Collins, MD, FACOG Attending Obstetrician & Gynecologist Faculty Practice, [facility]

## 2013-07-18 ENCOUNTER — Ambulatory Visit (INDEPENDENT_AMBULATORY_CARE_PROVIDER_SITE_OTHER): Payer: Medicaid Other | Admitting: Family Medicine

## 2013-07-18 ENCOUNTER — Encounter: Payer: Self-pay | Admitting: Family Medicine

## 2013-07-18 NOTE — Patient Instructions (Signed)
Etonogestrel implant What is this medicine? ETONOGESTREL (et oh noe JES trel) is a contraceptive (birth control) device. It is used to prevent pregnancy. It can be used for up to 3 years. This medicine may be used for other purposes; ask your health care provider or pharmacist if you have questions. COMMON BRAND NAME(S): Implanon, Nexplanon  What should I tell my health care provider before I take this medicine? They need to know if you have any of these conditions: -abnormal vaginal bleeding -blood vessel disease or blood clots -cancer of the breast, cervix, or liver -depression -diabetes -gallbladder disease -headaches -heart disease or recent heart attack -high blood pressure -high cholesterol -kidney disease -liver disease -renal disease -seizures -tobacco smoker -an unusual or allergic reaction to etonogestrel, other hormones, anesthetics or antiseptics, medicines, foods, dyes, or preservatives -pregnant or trying to get pregnant -breast-feeding How should I use this medicine? This device is inserted just under the skin on the inner side of your upper arm by a health care professional. Talk to your pediatrician regarding the use of this medicine in children. Special care may be needed. Overdosage: If you think you've taken too much of this medicine contact a poison control center or emergency room at once. Overdosage: If you think you have taken too much of this medicine contact a poison control center or emergency room at once. NOTE: This medicine is only for you. Do not share this medicine with others. What if I miss a dose? This does not apply. What may interact with this medicine? Do not take this medicine with any of the following medications: -amprenavir -bosentan -fosamprenavir This medicine may also interact with the following medications: -barbiturate medicines for inducing sleep or treating seizures -certain medicines for fungal infections like ketoconazole and  itraconazole -griseofulvin -medicines to treat seizures like carbamazepine, felbamate, oxcarbazepine, phenytoin, topiramate -modafinil -phenylbutazone -rifampin -some medicines to treat HIV infection like atazanavir, indinavir, lopinavir, nelfinavir, tipranavir, ritonavir -St. John's wort This list may not describe all possible interactions. Give your health care provider a list of all the medicines, herbs, non-prescription drugs, or dietary supplements you use. Also tell them if you smoke, drink alcohol, or use illegal drugs. Some items may interact with your medicine. What should I watch for while using this medicine? This product does not protect you against HIV infection (AIDS) or other sexually transmitted diseases. You should be able to feel the implant by pressing your fingertips over the skin where it was inserted. Tell your doctor if you cannot feel the implant. What side effects may I notice from receiving this medicine? Side effects that you should report to your doctor or health care professional as soon as possible: -allergic reactions like skin rash, itching or hives, swelling of the face, lips, or tongue -breast lumps -changes in vision -confusion, trouble speaking or understanding -dark urine -depressed mood -general ill feeling or flu-like symptoms -light-colored stools -loss of appetite, nausea -right upper belly pain -severe headaches -severe pain, swelling, or tenderness in the abdomen -shortness of breath, chest pain, swelling in a leg -signs of pregnancy -sudden numbness or weakness of the face, arm or leg -trouble walking, dizziness, loss of balance or coordination -unusual vaginal bleeding, discharge -unusually weak or tired -yellowing of the eyes or skin Side effects that usually do not require medical attention (Report these to your doctor or health care professional if they continue or are bothersome.): -acne -breast pain -changes in  weight -cough -fever or chills -headache -irregular menstrual bleeding -itching, burning,   and vaginal discharge -pain or difficulty passing urine -sore throat This list may not describe all possible side effects. Call your doctor for medical advice about side effects. You may report side effects to FDA at 1-800-FDA-1088. Where should I keep my medicine? This drug is given in a hospital or clinic and will not be stored at home. NOTE: This sheet is a summary. It may not cover all possible information. If you have questions about this medicine, talk to your doctor, pharmacist, or health care provider.  2014, Elsevier/Gold Standard. (2012-02-08 15:37:45)  

## 2013-07-18 NOTE — Progress Notes (Signed)
  Subjective:     Robyn Ortiz is a 27 y.o. female who presents for a postpartum visit. She is 6 weeks postpartum following a spontaneous vaginal delivery. I have fully reviewed the prenatal and intrapartum course. The delivery was at 41 gestational weeks. Outcome: spontaneous vaginal delivery. Anesthesia: epidural. Postpartum course has been unremarkable. Baby's course has been unremarkable. Baby is feeding by bottle - Gerber smooth. Bleeding staining only. Bowel function is normal. Bladder function is normal. Patient is sexually active. Contraception method is condoms. Postpartum depression screening: negative.  The following portions of the patient's history were reviewed and updated as appropriate: allergies, current medications, past family history, past medical history, past social history, past surgical history and problem list.  Review of Systems A comprehensive review of systems was negative.   Objective:    BP 101/74  Pulse 77  Ht 5\' 5"  (1.651 m)  Wt 209 lb (94.802 kg)  BMI 34.78 kg/m2  Breastfeeding? No  General:  alert, cooperative and appears stated age  Abdomen: soft, non-tender; bowel sounds normal; no masses,  no organomegaly   Vulva:  normal  Vagina: normal vagina  Cervix:  multiparous appearance and no lesions  Corpus: normal size, contour, position, consistency, mobility, non-tender  Adnexa:  no mass, fullness, tenderness        Assessment:     Normal postpartum exam. Pap smear not done at today's visit.   Plan:    1. Contraception: condoms and Nexplanon 2 . Last pap 4/14--none needed x 3 years. 3.  Follow up in: 1 week for Nexplanon insertion or as needed.  4.  No unprotected intercourse until Nexplanon inserted.

## 2013-07-22 ENCOUNTER — Inpatient Hospital Stay (HOSPITAL_COMMUNITY): Payer: Medicaid Other

## 2013-07-22 ENCOUNTER — Inpatient Hospital Stay (HOSPITAL_COMMUNITY)
Admission: AD | Admit: 2013-07-22 | Discharge: 2013-07-22 | Disposition: A | Discharge status: Home / Self Care | Payer: Medicaid Other | Source: Ambulatory Visit | Attending: Obstetrics & Gynecology | Admitting: Obstetrics & Gynecology

## 2013-07-22 ENCOUNTER — Encounter (HOSPITAL_COMMUNITY): Payer: Self-pay | Admitting: *Deleted

## 2013-07-22 DIAGNOSIS — N938 Other specified abnormal uterine and vaginal bleeding: Secondary | ICD-10-CM

## 2013-07-22 DIAGNOSIS — J069 Acute upper respiratory infection, unspecified: Secondary | ICD-10-CM

## 2013-07-22 DIAGNOSIS — N949 Unspecified condition associated with female genital organs and menstrual cycle: Secondary | ICD-10-CM | POA: Insufficient documentation

## 2013-07-22 LAB — CBC
Hemoglobin: 13.9 g/dL (ref 12.0–15.0)
MCH: 30.8 pg (ref 26.0–34.0)
MCV: 89.1 fL (ref 78.0–100.0)
RBC: 4.51 MIL/uL (ref 3.87–5.11)

## 2013-07-22 LAB — URINALYSIS, ROUTINE W REFLEX MICROSCOPIC
Bilirubin Urine: NEGATIVE
Ketones, ur: NEGATIVE mg/dL
Nitrite: NEGATIVE
Protein, ur: NEGATIVE mg/dL
Urobilinogen, UA: 0.2 mg/dL (ref 0.0–1.0)
pH: 5.5 (ref 5.0–8.0)

## 2013-07-22 LAB — URINE MICROSCOPIC-ADD ON

## 2013-07-22 MED ORDER — DM-GUAIFENESIN ER 30-600 MG PO TB12: 1.0000 | ORAL_TABLET | Freq: Two times a day (BID) | ORAL | Status: DC

## 2013-07-22 MED ORDER — NORGESTIMATE-ETH ESTRADIOL 0.25-35 MG-MCG PO TABS: ORAL_TABLET | ORAL | Status: DC

## 2013-07-22 MED ORDER — DM-GUAIFENESIN ER 30-600 MG PO TB12
1.0000 | ORAL_TABLET | Freq: Two times a day (BID) | ORAL | Status: DC
  Administered 2013-07-22: 1 via ORAL
  Filled 2013-07-22 (×3): qty 1

## 2013-07-22 MED ORDER — KETOROLAC TROMETHAMINE 60 MG/2ML IM SOLN
60.0000 mg | Freq: Once | INTRAMUSCULAR | Status: AC
  Administered 2013-07-22: 60 mg via INTRAMUSCULAR
  Filled 2013-07-22: qty 2

## 2013-07-22 NOTE — MAU Provider Note (Signed)
History     CSN: [id number]  Arrival date and time: 07/22/13 1115   None     Chief Complaint  Patient presents with  . Vaginal Bleeding   HPI Pt is 5 weeks post partum born [birth year].  Pt breast fed for about 2 weeks, now is bottle feeding..  Pt had bleeding PP which stopped for 2 days 1 week ago and restarted 2 days later.  Pt is having cramps.   Pt started bleeding as normal and then yesterday started bleeding heavier and last night soaked through her clothes. Pt is having pain in lower stomach- has taken Ibuprofen- (pt has hx of bleeding ulcers).  Pt had IC last week. Pt is scheduled for Nexplanon implant  Pt also c/o of cough and congestion- no fever or sore throat; pt has hx of tonsillectomy  RN note: Patient presents to MAU with c/o vaginal bleeding since last night that has been heavy; she states going through a pad every to 1 hour with a few small clots. Vaginal Delivery on November 1st. Denies dizziness at this time. Also reports having a cough for the past week and vomiting for the past 2 days   Past Medical History  Diagnosis Date  . History of bronchitis 35yrs ago  . Migraine   . History of bleeding ulcers     told not to take ibuprofen    Past Surgical History  Procedure Laterality Date  . Tonsillectomy  03/10/2012    Procedure: TONSILLECTOMY;  Surgeon: Suzanna Obey, MD;  Location: [facility] OR;  Service: ENT;  Laterality: Bilateral;    Family History  Problem Relation Age of Onset  . Diabetes Father   . Heart disease Paternal Grandmother   . Cancer Paternal Grandfather     intestional cancer  . Other Paternal Grandfather     deteriorating disc  . Bipolar disorder Mother     History  Substance Use Topics  . Smoking status: Current Every Day Smoker -- 0.50 packs/day for 13 years    Types: Cigarettes  . Smokeless tobacco: Never Used  . Alcohol Use: No    Allergies:  Allergies  Allergen Reactions  . Codeine Other (See Comments)    Pt. States she had a  seizure  . Ibuprofen     Patient was diagnosed with bleeding ulcers and advised not to take this medication.    Prescriptions prior to admission  Medication Sig Dispense Refill  . acetaminophen (TYLENOL) 500 MG tablet Take 1,000 mg by mouth every 6 (six) hours as needed for pain.      Marland Kitchen FLUoxetine (PROZAC) 20 MG tablet Take 1 tablet (20 mg total) by mouth daily.  60 tablet  0  . Prenatal Vit-Fe Fumarate-FA (PRENATAL MULTIVITAMIN) TABS Take 1 tablet by mouth every morning.      Marland Kitchen albuterol (PROVENTIL HFA;VENTOLIN HFA) 108 (90 BASE) MCG/ACT inhaler Inhale 2 puffs into the lungs every 6 (six) hours as needed for wheezing.        Review of Systems  Constitutional: Negative for fever and chills.  Respiratory: Positive for cough.   Gastrointestinal: Positive for abdominal pain. Negative for nausea, vomiting, diarrhea and constipation.  Genitourinary: Negative for dysuria and urgency.   Physical Exam   Blood pressure 127/83, pulse 69, temperature 98.1 F (36.7 C), temperature source Oral, resp. rate 18, height 5\' 5"  (1.651 m), weight 95.528 kg (210 lb 9.6 oz), SpO2 99.00%.  Physical Exam  Vitals reviewed. Constitutional: She is oriented to person, place,  and time. She appears well-developed and well-nourished. No distress.  HENT:  Head: Normocephalic.  Eyes: Pupils are equal, round, and reactive to light.  Neck: Normal range of motion. Neck supple.  Cardiovascular: Normal rate.   Respiratory: Effort normal and breath sounds normal. No respiratory distress.  GI: Soft. She exhibits no distension. There is no tenderness. There is no rebound and no guarding.  Genitourinary:  Mod amount of blood in vault; cervix clean, bimanual mildly tender  Musculoskeletal: Normal range of motion.  Neurological: She is alert and oriented to person, place, and time.  Skin: Skin is warm and dry.  Psychiatric: She has a normal mood and affect.    MAU Course  Procedures Results for orders placed during  the hospital encounter of 07/22/13 (from the past 24 hour(s))  URINALYSIS, ROUTINE W REFLEX MICROSCOPIC     Status: Abnormal   Collection Time    07/22/13 11:30 AM      Result Value Range   Color, Urine RED (*) YELLOW   APPearance HAZY (*) CLEAR   Specific Gravity, Urine 1.020  1.005 - 1.030   pH 5.5  5.0 - 8.0   Glucose, UA NEGATIVE  NEGATIVE mg/dL   Hgb urine dipstick LARGE (*) NEGATIVE   Bilirubin Urine NEGATIVE  NEGATIVE   Ketones, ur NEGATIVE  NEGATIVE mg/dL   Protein, ur NEGATIVE  NEGATIVE mg/dL   Urobilinogen, UA 0.2  0.0 - 1.0 mg/dL   Nitrite NEGATIVE  NEGATIVE   Leukocytes, UA NEGATIVE  NEGATIVE  URINE MICROSCOPIC-ADD ON     Status: None   Collection Time    07/22/13 11:30 AM      Result Value Range   Squamous Epithelial / LPF RARE  RARE   WBC, UA 0-2  <3 WBC/hpf   RBC / HPF TOO NUMEROUS TO COUNT  <3 RBC/hpf   Bacteria, UA RARE  RARE  CBC     Status: None   Collection Time    07/22/13 12:16 PM      Result Value Range   WBC 7.3  4.0 - 10.5 K/uL   RBC 4.51  3.87 - 5.11 MIL/uL   Hemoglobin 13.9  12.0 - 15.0 g/dL   HCT 16.1  09.6 - 04.5 %   MCV 89.1  78.0 - 100.0 fL   MCH 30.8  26.0 - 34.0 pg   MCHC 34.6  30.0 - 36.0 g/dL   RDW 40.9  81.1 - 91.4 %   Platelets 234  150 - 400 K/uL   US Transvaginal Non-ob  07/22/2013   CLINICAL DATA:  Vaginal bleeding  EXAM: TRANSABDOMINAL AND TRANSVAGINAL ULTRASOUND OF PELVIS  TECHNIQUE: Both transabdominal and transvaginal ultrasound examinations of the pelvis were performed. Transabdominal technique was performed for global imaging of the pelvis including uterus, ovaries, adnexal regions, and pelvic cul-de-sac. It was necessary to proceed with endovaginal exam following the transabdominal exam to visualize the endometrium.  COMPARISON:  02/21/2013  FINDINGS: Uterus  Measurements: 9.6 x 5.4 x 6.2 cm. No fibroids or other mass visualized.  Endometrium  Thickness: 3.5 mm.  No focal abnormality visualized.  Right ovary  Measurements: 3.1 x  1.4 x 2.1 cm. Normal appearance/no adnexal mass.  Left ovary  Measurements: 2.2 x 1.2 x 1.2 cm. Normal appearance/no adnexal mass.  Other findings  No free fluid.  IMPRESSION: 1. No findings to explain vaginal bleeding. 2.   Electronically Signed   By: Signa Kell M.D.   On: 07/22/2013 13:21   US Pelvis  Complete  07/22/2013   CLINICAL DATA:  Vaginal bleeding  EXAM: TRANSABDOMINAL AND TRANSVAGINAL ULTRASOUND OF PELVIS  TECHNIQUE: Both transabdominal and transvaginal ultrasound examinations of the pelvis were performed. Transabdominal technique was performed for global imaging of the pelvis including uterus, ovaries, adnexal regions, and pelvic cul-de-sac. It was necessary to proceed with endovaginal exam following the transabdominal exam to visualize the endometrium.  COMPARISON:  02/21/2013  FINDINGS: Uterus  Measurements: 9.6 x 5.4 x 6.2 cm. No fibroids or other mass visualized.  Endometrium  Thickness: 3.5 mm.  No focal abnormality visualized.  Right ovary  Measurements: 3.1 x 1.4 x 2.1 cm. Normal appearance/no adnexal mass.  Left ovary  Measurements: 2.2 x 1.2 x 1.2 cm. Normal appearance/no adnexal mass.  Other findings  No free fluid.  IMPRESSION: 1. No findings to explain vaginal bleeding. 2.   Electronically Signed   By: Signa Kell M.D.   On: 07/22/2013 13:21  multiple prescriptions printed due to printer problems   Assessment and Plan  Abnormal uterine bleeding - probably menses Sprintec 2 tablet until bleeding stops then one daily F/u for Nexplanon URI- Mucinex DM; tylenol cold/sinus  Ebubechukwu Jedlicka 07/22/2013, 11:54 AM

## 2013-07-22 NOTE — MAU Note (Signed)
Patient presents to MAU with c/o vaginal bleeding since last night that has been heavy; she states going through a pad every to 1 hour with a few small clots. Vaginal Delivery on November 1st.  Denies dizziness at this time. Also reports having a cough for the past week and vomiting for the past 2 days.

## 2013-07-25 ENCOUNTER — Ambulatory Visit (INDEPENDENT_AMBULATORY_CARE_PROVIDER_SITE_OTHER): Payer: Medicaid Other | Admitting: Obstetrics & Gynecology

## 2013-07-25 ENCOUNTER — Encounter: Payer: Self-pay | Admitting: Obstetrics & Gynecology

## 2013-07-25 VITALS — BP 127/81 | HR 65 | Ht 65.0 in | Wt 206.4 lb

## 2013-07-25 DIAGNOSIS — Z30017 Encounter for initial prescription of implantable subdermal contraceptive: Secondary | ICD-10-CM

## 2013-07-25 DIAGNOSIS — Z01812 Encounter for preprocedural laboratory examination: Secondary | ICD-10-CM

## 2013-07-25 NOTE — Progress Notes (Signed)
   Subjective:    Patient ID: Robyn Ortiz, female    DOB: 1986-04-24, 27 y.o.   MRN: [id number]  HPI [name] is here for the placement of Nexplanon. She understands that irregular bleeding is a known side effect and that we do not remove Nexplanon for this reason. She is doing well at this time.   Review of Systems     Objective:   Physical Exam  UPT was negative. Consent was signed. Time out procedure was done. Her left arm was prepped with betadine and infiltrated with 3 cc of 1% lidocaine. After adequate anesthesia was assured, the Nexplanon device was placed according to standard of care. Her arm was hemostatic and was bandaged. She tolerated the procedure well.       Assessment & Plan:   Contraception- Nexplanon

## 2013-07-28 ENCOUNTER — Encounter: Payer: Medicaid Other | Admitting: Obstetrics and Gynecology

## 2013-10-16 ENCOUNTER — Encounter (HOSPITAL_COMMUNITY): Payer: Self-pay | Admitting: *Deleted

## 2013-10-16 ENCOUNTER — Inpatient Hospital Stay (HOSPITAL_COMMUNITY)
Admission: AD | Admit: 2013-10-16 | Discharge: 2013-10-16 | Disposition: A | Discharge status: Home / Self Care | Payer: Medicaid Other | Source: Ambulatory Visit | Attending: Obstetrics & Gynecology | Admitting: Obstetrics & Gynecology

## 2013-10-16 DIAGNOSIS — J029 Acute pharyngitis, unspecified: Secondary | ICD-10-CM | POA: Insufficient documentation

## 2013-10-16 DIAGNOSIS — R6889 Other general symptoms and signs: Secondary | ICD-10-CM

## 2013-10-16 DIAGNOSIS — R52 Pain, unspecified: Secondary | ICD-10-CM | POA: Insufficient documentation

## 2013-10-16 DIAGNOSIS — Z3202 Encounter for pregnancy test, result negative: Secondary | ICD-10-CM | POA: Insufficient documentation

## 2013-10-16 DIAGNOSIS — J3489 Other specified disorders of nose and nasal sinuses: Secondary | ICD-10-CM | POA: Insufficient documentation

## 2013-10-16 DIAGNOSIS — R059 Cough, unspecified: Secondary | ICD-10-CM | POA: Insufficient documentation

## 2013-10-16 DIAGNOSIS — F17200 Nicotine dependence, unspecified, uncomplicated: Secondary | ICD-10-CM | POA: Insufficient documentation

## 2013-10-16 DIAGNOSIS — R05 Cough: Secondary | ICD-10-CM | POA: Insufficient documentation

## 2013-10-16 LAB — POCT PREGNANCY, URINE: PREG TEST UR: NEGATIVE

## 2013-10-16 MED ORDER — OSELTAMIVIR PHOSPHATE 75 MG PO CAPS: 75.0000 mg | ORAL_CAPSULE | Freq: Two times a day (BID) | ORAL | Status: DC

## 2013-10-16 NOTE — MAU Provider Note (Signed)
History     CSN: [id number]  Arrival date and time: 10/16/13 1735   First Provider Initiated Contact with Patient 10/16/13 2127      Chief Complaint  Patient presents with  . Generalized Body Aches  . Sore Throat  . Nasal Congestion  . Cough   HPI [name] is a 28 y.o. [phone] who presents to MAU today with complaint of sore throat, body aches, nasal congestion, productive cough and fever since last night. She states temperature of 100.68F this afternoon and took 400 gm ibuprofen at 1630 today. She denies known sick contacts, SOB, N/V/D or chest pain.   OB History   Grav Para Term Preterm Abortions TAB SAB Ect Mult Living   2 2 2       2       Past Medical History  Diagnosis Date  . History of bronchitis 7835yrs ago  . Migraine   . History of bleeding ulcers     told not to take ibuprofen  . Obesity     Past Surgical History  Procedure Laterality Date  . Tonsillectomy  03/10/2012    Procedure: TONSILLECTOMY;  Surgeon: Suzanna ObeyJohn Byers, MD;  [facility];  Service: ENT;  Laterality: Bilateral;    Family History  Problem Relation Age of Onset  . Diabetes Father   . Heart disease Paternal Grandmother   . Cancer Paternal Grandfather     intestional cancer  . Other Paternal Grandfather     deteriorating disc  . Bipolar disorder Mother     History  Substance Use Topics  . Smoking status: Current Every Day Smoker -- 0.50 packs/day for 13 years    Types: Cigarettes  . Smokeless tobacco: Never Used  . Alcohol Use: No    Allergies:  Allergies  Allergen Reactions  . Codeine Other (See Comments)    Pt. States she had a seizure  . Ibuprofen     Patient was diagnosed with bleeding ulcers and advised not to take this medication.    Prescriptions prior to admission  Medication Sig Dispense Refill  . acetaminophen (TYLENOL) 500 MG tablet Take 1,000 mg by mouth every 6 (six) hours as needed for pain.      Marland Kitchen. aspirin-acetaminophen-caffeine (EXCEDRIN MIGRAINE)  250-250-65 MG per tablet Take 2 tablets by mouth every 6 (six) hours as needed for headache or migraine.      Marland Kitchen. albuterol (PROVENTIL HFA;VENTOLIN HFA) 108 (90 BASE) MCG/ACT inhaler Inhale 2 puffs into the lungs every 6 (six) hours as needed for wheezing.        Review of Systems  Constitutional: Positive for fever and chills. Negative for malaise/fatigue.  Gastrointestinal: Positive for nausea and abdominal pain. Negative for vomiting, diarrhea and constipation.   Physical Exam   Blood pressure 118/68, pulse 94, temperature 98.3 F (36.8 C), temperature source Oral, resp. rate 18, height 5\' 5"  (1.651 m), weight 209 lb 9.6 oz (95.074 kg), last menstrual period 10/01/2013.  Physical Exam  Constitutional: She is oriented to person, place, and time. She appears well-developed and well-nourished. She appears ill. No distress.  HENT:  Nose: Mucosal edema and rhinorrhea present. Right sinus exhibits maxillary sinus tenderness and frontal sinus tenderness (mild). Left sinus exhibits maxillary sinus tenderness and frontal sinus tenderness.  Mouth/Throat: Mucous membranes are normal. Posterior oropharyngeal erythema present. No oropharyngeal exudate, posterior oropharyngeal edema or tonsillar abscesses.  Cardiovascular: Normal rate, regular rhythm and normal heart sounds.   Respiratory: Effort normal and breath sounds normal. No  respiratory distress.  Lymphadenopathy:       Head (right side): No submental, no submandibular and no tonsillar adenopathy present.       Head (left side): No submental, no submandibular and no tonsillar adenopathy present.    She has no cervical adenopathy.  Neurological: She is alert and oriented to person, place, and time.  Skin: Skin is warm and dry. No erythema.  Psychiatric: She has a normal mood and affect.   Results for orders placed during the hospital encounter of 10/16/13 (from the past 24 hour(s))  POCT PREGNANCY, URINE     Status: None   Collection Time     10/16/13  7:50 PM      Result Value Ref Range   Preg Test, Ur NEGATIVE  NEGATIVE     MAU Course  Procedures None  MDM UPT - negative Symptoms concerning for influenza  Assessment and Plan  A: Flu-like symptoms  P: Discharge home Rx for Tamiflu given to patient Patient advised to increased PO hydration and rest Tylenol and Ibuprofen recommended PRN for pain and fever Patient may return to MAU as needed, however advised that if symptoms worsen she should go to [facility] or [facility]  Freddi Starr, PA-C  10/16/2013, 9:53 PM

## 2013-10-16 NOTE — MAU Note (Signed)
Pt LMP 10/01/2013, with body aches, sore throat, runny nose, productive cough that started this am.  Fever of 100.5 this afternoon. 98.4 at this time. Taking ibuprofen.

## 2013-10-16 NOTE — Discharge Instructions (Signed)

## 2013-10-17 NOTE — MAU Provider Note (Signed)
Attestation of Attending Supervision of Advanced Practitioner (CNM/NP): Evaluation and management procedures were performed by the Advanced Practitioner under my supervision and collaboration.  I have reviewed the Advanced Practitioner's note and chart, and I agree with the management and plan.  HARRAWAY-SMITH, Kimani Bedoya 12:11 AM

## 2013-10-18 ENCOUNTER — Encounter (HOSPITAL_COMMUNITY): Payer: Self-pay | Admitting: Emergency Medicine

## 2013-10-18 ENCOUNTER — Emergency Department (HOSPITAL_COMMUNITY)
Admission: EM | Admit: 2013-10-18 | Discharge: 2013-10-18 | Disposition: A | Discharge status: Home / Self Care | Payer: Medicaid Other | Attending: Emergency Medicine | Admitting: Emergency Medicine

## 2013-10-18 DIAGNOSIS — E669 Obesity, unspecified: Secondary | ICD-10-CM | POA: Insufficient documentation

## 2013-10-18 DIAGNOSIS — Z872 Personal history of diseases of the skin and subcutaneous tissue: Secondary | ICD-10-CM | POA: Insufficient documentation

## 2013-10-18 DIAGNOSIS — F17200 Nicotine dependence, unspecified, uncomplicated: Secondary | ICD-10-CM | POA: Insufficient documentation

## 2013-10-18 DIAGNOSIS — Z79899 Other long term (current) drug therapy: Secondary | ICD-10-CM | POA: Insufficient documentation

## 2013-10-18 DIAGNOSIS — Z8679 Personal history of other diseases of the circulatory system: Secondary | ICD-10-CM | POA: Insufficient documentation

## 2013-10-18 DIAGNOSIS — J020 Streptococcal pharyngitis: Secondary | ICD-10-CM

## 2013-10-18 DIAGNOSIS — Z8709 Personal history of other diseases of the respiratory system: Secondary | ICD-10-CM | POA: Insufficient documentation

## 2013-10-18 LAB — RAPID STREP SCREEN (MED CTR MEBANE ONLY): Streptococcus, Group A Screen (Direct): POSITIVE — AB

## 2013-10-18 MED ORDER — AMOXICILLIN 500 MG PO CAPS: 1000.0000 mg | ORAL_CAPSULE | Freq: Every day | ORAL | Status: DC

## 2013-10-18 MED ORDER — AMOXICILLIN 500 MG PO CAPS: 500.0000 mg | ORAL_CAPSULE | Freq: Two times a day (BID) | ORAL | Status: DC

## 2013-10-18 NOTE — ED Provider Notes (Signed)
Medical screening examination/treatment/procedure(s) were performed by non-physician practitioner and as supervising physician I was immediately available for consultation/collaboration.   EKG Interpretation None        David H Yao, MD 10/18/13 1547 

## 2013-10-18 NOTE — ED Provider Notes (Signed)
CSN: [id number]     Arrival date & time 10/18/13  [id number] History   First MD Initiated Contact with Patient 10/18/13 1006     Chief Complaint  Patient presents with  . Sore Throat  . Nasal Congestion  . Cough     (Consider location/radiation/quality/duration/timing/severity/associated sxs/prior Treatment) Patient is a 28 y.o. female presenting with pharyngitis and cough. The history is provided by the patient. No language interpreter was used.  Sore Throat Episode onset: 2 dayas  Associated symptoms include coughing and a sore throat. Pertinent negatives include no chills, fever, headaches, nausea, numbness, rash, vomiting or weakness.  Cough Associated symptoms: sore throat   Associated symptoms: no chills, no fever, no headaches and no rash    patient is a 28 year old female who presents with sore throat. She reports that [facility] and diagnosed with the flu and he given Tamiflu. She reports not feeling any better and having an increasingly sore throat. She had a tonsillectomy and 2013 and reports that she has had frequent strep infections. She denies fever, chills, nausea or vomiting. No rash. She has a "hot potato" voice and reports that it is very painful to swallow.   Past Medical History  Diagnosis Date  . History of bronchitis 4842yrs ago  . Migraine   . History of bleeding ulcers     told not to take ibuprofen  . Obesity    Past Surgical History  Procedure Laterality Date  . Tonsillectomy  03/10/2012    Procedure: TONSILLECTOMY;  Surgeon: Suzanna ObeyJohn Byers, MD;  [facility];  Service: ENT;  Laterality: Bilateral;   Family History  Problem Relation Age of Onset  . Diabetes Father   . Heart disease Paternal Grandmother   . Cancer Paternal Grandfather     intestional cancer  . Other Paternal Grandfather     deteriorating disc  . Bipolar disorder Mother    History  Substance Use Topics  . Smoking status: Current Every Day Smoker -- 0.50  packs/day for 13 years    Types: Cigarettes  . Smokeless tobacco: Never Used  . Alcohol Use: No   OB History   Grav Para Term Preterm Abortions TAB SAB Ect Mult Living   2 2 2       2      Review of Systems  Constitutional: Negative for fever and chills.  HENT: Positive for sore throat and voice change. Negative for trouble swallowing.   Respiratory: Positive for cough.   Gastrointestinal: Negative for nausea and vomiting.  Skin: Negative for rash.  Neurological: Negative for weakness, numbness and headaches.  All other systems reviewed and are negative.      Allergies  Codeine and Ibuprofen  Home Medications   Current Outpatient Rx  Name  Route  Sig  Dispense  Refill  . acetaminophen (TYLENOL) 500 MG tablet   Oral   Take 1,000 mg by mouth every 6 (six) hours as needed for pain.         Marland Kitchen. albuterol (PROVENTIL HFA;VENTOLIN HFA) 108 (90 BASE) MCG/ACT inhaler   Inhalation   Inhale 2 puffs into the lungs every 6 (six) hours as needed for wheezing.         Marland Kitchen. aspirin-acetaminophen-caffeine (EXCEDRIN MIGRAINE) 250-250-65 MG per tablet   Oral   Take 2 tablets by mouth every 6 (six) hours as needed for headache or migraine.         Marland Kitchen. etonogestrel (IMPLANON) 68 MG IMPL implant  Subcutaneous   Inject 1 each into the skin once.         Marland Kitchen oseltamivir (TAMIFLU) 75 MG capsule   Oral   Take 1 capsule (75 mg total) by mouth every 12 (twelve) hours.   10 capsule   0    BP 121/65  Pulse 102  Temp(Src) 97.7 F (36.5 C)  SpO2 97%  LMP 10/01/2013 Physical Exam  Nursing note and vitals reviewed. Constitutional: She is oriented to person, place, and time. She appears well-developed and well-nourished. No distress.  HENT:  Mouth/Throat: Uvula is midline. Posterior oropharyngeal erythema present.  Post oropharyngeal erythema, bright red with exudates.  No tonsils, had tonsillectomy in 2013. "hot potato" voice.   Eyes: Conjunctivae and EOM are normal.  Neck: Normal  range of motion. Neck supple. No JVD present. No tracheal deviation present.  Cardiovascular: Normal rate, regular rhythm and normal heart sounds.   Pulmonary/Chest: Effort normal and breath sounds normal. No respiratory distress. She has no wheezes.  Musculoskeletal: Normal range of motion.  Lymphadenopathy:    She has no cervical adenopathy.  Neurological: She is alert and oriented to person, place, and time.  Skin: Skin is warm and dry. No rash noted.  Psychiatric: She has a normal mood and affect. Her behavior is normal. Judgment and thought content normal.    ED Course  Procedures (including critical care time) Labs Review Labs Reviewed  RAPID STREP SCREEN - Abnormal; Notable for the following:    Streptococcus, Group A Screen (Direct) POSITIVE (*)    All other components within normal limits   Imaging Review No results found.   EKG Interpretation None      MDM   Final diagnoses:  Strep pharyngitis    Diagnosed with flu 2 days ago and given Tamiflu. Increasing sore throat with positive strep screen here. Patient has "hot potato" voice and reports soreness with swallowing however, no oropharyngeal edema seen. Uvula midline. No difficulty breathing. No fever, chills, headache or rash. Amoxicillin prescribed, plan of care discussed and patient agrees. Return precautions given.       Irish Elders, NP 10/18/13 1045

## 2013-10-18 NOTE — ED Notes (Signed)
C/o sore throat, body aches, nasal congestion, pains in stomach, some coughing, states started about 3 days ago, [facility], states maybe flu or bronchitis, given prescription for tamiflu

## 2013-10-18 NOTE — Discharge Instructions (Signed)
Strep Throat Strep throat is an infection of the throat caused by a bacteria named Streptococcus pyogenes. Your caregiver may call the infection streptococcal "tonsillitis" or "pharyngitis" depending on whether there are signs of inflammation in the tonsils or back of the throat. Strep throat is most common in children aged 27 15 years during the cold months of the year, but it can occur in people of any age during any season. This infection is spread from person to person (contagious) through coughing, sneezing, or other close contact. SYMPTOMS   Fever or chills.  Painful, swollen, red tonsils or throat.  Pain or difficulty when swallowing.  White or yellow spots on the tonsils or throat.  Swollen, tender lymph nodes or "glands" of the neck or under the jaw.  Red rash all over the body (rare). DIAGNOSIS  Many different infections can cause the same symptoms. A test must be done to confirm the diagnosis so the right treatment can be given. A "rapid strep test" can help your caregiver make the diagnosis in a few minutes. If this test is not available, a light swab of the infected area can be used for a throat culture test. If a throat culture test is done, results are usually available in a day or two. TREATMENT  Strep throat is treated with antibiotic medicine. HOME CARE INSTRUCTIONS   Gargle with 1 tsp of salt in 1 cup of warm water, 3 4 times per day or as needed for comfort.  Family members who also have a sore throat or fever should be tested for strep throat and treated with antibiotics if they have the strep infection.  Make sure everyone in your household washes their hands well.  Do not share food, drinking cups, or personal items that could cause the infection to spread to others.  You may need to eat a soft food diet until your sore throat gets better.  Drink enough water and fluids to keep your urine clear or pale yellow. This will help prevent dehydration.  Get plenty of  rest.  Stay home from school, daycare, or work until you have been on antibiotics for 24 hours.  Only take over-the-counter or prescription medicines for pain, discomfort, or fever as directed by your caregiver.  If antibiotics are prescribed, take them as directed. Finish them even if you start to feel better. SEEK MEDICAL CARE IF:   The glands in your neck continue to enlarge.  You develop a rash, cough, or earache.  You cough up green, yellow-brown, or bloody sputum.  You have pain or discomfort not controlled by medicines.  Your problems seem to be getting worse rather than better. SEEK IMMEDIATE MEDICAL CARE IF:   You develop any new symptoms such as vomiting, severe headache, stiff or painful neck, chest pain, shortness of breath, or trouble swallowing.  You develop severe throat pain, drooling, or changes in your voice.  You develop swelling of the neck, or the skin on the neck becomes red and tender.  You have a fever.  You develop signs of dehydration, such as fatigue, dry mouth, and decreased urination.  You become increasingly sleepy, or you cannot wake up completely. Document Released: 07/31/2000 Document Revised: 07/20/2012 Document Reviewed: 10/02/2010 [facility] Patient Information 2014 [employer] [place].   Increase oral fluids Take antibiotic as prescribed Use sore throat lozenges Return if symptoms worsen, difficulty swallowing or difficulty breathing

## 2013-11-15 ENCOUNTER — Emergency Department (HOSPITAL_COMMUNITY)
Admission: EM | Admit: 2013-11-15 | Discharge: 2013-11-15 | Disposition: A | Discharge status: Home / Self Care | Payer: Medicaid Other | Attending: Emergency Medicine | Admitting: Emergency Medicine

## 2013-11-15 ENCOUNTER — Encounter (HOSPITAL_COMMUNITY): Payer: Self-pay | Admitting: Emergency Medicine

## 2013-11-15 DIAGNOSIS — Z792 Long term (current) use of antibiotics: Secondary | ICD-10-CM | POA: Insufficient documentation

## 2013-11-15 DIAGNOSIS — J069 Acute upper respiratory infection, unspecified: Secondary | ICD-10-CM | POA: Insufficient documentation

## 2013-11-15 DIAGNOSIS — G43909 Migraine, unspecified, not intractable, without status migrainosus: Secondary | ICD-10-CM | POA: Insufficient documentation

## 2013-11-15 DIAGNOSIS — E669 Obesity, unspecified: Secondary | ICD-10-CM | POA: Insufficient documentation

## 2013-11-15 DIAGNOSIS — Z79899 Other long term (current) drug therapy: Secondary | ICD-10-CM | POA: Insufficient documentation

## 2013-11-15 DIAGNOSIS — F17200 Nicotine dependence, unspecified, uncomplicated: Secondary | ICD-10-CM | POA: Insufficient documentation

## 2013-11-15 DIAGNOSIS — Z8719 Personal history of other diseases of the digestive system: Secondary | ICD-10-CM | POA: Insufficient documentation

## 2013-11-15 MED ORDER — DEXTROMETHORPHAN POLISTIREX 30 MG/5ML PO LQCR: 15.0000 mg | ORAL | Status: DC | PRN

## 2013-11-15 MED ORDER — CETIRIZINE HCL 10 MG PO TABS: 10.0000 mg | ORAL_TABLET | Freq: Every day | ORAL | Status: DC

## 2013-11-15 NOTE — ED Notes (Signed)
Initial Contact - pt report cough/cold symp x3 days, reports unrelieved by motrin at home.  Pt is well appearing, NAD.  Skin PWD.  Speaking full/clear sentences, rr even/un-lab.

## 2013-11-15 NOTE — ED Provider Notes (Signed)
CSN: [id number]     Arrival date & time 11/15/13  1042 History   First MD Initiated Contact with Patient 11/15/13 1102     Chief Complaint  Patient presents with  . Sore Throat  . Nasal Congestion     (Consider location/radiation/quality/duration/timing/severity/associated sxs/prior Treatment) HPI Comments: Patient presents to the emergency department with chief complaint of cough, sore throat, runny nose, and headache. She states that she's been feeling ill since Monday. She is tried taking ibuprofen with no relief. She denies fevers or chills. She denies any sick contacts. There no aggravating or alleviating factors. She denies nausea, vomiting, diarrhea, constipation, or dysuria.  The history is provided by the patient. No language interpreter was used.    Past Medical History  Diagnosis Date  . History of bronchitis 7525yrs ago  . Migraine   . History of bleeding ulcers     told not to take ibuprofen  . Obesity    Past Surgical History  Procedure Laterality Date  . Tonsillectomy  03/10/2012    Procedure: TONSILLECTOMY;  Surgeon: Suzanna ObeyJohn Byers, MD;  [facility];  Service: ENT;  Laterality: Bilateral;   Family History  Problem Relation Age of Onset  . Diabetes Father   . Heart disease Paternal Grandmother   . Cancer Paternal Grandfather     intestional cancer  . Other Paternal Grandfather     deteriorating disc  . Bipolar disorder Mother    History  Substance Use Topics  . Smoking status: Current Every Day Smoker -- 0.50 packs/day for 13 years    Types: Cigarettes  . Smokeless tobacco: Never Used  . Alcohol Use: No   OB History   Grav Para Term Preterm Abortions TAB SAB Ect Mult Living   2 2 2       2      Review of Systems  Constitutional: Positive for chills. Negative for fever.  HENT: Positive for postnasal drip, rhinorrhea, sinus pressure, sneezing and sore throat.   Respiratory: Positive for cough. Negative for shortness of breath.   Cardiovascular: Negative  for chest pain.  Gastrointestinal: Negative for nausea, vomiting, abdominal pain, diarrhea and constipation.  Genitourinary: Negative for dysuria.      Allergies  Codeine and Ibuprofen  Home Medications   Current Outpatient Rx  Name  Route  Sig  Dispense  Refill  . acetaminophen (TYLENOL) 500 MG tablet   Oral   Take 1,000 mg by mouth every 6 (six) hours as needed for pain.         Marland Kitchen. albuterol (PROVENTIL HFA;VENTOLIN HFA) 108 (90 BASE) MCG/ACT inhaler   Inhalation   Inhale 2 puffs into the lungs every 6 (six) hours as needed for wheezing.         Marland Kitchen. amoxicillin (AMOXIL) 500 MG capsule   Oral   Take 1 capsule (500 mg total) by mouth 2 (two) times daily.   14 capsule   0   . aspirin-acetaminophen-caffeine (EXCEDRIN MIGRAINE) 250-250-65 MG per tablet   Oral   Take 2 tablets by mouth every 6 (six) hours as needed for headache or migraine.         Marland Kitchen. etonogestrel (IMPLANON) 68 MG IMPL implant   Subcutaneous   Inject 1 each into the skin once.         Marland Kitchen. oseltamivir (TAMIFLU) 75 MG capsule   Oral   Take 1 capsule (75 mg total) by mouth every 12 (twelve) hours.   10 capsule   0  BP 118/67  Pulse 79  Temp(Src) 97.9 F (36.6 C) (Oral)  Resp 18  SpO2 97%  LMP 11/07/2013 Physical Exam  Nursing note and vitals reviewed. Constitutional: She is oriented to person, place, and time. She appears well-developed and well-nourished.  HENT:  Head: Normocephalic and atraumatic.  Oropharynx is moderately erythematous, there are no tonsillar exudates, no evidence of abscess, uvula is midline, airway is intact, bilateral tympanic membranes are clear  Eyes: Conjunctivae and EOM are normal. Pupils are equal, round, and reactive to light.  Neck: Normal range of motion. Neck supple.  Cardiovascular: Normal rate and regular rhythm.  Exam reveals no gallop and no friction rub.   No murmur heard. Pulmonary/Chest: Effort normal and breath sounds normal. No respiratory distress. She  has no wheezes. She has no rales. She exhibits no tenderness.  Clear to auscultation bilaterally  Abdominal: Soft. She exhibits no distension and no mass. There is no tenderness. There is no rebound and no guarding.  Musculoskeletal: Normal range of motion. She exhibits no edema and no tenderness.  Neurological: She is alert and oriented to person, place, and time.  Skin: Skin is warm and dry.  Psychiatric: She has a normal mood and affect. Her behavior is normal. Judgment and thought content normal.    ED Course  Procedures (including critical care time) Labs Review Labs Reviewed - No data to display Imaging Review No results found.   EKG Interpretation None      MDM   Final diagnoses:  URI (upper respiratory infection)   Patients symptoms are consistent with URI, likely viral etiology. Discussed that antibiotics are not indicated for viral infections. Pt will be discharged with symptomatic treatment.  Verbalizes understanding and is agreeable with plan. Pt is hemodynamically stable & in NAD prior to dc. Patient is not breast-feeding.     Roxy Horseman, PA-C 11/15/13 1156

## 2013-11-15 NOTE — Discharge Instructions (Signed)

## 2013-11-15 NOTE — ED Notes (Signed)
Pt states cold like symptoms since Monday.  Cough, sore throat, congestion.  Unknown for fever.

## 2013-11-15 NOTE — ED Provider Notes (Signed)
Medical screening examination/treatment/procedure(s) were performed by non-physician practitioner and as supervising physician I was immediately available for consultation/collaboration.   EKG Interpretation None        Malcom Selmer M Kairos Panetta, MD 11/15/13 1523 

## 2013-11-27 ENCOUNTER — Encounter (HOSPITAL_COMMUNITY): Payer: Self-pay | Admitting: Emergency Medicine

## 2013-11-27 ENCOUNTER — Emergency Department (HOSPITAL_COMMUNITY)
Admission: EM | Admit: 2013-11-27 | Discharge: 2013-11-27 | Disposition: A | Discharge status: Home / Self Care | Payer: Medicaid Other | Attending: Emergency Medicine | Admitting: Emergency Medicine

## 2013-11-27 DIAGNOSIS — Z8709 Personal history of other diseases of the respiratory system: Secondary | ICD-10-CM | POA: Insufficient documentation

## 2013-11-27 DIAGNOSIS — Z8679 Personal history of other diseases of the circulatory system: Secondary | ICD-10-CM | POA: Insufficient documentation

## 2013-11-27 DIAGNOSIS — F17200 Nicotine dependence, unspecified, uncomplicated: Secondary | ICD-10-CM | POA: Insufficient documentation

## 2013-11-27 DIAGNOSIS — E669 Obesity, unspecified: Secondary | ICD-10-CM | POA: Insufficient documentation

## 2013-11-27 DIAGNOSIS — K529 Noninfective gastroenteritis and colitis, unspecified: Secondary | ICD-10-CM

## 2013-11-27 DIAGNOSIS — K5289 Other specified noninfective gastroenteritis and colitis: Secondary | ICD-10-CM | POA: Insufficient documentation

## 2013-11-27 DIAGNOSIS — Z3202 Encounter for pregnancy test, result negative: Secondary | ICD-10-CM | POA: Insufficient documentation

## 2013-11-27 LAB — COMPREHENSIVE METABOLIC PANEL
ALT: 19 U/L (ref 0–35)
AST: 15 U/L (ref 0–37)
Albumin: 4.3 g/dL (ref 3.5–5.2)
Alkaline Phosphatase: 75 U/L (ref 39–117)
BILIRUBIN TOTAL: 0.7 mg/dL (ref 0.3–1.2)
BUN: 13 mg/dL (ref 6–23)
CHLORIDE: 102 meq/L (ref 96–112)
CO2: 23 meq/L (ref 19–32)
CREATININE: 0.63 mg/dL (ref 0.50–1.10)
Calcium: 9.6 mg/dL (ref 8.4–10.5)
GLUCOSE: 90 mg/dL (ref 70–99)
Potassium: 4.1 mEq/L (ref 3.7–5.3)
Sodium: 139 mEq/L (ref 137–147)
Total Protein: 8.2 g/dL (ref 6.0–8.3)

## 2013-11-27 LAB — CBC WITH DIFFERENTIAL/PLATELET
Basophils Absolute: 0 10*3/uL (ref 0.0–0.1)
Basophils Relative: 0 % (ref 0–1)
Eosinophils Absolute: 0.2 10*3/uL (ref 0.0–0.7)
Eosinophils Relative: 2 % (ref 0–5)
HEMATOCRIT: 38.7 % (ref 36.0–46.0)
Hemoglobin: 12.8 g/dL (ref 12.0–15.0)
LYMPHS PCT: 23 % (ref 12–46)
Lymphs Abs: 1.8 10*3/uL (ref 0.7–4.0)
MCH: 29.9 pg (ref 26.0–34.0)
MCHC: 33.1 g/dL (ref 30.0–36.0)
MCV: 90.4 fL (ref 78.0–100.0)
MONO ABS: 0.5 10*3/uL (ref 0.1–1.0)
MONOS PCT: 6 % (ref 3–12)
NEUTROS ABS: 5.3 10*3/uL (ref 1.7–7.7)
Neutrophils Relative %: 69 % (ref 43–77)
Platelets: 289 10*3/uL (ref 150–400)
RBC: 4.28 MIL/uL (ref 3.87–5.11)
RDW: 12.9 % (ref 11.5–15.5)
WBC: 7.7 10*3/uL (ref 4.0–10.5)

## 2013-11-27 LAB — URINALYSIS, ROUTINE W REFLEX MICROSCOPIC
Glucose, UA: NEGATIVE mg/dL
Hgb urine dipstick: NEGATIVE
KETONES UR: 15 mg/dL — AB
NITRITE: NEGATIVE
PROTEIN: NEGATIVE mg/dL
Specific Gravity, Urine: 1.025 (ref 1.005–1.030)
UROBILINOGEN UA: 0.2 mg/dL (ref 0.0–1.0)
pH: 5 (ref 5.0–8.0)

## 2013-11-27 LAB — URINE MICROSCOPIC-ADD ON

## 2013-11-27 LAB — LIPASE, BLOOD: LIPASE: 16 U/L (ref 11–59)

## 2013-11-27 LAB — POC URINE PREG, ED: PREG TEST UR: NEGATIVE

## 2013-11-27 MED ORDER — ONDANSETRON HCL 4 MG/2ML IJ SOLN
4.0000 mg | Freq: Once | INTRAMUSCULAR | Status: AC
  Administered 2013-11-27: 4 mg via INTRAVENOUS
  Filled 2013-11-27: qty 2

## 2013-11-27 MED ORDER — SODIUM CHLORIDE 0.9 % IV BOLUS (SEPSIS)
1000.0000 mL | Freq: Once | INTRAVENOUS | Status: AC
  Administered 2013-11-27: 1000 mL via INTRAVENOUS

## 2013-11-27 MED ORDER — LOPERAMIDE HCL 2 MG PO CAPS
2.0000 mg | ORAL_CAPSULE | Freq: Once | ORAL | Status: AC
  Administered 2013-11-27: 2 mg via ORAL
  Filled 2013-11-27: qty 1

## 2013-11-27 MED ORDER — ONDANSETRON HCL 4 MG PO TABS: 4.0000 mg | ORAL_TABLET | Freq: Four times a day (QID) | ORAL | Status: DC

## 2013-11-27 NOTE — ED Notes (Signed)
Patient reports having generalized abdominal pain, N/V/D since 0500. Patient denies any blood in her stool and emesis.

## 2013-11-27 NOTE — ED Provider Notes (Addendum)
CSN: [id number]     Arrival date & time 11/27/13  1703 History   First MD Initiated Contact with Patient 11/27/13 1941     Chief Complaint  Patient presents with  . Emesis  . Diarrhea  . Abdominal Pain     (Consider location/radiation/quality/duration/timing/severity/associated sxs/prior Treatment) Patient is a 28 y.o. female presenting with vomiting, diarrhea, and abdominal pain. The history is provided by the patient.  Emesis Severity:  Severe Duration:  16 hours Timing:  Constant Number of daily episodes:  Numerous 1-2 times per hour or more Quality:  Stomach contents Progression:  Unchanged Chronicity:  New Relieved by:  Nothing Worsened by:  Food smell, ice chips and liquids Ineffective treatments:  None tried Associated symptoms: abdominal pain and diarrhea   Associated symptoms: no fever, no myalgias and no sore throat   Associated symptoms comment:  Generalized abd pain that started after all the vomiting Risk factors: no alcohol use, no diabetes, no prior abdominal surgery, no sick contacts, no suspect food intake and no travel to endemic areas   Diarrhea Associated symptoms: abdominal pain and vomiting   Associated symptoms: no myalgias   Abdominal Pain Associated symptoms: diarrhea and vomiting   Associated symptoms: no dysuria, no sore throat, no vaginal bleeding and no vaginal discharge     Past Medical History  Diagnosis Date  . History of bronchitis 6728yrs ago  . Migraine   . History of bleeding ulcers     told not to take ibuprofen  . Obesity    Past Surgical History  Procedure Laterality Date  . Tonsillectomy  03/10/2012    Procedure: TONSILLECTOMY;  Surgeon: Suzanna ObeyJohn Byers, MD;  [facility];  Service: ENT;  Laterality: Bilateral;   Family History  Problem Relation Age of Onset  . Diabetes Father   . Heart disease Paternal Grandmother   . Cancer Paternal Grandfather     intestional cancer  . Other Paternal Grandfather     deteriorating disc  .  Bipolar disorder Mother    History  Substance Use Topics  . Smoking status: Current Every Day Smoker -- 0.50 packs/day for 13 years    Types: Cigarettes  . Smokeless tobacco: Never Used  . Alcohol Use: No   OB History   Grav Para Term Preterm Abortions TAB SAB Ect Mult Living   2 2 2       2      Review of Systems  HENT: Negative for sore throat.   Gastrointestinal: Positive for vomiting, abdominal pain and diarrhea.  Genitourinary: Negative for dysuria, vaginal bleeding and vaginal discharge.  Musculoskeletal: Negative for myalgias.  All other systems reviewed and are negative.     Allergies  Codeine and Ibuprofen  Home Medications   Current Outpatient Rx  Name  Route  Sig  Dispense  Refill  . PRESCRIPTION MEDICATION      See admin instructions. HCG injection once a week on Friday for weightloss         . etonogestrel (IMPLANON) 68 MG IMPL implant   Subcutaneous   Inject 1 each into the skin once.          BP 103/62  Pulse 85  Temp(Src) 99.1 F (37.3 C) (Oral)  Resp 16  SpO2 100%  LMP 11/07/2013 Physical Exam  Nursing note and vitals reviewed. Constitutional: She is oriented to person, place, and time. She appears well-developed and well-nourished. No distress.  HENT:  Head: Normocephalic and atraumatic.  Mouth/Throat: Oropharynx is clear and moist.  Eyes: Conjunctivae and EOM are normal. Pupils are equal, round, and reactive to light.  Neck: Normal range of motion. Neck supple.  Cardiovascular: Normal rate, regular rhythm and intact distal pulses.   No murmur heard. Pulmonary/Chest: Effort normal and breath sounds normal. No respiratory distress. She has no wheezes. She has no rales.  Abdominal: Soft. She exhibits no distension. There is tenderness in the epigastric area. There is no rebound and no guarding.  Mild epigastric tenderness  Musculoskeletal: Normal range of motion. She exhibits no edema and no tenderness.  Neurological: She is alert and  oriented to person, place, and time.  Skin: Skin is warm and dry. No rash noted. No erythema.  Psychiatric: She has a normal mood and affect. Her behavior is normal.    ED Course  Procedures (including critical care time) Labs Review Labs Reviewed  URINALYSIS, ROUTINE W REFLEX MICROSCOPIC - Abnormal; Notable for the following:    APPearance CLOUDY (*)    Bilirubin Urine SMALL (*)    Ketones, ur 15 (*)    Leukocytes, UA SMALL (*)    All other components within normal limits  URINE MICROSCOPIC-ADD ON - Abnormal; Notable for the following:    Bacteria, UA FEW (*)    Casts GRANULAR CAST (*)    All other components within normal limits  CBC WITH DIFFERENTIAL  COMPREHENSIVE METABOLIC PANEL  LIPASE, BLOOD  POC URINE PREG, ED   Imaging Review No results found.   EKG Interpretation None      MDM   Final diagnoses:  Gastroenteritis    Pt with symptoms most consistent with a viral process with fever/vomitting/diarrhea.  Denies bad food exposure and recent travel out of the country.  No recent abx.  No hx concerning for GU pathology or kidney stones.  Pt is awake and alert on exam without peritoneal signs.  Will give IVF and zofran.  Will then po challenge.  10:24 PM Pt feeling better and tolerating po's.  Will d/c home.  Gwyneth SproutWhitney Emmerich Cryer, MD 11/27/13 [id number]  Gwyneth SproutWhitney Bradan Congrove, MD 11/27/13 2227

## 2013-11-28 ENCOUNTER — Ambulatory Visit: Payer: Medicaid Other | Admitting: Obstetrics & Gynecology

## 2013-12-28 ENCOUNTER — Ambulatory Visit (INDEPENDENT_AMBULATORY_CARE_PROVIDER_SITE_OTHER): Payer: Medicaid Other | Admitting: Obstetrics & Gynecology

## 2013-12-28 ENCOUNTER — Encounter: Payer: Self-pay | Admitting: Obstetrics & Gynecology

## 2013-12-28 VITALS — BP 113/76 | HR 66 | Ht 65.0 in | Wt 203.0 lb

## 2013-12-28 DIAGNOSIS — N939 Abnormal uterine and vaginal bleeding, unspecified: Secondary | ICD-10-CM

## 2013-12-28 DIAGNOSIS — N926 Irregular menstruation, unspecified: Secondary | ICD-10-CM

## 2013-12-28 NOTE — Progress Notes (Signed)
   CLINIC ENCOUNTER NOTE  History:  28 y.o. [id number] here today for irregular bleeding x 1 episode. Normal periods after last SVD in 06/2013. Associated with cramping.    The following portions of the patient's history were reviewed and updated as appropriate: allergies, current medications, past family history, past medical history, past social history, past surgical history and problem list.  Normal pap in 11/21/12.  Review of Systems:  Pertinent items are noted in HPI.  Objective:  Physical Exam BP 113/76  Pulse 66  Ht 5\' 5"  (1.651 m)  Wt 203 lb (92.08 kg)  BMI 33.78 kg/m2 Gen: NAD Abd: Soft, nontender and nondistended Pelvic: Normal appearing external genitalia; normal appearing vaginal mucosa and cervix.  Bloody discharge.  Small uterus, no other palpable masses, no uterine or adnexal tenderness  UPT in clinic : Negative  Assessment & Plan:  Follow up pelvic cultures. Negative UPT. Patient can take NSAIDs as needed. If bleeding continues, will need further labs and pelvic ultrasound for further evaluation    Jaynie CollinsUGONNA  Leiliana Foody, MD, FACOG Attending Obstetrician & Gynecologist Faculty Practice, [facility]

## 2013-12-28 NOTE — Progress Notes (Signed)
Irregular bleeding.

## 2013-12-28 NOTE — Patient Instructions (Signed)
Return to clinic for any scheduled appointments or for any gynecologic concerns as needed.   

## 2013-12-29 LAB — WET PREP, GENITAL
TRICH WET PREP: NONE SEEN
Yeast Wet Prep HPF POC: NONE SEEN

## 2013-12-29 LAB — GC/CHLAMYDIA PROBE AMP
CT Probe RNA: NEGATIVE
GC PROBE AMP APTIMA: NEGATIVE

## 2013-12-29 LAB — HCG, QUANTITATIVE, PREGNANCY: hCG, Beta Chain, Quant, S: <2 m[IU]/mL

## 2014-02-26 ENCOUNTER — Ambulatory Visit: Payer: Medicaid Other | Admitting: Obstetrics and Gynecology

## 2014-03-06 ENCOUNTER — Encounter: Payer: Self-pay | Admitting: General Surgery

## 2014-03-06 ENCOUNTER — Ambulatory Visit (INDEPENDENT_AMBULATORY_CARE_PROVIDER_SITE_OTHER): Payer: Medicaid Other | Admitting: General Surgery

## 2014-03-06 VITALS — BP 130/84 | HR 72 | Temp 97.9°F | Resp 14 | Ht 65.0 in | Wt 202.0 lb

## 2014-03-06 DIAGNOSIS — K8020 Calculus of gallbladder without cholecystitis without obstruction: Secondary | ICD-10-CM

## 2014-03-06 NOTE — Progress Notes (Signed)
Patient ID: Robyn Ortiz, female   DOB: November 20, 1985, 28 y.o.   MRN: [id number]  Chief Complaint  Patient presents with  . Other    gall stones    HPI Robyn Ortiz is a 28 y.o. female.  Here today for evaluation of gallstones. She has been having abdominal pain on and off since the birth of her son in November 2014. Initially, the pain started in the epigastric region. Now, the pain is left abdomen occasionally on the right and radiates to her back. It is associated with spicy foods. The pain is sharpe "stinging" feeling, lasting 5 -10 minutes. The pain has occurred during the night while asleep but not often.  Office Ultrasound was done February 15 2014 and they told her she had gall stones.  She was having diarrhea in June and sent a lab stool sample, she was told she had C Diff and she was started on Cipro last week.  Her stools are once a week and described as "runny".  HPI  Past Medical History  Diagnosis Date  . History of bronchitis 465yrs ago  . Migraine   . History of bleeding ulcers     told not to take ibuprofen  . Obesity   . GERD (gastroesophageal reflux disease)     Past Surgical History  Procedure Laterality Date  . Tonsillectomy  03/10/2012    Procedure: TONSILLECTOMY;  Surgeon: Suzanna ObeyJohn Byers, MD;  [facility];  Service: ENT;  Laterality: Bilateral;    Family History  Problem Relation Age of Onset  . Diabetes Father   . Heart disease Paternal Grandmother   . Cancer Paternal Grandfather     intestional cancer  . Other Paternal Grandfather     deteriorating disc  . Bipolar disorder Mother     Social History History  Substance Use Topics  . Smoking status: Current Every Day Smoker -- 0.50 packs/day for 13 years    Types: Cigarettes  . Smokeless tobacco: Never Used  . Alcohol Use: Yes     Comment: beer occasionally    Allergies  Allergen Reactions  . Codeine Other (See Comments)    Pt. States she had a seizure  . Ibuprofen     Patient was  diagnosed with bleeding ulcers and advised not to take this medication.    Current Outpatient Prescriptions  Medication Sig Dispense Refill  . cetirizine (ZYRTEC) 10 MG tablet Take 10 mg by mouth daily.      . ciprofloxacin (CIPRO) 500 MG tablet Take 500 mg by mouth 2 (two) times daily.      Marland Kitchen. etonogestrel (IMPLANON) 68 MG IMPL implant Inject 1 each into the skin once.      . ondansetron (ZOFRAN) 4 MG tablet Take 1 tablet (4 mg total) by mouth every 6 (six) hours.  6 tablet  0  . pantoprazole (PROTONIX) 40 MG tablet Take 40 mg by mouth daily.      . phentermine 37.5 MG capsule Take 37.5 mg by mouth every morning.       No current facility-administered medications for this visit.    Review of Systems Review of Systems  Constitutional: Negative.   Cardiovascular: Negative.   Gastrointestinal: Positive for abdominal pain and diarrhea. Negative for nausea, vomiting and blood in stool.    Blood pressure 130/84, pulse 72, temperature 97.9 F (36.6 C), temperature source Oral, resp. rate 14, height 5\' 5"  (1.651 m), weight 202 lb (91.627 kg), last menstrual period 02/14/2014.  Physical Exam Physical  Exam  Constitutional: She is oriented to person, place, and time. She appears well-developed and well-nourished.  Neck: Neck supple.  Cardiovascular: Normal rate, regular rhythm and normal heart sounds.   Pulmonary/Chest: Effort normal and breath sounds normal.  Abdominal: Soft. Normal appearance and bowel sounds are normal. There is tenderness in the right upper quadrant and left upper quadrant.  Left tenderness> right tenderness  Lymphadenopathy:    She has no cervical adenopathy.  Neurological: She is alert and oriented to person, place, and time.  Skin: Skin is warm and dry.    Data Reviewed Laboratory studies dated 01/17/2014 showed a normal CBC with a hemoglobin of 14.5, white blood cell count, 5300 with normal differential platelet count 283,000. Comprehensive metabolic panel was  unremarkable.  PCP notes of 02/15/2014 were reviewed.  Ultrasound images from an in office study completed 02/15/2014 labeled Robyn Ortiz, ID #[id number] showed multiple stones with 10 shadowing. Normal gallbladder wall thickness.  No images of the common bile duct were identified.  Assessment    Symptomatic cholelithiasis.  History of diarrhea with past history of Clostridium difficile infection.    Plan    Indications for elective cholecystectomy were reviewed.    Laparoscopic Cholecystectomy with Intraoperative Cholangiogram. The procedure, including it's potential risks and complications (including but not limited to infection, bleeding, injury to intra-abdominal organs or bile ducts, bile leak, poor cosmetic result, sepsis and death) were discussed with the patient in detail. Non-operative options, including their inherent risks (acute calculous cholecystitis with possible choledocholithiasis or gallstone pancreatitis, with the risk of ascending cholangitis, sepsis, and death) were discussed as well. The patient expressed and understanding of what we discussed and wishes to proceed with laparoscopic cholecystectomy. The patient further understands that if it is technically not possible, or it is unsafe to proceed laparoscopically, that I will convert to an open cholecystectomy.  Patient is scheduled for surgery at [facility] on 03/27/14. She will pre admit by phone on 03/16/14. Patient is aware of date and instructions.   Ref Franco Nones NP PCP: Evelene Croon   Robyn Ortiz 03/07/2014, 1:13 PM

## 2014-03-06 NOTE — Patient Instructions (Addendum)
Laparoscopic Cholecystectomy Laparoscopic cholecystectomy is surgery to remove the gallbladder. The gallbladder is located in the upper right part of the abdomen, behind the liver. It is a storage sac for bile produced in the liver. Bile aids in the digestion and absorption of fats. Cholecystectomy is often done for inflammation of the gallbladder (cholecystitis). This condition is usually caused by a buildup of gallstones (cholelithiasis) in your gallbladder. Gallstones can block the flow of bile, resulting in inflammation and pain. In severe cases, emergency surgery may be required. When emergency surgery is not required, you will have time to prepare for the procedure. Laparoscopic surgery is an alternative to open surgery. Laparoscopic surgery has a shorter recovery time. Your common bile duct may also need to be examined during the procedure. If stones are found in the common bile duct, they may be removed. LET YOUR HEALTH CARE PROVIDER KNOW ABOUT:  Any allergies you have.  All medicines you are taking, including vitamins, herbs, eye drops, creams, and over-the-counter medicines.  Previous problems you or members of your family have had with the use of anesthetics.  Any blood disorders you have.  Previous surgeries you have had.  Medical conditions you have. RISKS AND COMPLICATIONS Generally, this is a safe procedure. However, as with any procedure, complications can occur. Possible complications include:  Infection.  Damage to the common bile duct, nerves, arteries, veins, or other internal organs such as the stomach, liver, or intestines.  Bleeding.  A stone may remain in the common bile duct.  A bile leak from the cyst duct that is clipped when your gallbladder is removed.  The need to convert to open surgery, which requires a larger incision in the abdomen. This may be necessary if your surgeon thinks it is not safe to continue with a laparoscopic procedure. BEFORE THE  PROCEDURE  Ask your health care provider about changing or stopping any regular medicines. You will need to stop taking aspirin or blood thinners at least 5 days prior to surgery.  Do not eat or drink anything after midnight the night before surgery.  Let your health care provider know if you develop a cold or other infectious problem before surgery. PROCEDURE   You will be given medicine to make you sleep through the procedure (general anesthetic). A breathing tube will be placed in your mouth.  When you are asleep, your surgeon will make several small cuts (incisions) in your abdomen.  A thin, lighted tube with a tiny camera on the end (laparoscope) is inserted through one of the small incisions. The camera on the laparoscope sends a picture to a TV screen in the operating room. This gives the surgeon a good view inside your abdomen.  A gas will be pumped into your abdomen. This expands your abdomen so that the surgeon has more room to perform the surgery.  Other tools needed for the procedure are inserted through the other incisions. The gallbladder is removed through one of the incisions.  After the removal of your gallbladder, the incisions will be closed with stitches, staples, or skin glue. AFTER THE PROCEDURE  You will be taken to a recovery area where your progress will be checked often.  You may be allowed to go home the same day if your pain is controlled and you can tolerate liquids. Document Released: 08/03/2005 Document Revised: 05/24/2013 Document Reviewed: 03/15/2013 Exit[employer] Patient Information 2015 [employer]. This information is not intended to replace advice given to you by your health care provider. Make   sure you discuss any questions you have with your health care provider.  [facility]. She will pre admit by phone on 03/16/14. Patient is aware of date and instructions.

## 2014-03-07 ENCOUNTER — Other Ambulatory Visit: Payer: Self-pay | Admitting: General Surgery

## 2014-03-07 DIAGNOSIS — K8020 Calculus of gallbladder without cholecystitis without obstruction: Secondary | ICD-10-CM | POA: Insufficient documentation

## 2014-03-27 ENCOUNTER — Ambulatory Visit: Payer: Self-pay | Admitting: General Surgery

## 2014-03-27 ENCOUNTER — Encounter: Payer: Self-pay | Admitting: General Surgery

## 2014-03-27 DIAGNOSIS — K8010 Calculus of gallbladder with chronic cholecystitis without obstruction: Secondary | ICD-10-CM

## 2014-03-27 HISTORY — PX: CHOLECYSTECTOMY: SHX55

## 2014-03-28 ENCOUNTER — Encounter: Payer: Self-pay | Admitting: General Surgery

## 2014-03-28 ENCOUNTER — Telehealth: Payer: Self-pay | Admitting: *Deleted

## 2014-03-28 LAB — PATHOLOGY REPORT

## 2014-03-28 NOTE — Telephone Encounter (Signed)
Patient calls this morning to let us know that the tramadol is not helping. Postop day 1. Reviewed postoperative progress, heating pad, tylenol etc. She states she is using tylenol and a heating pad. She would like something else to try for pain.

## 2014-03-29 ENCOUNTER — Encounter: Payer: Self-pay | Admitting: General Surgery

## 2014-04-03 ENCOUNTER — Encounter: Payer: Self-pay | Admitting: General Surgery

## 2014-04-03 ENCOUNTER — Ambulatory Visit (INDEPENDENT_AMBULATORY_CARE_PROVIDER_SITE_OTHER): Payer: Self-pay | Admitting: General Surgery

## 2014-04-03 VITALS — BP 124/70 | HR 76 | Resp 14 | Ht 65.0 in | Wt 196.0 lb

## 2014-04-03 DIAGNOSIS — K8020 Calculus of gallbladder without cholecystitis without obstruction: Secondary | ICD-10-CM

## 2014-04-03 MED ORDER — ONDANSETRON 4 MG PO TBDP: 4.0000 mg | ORAL_TABLET | Freq: Three times a day (TID) | ORAL | Status: DC

## 2014-04-03 NOTE — Patient Instructions (Signed)
The patient is aware to call back for any questions or concerns. Call with progress report in 10 days to let her know if the medications are helping

## 2014-04-03 NOTE — Progress Notes (Signed)
Patient ID: Robyn Ortiz, female   DOB: 04-08-1986, 28 y.o.   MRN: [id number]  Chief Complaint  Patient presents with  . Routine Post Op    laparoscopic cholecystectomy on 03-27-14    HPI Robyn Ortiz is a 28 y.o. female.  Here today for postoperative visit, laparoscopic cholecystectomy on 03-27-14. Bowels moving daily and loose. Nausea at mealtime, denies vomiting. She states she is having left abdominal pain that has been there since surgery, worse with moving arond.   HPI  Past Medical History  Diagnosis Date  . History of bronchitis 3666yrs ago  . Migraine   . History of bleeding ulcers     told not to take ibuprofen  . Obesity   . GERD (gastroesophageal reflux disease)     Past Surgical History  Procedure Laterality Date  . Tonsillectomy  03/10/2012    Procedure: TONSILLECTOMY;  Surgeon: Suzanna ObeyJohn Byers, MD;  [facility];  Service: ENT;  Laterality: Bilateral;  . Cholecystectomy  03-27-14    Dr Lemar LivingsByrnett    Family History  Problem Relation Age of Onset  . Diabetes Father   . Heart disease Paternal Grandmother   . Cancer Paternal Grandfather     intestional cancer  . Other Paternal Grandfather     deteriorating disc  . Bipolar disorder Mother     Social History History  Substance Use Topics  . Smoking status: Current Every Day Smoker -- 0.50 packs/day for 13 years    Types: Cigarettes  . Smokeless tobacco: Never Used  . Alcohol Use: Yes     Comment: beer occasionally    Allergies  Allergen Reactions  . Codeine Other (See Comments)    Pt. States she had a seizure  . Ibuprofen     Patient was diagnosed with bleeding ulcers and advised not to take this medication.    Current Outpatient Prescriptions  Medication Sig Dispense Refill  . cetirizine (ZYRTEC) 10 MG tablet Take 10 mg by mouth daily.      Marland Kitchen. etonogestrel (IMPLANON) 68 MG IMPL implant Inject 1 each into the skin once.      . ondansetron (ZOFRAN) 4 MG tablet Take 1 tablet (4 mg total) by mouth  every 6 (six) hours.  6 tablet  0  . pantoprazole (PROTONIX) 40 MG tablet Take 40 mg by mouth daily.      . phentermine 37.5 MG capsule Take 37.5 mg by mouth every morning.      . ondansetron (ZOFRAN ODT) 4 MG disintegrating tablet Take 1 tablet (4 mg total) by mouth 4 (four) times daily -  before meals and at bedtime.  30 tablet  0   No current facility-administered medications for this visit.    Review of Systems Review of Systems  Constitutional: Negative.   Respiratory: Negative.   Cardiovascular: Negative.     Blood pressure 124/70, pulse 76, resp. rate 14, height 5\' 5"  (1.651 m), weight 196 lb (88.905 kg), last menstrual period 02/14/2014.  Physical Exam Physical Exam  Constitutional: She is oriented to person, place, and time. She appears well-developed and well-nourished.  Neck: Neck supple.  Cardiovascular: Normal rate, regular rhythm and normal heart sounds.   Pulmonary/Chest: Effort normal and breath sounds normal.  Abdominal: Soft. Normal appearance and bowel sounds are normal.    Minimal bruising to abdomen  Lymphadenopathy:    She has no cervical adenopathy.  Neurological: She is alert and oriented to person, place, and time.  Skin: Skin is warm and dry.  Data Reviewed Pathology showed chronic cholecystitis and cholelithiasis  Assessment    Food avoidance secondary to nausea. Benign abdominal exam.     Plan    The patient has been asked to make uses Zofran about 30 minutes before meals. She'll increase her activity as tolerated. I anticipate the left flank pain will resolve.     Take Zofran before meals and call back with progress report to the nurse in 10 days.  PCP: Robyn Ortiz 04/03/2014, 8:46 PM

## 2014-04-24 ENCOUNTER — Encounter: Payer: Self-pay | Admitting: Obstetrics & Gynecology

## 2014-04-24 ENCOUNTER — Ambulatory Visit (INDEPENDENT_AMBULATORY_CARE_PROVIDER_SITE_OTHER): Payer: Medicaid Other | Admitting: Obstetrics & Gynecology

## 2014-04-24 VITALS — BP 100/66

## 2014-04-24 DIAGNOSIS — Z3046 Encounter for surveillance of implantable subdermal contraceptive: Secondary | ICD-10-CM

## 2014-04-24 DIAGNOSIS — Z319 Encounter for procreative management, unspecified: Secondary | ICD-10-CM

## 2014-04-24 NOTE — Progress Notes (Signed)
   Subjective:    Patient ID: Robyn Ortiz, female    DOB: 19-Mar-1986, 28 y.o.   MRN: [id number]  HPI  28 yo here today to have her Nexplanon removed. She wants another pregnancy. She is taking MVIs daily.  Review of Systems     Objective:   Physical Exam Consent was signed and time out was done. Her right arm was prepped with betadine after establishing the position of the Nexplanon. The area was infiltrated with 2 cc of 1% lidocaine. A small incision was made and the intact rod was easily removed. A steristrip was placed and her arm was noted to be hemostatic. It was bandaged.  She tolerated the procedure well.       Assessment & Plan:  Desire for pregnancy- RTC with pregnancy/prn sooner

## 2014-04-24 NOTE — Addendum Note (Signed)
Addended by: Barbara Cower on: 04/24/2014 02:12 PM   Modules accepted: Orders, SmartSet

## 2014-06-18 ENCOUNTER — Encounter: Payer: Self-pay | Admitting: Obstetrics & Gynecology

## 2014-07-24 ENCOUNTER — Other Ambulatory Visit (INDEPENDENT_AMBULATORY_CARE_PROVIDER_SITE_OTHER): Payer: Medicaid Other | Admitting: *Deleted

## 2014-07-24 DIAGNOSIS — Z3200 Encounter for pregnancy test, result unknown: Secondary | ICD-10-CM

## 2014-07-24 DIAGNOSIS — N912 Amenorrhea, unspecified: Secondary | ICD-10-CM

## 2014-07-24 NOTE — Patient Instructions (Signed)
Patient had a home pregnancy test that was lightly positive.  She would like blood drawn to confirm her pregnancy.

## 2014-07-24 NOTE — Addendum Note (Signed)
Addended by: Barbara CowerNOGUES, Danel Studzinski L on: 07/24/2014 04:34 PM   Modules accepted: Level of Service

## 2014-07-25 LAB — HCG, QUANTITATIVE, PREGNANCY: hCG, Beta Chain, Quant, S: 37.3 m[IU]/mL

## 2014-07-25 NOTE — Progress Notes (Signed)
Patient was notified, she just had a very faint positive home test and knew if she was pregnant it would be very early.  She will make an appointment for a new ob for about 5-6 weeks out so we can do an ultrasound.  She will call if she has any bleeding or other symptoms of concern.

## 2014-09-04 ENCOUNTER — Ambulatory Visit (INDEPENDENT_AMBULATORY_CARE_PROVIDER_SITE_OTHER): Payer: Medicaid Other | Admitting: Family Medicine

## 2014-09-04 ENCOUNTER — Other Ambulatory Visit (HOSPITAL_COMMUNITY)
Admission: RE | Admit: 2014-09-04 | Discharge: 2014-09-04 | Disposition: A | Discharge status: Home / Self Care | Payer: Medicaid Other | Source: Ambulatory Visit | Attending: Family Medicine | Admitting: Family Medicine

## 2014-09-04 ENCOUNTER — Encounter: Payer: Self-pay | Admitting: Family Medicine

## 2014-09-04 VITALS — BP 123/65 | HR 74 | Wt 209.6 lb

## 2014-09-04 DIAGNOSIS — Z113 Encounter for screening for infections with a predominantly sexual mode of transmission: Secondary | ICD-10-CM | POA: Diagnosis present

## 2014-09-04 DIAGNOSIS — Z01419 Encounter for gynecological examination (general) (routine) without abnormal findings: Secondary | ICD-10-CM | POA: Insufficient documentation

## 2014-09-04 DIAGNOSIS — L309 Dermatitis, unspecified: Secondary | ICD-10-CM

## 2014-09-04 DIAGNOSIS — Z3491 Encounter for supervision of normal pregnancy, unspecified, first trimester: Secondary | ICD-10-CM

## 2014-09-04 DIAGNOSIS — O99331 Smoking (tobacco) complicating pregnancy, first trimester: Secondary | ICD-10-CM

## 2014-09-04 DIAGNOSIS — F17210 Nicotine dependence, cigarettes, uncomplicated: Secondary | ICD-10-CM

## 2014-09-04 DIAGNOSIS — Z36 Encounter for antenatal screening of mother: Secondary | ICD-10-CM

## 2014-09-04 DIAGNOSIS — O480 Post-term pregnancy: Secondary | ICD-10-CM | POA: Insufficient documentation

## 2014-09-04 MED ORDER — TRIAMCINOLONE ACETONIDE 0.025 % EX OINT
1.0000 | TOPICAL_OINTMENT | Freq: Two times a day (BID) | CUTANEOUS | Status: DC
Start: 2014-09-04 — End: 2015-04-10

## 2014-09-04 NOTE — Patient Instructions (Addendum)
First Trimester of Pregnancy The first trimester of pregnancy is from week 1 until the end of week 12 (months 1 through 3). A week after a sperm fertilizes an egg, the egg will implant on the wall of the uterus. This embryo will begin to develop into a baby. Genes from you and your partner are forming the baby. The female genes determine whether the baby is a boy or a girl. At 6-8 weeks, the eyes and face are formed, and the heartbeat can be seen on ultrasound. At the end of 12 weeks, all the baby's organs are formed.  Now that you are pregnant, you will want to do everything you can to have a healthy baby. Two of the most important things are to get good prenatal care and to follow your health care provider's instructions. Prenatal care is all the medical care you receive before the baby's birth. This care will help prevent, find, and treat any problems during the pregnancy and childbirth. BODY CHANGES Your body goes through many changes during pregnancy. The changes vary from woman to woman.   You may gain or lose a couple of pounds at first.  You may feel sick to your stomach (nauseous) and throw up (vomit). If the vomiting is uncontrollable, call your health care provider.  You may tire easily.  You may develop headaches that can be relieved by medicines approved by your health care provider.  You may urinate more often. Painful urination may mean you have a bladder infection.  You may develop heartburn as a result of your pregnancy.  You may develop constipation because certain hormones are causing the muscles that push waste through your intestines to slow down.  You may develop hemorrhoids or swollen, bulging veins (varicose veins).  Your breasts may begin to grow larger and become tender. Your nipples may stick out more, and the tissue that surrounds them (areola) may become darker.  Your gums may bleed and may be sensitive to brushing and flossing.  Dark spots or blotches  (chloasma, mask of pregnancy) may develop on your face. This will likely fade after the baby is born.  Your menstrual periods will stop.  You may have a loss of appetite.  You may develop cravings for certain kinds of food.  You may have changes in your emotions from day to day, such as being excited to be pregnant or being concerned that something may go wrong with the pregnancy and baby.  You may have more vivid and strange dreams.  You may have changes in your hair. These can include thickening of your hair, rapid growth, and changes in texture. Some women also have hair loss during or after pregnancy, or hair that feels dry or thin. Your hair will most likely return to normal after your baby is born. WHAT TO EXPECT AT YOUR PRENATAL VISITS During a routine prenatal visit:  You will be weighed to make sure you and the baby are growing normally.  Your blood pressure will be taken.  Your abdomen will be measured to track your baby's growth.  The fetal heartbeat will be listened to starting around week 10 or 12 of your pregnancy.  Test results from any previous visits will be discussed. Your health care provider may ask you:  How you are feeling.  If you are feeling the baby move.  If you have had any abnormal symptoms, such as leaking fluid, bleeding, severe headaches, or abdominal cramping.  If you have any questions. Other tests   that may be performed during your first trimester include:  Blood tests to find your blood type and to check for the presence of any previous infections. They will also be used to check for low iron levels (anemia) and Rh antibodies. Later in the pregnancy, blood tests for diabetes will be done along with other tests if problems develop.  Urine tests to check for infections, diabetes, or protein in the urine.  An ultrasound to confirm the proper growth and development of the baby.  An amniocentesis to check for possible genetic problems.  Fetal  screens for spina bifida and Down syndrome.  You may need other tests to make sure you and the baby are doing well. HOME CARE INSTRUCTIONS  Medicines  Follow your health care provider's instructions regarding medicine use. Specific medicines may be either safe or unsafe to take during pregnancy.  Take your prenatal vitamins as directed.  If you develop constipation, try taking a stool softener if your health care provider approves. Diet  Eat regular, well-balanced meals. Choose a variety of foods, such as meat or vegetable-based protein, fish, milk and low-fat dairy products, vegetables, fruits, and whole grain breads and cereals. Your health care provider will help you determine the amount of weight gain that is right for you.  Avoid raw meat and uncooked cheese. These carry germs that can cause birth defects in the baby.  Eating four or five small meals rather than three large meals a day may help relieve nausea and vomiting. If you start to feel nauseous, eating a few soda crackers can be helpful. Drinking liquids between meals instead of during meals also seems to help nausea and vomiting.  If you develop constipation, eat more high-fiber foods, such as fresh vegetables or fruit and whole grains. Drink enough fluids to keep your urine clear or pale yellow. Activity and Exercise  Exercise only as directed by your health care provider. Exercising will help you:  Control your weight.  Stay in shape.  Be prepared for labor and delivery.  Experiencing pain or cramping in the lower abdomen or low back is a good sign that you should stop exercising. Check with your health care provider before continuing normal exercises.  Try to avoid standing for long periods of time. Move your legs often if you must stand in one place for a long time.  Avoid heavy lifting.  Wear low-heeled shoes, and practice good posture.  You may continue to have sex unless your health care provider directs you  otherwise. Relief of Pain or Discomfort  Wear a good support bra for breast tenderness.   Take warm sitz baths to soothe any pain or discomfort caused by hemorrhoids. Use hemorrhoid cream if your health care provider approves.   Rest with your legs elevated if you have leg cramps or low back pain.  If you develop varicose veins in your legs, wear support hose. Elevate your feet for 15 minutes, 3-4 times a day. Limit salt in your diet. Prenatal Care  Schedule your prenatal visits by the twelfth week of pregnancy. They are usually scheduled monthly at first, then more often in the last 2 months before delivery.  Write down your questions. Take them to your prenatal visits.  Keep all your prenatal visits as directed by your health care provider. Safety  Wear your seat belt at all times when driving.  Make a list of emergency phone numbers, including numbers for family, friends, the hospital, and police and fire departments. General Tips    Ask your health care provider for a referral to a local prenatal education class. Begin classes no later than at the beginning of month 6 of your pregnancy.  Ask for help if you have counseling or nutritional needs during pregnancy. Your health care provider can offer advice or refer you to specialists for help with various needs.  Do not use hot tubs, steam rooms, or saunas.  Do not douche or use tampons or scented sanitary pads.  Do not cross your legs for long periods of time.  Avoid cat litter boxes and soil used by cats. These carry germs that can cause birth defects in the baby and possibly loss of the fetus by miscarriage or stillbirth.  Avoid all smoking, herbs, alcohol, and medicines not prescribed by your health care provider. Chemicals in these affect the formation and growth of the baby.  Schedule a dentist appointment. At home, brush your teeth with a soft toothbrush and be gentle when you floss. SEEK MEDICAL CARE IF:   You have  dizziness.  You have mild pelvic cramps, pelvic pressure, or nagging pain in the abdominal area.  You have persistent nausea, vomiting, or diarrhea.  You have a bad smelling vaginal discharge.  You have pain with urination.  You notice increased swelling in your face, hands, legs, or ankles. SEEK IMMEDIATE MEDICAL CARE IF:   You have a fever.  You are leaking fluid from your vagina.  You have spotting or bleeding from your vagina.  You have severe abdominal cramping or pain.  You have rapid weight gain or loss.  You vomit blood or material that looks like coffee grounds.  You are exposed to [place] measles and have never had them.  You are exposed to fifth disease or chickenpox.  You develop a severe headache.  You have shortness of breath.  You have any kind of trauma, such as from a fall or a car accident. Document Released: 07/28/2001 Document Revised: 12/18/2013 Document Reviewed: 06/13/2013 [facility] Patient Information 2015 [place], [place]. This information is not intended to replace advice given to you by your health care provider. Make sure you discuss any questions you have with your health care provider.  Breastfeeding Deciding to breastfeed is one of the [place] choices you can make for you and your baby. A change in hormones during pregnancy causes your breast tissue to grow and increases the number and size of your milk ducts. These hormones also allow proteins, sugars, and fats from your blood supply to make breast milk in your milk-producing glands. Hormones prevent breast milk from being released before your baby is born as well as prompt milk flow after birth. Once breastfeeding has begun, thoughts of your baby, as well as his or her sucking or crying, can stimulate the release of milk from your milk-producing glands.  BENEFITS OF BREASTFEEDING For Your Baby  Your first milk (colostrum) helps your baby's digestive system function better.   There are antibodies  in your milk that help your baby fight off infections.   Your baby has a lower incidence of asthma, allergies, and sudden infant death syndrome.   The nutrients in breast milk are better for your baby than infant formulas and are designed uniquely for your baby's needs.   Breast milk improves your baby's brain development.   Your baby is less likely to develop other conditions, such as childhood obesity, asthma, or type 2 diabetes mellitus.  For You   Breastfeeding helps to create a very special bond between  you and your baby.   Breastfeeding is convenient. Breast milk is always available at the correct temperature and costs nothing.   Breastfeeding helps to burn calories and helps you lose the weight gained during pregnancy.   Breastfeeding makes your uterus contract to its prepregnancy size faster and slows bleeding (lochia) after you give birth.   Breastfeeding helps to lower your risk of developing type 2 diabetes mellitus, osteoporosis, and breast or ovarian cancer later in life. SIGNS THAT YOUR BABY IS HUNGRY Early Signs of Hunger  Increased alertness or activity.  Stretching.  Movement of the head from side to side.  Movement of the head and opening of the mouth when the corner of the mouth or cheek is stroked (rooting).  Increased sucking sounds, smacking lips, cooing, sighing, or squeaking.  Hand-to-mouth movements.  Increased sucking of fingers or hands. Late Signs of Hunger  Fussing.  Intermittent crying. Extreme Signs of Hunger Signs of extreme hunger will require calming and consoling before your baby will be able to breastfeed successfully. Do not wait for the following signs of extreme hunger to occur before you initiate breastfeeding:   Restlessness.  A loud, strong cry.   Screaming. BREASTFEEDING BASICS Breastfeeding Initiation  Find a comfortable place to sit or lie down, with your neck and back well supported.  Place a pillow or  rolled up blanket under your baby to bring him or her to the level of your breast (if you are seated). Nursing pillows are specially designed to help support your arms and your baby while you breastfeed.  Make sure that your baby's abdomen is facing your abdomen.   Gently massage your breast. With your fingertips, massage from your chest wall toward your nipple in a circular motion. This encourages milk flow. You may need to continue this action during the feeding if your milk flows slowly.  Support your breast with 4 fingers underneath and your thumb above your nipple. Make sure your fingers are well away from your nipple and your baby's mouth.   Stroke your baby's lips gently with your finger or nipple.   When your baby's mouth is open wide enough, quickly bring your baby to your breast, placing your entire nipple and as much of the colored area around your nipple (areola) as possible into your baby's mouth.   More areola should be visible above your baby's upper lip than below the lower lip.   Your baby's tongue should be between his or her lower gum and your breast.   Ensure that your baby's mouth is correctly positioned around your nipple (latched). Your baby's lips should create a seal on your breast and be turned out (everted).  It is common for your baby to suck about 2-3 minutes in order to start the flow of breast milk. Latching Teaching your baby how to latch on to your breast properly is very important. An improper latch can cause nipple pain and decreased milk supply for you and poor weight gain in your baby. Also, if your baby is not latched onto your nipple properly, he or she may swallow some air during feeding. This can make your baby fussy. Burping your baby when you switch breasts during the feeding can help to get rid of the air. However, teaching your baby to latch on properly is still the best way to prevent fussiness from swallowing air while breastfeeding. Signs  that your baby has successfully latched on to your nipple:    Silent tugging or silent   sucking, without causing you pain.   Swallowing heard between every 3-4 sucks.    Muscle movement above and in front of his or her ears while sucking.  Signs that your baby has not successfully latched on to nipple:   Sucking sounds or smacking sounds from your baby while breastfeeding.  Nipple pain. If you think your baby has not latched on correctly, slip your finger into the corner of your baby's mouth to break the suction and place it between your baby's gums. Attempt breastfeeding initiation again. Signs of Successful Breastfeeding Signs from your baby:   A gradual decrease in the number of sucks or complete cessation of sucking.   Falling asleep.   Relaxation of his or her body.   Retention of a small amount of milk in his or her mouth.   Letting go of your breast by himself or herself. Signs from you:  Breasts that have increased in firmness, weight, and size 1-3 hours after feeding.   Breasts that are softer immediately after breastfeeding.  Increased milk volume, as well as a change in milk consistency and color by the fifth day of breastfeeding.   Nipples that are not sore, cracked, or bleeding. Signs That Your Baby is Getting Enough Milk  Wetting at least 3 diapers in a 24-hour period. The urine should be clear and pale yellow by age 5 days.  At least 3 stools in a 24-hour period by age 5 days. The stool should be soft and yellow.  At least 3 stools in a 24-hour period by age 7 days. The stool should be seedy and yellow.  No loss of weight greater than 10% of birth weight during the first 3 days of age.  Average weight gain of 4-7 ounces (113-198 g) per week after age 4 days.  Consistent daily weight gain by age 5 days, without weight loss after the age of 2 weeks. After a feeding, your baby may spit up a small amount. This is common. BREASTFEEDING FREQUENCY AND  DURATION Frequent feeding will help you make more milk and can prevent sore nipples and breast engorgement. Breastfeed when you feel the need to reduce the fullness of your breasts or when your baby shows signs of hunger. This is called "breastfeeding on demand." Avoid introducing a pacifier to your baby while you are working to establish breastfeeding (the first 4-6 weeks after your baby is born). After this time you may choose to use a pacifier. Research has shown that pacifier use during the first year of a baby's life decreases the risk of sudden infant death syndrome (SIDS). Allow your baby to feed on each breast as long as he or she wants. Breastfeed until your baby is finished feeding. When your baby unlatches or falls asleep while feeding from the first breast, offer the second breast. Because newborns are often sleepy in the first few weeks of life, you may need to awaken your baby to get him or her to feed. Breastfeeding times will vary from baby to baby. However, the following rules can serve as a guide to help you ensure that your baby is properly fed:  Newborns (babies 4 weeks of age or younger) may breastfeed every 1-3 hours.  Newborns should not go longer than 3 hours during the day or 5 hours during the night without breastfeeding.  You should breastfeed your baby a minimum of 8 times in a 24-hour period until you begin to introduce solid foods to your baby at around 6   months of age. BREAST MILK PUMPING Pumping and storing breast milk allows you to ensure that your baby is exclusively fed your breast milk, even at times when you are unable to breastfeed. This is especially important if you are going back to work while you are still breastfeeding or when you are not able to be present during feedings. Your lactation consultant can give you guidelines on how long it is safe to store breast milk.  A breast pump is a machine that allows you to pump milk from your breast into a sterile bottle.  The pumped breast milk can then be stored in a refrigerator or freezer. Some breast pumps are operated by hand, while others use electricity. Ask your lactation consultant which type will work [place] for you. Breast pumps can be purchased, but some hospitals and breastfeeding support groups lease breast pumps on a monthly basis. A lactation consultant can teach you how to hand express breast milk, if you prefer not to use a pump.  CARING FOR YOUR BREASTS WHILE YOU BREASTFEED Nipples can become dry, cracked, and sore while breastfeeding. The following recommendations can help keep your breasts moisturized and healthy:  Avoid using soap on your nipples.   Wear a supportive bra. Although not required, special nursing bras and tank tops are designed to allow access to your breasts for breastfeeding without taking off your entire bra or top. Avoid wearing underwire-style bras or extremely tight bras.  Air dry your nipples for 3-31mnutes after each feeding.   Use only cotton bra pads to absorb leaked breast milk. Leaking of breast milk between feedings is normal.   Use lanolin on your nipples after breastfeeding. Lanolin helps to maintain your skin's normal moisture barrier. If you use pure lanolin, you do not need to wash it off before feeding your baby again. Pure lanolin is not toxic to your baby. You may also hand express a few drops of breast milk and gently massage that milk into your nipples and allow the milk to air dry. In the first few weeks after giving birth, some women experience extremely full breasts (engorgement). Engorgement can make your breasts feel heavy, warm, and tender to the touch. Engorgement peaks within 3-5 days after you give birth. The following recommendations can help ease engorgement:  Completely empty your breasts while breastfeeding or pumping. You may want to start by applying warm, moist heat (in the shower or with warm water-soaked hand towels) just before feeding or  pumping. This increases circulation and helps the milk flow. If your baby does not completely empty your breasts while breastfeeding, pump any extra milk after he or she is finished.  Wear a snug bra (nursing or regular) or tank top for 1-2 days to signal your body to slightly decrease milk production.  Apply ice packs to your breasts, unless this is too uncomfortable for you.  Make sure that your baby is latched on and positioned properly while breastfeeding. If engorgement persists after 48 hours of following these recommendations, contact your health care provider or a lScience writer OVERALL HEALTH CARE RECOMMENDATIONS WHILE BREASTFEEDING  Eat healthy foods. Alternate between meals and snacks, eating 3 of each per day. Because what you eat affects your breast milk, some of the foods may make your baby more irritable than usual. Avoid eating these foods if you are sure that they are negatively affecting your baby.  Drink milk, fruit juice, and water to satisfy your thirst (about 10 glasses a day).   Rest  often, relax, and continue to take your prenatal vitamins to prevent fatigue, stress, and anemia.  Continue breast self-awareness checks.  Avoid chewing and smoking tobacco.  Avoid alcohol and drug use. Some medicines that may be harmful to your baby can pass through breast milk. It is important to ask your health care provider before taking any medicine, including all over-the-counter and prescription medicine as well as vitamin and herbal supplements. It is possible to become pregnant while breastfeeding. If birth control is desired, ask your health care provider about options that will be safe for your baby. SEEK MEDICAL CARE IF:   You feel like you want to stop breastfeeding or have become frustrated with breastfeeding.  You have painful breasts or nipples.  Your nipples are cracked or bleeding.  Your breasts are red, tender, or warm.  You have a swollen area on either  breast.  You have a fever or chills.  You have nausea or vomiting.  You have drainage other than breast milk from your nipples.  Your breasts do not become full before feedings by the fifth day after you give birth.  You feel sad and depressed.  Your baby is too sleepy to eat well.  Your baby is having trouble sleeping.   Your baby is wetting less than 3 diapers in a 24-hour period.  Your baby has less than 3 stools in a 24-hour period.  Your baby's skin or the white part of his or her eyes becomes yellow.   Your baby is not gaining weight by 57 days of age. SEEK IMMEDIATE MEDICAL CARE IF:   Your baby is overly tired (lethargic) and does not want to wake up and feed.  Your baby develops an unexplained fever. Document Released: 08/03/2005 Document Revised: 08/08/2013 Document Reviewed: 01/25/2013 [facility] Patient Information 2015 [place], [place]. This information is not intended to replace advice given to you by your health care provider. Make sure you discuss any questions you have with your health care provider. Smoking Cessation Quitting smoking is important to your health and has many advantages. However, it is not always easy to quit since nicotine is a very addictive drug. Oftentimes, people try 3 times or more before being able to quit. This document explains the best ways for you to prepare to quit smoking. Quitting takes hard work and a lot of effort, but you can do it. ADVANTAGES OF QUITTING SMOKING  You will live longer, feel better, and live better.  Your body will feel the impact of quitting smoking almost immediately.  Within 20 minutes, blood pressure decreases. Your pulse returns to its normal level.  After 8 hours, carbon monoxide levels in the blood return to normal. Your oxygen level increases.  After 24 hours, the chance of having a heart attack starts to decrease. Your breath, hair, and body stop smelling like smoke.  After 48 hours, damaged nerve  endings begin to recover. Your sense of taste and smell improve.  After 72 hours, the body is virtually free of nicotine. Your bronchial tubes relax and breathing becomes easier.  After 2 to 12 weeks, lungs can hold more air. Exercise becomes easier and circulation improves.  The risk of having a heart attack, stroke, cancer, or lung disease is greatly reduced.  After 1 year, the risk of coronary heart disease is cut in half.  After 5 years, the risk of stroke falls to the same as a nonsmoker.  After 10 years, the risk of lung cancer is cut in half  and the risk of other cancers decreases significantly.  After 15 years, the risk of coronary heart disease drops, usually to the level of a nonsmoker.  If you are pregnant, quitting smoking will improve your chances of having a healthy baby.  The people you live with, especially any children, will be healthier.  You will have extra money to spend on things other than cigarettes. QUESTIONS TO THINK ABOUT BEFORE ATTEMPTING TO QUIT You may want to talk about your answers with your health care provider.  Why do you want to quit?  If you tried to quit in the past, what helped and what did not?  What will be the most difficult situations for you after you quit? How will you plan to handle them?  Who can help you through the tough times? Your family? Friends? A health care provider?  What pleasures do you get from smoking? What ways can you still get pleasure if you quit? Here are some questions to ask your health care provider:  How can you help me to be successful at quitting?  What medicine do you think would be best for me and how should I take it?  What should I do if I need more help?  What is smoking withdrawal like? How can I get information on withdrawal? GET READY  Set a quit date.  Change your environment by getting rid of all cigarettes, ashtrays, matches, and lighters in your home, car, or work. Do not let people smoke  in your home.  Review your past attempts to quit. Think about what worked and what did not. GET SUPPORT AND ENCOURAGEMENT You have a better chance of being successful if you have help. You can get support in many ways.  Tell your family, friends, and coworkers that you are going to quit and need their support. Ask them not to smoke around you.  Get individual, group, or telephone counseling and support. Programs are available at [facility] and health [facility]. Call your local health department for information about programs in your area.  Spiritual beliefs and practices may help some smokers quit.  Download a "quit meter" on your computer to keep track of quit statistics, such as how long you have gone without smoking, cigarettes not smoked, and money saved.  Get a self-help book about quitting smoking and staying off tobacco. LEARN NEW SKILLS AND BEHAVIORS  Distract yourself from urges to smoke. Talk to someone, go for a walk, or occupy your time with a task.  Change your normal routine. Take a different route to work. Drink tea instead of coffee. Eat breakfast in a different place.  Reduce your stress. Take a hot bath, exercise, or read a book.  Plan something enjoyable to do every day. Reward yourself for not smoking.  Explore interactive web-based programs that specialize in helping you quit. GET MEDICINE AND USE IT CORRECTLY Medicines can help you stop smoking and decrease the urge to smoke. Combining medicine with the above behavioral methods and support can greatly increase your chances of successfully quitting smoking.  Nicotine replacement therapy helps deliver nicotine to your body without the negative effects and risks of smoking. Nicotine replacement therapy includes nicotine gum, lozenges, inhalers, nasal sprays, and skin patches. Some may be available over-the-counter and others require a prescription.  Antidepressant medicine helps people abstain from smoking, but  how this works is unknown. This medicine is available by prescription.  Nicotinic receptor partial agonist medicine simulates the effect of nicotine in your  brain. This medicine is available by prescription. Ask your health care provider for advice about which medicines to use and how to use them based on your health history. Your health care provider will tell you what side effects to look out for if you choose to be on a medicine or therapy. Carefully read the information on the package. Do not use any other product containing nicotine while using a nicotine replacement product.  RELAPSE OR DIFFICULT SITUATIONS Most relapses occur within the first 3 months after quitting. Do not be discouraged if you start smoking again. Remember, most people try several times before finally quitting. You may have symptoms of withdrawal because your body is used to nicotine. You may crave cigarettes, be irritable, feel very hungry, cough often, get headaches, or have difficulty concentrating. The withdrawal symptoms are only temporary. They are strongest when you first quit, but they will go away within 10-14 days. To reduce the chances of relapse, try to:  Avoid drinking alcohol. Drinking lowers your chances of successfully quitting.  Reduce the amount of caffeine you consume. Once you quit smoking, the amount of caffeine in your body increases and can give you symptoms, such as a rapid heartbeat, sweating, and anxiety.  Avoid smokers because they can make you want to smoke.  Do not let weight gain distract you. Many smokers will gain weight when they quit, usually less than 10 pounds. Eat a healthy diet and stay active. You can always lose the weight gained after you quit.  Find ways to improve your mood other than smoking. FOR MORE INFORMATION  www.smokefree.gov  Document Released: 07/28/2001 Document Revised: 12/18/2013 Document Reviewed: 11/12/2011 [facility] Patient Information 2015 [place], [place]. This  information is not intended to replace advice given to you by your health care provider. Make sure you discuss any questions you have with your health care provider. Eczema Eczema, also called atopic dermatitis, is a skin disorder that causes inflammation of the skin. It causes a red rash and dry, scaly skin. The skin becomes very itchy. Eczema is generally worse during the cooler winter months and often improves with the warmth of summer. Eczema usually starts showing signs in infancy. Some children outgrow eczema, but it may last through adulthood.  CAUSES  The exact cause of eczema is not known, but it appears to run in families. People with eczema often have a family history of eczema, allergies, asthma, or hay fever. Eczema is not contagious. Flare-ups of the condition may be caused by:   Contact with something you are sensitive or allergic to.   Stress. SIGNS AND SYMPTOMS  Dry, scaly skin.   Red, itchy rash.   Itchiness. This may occur before the skin rash and may be very intense.  DIAGNOSIS  The diagnosis of eczema is usually made based on symptoms and medical history. TREATMENT  Eczema cannot be cured, but symptoms usually can be controlled with treatment and other strategies. A treatment plan might include:  Controlling the itching and scratching.   Use over-the-counter antihistamines as directed for itching. This is especially useful at night when the itching tends to be worse.   Use over-the-counter steroid creams as directed for itching.   Avoid scratching. Scratching makes the rash and itching worse. It may also result in a skin infection (impetigo) due to a break in the skin caused by scratching.   Keeping the skin well moisturized with creams every day. This will seal in moisture and help prevent dryness. Lotions that contain alcohol  and water should be avoided because they can dry the skin.   Limiting exposure to things that you are sensitive or allergic to  (allergens).   Recognizing situations that cause stress.   Developing a plan to manage stress.  HOME CARE INSTRUCTIONS   Only take over-the-counter or prescription medicines as directed by your health care provider.   Do not use anything on the skin without checking with your health care provider.   Keep baths or showers short (5 minutes) in warm (not hot) water. Use mild cleansers for bathing. These should be unscented. You may add nonperfumed bath oil to the bath water. It is best to avoid soap and bubble bath.   Immediately after a bath or shower, when the skin is still damp, apply a moisturizing ointment to the entire body. This ointment should be a petroleum ointment. This will seal in moisture and help prevent dryness. The thicker the ointment, the better. These should be unscented.   Keep fingernails cut short. Children with eczema may need to wear soft gloves or mittens at night after applying an ointment.   Dress in clothes made of cotton or cotton blends. Dress lightly, because heat increases itching.   A child with eczema should stay away from anyone with fever blisters or cold sores. The virus that causes fever blisters (herpes simplex) can cause a serious skin infection in children with eczema. SEEK MEDICAL CARE IF:   Your itching interferes with sleep.   Your rash gets worse or is not better within 1 week after starting treatment.   You see pus or soft yellow scabs in the rash area.   You have a fever.   You have a rash flare-up after contact with someone who has fever blisters.  Document Released: 07/31/2000 Document Revised: 05/24/2013 Document Reviewed: 03/06/2013 [facility] Patient Information 2015 [place], [place]. This information is not intended to replace advice given to you by your health care provider. Make sure you discuss any questions you have with your health care provider.

## 2014-09-04 NOTE — Progress Notes (Signed)
Last pap 2014.  Patients eczema is starting to return as well.  Bedside ultrasound shows CRL of 9wk4days.  Positive fetal heart rate heard.

## 2014-09-04 NOTE — Progress Notes (Signed)
Subjective:    Robyn Ortiz is a [id number] [redacted]w[redacted]d being seen today for her first obstetrical visit.  Her obstetrical history is not significant. Pregnancy history fully reviewed.  Patient reports no complaints.  Filed Vitals:   09/04/14 1326  BP: 123/65  Pulse: 74  Weight: 209 lb 9.6 oz (95.074 kg)    HISTORY: OB History  Gravida Para Term Preterm AB SAB TAB Ectopic Multiple Living  # Outcome Date GA Lbr Len/2nd Weight Sex Delivery Anes PTL Lv  3 Current           2 Term 06/17/13 [redacted]w[redacted]d 10:15 / 00:24 5 lb 15.8 oz (2.716 kg) M Vag-Spont EPI  Y  1 Term 11/01/03 [redacted]w[redacted]d  7 lb 9 oz (3.43 kg) F Vag-Spont EPI N Y     Comments: Patient feels like her due date was estimated wrong and she went way past her due date.    Obstetric Comments  1st Menstrual Cycle:  14   1st Pregnancy:  17   Past Medical History  Diagnosis Date  . History of bronchitis 7yrs ago  . Migraine   . History of bleeding ulcers     told not to take ibuprofen  . Obesity   . GERD (gastroesophageal reflux disease)    Past Surgical History  Procedure Laterality Date  . Tonsillectomy  03/10/2012    Procedure: TONSILLECTOMY;  Surgeon: Suzanna Obey, MD;  Location: [facility] OR;  Service: ENT;  Laterality: Bilateral;  . Cholecystectomy  03-27-14    Dr Lemar Livings   Family History  Problem Relation Age of Onset  . Diabetes Father   . Heart disease Paternal Grandmother   . Cancer Paternal Grandfather     intestional cancer  . Other Paternal Grandfather     deteriorating disc  . Bipolar disorder Mother      Exam    Uterus:   10 wk size  Pelvic Exam:    Perineum: Normal Perineum   Vulva: Bartholin's, Urethra, Skene's normal   Vagina:  normal mucosa, normal discharge   Cervix: multiparous appearance   Adnexa: normal adnexa   Bony Pelvis: average  System: Breast:  normal appearance, no masses or tenderness   Skin: normal coloration and turgor, no rashes    Neurologic: oriented   Extremities:  normal strength, tone, and muscle mass   HEENT sclera clear, anicteric   Mouth/Teeth mucous membranes moist, pharynx normal without lesions and dental hygiene good   Neck supple   Cardiovascular: regular rate and rhythm, no murmurs or gallops   Respiratory:  appears well, vitals normal, no respiratory distress, acyanotic, normal RR, ear and throat exam is normal, neck free of mass or lymphadenopathy, chest clear, no wheezing, crepitations, rhonchi, normal symmetric air entry   Abdomen: soft, non-tender; bowel sounds normal; no masses,  no organomegaly      Assessment:    Pregnancy: [id number] Patient Active Problem List   Diagnosis Date Noted  . Supervision of normal pregnancy 09/04/2014    Priority: High  . H/O postpartum depression, currently pregnant 06/27/2013  . Obesity (BMI 30-39.9) 11/21/2012  . Asthma 11/21/2012  . Current smoker 11/21/2012  . Condyloma acuminatum of perianal region 11/21/2012  . Migraine 11/21/2012        Plan:     Initial labs drawn. Prenatal vitamins. Problem list reviewed and updated. Genetic Screening discussed First Screen: ordered.  Ultrasound discussed; fetal survey: discussed.  Follow  up in 4 weeks.    Dung Salinger S 09/04/2014

## 2014-09-05 LAB — CYTOLOGY - PAP

## 2014-09-05 LAB — PRENATAL PROFILE (SOLSTAS)
ANTIBODY SCREEN: NEGATIVE
Basophils Absolute: 0 10*3/uL (ref 0.0–0.1)
Basophils Relative: 0 % (ref 0–1)
Eosinophils Absolute: 0.2 10*3/uL (ref 0.0–0.7)
Eosinophils Relative: 2 % (ref 0–5)
HEMATOCRIT: 37.5 % (ref 36.0–46.0)
HEMOGLOBIN: 12.9 g/dL (ref 12.0–15.0)
HEP B S AG: NEGATIVE
HIV 1&2 Ab, 4th Generation: NONREACTIVE
Lymphocytes Relative: 23 % (ref 12–46)
Lymphs Abs: 1.9 10*3/uL (ref 0.7–4.0)
MCH: 31.2 pg (ref 26.0–34.0)
MCHC: 34.4 g/dL (ref 30.0–36.0)
MCV: 90.8 fL (ref 78.0–100.0)
MONOS PCT: 7 % (ref 3–12)
MPV: 9.6 fL (ref 8.6–12.4)
Monocytes Absolute: 0.6 10*3/uL (ref 0.1–1.0)
NEUTROS PCT: 68 % (ref 43–77)
Neutro Abs: 5.6 10*3/uL (ref 1.7–7.7)
Platelets: 280 10*3/uL (ref 150–400)
RBC: 4.13 MIL/uL (ref 3.87–5.11)
RDW: 12.8 % (ref 11.5–15.5)
Rh Type: POSITIVE
Rubella: 1.62 Index — ABNORMAL HIGH (ref <0.90)
WBC: 8.3 10*3/uL (ref 4.0–10.5)

## 2014-09-06 LAB — CULTURE, OB URINE: Colony Count: 60000

## 2014-09-25 ENCOUNTER — Encounter: Payer: Self-pay | Admitting: *Deleted

## 2014-09-25 ENCOUNTER — Telehealth: Payer: Self-pay | Admitting: *Deleted

## 2014-09-25 MED ORDER — DOXYLAMINE-PYRIDOXINE 10-10 MG PO TBEC: DELAYED_RELEASE_TABLET | ORAL | Status: DC

## 2014-09-25 NOTE — Telephone Encounter (Signed)
Patient is requesting a medication for nausea.

## 2014-10-02 ENCOUNTER — Ambulatory Visit (INDEPENDENT_AMBULATORY_CARE_PROVIDER_SITE_OTHER): Payer: Medicaid Other | Admitting: Obstetrics & Gynecology

## 2014-10-02 VITALS — BP 111/68 | HR 83 | Wt 212.0 lb

## 2014-10-02 DIAGNOSIS — Z3492 Encounter for supervision of normal pregnancy, unspecified, second trimester: Secondary | ICD-10-CM

## 2014-10-02 NOTE — Progress Notes (Addendum)
NT scan ordered but was not scheduled at initial visit, will try to schedule in next couple of days.   If not successful, will draw quad screen next visit Anatomy scan ordered and scheduled No other complaints or concerns.  Routine obstetric precautions reviewed.  10/03/14 Addendum:  NT scan done today; normal NT and NB, first trimester screen labs done at [facility]. Will follow up results, check AFP only lab at next visit here in clinic.

## 2014-10-02 NOTE — Patient Instructions (Signed)
Second Trimester of Pregnancy The second trimester is from week 13 through week 28, months 4 through 6. The second trimester is often a time when you feel your best. Your body has also adjusted to being pregnant, and you begin to feel better physically. Usually, morning sickness has lessened or quit completely, you may have more energy, and you may have an increase in appetite. The second trimester is also a time when the fetus is growing rapidly. At the end of the sixth month, the fetus is about 9 inches long and weighs about 1 pounds. You will likely begin to feel the baby move (quickening) between 18 and 20 weeks of the pregnancy. BODY CHANGES Your body goes through many changes during pregnancy. The changes vary from woman to woman.   Your weight will continue to increase. You will notice your lower abdomen bulging out.  You may begin to get stretch marks on your hips, abdomen, and breasts.  You may develop headaches that can be relieved by medicines approved by your health care provider.  You may urinate more often because the fetus is pressing on your bladder.  You may develop or continue to have heartburn as a result of your pregnancy.  You may develop constipation because certain hormones are causing the muscles that push waste through your intestines to slow down.  You may develop hemorrhoids or swollen, bulging veins (varicose veins).  You may have back pain because of the weight gain and pregnancy hormones relaxing your joints between the bones in your pelvis and as a result of a shift in weight and the muscles that support your balance.  Your breasts will continue to grow and be tender.  Your gums may bleed and may be sensitive to brushing and flossing.  Dark spots or blotches (chloasma, mask of pregnancy) may develop on your face. This will likely fade after the baby is born.  A dark line from your belly button to the pubic area (linea nigra) may appear. This will likely fade  after the baby is born.  You may have changes in your hair. These can include thickening of your hair, rapid growth, and changes in texture. Some women also have hair loss during or after pregnancy, or hair that feels dry or thin. Your hair will most likely return to normal after your baby is born. WHAT TO EXPECT AT YOUR PRENATAL VISITS During a routine prenatal visit:  You will be weighed to make sure you and the fetus are growing normally.  Your blood pressure will be taken.  Your abdomen will be measured to track your baby's growth.  The fetal heartbeat will be listened to.  Any test results from the previous visit will be discussed. Your health care provider may ask you:  How you are feeling.  If you are feeling the baby move.  If you have had any abnormal symptoms, such as leaking fluid, bleeding, severe headaches, or abdominal cramping.  If you have any questions. Other tests that may be performed during your second trimester include:  Blood tests that check for:  Low iron levels (anemia).  Gestational diabetes (between 24 and 28 weeks).  Rh antibodies.  Urine tests to check for infections, diabetes, or protein in the urine.  An ultrasound to confirm the proper growth and development of the baby.  An amniocentesis to check for possible genetic problems.  Fetal screens for spina bifida and Down syndrome. HOME CARE INSTRUCTIONS   Avoid all smoking, herbs, alcohol, and unprescribed   drugs. These chemicals affect the formation and growth of the baby.  Follow your health care provider's instructions regarding medicine use. There are medicines that are either safe or unsafe to take during pregnancy.  Exercise only as directed by your health care provider. Experiencing uterine cramps is a good sign to stop exercising.  Continue to eat regular, healthy meals.  Wear a good support bra for breast tenderness.  Do not use hot tubs, steam rooms, or saunas.  Wear your  seat belt at all times when driving.  Avoid raw meat, uncooked cheese, cat litter boxes, and soil used by cats. These carry germs that can cause birth defects in the baby.  Take your prenatal vitamins.  Try taking a stool softener (if your health care provider approves) if you develop constipation. Eat more high-fiber foods, such as fresh vegetables or fruit and whole grains. Drink plenty of fluids to keep your urine clear or pale yellow.  Take warm sitz baths to soothe any pain or discomfort caused by hemorrhoids. Use hemorrhoid cream if your health care provider approves.  If you develop varicose veins, wear support hose. Elevate your feet for 15 minutes, 3-4 times a day. Limit salt in your diet.  Avoid heavy lifting, wear low heel shoes, and practice good posture.  Rest with your legs elevated if you have leg cramps or low back pain.  Visit your dentist if you have not gone yet during your pregnancy. Use a soft toothbrush to brush your teeth and be gentle when you floss.  A sexual relationship may be continued unless your health care provider directs you otherwise.  Continue to go to all your prenatal visits as directed by your health care provider. SEEK MEDICAL CARE IF:   You have dizziness.  You have mild pelvic cramps, pelvic pressure, or nagging pain in the abdominal area.  You have persistent nausea, vomiting, or diarrhea.  You have a bad smelling vaginal discharge.  You have pain with urination. SEEK IMMEDIATE MEDICAL CARE IF:   You have a fever.  You are leaking fluid from your vagina.  You have spotting or bleeding from your vagina.  You have severe abdominal cramping or pain.  You have rapid weight gain or loss.  You have shortness of breath with chest pain.  You notice sudden or extreme swelling of your face, hands, ankles, feet, or legs.  You have not felt your baby move in over an hour.  You have severe headaches that do not go away with  medicine.  You have vision changes. Document Released: 07/28/2001 Document Revised: 08/08/2013 Document Reviewed: 10/04/2012 [employer] Patient Information 2015 [employer]. This information is not intended to replace advice given to you by your health care provider. Make sure you discuss any questions you have with your health care provider.  

## 2014-10-03 ENCOUNTER — Ambulatory Visit (HOSPITAL_COMMUNITY)
Admission: RE | Admit: 2014-10-03 | Discharge: 2014-10-03 | Disposition: A | Discharge status: Home / Self Care | Payer: Medicaid Other | Source: Ambulatory Visit | Attending: Family Medicine | Admitting: Family Medicine

## 2014-10-03 ENCOUNTER — Encounter (HOSPITAL_COMMUNITY): Payer: Self-pay

## 2014-10-03 ENCOUNTER — Ambulatory Visit (HOSPITAL_COMMUNITY)
Admission: RE | Admit: 2014-10-03 | Discharge: 2014-10-03 | Disposition: A | Discharge status: Home / Self Care | Payer: Medicaid Other | Source: Ambulatory Visit | Attending: Obstetrics & Gynecology | Admitting: Obstetrics & Gynecology

## 2014-10-03 DIAGNOSIS — Z36 Encounter for antenatal screening of mother: Secondary | ICD-10-CM | POA: Insufficient documentation

## 2014-10-03 DIAGNOSIS — Z3491 Encounter for supervision of normal pregnancy, unspecified, first trimester: Secondary | ICD-10-CM

## 2014-10-03 DIAGNOSIS — Z3682 Encounter for antenatal screening for nuchal translucency: Secondary | ICD-10-CM | POA: Insufficient documentation

## 2014-10-03 DIAGNOSIS — Z3A13 13 weeks gestation of pregnancy: Secondary | ICD-10-CM | POA: Insufficient documentation

## 2014-10-05 ENCOUNTER — Other Ambulatory Visit (HOSPITAL_COMMUNITY): Payer: Medicaid Other

## 2014-10-05 ENCOUNTER — Ambulatory Visit (HOSPITAL_COMMUNITY): Payer: Medicaid Other

## 2014-10-09 ENCOUNTER — Other Ambulatory Visit (HOSPITAL_COMMUNITY): Payer: Self-pay

## 2014-10-12 ENCOUNTER — Other Ambulatory Visit (INDEPENDENT_AMBULATORY_CARE_PROVIDER_SITE_OTHER): Payer: Medicaid Other | Admitting: *Deleted

## 2014-10-12 DIAGNOSIS — Z3A15 15 weeks gestation of pregnancy: Secondary | ICD-10-CM

## 2014-10-12 DIAGNOSIS — W1830XA Fall on same level, unspecified, initial encounter: Secondary | ICD-10-CM

## 2014-10-12 DIAGNOSIS — O9A212 Injury, poisoning and certain other consequences of external causes complicating pregnancy, second trimester: Secondary | ICD-10-CM

## 2014-10-12 DIAGNOSIS — Y9389 Activity, other specified: Secondary | ICD-10-CM

## 2014-10-12 NOTE — Progress Notes (Signed)
Patient fell yesterday, she landed on her leg and is not having any bleeding or cramping but she would like to just hear the babies heart beat for reassurance.  Fetal heart rate is seen and heard, baby is active on bedside ultrasound.  Patient is reassured.  She will keep her regular scheduled appointment.  She also wanted to let us know she was seen at urgent care and is being treated for a double ear infection.

## 2014-10-29 ENCOUNTER — Ambulatory Visit (INDEPENDENT_AMBULATORY_CARE_PROVIDER_SITE_OTHER): Payer: Medicaid Other | Admitting: Obstetrics & Gynecology

## 2014-10-29 ENCOUNTER — Encounter: Payer: Self-pay | Admitting: Obstetrics & Gynecology

## 2014-10-29 VITALS — BP 111/65 | HR 85 | Wt 215.0 lb

## 2014-10-29 DIAGNOSIS — Z3492 Encounter for supervision of normal pregnancy, unspecified, second trimester: Secondary | ICD-10-CM

## 2014-10-29 DIAGNOSIS — O99891 Other specified diseases and conditions complicating pregnancy: Secondary | ICD-10-CM

## 2014-10-29 DIAGNOSIS — O26892 Other specified pregnancy related conditions, second trimester: Secondary | ICD-10-CM

## 2014-10-29 DIAGNOSIS — M549 Dorsalgia, unspecified: Secondary | ICD-10-CM

## 2014-10-29 DIAGNOSIS — O9989 Other specified diseases and conditions complicating pregnancy, childbirth and the puerperium: Secondary | ICD-10-CM

## 2014-10-29 MED ORDER — CYCLOBENZAPRINE HCL 10 MG PO TABS: 10.0000 mg | ORAL_TABLET | Freq: Three times a day (TID) | ORAL | Status: DC | PRN

## 2014-10-29 NOTE — Progress Notes (Signed)
Anatomy scan scheduled on 11/09/14 Normal first screen, AFP only draw today. Reports low back pain; Flexeril prescribed and take Tylenol as needed. No other complaints or concerns.  Routine obstetric precautions reviewed.

## 2014-10-29 NOTE — Patient Instructions (Signed)
Return to clinic for any obstetric concerns or go to MAU for evaluation  

## 2014-10-31 LAB — ALPHA FETOPROTEIN, MATERNAL
AFP: 58.1 ng/mL
Curr Gest Age: 17.3 wks.days
MoM for AFP: 1.8
OPEN SPINA BIFIDA: NEGATIVE

## 2014-11-09 ENCOUNTER — Ambulatory Visit (HOSPITAL_COMMUNITY)
Admission: RE | Admit: 2014-11-09 | Discharge: 2014-11-09 | Disposition: A | Discharge status: Home / Self Care | Payer: Medicaid Other | Source: Ambulatory Visit | Attending: Obstetrics & Gynecology | Admitting: Obstetrics & Gynecology

## 2014-11-09 DIAGNOSIS — Z3689 Encounter for other specified antenatal screening: Secondary | ICD-10-CM | POA: Insufficient documentation

## 2014-11-09 DIAGNOSIS — Z3492 Encounter for supervision of normal pregnancy, unspecified, second trimester: Secondary | ICD-10-CM

## 2014-11-09 DIAGNOSIS — Z3A19 19 weeks gestation of pregnancy: Secondary | ICD-10-CM | POA: Insufficient documentation

## 2014-11-21 ENCOUNTER — Ambulatory Visit (INDEPENDENT_AMBULATORY_CARE_PROVIDER_SITE_OTHER): Payer: Medicaid Other | Admitting: Physician Assistant

## 2014-11-21 ENCOUNTER — Encounter: Payer: Self-pay | Admitting: Physician Assistant

## 2014-11-21 VITALS — BP 109/72 | HR 108

## 2014-11-21 DIAGNOSIS — Z3492 Encounter for supervision of normal pregnancy, unspecified, second trimester: Secondary | ICD-10-CM | POA: Diagnosis not present

## 2014-11-21 DIAGNOSIS — O9989 Other specified diseases and conditions complicating pregnancy, childbirth and the puerperium: Secondary | ICD-10-CM

## 2014-11-21 DIAGNOSIS — O26899 Other specified pregnancy related conditions, unspecified trimester: Secondary | ICD-10-CM

## 2014-11-21 DIAGNOSIS — R109 Unspecified abdominal pain: Secondary | ICD-10-CM

## 2014-11-21 NOTE — Patient Instructions (Signed)
Second Trimester of Pregnancy The second trimester is from week 13 through week 28, months 4 through 6. The second trimester is often a time when you feel your best. Your body has also adjusted to being pregnant, and you begin to feel better physically. Usually, morning sickness has lessened or quit completely, you may have more energy, and you may have an increase in appetite. The second trimester is also a time when the fetus is growing rapidly. At the end of the sixth month, the fetus is about 9 inches long and weighs about 1 pounds. You will likely begin to feel the baby move (quickening) between 18 and 20 weeks of the pregnancy. BODY CHANGES Your body goes through many changes during pregnancy. The changes vary from woman to woman.   Your weight will continue to increase. You will notice your lower abdomen bulging out.  You may begin to get stretch marks on your hips, abdomen, and breasts.  You may develop headaches that can be relieved by medicines approved by your health care provider.  You may urinate more often because the fetus is pressing on your bladder.  You may develop or continue to have heartburn as a result of your pregnancy.  You may develop constipation because certain hormones are causing the muscles that push waste through your intestines to slow down.  You may develop hemorrhoids or swollen, bulging veins (varicose veins).  You may have back pain because of the weight gain and pregnancy hormones relaxing your joints between the bones in your pelvis and as a result of a shift in weight and the muscles that support your balance.  Your breasts will continue to grow and be tender.  Your gums may bleed and may be sensitive to brushing and flossing.  Dark spots or blotches (chloasma, mask of pregnancy) may develop on your face. This will likely fade after the baby is born.  A dark line from your belly button to the pubic area (linea nigra) may appear. This will likely fade  after the baby is born.  You may have changes in your hair. These can include thickening of your hair, rapid growth, and changes in texture. Some women also have hair loss during or after pregnancy, or hair that feels dry or thin. Your hair will most likely return to normal after your baby is born. WHAT TO EXPECT AT YOUR PRENATAL VISITS During a routine prenatal visit:  You will be weighed to make sure you and the fetus are growing normally.  Your blood pressure will be taken.  Your abdomen will be measured to track your baby's growth.  The fetal heartbeat will be listened to.  Any test results from the previous visit will be discussed. Your health care provider may ask you:  How you are feeling.  If you are feeling the baby move.  If you have had any abnormal symptoms, such as leaking fluid, bleeding, severe headaches, or abdominal cramping.  If you have any questions. Other tests that may be performed during your second trimester include:  Blood tests that check for:  Low iron levels (anemia).  Gestational diabetes (between 24 and 28 weeks).  Rh antibodies.  Urine tests to check for infections, diabetes, or protein in the urine.  An ultrasound to confirm the proper growth and development of the baby.  An amniocentesis to check for possible genetic problems.  Fetal screens for spina bifida and Down syndrome. HOME CARE INSTRUCTIONS   Avoid all smoking, herbs, alcohol, and unprescribed   drugs. These chemicals affect the formation and growth of the baby.  Follow your health care provider's instructions regarding medicine use. There are medicines that are either safe or unsafe to take during pregnancy.  Exercise only as directed by your health care provider. Experiencing uterine cramps is a good sign to stop exercising.  Continue to eat regular, healthy meals.  Wear a good support bra for breast tenderness.  Do not use hot tubs, steam rooms, or saunas.  Wear your  seat belt at all times when driving.  Avoid raw meat, uncooked cheese, cat litter boxes, and soil used by cats. These carry germs that can cause birth defects in the baby.  Take your prenatal vitamins.  Try taking a stool softener (if your health care provider approves) if you develop constipation. Eat more high-fiber foods, such as fresh vegetables or fruit and whole grains. Drink plenty of fluids to keep your urine clear or pale yellow.  Take warm sitz baths to soothe any pain or discomfort caused by hemorrhoids. Use hemorrhoid cream if your health care provider approves.  If you develop varicose veins, wear support hose. Elevate your feet for 15 minutes, 3-4 times a day. Limit salt in your diet.  Avoid heavy lifting, wear low heel shoes, and practice good posture.  Rest with your legs elevated if you have leg cramps or low back pain.  Visit your dentist if you have not gone yet during your pregnancy. Use a soft toothbrush to brush your teeth and be gentle when you floss.  A sexual relationship may be continued unless your health care provider directs you otherwise.  Continue to go to all your prenatal visits as directed by your health care provider. SEEK MEDICAL CARE IF:   You have dizziness.  You have mild pelvic cramps, pelvic pressure, or nagging pain in the abdominal area.  You have persistent nausea, vomiting, or diarrhea.  You have a bad smelling vaginal discharge.  You have pain with urination. SEEK IMMEDIATE MEDICAL CARE IF:   You have a fever.  You are leaking fluid from your vagina.  You have spotting or bleeding from your vagina.  You have severe abdominal cramping or pain.  You have rapid weight gain or loss.  You have shortness of breath with chest pain.  You notice sudden or extreme swelling of your face, hands, ankles, feet, or legs.  You have not felt your baby move in over an hour.  You have severe headaches that do not go away with  medicine.  You have vision changes. Document Released: 07/28/2001 Document Revised: 08/08/2013 Document Reviewed: 10/04/2012 [employer] Patient Information 2015 [employer]. This information is not intended to replace advice given to you by your health care provider. Make sure you discuss any questions you have with your health care provider.  

## 2014-11-21 NOTE — Progress Notes (Signed)
Patient is having increased nose bleeds.

## 2014-11-21 NOTE — Progress Notes (Signed)
20 weeks.  Endorses good fetal movement.  Denies LOF, VB, dysuria.  Weird sharp pain 2-3 times per week located in middle, epigastric.  Not associated with eating, pooping or needing to poop, ?if associated with movement.   Abdominal exam: unable to reproduce pain.  No TTP and + bowel sounds in all 4 quadrants.  No mass appreciated.   Will obtain CBC, urine culture.  Pt asked to keep diary of pain/activity/food intake.   RTC 4 weeks for OBF.  Sooner PRN

## 2014-11-22 LAB — CBC
HEMATOCRIT: 37.1 % (ref 36.0–46.0)
Hemoglobin: 12.6 g/dL (ref 12.0–15.0)
MCH: 31.3 pg (ref 26.0–34.0)
MCHC: 34 g/dL (ref 30.0–36.0)
MCV: 92.3 fL (ref 78.0–100.0)
MPV: 10.2 fL (ref 8.6–12.4)
Platelets: 264 10*3/uL (ref 150–400)
RBC: 4.02 MIL/uL (ref 3.87–5.11)
RDW: 13.3 % (ref 11.5–15.5)
WBC: 8.7 10*3/uL (ref 4.0–10.5)

## 2014-11-22 LAB — URINE CULTURE: Colony Count: 7000

## 2014-11-26 ENCOUNTER — Encounter: Payer: Medicaid Other | Admitting: Obstetrics & Gynecology

## 2014-12-08 NOTE — Op Note (Signed)
PATIENT NAME:  Robyn Ortiz, Robyn Ortiz MR#:  [id number] DATE OF BIRTH:  Jul 03, 1986  DATE OF PROCEDURE:  03/27/2014  PREOPERATIVE DIAGNOSIS: Chronic cholecystitis and cholelithiasis.   POSTOPERATIVE DIAGNOSIS: Chronic cholecystitis and cholelithiasis.   OPERATIVE PROCEDURE: Laparoscopic cholecystectomy with intraoperative cholangiograms.   SURGEON: Donnalee CurryJeffrey Byrnett, MD   ANESTHESIA: General endotracheal under Dr. Noralyn Pickarroll.   ESTIMATED BLOOD LOSS: Less than 5 mL.   CLINICAL NOTE: This 29 year old woman has had episodic abdominal pain and diarrhea. Ultrasound showed evidence of cholelithiasis. Liver function studies were normal. She was felt to be a candidate for cholecystectomy.   OPERATIVE NOTE: With the patient under adequate general endotracheal anesthesia, the abdomen was prepped with ChloraPrep and draped. In Trendelenburg position, a Veress needle was placed through a transumbilical incision. After assuring intra-abdominal location with the hanging drop test, the abdomen was insufflated with CO2 at 10 mmHg pressure. A 10 mm step port was expanded and inspection showed no evidence of injury from initial port placement. The patient was placed in reverse Trendelenburg position and rolled to the left. An 11 mm Xcel port was placed in the epigastrium. Two 5-mm step ports were placed in the right lateral abdominal wall under direct visualization. The omentum was draped over the gallbladder and it was found to be fairly tensely distended with chronic changes. This was decompressed with a needle catheter. This allowed for good purchase with the grasper. The gallbladder was placed on cephalad traction. Extensive adhesions of the omentum to the neck of the gallbladder were identified and these were taken down with cautery dissection. The cystic duct was cleared and fluoroscopic cholangiograms completed using 16 mL of one-half strength Conray 60. This showed fairly prominent duct, but no evidence of filling  defect. Reflux in the right and left hepatic ducts was identified. Partial filling of the pancreatic duct. The cystic duct was doubly clipped as was the cystic artery. The gallbladder was then removed from the liver bed making use of hook cautery dissection. Due to its size and the likelihood of impacted stones making extraction difficult, it was elected to place this into an Endo Catch bag. The bag was then delivered through the umbilical port site. Inspection from the epigastric site showed no evidence of injury from initial port placement. It was necessary to expand the umbilical fascial incision to allow extraction of the gallbladder and stones. After re-establishing pneumoperitoneum, inspection of the right upper quadrant showed good hemostasis. The area was irrigated with lactated Ringer's solution. The abdomen was then desufflated and ports removed under direct vision. The fascia at the umbilicus was closed with an 0 Maxon figure-of-eight suture. Skin incisions were closed with 4-0 Vicryl subcuticular suture. Benzoin, Steri-Strips, Telfa and Tegaderm dressings were then applied.   The patient tolerated the procedure well and was taken to the recovery room in stable condition.    ____________________________ Earline MayotteJeffrey W. Byrnett, MD jwb:TT D: 03/27/2014 13:17:42 ET T: 03/27/2014 15:32:21 ET JOB#: [id number]  cc: Earline MayotteJeffrey W. Byrnett, MD, <Dictator> Meindert A. Lacie ScottsNiemeyer, MD JEFFREY Brion AlimentW BYRNETT MD ELECTRONICALLY SIGNED 03/28/2014 7:09

## 2014-12-10 ENCOUNTER — Encounter: Payer: Self-pay | Admitting: *Deleted

## 2014-12-11 ENCOUNTER — Encounter: Payer: Self-pay | Admitting: Family

## 2014-12-11 ENCOUNTER — Encounter (HOSPITAL_COMMUNITY): Payer: Self-pay | Admitting: *Deleted

## 2014-12-11 ENCOUNTER — Inpatient Hospital Stay (HOSPITAL_COMMUNITY)
Admission: AD | Admit: 2014-12-11 | Discharge: 2014-12-11 | Disposition: A | Discharge status: Home / Self Care | Payer: Medicaid Other | Source: Ambulatory Visit | Attending: Obstetrics and Gynecology | Admitting: Obstetrics and Gynecology

## 2014-12-11 DIAGNOSIS — O9989 Other specified diseases and conditions complicating pregnancy, childbirth and the puerperium: Secondary | ICD-10-CM | POA: Diagnosis not present

## 2014-12-11 DIAGNOSIS — O99332 Smoking (tobacco) complicating pregnancy, second trimester: Secondary | ICD-10-CM | POA: Diagnosis not present

## 2014-12-11 DIAGNOSIS — R0989 Other specified symptoms and signs involving the circulatory and respiratory systems: Secondary | ICD-10-CM | POA: Insufficient documentation

## 2014-12-11 DIAGNOSIS — R05 Cough: Secondary | ICD-10-CM | POA: Diagnosis not present

## 2014-12-11 DIAGNOSIS — F17210 Nicotine dependence, cigarettes, uncomplicated: Secondary | ICD-10-CM | POA: Diagnosis not present

## 2014-12-11 DIAGNOSIS — B349 Viral infection, unspecified: Secondary | ICD-10-CM | POA: Diagnosis not present

## 2014-12-11 DIAGNOSIS — J029 Acute pharyngitis, unspecified: Secondary | ICD-10-CM | POA: Insufficient documentation

## 2014-12-11 DIAGNOSIS — Z3A23 23 weeks gestation of pregnancy: Secondary | ICD-10-CM | POA: Insufficient documentation

## 2014-12-11 LAB — RAPID STREP SCREEN (MED CTR MEBANE ONLY): Streptococcus, Group A Screen (Direct): NEGATIVE

## 2014-12-11 MED ORDER — AZITHROMYCIN 250 MG PO TABS: ORAL_TABLET | ORAL | Status: DC

## 2014-12-11 NOTE — MAU Note (Signed)
Urine in lab 

## 2014-12-11 NOTE — MAU Note (Signed)
Fetal tracing in [facility] starting at 1645 is not on [employer] Ortiz. Patient was not discharged from [facility] properly and a new patient was monitored under Robyn Ortiz's name.

## 2014-12-11 NOTE — Progress Notes (Signed)
Patient called and given result of strep screen -  Results for orders placed or performed during the hospital encounter of 12/11/14 (from the past 24 hour(s))  Rapid strep screen     Status: None   Collection Time: 12/11/14  9:26 AM  Result Value Ref Range   Streptococcus, Group A Screen (Direct) NEGATIVE NEGATIVE

## 2014-12-11 NOTE — MAU Note (Signed)
Pt complains of cold since Friday. Nasal congestion, cough, sore throat. Headache behind eyes. Lower abd pain at times. Denies bleeding or discharge.

## 2014-12-11 NOTE — MAU Provider Note (Signed)
History     CSN: [id number]  Arrival date and time: 12/11/14 [id number]   First Provider Initiated Contact with Patient 12/11/14 [phone]      Chief Complaint  Patient presents with  . Sore Throat   HPI  [name] is a 29 y.o. G3P2002 at 2374w4d here with report of sore throat that started two days ago.  Also reports congestion, cough, and body aches.  Concerned due to recent exposure to niece with strep throat.    Also reports left groin pain.  Denies vaginal bleeding, contractions, or leaking of fluid.    Past Medical History  Diagnosis Date  . History of bronchitis 5852yrs ago  . Migraine   . History of bleeding ulcers     told not to take ibuprofen  . Obesity   . GERD (gastroesophageal reflux disease)     Past Surgical History  Procedure Laterality Date  . Tonsillectomy  03/10/2012    Procedure: TONSILLECTOMY;  Surgeon: Suzanna ObeyJohn Byers, MD;  Location: [facility] OR;  Service: ENT;  Laterality: Bilateral;  . Cholecystectomy  03-27-14    Dr Lemar LivingsByrnett    Family History  Problem Relation Age of Onset  . Diabetes Father   . Heart disease Paternal Grandmother   . Cancer Paternal Grandfather     intestional cancer  . Other Paternal Grandfather     deteriorating disc  . Bipolar disorder Mother     History  Substance Use Topics  . Smoking status: Current Every Day Smoker -- 1.00 packs/day for 13 years    Types: Cigarettes  . Smokeless tobacco: Never Used  . Alcohol Use: Yes     Comment: beer occasionally    Allergies:  Allergies  Allergen Reactions  . Codeine Other (See Comments)    Pt. States she had a seizure  . Ibuprofen     Patient was diagnosed with bleeding ulcers and advised not to take this medication.    Prescriptions prior to admission  Medication Sig Dispense Refill Last Dose  . acetaminophen (TYLENOL) 500 MG tablet Take 500 mg by mouth every 6 (six) hours as needed.   Taking  . cyclobenzaprine (FLEXERIL) 10 MG tablet Take 1 tablet (10 mg total) by mouth every  8 (eight) hours as needed for muscle spasms. 30 tablet 1 Taking  . Docosahexaenoic Acid (PRENATAL DHA PO) Take by mouth.   Taking  . Doxylamine-Pyridoxine (DICLEGIS) 10-10 MG TBEC Take 2 tablets QHS, if symptoms persist add one tablet QAM on day 3, if symptoms persist add one tablet every afternoon starting day 4. 100 tablet 3 Taking  . triamcinolone (KENALOG) 0.025 % ointment Apply 1 application topically 2 (two) times daily. 30 g 3 Taking    Review of Systems  Constitutional: Negative for fever and chills.  HENT: Positive for congestion and sore throat.   Respiratory: Positive for cough and sputum production (yellow tinge). Negative for shortness of breath and wheezing.   Gastrointestinal: Positive for nausea. Negative for vomiting, abdominal pain and diarrhea.  Genitourinary: Negative for dysuria.  Musculoskeletal: Positive for myalgias.  Neurological: Positive for headaches.  All other systems reviewed and are negative.  Physical Exam   Blood pressure 104/57, pulse 83, temperature 97.8 F (36.6 C), resp. rate 22, weight 100.608 kg (221 lb 12.8 oz), last menstrual period 06/20/2014.  Physical Exam  Constitutional: She is oriented to person, place, and time. She appears well-developed and well-nourished. No distress.  HENT:  Head: Normocephalic.  Nose: Right sinus exhibits maxillary sinus  tenderness. Right sinus exhibits no frontal sinus tenderness. Left sinus exhibits maxillary sinus tenderness. Left sinus exhibits no frontal sinus tenderness.  Mouth/Throat: Mucous membranes are not dry. Posterior oropharyngeal erythema present.  Neck: Normal range of motion. Neck supple.  Cardiovascular: Normal rate, regular rhythm and normal heart sounds.   Respiratory: Effort normal and breath sounds normal.  GI: Soft. There is no tenderness.  Genitourinary: No bleeding in the vagina.  Neurological: She is alert and oriented to person, place, and time.  Skin: Skin is warm and dry.   FHR  130's, +accels  Toco - none MAU Course  Procedures  MDM Rapid strep screen  Assessment and Plan  Suspected Viral Illness  Plan: Increase fluids Tylenol as needed for pain RX Zpac if no improvement in 48 hours Rapid strep screen pending  Rochele Pages N 12/11/2014, 8:59 AM

## 2014-12-11 NOTE — Discharge Instructions (Signed)
Antibiotic Resistance °Antibiotics are drugs. They fight infections caused by bacteria. Antibiotics greatly reduce illness and death from infectious diseases. Over time, the bacteria that antibiotics once controlled are much harder to kill. °CAUSES  °Antibiotic resistance occurs when bacteria change in some way. These changes can lessen the abilities of drugs designed to cure infections. The overuse of antibiotics can cause antibiotic resistance. Almost all important bacterial infections in the world are becoming resistant to drugs. Antibiotic resistance has been called one of the world's most pressing public health problems.  °Antibiotics should be used to treat bacterial infections. But they are not effective against viral infections. These include the common cold, most sore throats, and the flu. Smart use of antibiotics will control the spread of resistance.  °TREATMENT  °· Only use antibiotics as prescribed by your caregiver. °· Talk with your caregiver about antibiotic resistance. °· Ask what else you can do to feel better. °· Do not take an antibiotic for a viral infection. This could be a cold, cough, or the flu. °· Do not save some of your antibiotic for the next time you get sick. °· Take an antibiotic exactly as the caregiver tells you. °· Do not take an antibiotic that is prescribed for someone else. °· Use the antibiotic as directed. Take the correct dose at the scheduled time. °SEEK MEDICAL CARE IF: °· You react to the antibiotic with: °¨ A rash. °¨ Itching. °¨ An upset stomach. °Document Released: 10/24/2002 Document Revised: 12/18/2013 Document Reviewed: 05/28/2008 °[employer]® Patient Information ©2015 [employer]. This information is not intended to replace advice given to you by your health care provider. Make sure you discuss any questions you have with your health care provider. ° °

## 2014-12-13 LAB — CULTURE, GROUP A STREP: Strep A Culture: NEGATIVE

## 2014-12-19 ENCOUNTER — Ambulatory Visit (HOSPITAL_COMMUNITY)
Admission: RE | Admit: 2014-12-19 | Discharge: 2014-12-19 | Disposition: A | Discharge status: Home / Self Care | Payer: Medicaid Other | Source: Ambulatory Visit | Attending: Family Medicine | Admitting: Family Medicine

## 2014-12-19 ENCOUNTER — Encounter: Payer: Self-pay | Admitting: Family Medicine

## 2014-12-19 ENCOUNTER — Ambulatory Visit (INDEPENDENT_AMBULATORY_CARE_PROVIDER_SITE_OTHER): Payer: Medicaid Other | Admitting: Family Medicine

## 2014-12-19 VITALS — BP 96/65 | HR 73 | Wt 225.8 lb

## 2014-12-19 DIAGNOSIS — Z36 Encounter for antenatal screening of mother: Secondary | ICD-10-CM | POA: Insufficient documentation

## 2014-12-19 DIAGNOSIS — O99212 Obesity complicating pregnancy, second trimester: Secondary | ICD-10-CM | POA: Insufficient documentation

## 2014-12-19 DIAGNOSIS — Z3492 Encounter for supervision of normal pregnancy, unspecified, second trimester: Secondary | ICD-10-CM

## 2014-12-19 DIAGNOSIS — Z3A24 24 weeks gestation of pregnancy: Secondary | ICD-10-CM | POA: Diagnosis not present

## 2014-12-19 NOTE — Patient Instructions (Signed)
Second Trimester of Pregnancy The second trimester is from week 13 through week 28, months 4 through 6. The second trimester is often a time when you feel your best. Your body has also adjusted to being pregnant, and you begin to feel better physically. Usually, morning sickness has lessened or quit completely, you may have more energy, and you may have an increase in appetite. The second trimester is also a time when the fetus is growing rapidly. At the end of the sixth month, the fetus is about 9 inches long and weighs about 1 pounds. You will likely begin to feel the baby move (quickening) between 18 and 20 weeks of the pregnancy. BODY CHANGES Your body goes through many changes during pregnancy. The changes vary from woman to woman.   Your weight will continue to increase. You will notice your lower abdomen bulging out.  You may begin to get stretch marks on your hips, abdomen, and breasts.  You may develop headaches that can be relieved by medicines approved by your health care provider.  You may urinate more often because the fetus is pressing on your bladder.  You may develop or continue to have heartburn as a result of your pregnancy.  You may develop constipation because certain hormones are causing the muscles that push waste through your intestines to slow down.  You may develop hemorrhoids or swollen, bulging veins (varicose veins).  You may have back pain because of the weight gain and pregnancy hormones relaxing your joints between the bones in your pelvis and as a result of a shift in weight and the muscles that support your balance.  Your breasts will continue to grow and be tender.  Your gums may bleed and may be sensitive to brushing and flossing.  Dark spots or blotches (chloasma, mask of pregnancy) may develop on your face. This will likely fade after the baby is born.  A dark line from your belly button to the pubic area (linea nigra) may appear. This will likely  fade after the baby is born.  You may have changes in your hair. These can include thickening of your hair, rapid growth, and changes in texture. Some women also have hair loss during or after pregnancy, or hair that feels dry or thin. Your hair will most likely return to normal after your baby is born. WHAT TO EXPECT AT YOUR PRENATAL VISITS During a routine prenatal visit:  You will be weighed to make sure you and the fetus are growing normally.  Your blood pressure will be taken.  Your abdomen will be measured to track your baby's growth.  The fetal heartbeat will be listened to.  Any test results from the previous visit will be discussed. Your health care provider may ask you:  How you are feeling.  If you are feeling the baby move.  If you have had any abnormal symptoms, such as leaking fluid, bleeding, severe headaches, or abdominal cramping.  If you have any questions. Other tests that may be performed during your second trimester include:  Blood tests that check for:  Low iron levels (anemia).  Gestational diabetes (between 24 and 28 weeks).  Rh antibodies.  Urine tests to check for infections, diabetes, or protein in the urine.  An ultrasound to confirm the proper growth and development of the baby.  An amniocentesis to check for possible genetic problems.  Fetal screens for spina bifida and Down syndrome. HOME CARE INSTRUCTIONS   Avoid all smoking, herbs, alcohol, and unprescribed   drugs. These chemicals affect the formation and growth of the baby.  Follow your health care provider's instructions regarding medicine use. There are medicines that are either safe or unsafe to take during pregnancy.  Exercise only as directed by your health care provider. Experiencing uterine cramps is a good sign to stop exercising.  Continue to eat regular, healthy meals.  Wear a good support bra for breast tenderness.  Do not use hot tubs, steam rooms, or saunas.  Wear  your seat belt at all times when driving.  Avoid raw meat, uncooked cheese, cat litter boxes, and soil used by cats. These carry germs that can cause birth defects in the baby.  Take your prenatal vitamins.  Try taking a stool softener (if your health care provider approves) if you develop constipation. Eat more high-fiber foods, such as fresh vegetables or fruit and whole grains. Drink plenty of fluids to keep your urine clear or pale yellow.  Take warm sitz baths to soothe any pain or discomfort caused by hemorrhoids. Use hemorrhoid cream if your health care provider approves.  If you develop varicose veins, wear support hose. Elevate your feet for 15 minutes, 3-4 times a day. Limit salt in your diet.  Avoid heavy lifting, wear low heel shoes, and practice good posture.  Rest with your legs elevated if you have leg cramps or low back pain.  Visit your dentist if you have not gone yet during your pregnancy. Use a soft toothbrush to brush your teeth and be gentle when you floss.  A sexual relationship may be continued unless your health care provider directs you otherwise.  Continue to go to all your prenatal visits as directed by your health care provider. SEEK MEDICAL CARE IF:   You have dizziness.  You have mild pelvic cramps, pelvic pressure, or nagging pain in the abdominal area.  You have persistent nausea, vomiting, or diarrhea.  You have a bad smelling vaginal discharge.  You have pain with urination. SEEK IMMEDIATE MEDICAL CARE IF:   You have a fever.  You are leaking fluid from your vagina.  You have spotting or bleeding from your vagina.  You have severe abdominal cramping or pain.  You have rapid weight gain or loss.  You have shortness of breath with chest pain.  You notice sudden or extreme swelling of your face, hands, ankles, feet, or legs.  You have not felt your baby move in over an hour.  You have severe headaches that do not go away with  medicine.  You have vision changes. Document Released: 07/28/2001 Document Revised: 08/08/2013 Document Reviewed: 10/04/2012 [employer] Patient Information 2015 [employer]. This information is not intended to replace advice given to you by your health care provider. Make sure you discuss any questions you have with your health care provider.  Breastfeeding Deciding to breastfeed is one of the best choices you can make for you and your baby. A change in hormones during pregnancy causes your breast tissue to grow and increases the number and size of your milk ducts. These hormones also allow proteins, sugars, and fats from your blood supply to make breast milk in your milk-producing glands. Hormones prevent breast milk from being released before your baby is born as well as prompt milk flow after birth. Once breastfeeding has begun, thoughts of your baby, as well as his or her sucking or crying, can stimulate the release of milk from your milk-producing glands.  BENEFITS OF BREASTFEEDING For Your Baby  Your first   milk (colostrum) helps your baby's digestive system function better.   There are antibodies in your milk that help your baby fight off infections.   Your baby has a lower incidence of asthma, allergies, and sudden infant death syndrome.   The nutrients in breast milk are better for your baby than infant formulas and are designed uniquely for your baby's needs.   Breast milk improves your baby's brain development.   Your baby is less likely to develop other conditions, such as childhood obesity, asthma, or type 2 diabetes mellitus.  For You   Breastfeeding helps to create a very special bond between you and your baby.   Breastfeeding is convenient. Breast milk is always available at the correct temperature and costs nothing.   Breastfeeding helps to burn calories and helps you lose the weight gained during pregnancy.   Breastfeeding makes your uterus contract to its  prepregnancy size faster and slows bleeding (lochia) after you give birth.   Breastfeeding helps to lower your risk of developing type 2 diabetes mellitus, osteoporosis, and breast or ovarian cancer later in life. SIGNS THAT YOUR BABY IS HUNGRY Early Signs of Hunger  Increased alertness or activity.  Stretching.  Movement of the head from side to side.  Movement of the head and opening of the mouth when the corner of the mouth or cheek is stroked (rooting).  Increased sucking sounds, smacking lips, cooing, sighing, or squeaking.  Hand-to-mouth movements.  Increased sucking of fingers or hands. Late Signs of Hunger  Fussing.  Intermittent crying. Extreme Signs of Hunger Signs of extreme hunger will require calming and consoling before your baby will be able to breastfeed successfully. Do not wait for the following signs of extreme hunger to occur before you initiate breastfeeding:   Restlessness.  A loud, strong cry.   Screaming. BREASTFEEDING BASICS Breastfeeding Initiation  Find a comfortable place to sit or lie down, with your neck and back well supported.  Place a pillow or rolled up blanket under your baby to bring him or her to the level of your breast (if you are seated). Nursing pillows are specially designed to help support your arms and your baby while you breastfeed.  Make sure that your baby's abdomen is facing your abdomen.   Gently massage your breast. With your fingertips, massage from your chest wall toward your nipple in a circular motion. This encourages milk flow. You may need to continue this action during the feeding if your milk flows slowly.  Support your breast with 4 fingers underneath and your thumb above your nipple. Make sure your fingers are well away from your nipple and your baby's mouth.   Stroke your baby's lips gently with your finger or nipple.   When your baby's mouth is open wide enough, quickly bring your baby to your breast,  placing your entire nipple and as much of the colored area around your nipple (areola) as possible into your baby's mouth.   More areola should be visible above your baby's upper lip than below the lower lip.   Your baby's tongue should be between his or her lower gum and your breast.   Ensure that your baby's mouth is correctly positioned around your nipple (latched). Your baby's lips should create a seal on your breast and be turned out (everted).  It is common for your baby to suck about 2-3 minutes in order to start the flow of breast milk. Latching Teaching your baby how to latch on to your breast   properly is very important. An improper latch can cause nipple pain and decreased milk supply for you and poor weight gain in your baby. Also, if your baby is not latched onto your nipple properly, he or she may swallow some air during feeding. This can make your baby fussy. Burping your baby when you switch breasts during the feeding can help to get rid of the air. However, teaching your baby to latch on properly is still the best way to prevent fussiness from swallowing air while breastfeeding. Signs that your baby has successfully latched on to your nipple:    Silent tugging or silent sucking, without causing you pain.   Swallowing heard between every 3-4 sucks.    Muscle movement above and in front of his or her ears while sucking.  Signs that your baby has not successfully latched on to nipple:   Sucking sounds or smacking sounds from your baby while breastfeeding.  Nipple pain. If you think your baby has not latched on correctly, slip your finger into the corner of your baby's mouth to break the suction and place it between your baby's gums. Attempt breastfeeding initiation again. Signs of Successful Breastfeeding Signs from your baby:   A gradual decrease in the number of sucks or complete cessation of sucking.   Falling asleep.   Relaxation of his or her body.    Retention of a small amount of milk in his or her mouth.   Letting go of your breast by himself or herself. Signs from you:  Breasts that have increased in firmness, weight, and size 1-3 hours after feeding.   Breasts that are softer immediately after breastfeeding.  Increased milk volume, as well as a change in milk consistency and color by the fifth day of breastfeeding.   Nipples that are not sore, cracked, or bleeding. Signs That Your Baby is Getting Enough Milk  Wetting at least 3 diapers in a 24-hour period. The urine should be clear and pale yellow by age 5 days.  At least 3 stools in a 24-hour period by age 5 days. The stool should be soft and yellow.  At least 3 stools in a 24-hour period by age 7 days. The stool should be seedy and yellow.  No loss of weight greater than 10% of birth weight during the first 3 days of age.  Average weight gain of 4-7 ounces (113-198 g) per week after age 4 days.  Consistent daily weight gain by age 5 days, without weight loss after the age of 2 weeks. After a feeding, your baby may spit up a small amount. This is common. BREASTFEEDING FREQUENCY AND DURATION Frequent feeding will help you make more milk and can prevent sore nipples and breast engorgement. Breastfeed when you feel the need to reduce the fullness of your breasts or when your baby shows signs of hunger. This is called "breastfeeding on demand." Avoid introducing a pacifier to your baby while you are working to establish breastfeeding (the first 4-6 weeks after your baby is born). After this time you may choose to use a pacifier. Research has shown that pacifier use during the first year of a baby's life decreases the risk of sudden infant death syndrome (SIDS). Allow your baby to feed on each breast as long as he or she wants. Breastfeed until your baby is finished feeding. When your baby unlatches or falls asleep while feeding from the first breast, offer the second breast.  Because newborns are often sleepy in the   first few weeks of life, you may need to awaken your baby to get him or her to feed. Breastfeeding times will vary from baby to baby. However, the following rules can serve as a guide to help you ensure that your baby is properly fed:  Newborns (babies 4 weeks of age or younger) may breastfeed every 1-3 hours.  Newborns should not go longer than 3 hours during the day or 5 hours during the night without breastfeeding.  You should breastfeed your baby a minimum of 8 times in a 24-hour period until you begin to introduce solid foods to your baby at around 6 months of age. BREAST MILK PUMPING Pumping and storing breast milk allows you to ensure that your baby is exclusively fed your breast milk, even at times when you are unable to breastfeed. This is especially important if you are going back to work while you are still breastfeeding or when you are not able to be present during feedings. Your lactation consultant can give you guidelines on how long it is safe to store breast milk.  A breast pump is a machine that allows you to pump milk from your breast into a sterile bottle. The pumped breast milk can then be stored in a refrigerator or freezer. Some breast pumps are operated by hand, while others use electricity. Ask your lactation consultant which type will work best for you. Breast pumps can be purchased, but some hospitals and breastfeeding support groups lease breast pumps on a monthly basis. A lactation consultant can teach you how to hand express breast milk, if you prefer not to use a pump.  CARING FOR YOUR BREASTS WHILE YOU BREASTFEED Nipples can become dry, cracked, and sore while breastfeeding. The following recommendations can help keep your breasts moisturized and healthy:  Avoid using soap on your nipples.   Wear a supportive bra. Although not required, special nursing bras and tank tops are designed to allow access to your breasts for  breastfeeding without taking off your entire bra or top. Avoid wearing underwire-style bras or extremely tight bras.  Air dry your nipples for 3-4minutes after each feeding.   Use only cotton bra pads to absorb leaked breast milk. Leaking of breast milk between feedings is normal.   Use lanolin on your nipples after breastfeeding. Lanolin helps to maintain your skin's normal moisture barrier. If you use pure lanolin, you do not need to wash it off before feeding your baby again. Pure lanolin is not toxic to your baby. You may also hand express a few drops of breast milk and gently massage that milk into your nipples and allow the milk to air dry. In the first few weeks after giving birth, some women experience extremely full breasts (engorgement). Engorgement can make your breasts feel heavy, warm, and tender to the touch. Engorgement peaks within 3-5 days after you give birth. The following recommendations can help ease engorgement:  Completely empty your breasts while breastfeeding or pumping. You may want to start by applying warm, moist heat (in the shower or with warm water-soaked hand towels) just before feeding or pumping. This increases circulation and helps the milk flow. If your baby does not completely empty your breasts while breastfeeding, pump any extra milk after he or she is finished.  Wear a snug bra (nursing or regular) or tank top for 1-2 days to signal your body to slightly decrease milk production.  Apply ice packs to your breasts, unless this is too uncomfortable for you.    Make sure that your baby is latched on and positioned properly while breastfeeding. If engorgement persists after 48 hours of following these recommendations, contact your health care provider or a lactation consultant. OVERALL HEALTH CARE RECOMMENDATIONS WHILE BREASTFEEDING  Eat healthy foods. Alternate between meals and snacks, eating 3 of each per day. Because what you eat affects your breast milk,  some of the foods may make your baby more irritable than usual. Avoid eating these foods if you are sure that they are negatively affecting your baby.  Drink milk, fruit juice, and water to satisfy your thirst (about 10 glasses a day).   Rest often, relax, and continue to take your prenatal vitamins to prevent fatigue, stress, and anemia.  Continue breast self-awareness checks.  Avoid chewing and smoking tobacco.  Avoid alcohol and drug use. Some medicines that may be harmful to your baby can pass through breast milk. It is important to ask your health care provider before taking any medicine, including all over-the-counter and prescription medicine as well as vitamin and herbal supplements. It is possible to become pregnant while breastfeeding. If birth control is desired, ask your health care provider about options that will be safe for your baby. SEEK MEDICAL CARE IF:   You feel like you want to stop breastfeeding or have become frustrated with breastfeeding.  You have painful breasts or nipples.  Your nipples are cracked or bleeding.  Your breasts are red, tender, or warm.  You have a swollen area on either breast.  You have a fever or chills.  You have nausea or vomiting.  You have drainage other than breast milk from your nipples.  Your breasts do not become full before feedings by the fifth day after you give birth.  You feel sad and depressed.  Your baby is too sleepy to eat well.  Your baby is having trouble sleeping.   Your baby is wetting less than 3 diapers in a 24-hour period.  Your baby has less than 3 stools in a 24-hour period.  Your baby's skin or the white part of his or her eyes becomes yellow.   Your baby is not gaining weight by 5 days of age. SEEK IMMEDIATE MEDICAL CARE IF:   Your baby is overly tired (lethargic) and does not want to wake up and feed.  Your baby develops an unexplained fever. Document Released: 08/03/2005 Document Revised:  08/08/2013 Document Reviewed: 01/25/2013 [employer] Patient Information 2015 [employer]. This information is not intended to replace advice given to you by your health care provider. Make sure you discuss any questions you have with your health care provider.  

## 2014-12-19 NOTE — Progress Notes (Signed)
To complete anatomy today 28 wk labs and TDaP next visit.

## 2015-01-08 ENCOUNTER — Ambulatory Visit (INDEPENDENT_AMBULATORY_CARE_PROVIDER_SITE_OTHER): Payer: Medicaid Other | Admitting: Physician Assistant

## 2015-01-08 ENCOUNTER — Encounter: Payer: Self-pay | Admitting: Physician Assistant

## 2015-01-08 VITALS — BP 99/67 | HR 85 | Wt 225.8 lb

## 2015-01-08 DIAGNOSIS — J029 Acute pharyngitis, unspecified: Secondary | ICD-10-CM

## 2015-01-08 MED ORDER — MAGIC MOUTHWASH W/LIDOCAINE: 5.0000 mL | Freq: Three times a day (TID) | ORAL | Status: DC | PRN

## 2015-01-08 NOTE — Progress Notes (Signed)
Patient is having increased sinus congestion, drainage, sore throat, hard to breath.

## 2015-01-08 NOTE — Patient Instructions (Signed)
Safe Over-the-Counter Medications in Pregnancy  ° °Acne: °Benzoyl Peroxide °Salicylic Acid ° °Backache/Headache: °Tylenol: 2 regular strength every 4 hours OR °             2 Extra strength every 6 hours ° °Colds/Coughs/Allergies: °Benadryl (alcohol free) 25 mg every 6 hours as needed °Breath right strips °Claritin °Cepacol throat lozenges °Chloraseptic throat spray °Cold-Eeze- up to three times per day °Cough drops, alcohol free °Flonase (by prescription only) °Guaifenesin °Mucinex °Robitussin DM (plain only, alcohol free) °Saline nasal spray/drops °Sudafed (pseudoephedrine) & Actifed ** use only after [redacted] weeks gestation and if you do not have high blood pressure °Tylenol °Vicks Vaporub °Zinc lozenges °Zyrtec  ° °Constipation: °Colace °Ducolax suppositories °Fleet enema °Glycerin suppositories °Metamucil °Milk of magnesia °Miralax °Senokot °Smooth move tea ° °Diarrhea: °Kaopectate °Imodium A-D ° °*NO pepto Bismol ° °Hemorrhoids: °Anusol °Anusol HC °Preparation H °Tucks ° °Indigestion: °Tums °Maalox °Mylanta °Zantac  °Pepcid ° °Insomnia: °Benadryl (alcohol free) 25mg every 6 hours as needed °Tylenol PM °Unisom, no Gelcaps ° °Leg Cramps: °Tums °MagGel ° °Nausea/Vomiting:  °Bonine °Dramamine °Emetrol °Ginger extract °Sea bands °Meclizine  °Nausea medication to take during pregnancy:  °Unisom (doxylamine succinate 25 mg tablets) Take one tablet daily at bedtime. If symptoms are not adequately controlled, the dose can be increased to a maximum recommended dose of two tablets daily (1/2 tablet in the morning, 1/2 tablet mid-afternoon and one at bedtime). °Vitamin B6 100mg tablets. Take one tablet twice a day (up to 200 mg per day). ° °Skin Rashes: °Aveeno products °Benadryl cream or 25mg every 6 hours as needed °Calamine Lotion °1% cortisone cream ° °Yeast infection: °Gyne-lotrimin 7 °Monistat 7 ° ° °**If taking multiple medications, please check labels to avoid duplicating the same active ingredients °**take medication  as directed on the label °** Do not exceed 4000 mg of tylenol in 24 hours °**Do not take medications that contain aspirin or ibuprofen ° ° ° ° °

## 2015-01-08 NOTE — Progress Notes (Signed)
27 weeks with nasal congestion and sore throat x 4 days.  She has had some mild cough that is nonproductive.  She had fever x 1 day on Saturday but none lately.  She has nausea, no vomiting.  Her children have both been sick and their worst symptom is cough.  Son is on second antibiotic and had fever over the weekend also.   She reports good fetal movement and denies VB, LOF, dysuria Respiratory: NO distress, Lungs are clear to auscultation without any wheezes, rales or consolidation.  Cardiovascular: Heart is RRR HEENT: ears are clear bilat, nares are boggy, pink, oropharynx with significant erythema but no exudate Lymph: no cervical lymphadenopathy A: URI - likely viral  [redacted] week gestation  P: Magic Mouthwash Sudafed PRN Tylenol PRN Safe OTC med list provided.   Monitor and return if symptoms worsen.   Keep appt for 28 week visit.

## 2015-01-09 ENCOUNTER — Telehealth: Payer: Self-pay | Admitting: *Deleted

## 2015-01-09 DIAGNOSIS — J0110 Acute frontal sinusitis, unspecified: Secondary | ICD-10-CM

## 2015-01-09 MED ORDER — AZITHROMYCIN 250 MG PO TABS: 250.0000 mg | ORAL_TABLET | Freq: Every day | ORAL | Status: DC

## 2015-01-09 NOTE — Telephone Encounter (Signed)
Pt stopped by and needed something sent in for a possible sinus infection and ear infection.  I have sent in an antibiotic.

## 2015-01-16 ENCOUNTER — Encounter: Payer: Medicaid Other | Admitting: Obstetrics & Gynecology

## 2015-01-18 ENCOUNTER — Ambulatory Visit (INDEPENDENT_AMBULATORY_CARE_PROVIDER_SITE_OTHER): Payer: Medicaid Other | Admitting: Obstetrics and Gynecology

## 2015-01-18 ENCOUNTER — Encounter: Payer: Self-pay | Admitting: Obstetrics and Gynecology

## 2015-01-18 VITALS — BP 118/68 | HR 81 | Wt 229.4 lb

## 2015-01-18 DIAGNOSIS — Z3493 Encounter for supervision of normal pregnancy, unspecified, third trimester: Secondary | ICD-10-CM

## 2015-01-18 DIAGNOSIS — Z23 Encounter for immunization: Secondary | ICD-10-CM | POA: Diagnosis not present

## 2015-01-18 DIAGNOSIS — O99213 Obesity complicating pregnancy, third trimester: Secondary | ICD-10-CM

## 2015-01-18 DIAGNOSIS — F17210 Nicotine dependence, cigarettes, uncomplicated: Secondary | ICD-10-CM

## 2015-01-18 DIAGNOSIS — O99333 Smoking (tobacco) complicating pregnancy, third trimester: Secondary | ICD-10-CM | POA: Diagnosis not present

## 2015-01-18 DIAGNOSIS — O99331 Smoking (tobacco) complicating pregnancy, first trimester: Secondary | ICD-10-CM

## 2015-01-18 DIAGNOSIS — F17200 Nicotine dependence, unspecified, uncomplicated: Secondary | ICD-10-CM

## 2015-01-18 DIAGNOSIS — Z8659 Personal history of other mental and behavioral disorders: Secondary | ICD-10-CM

## 2015-01-18 DIAGNOSIS — E669 Obesity, unspecified: Secondary | ICD-10-CM

## 2015-01-18 DIAGNOSIS — O9989 Other specified diseases and conditions complicating pregnancy, childbirth and the puerperium: Secondary | ICD-10-CM

## 2015-01-18 LAB — CBC
HCT: 36.3 % (ref 36.0–46.0)
Hemoglobin: 12.1 g/dL (ref 12.0–15.0)
MCH: 31.7 pg (ref 26.0–34.0)
MCHC: 33.3 g/dL (ref 30.0–36.0)
MCV: 95 fL (ref 78.0–100.0)
MPV: 10 fL (ref 8.6–12.4)
Platelets: 247 10*3/uL (ref 150–400)
RBC: 3.82 MIL/uL — ABNORMAL LOW (ref 3.87–5.11)
RDW: 13.3 % (ref 11.5–15.5)
WBC: 8.8 10*3/uL (ref 4.0–10.5)

## 2015-01-18 NOTE — Progress Notes (Signed)
Subjective:   Robyn Ortiz is a 29 y.o. G3P2002 at 6112w0d being seen today for her obstetrical visit.  Patient reports no complaints.   Contractions: Contractions: Not present.   Vaginal Bleeding Vag. Bleeding: None.   Fetal Movement: Movement: Present.   Denies contractions, vaginal bleeding or leaking of fluid.  Reports good fetal movement.  The following portions of the patient's history were reviewed and updated as appropriate: allergies, current medications, past family history, past medical history, past social history, past surgical history and problem list.   Objective:  BP 118/68 mmHg  Pulse 81  Wt 229 lb 6.4 oz (104.055 kg)  LMP 06/20/2014 (Approximate) Fetal Heart Rate: Fetal Heart Rate (bpm): 134  Fundal Height:    Fetal Movement: Movement: Present  Fetal Presentation:     Abdomen: Soft, gravid, appropriate for gestational age.  Pain/Pressure: Pain/Pressure: Absent     Vaginal: Vaginal Bleeding Vag. Bleeding: None   Discharge: Vag D/C Character: Thin  Cervix: Dilation:   Effacement:   Station:    Extremities: Edema: Edema: Trace   Urinalysis: Protein: Urine Protein: Negative Glucose: Urine Glucose: Negative No results found for this or any previous visit (from the past 24 hour(s)).   Assessment and Plan:   Pregnancy:  G3P2002 at 2212w0d  1. Tobacco smoking affecting pregnancy in first trimester, antepartum Patient is still trying to quit but has reduced the numbers of cigarettes per day  2. Supervision of normal pregnancy, third trimester FM/PTL precautions reviewed. 1hr GCT, TDap and labs today Patient will be contacted with any abnormal results Patient to sign BTL papers today  3. Obesity (BMI 30-39.9)   4. H/O postpartum depression, currently pregnant Doing well currently without medication   Preterm labor symptoms: vaginal bleeding, contractions and leaking of fluid reviewed in detail.  Fetal movement precautions reviewed.  Follow up in 2  weeks.   Catalina AntiguaPeggy Chrisma Hurlock, MD

## 2015-01-18 NOTE — Addendum Note (Signed)
Addended by: Tandy GawHINTON, Jadene Stemmer C on: 01/18/2015 10:05 AM   Modules accepted: Orders

## 2015-01-19 LAB — RPR

## 2015-01-19 LAB — HIV ANTIBODY (ROUTINE TESTING W REFLEX): HIV 1&2 Ab, 4th Generation: NONREACTIVE

## 2015-01-22 LAB — GLUCOSE TOLERANCE, 1 HOUR (50G) W/O FASTING: Glucose, 1 Hour GTT: 150 mg/dL — ABNORMAL HIGH (ref 70–140)

## 2015-02-01 ENCOUNTER — Encounter: Payer: Self-pay | Admitting: Obstetrics and Gynecology

## 2015-02-01 ENCOUNTER — Ambulatory Visit (INDEPENDENT_AMBULATORY_CARE_PROVIDER_SITE_OTHER): Payer: Medicaid Other | Admitting: Obstetrics and Gynecology

## 2015-02-01 VITALS — BP 104/75 | HR 82 | Wt 231.0 lb

## 2015-02-01 DIAGNOSIS — O9989 Other specified diseases and conditions complicating pregnancy, childbirth and the puerperium: Secondary | ICD-10-CM

## 2015-02-01 DIAGNOSIS — Z3493 Encounter for supervision of normal pregnancy, unspecified, third trimester: Secondary | ICD-10-CM

## 2015-02-01 DIAGNOSIS — F17210 Nicotine dependence, cigarettes, uncomplicated: Secondary | ICD-10-CM

## 2015-02-01 DIAGNOSIS — O99331 Smoking (tobacco) complicating pregnancy, first trimester: Secondary | ICD-10-CM

## 2015-02-01 DIAGNOSIS — E669 Obesity, unspecified: Secondary | ICD-10-CM

## 2015-02-01 DIAGNOSIS — Z8659 Personal history of other mental and behavioral disorders: Secondary | ICD-10-CM

## 2015-02-01 NOTE — Progress Notes (Signed)
Subjective:  Robyn Ortiz is a 29 y.o. G3P2002 at [redacted]w[redacted]d being seen today for ongoing prenatal care.  Patient reports no complaints.  Contractions: Not present.  Vag. Bleeding: None. Movement: Present. Denies leaking of fluid.   The following portions of the patient's history were reviewed and updated as appropriate: allergies, current medications, past family history, past medical history, past social history, past surgical history and problem list.   Objective:   Filed Vitals:   02/01/15 0913  BP: 104/75  Pulse: 82  Weight: 231 lb (104.781 kg)    Fetal Status: Fetal Heart Rate (bpm): 130   Movement: Present     General:  Alert, oriented and cooperative. Patient is in no acute distress.  Skin: Skin is warm and dry. No rash noted.   Cardiovascular: Normal heart rate noted  Respiratory: Effort and breath sounds normal, no problems with respiration noted  Abdomen: Soft, gravid, appropriate for gestational age. Pain/Pressure: Present     Vaginal: Vag. Bleeding: None.    Vag D/C Character: Thin  Cervix: Not evaluated  Extremities: Normal range of motion.  Edema: Trace  Mental Status: Normal mood and affect. Normal behavior. Normal judgment and thought content.   Urinalysis: Urine Protein: Negative Urine Glucose: Negative  Assessment and Plan:  Pregnancy: G3P2002 at [redacted]w[redacted]d  1. Tobacco smoking affecting pregnancy in first trimester, antepartum In the process of quitting  2. Supervision of normal pregnancy, third trimester Abnormal 1 hr glucola screen. Patient will return on Monday for 3 hour test  3. Obesity (BMI 30-39.9)   4. H/O postpartum depression, currently pregnant Stable   Preterm labor symptoms and general obstetric precautions including but not limited to vaginal bleeding, contractions, leaking of fluid and fetal movement were reviewed in detail with the patient.  Please refer to After Visit Summary for other counseling recommendations.   No Follow-up on  file.   Catalina Antigua, MD

## 2015-02-04 ENCOUNTER — Other Ambulatory Visit (INDEPENDENT_AMBULATORY_CARE_PROVIDER_SITE_OTHER): Payer: Medicaid Other | Admitting: *Deleted

## 2015-02-04 DIAGNOSIS — R7309 Other abnormal glucose: Secondary | ICD-10-CM

## 2015-02-04 DIAGNOSIS — R7302 Impaired glucose tolerance (oral): Secondary | ICD-10-CM

## 2015-02-04 NOTE — Progress Notes (Signed)
Pt here today for a 3 hr GTT. Patient has very difficult veins.  I tried several attempts and only was successful on her fasting draw.  I tried her 1 hour and was not successful so I decided to do finger sticks due to her being a difficult stick.  Her fasting was 86, her 1 hour was 195, her 2 hour was 106 and 3 hour was 103.    86 195 106 103

## 2015-02-05 LAB — GLUCOSE TOLERANCE, 3 HOURS
GLUCOSE 2 HOUR: 106
Glucose 1 Hour: 195
Glucose 3 Hour: 103
Glucose Fasting: 86

## 2015-02-05 NOTE — Addendum Note (Signed)
Addended by: Arne Cleveland on: 02/05/2015 08:25 AM   Modules accepted: Orders

## 2015-02-15 ENCOUNTER — Encounter: Payer: Self-pay | Admitting: Family Medicine

## 2015-02-15 ENCOUNTER — Ambulatory Visit (INDEPENDENT_AMBULATORY_CARE_PROVIDER_SITE_OTHER): Payer: Medicaid Other | Admitting: Family Medicine

## 2015-02-15 VITALS — BP 105/69 | HR 75 | Wt 225.0 lb

## 2015-02-15 DIAGNOSIS — Z3493 Encounter for supervision of normal pregnancy, unspecified, third trimester: Secondary | ICD-10-CM

## 2015-02-15 DIAGNOSIS — Z3483 Encounter for supervision of other normal pregnancy, third trimester: Secondary | ICD-10-CM

## 2015-02-15 NOTE — Progress Notes (Signed)
Subjective:  Robyn Ortiz is a 29 y.o. G3P2002 at 363w0d being seen today for ongoing prenatal care.  Patient reports no complaints.  Contractions: Not present.  Vag. Bleeding: None. Movement: Present. Denies leaking of fluid.   The following portions of the patient's history were reviewed and updated as appropriate: allergies, current medications, past family history, past medical history, past social history, past surgical history and problem list.   Objective:   Filed Vitals:   02/15/15 0946  BP: 105/69  Pulse: 75  Weight: 225 lb (102.059 kg)    Fetal Status: Fetal Heart Rate (bpm): + ON U/S Fundal Height: 33 cm Movement: Present     General:  Alert, oriented and cooperative. Patient is in no acute distress.  Skin: Skin is warm and dry. No rash noted.   Cardiovascular: Normal heart rate noted  Respiratory: Normal respiratory effort, no problems with respiration noted  Abdomen: Soft, gravid, appropriate for gestational age. Pain/Pressure: Present     Vaginal: Vag. Bleeding: None.    Vag D/C Character: Thin  Cervix: Not evaluated        Extremities: Normal range of motion.  Edema: Trace  Mental Status: Normal mood and affect. Normal behavior. Normal judgment and thought content.   Urinalysis: Urine Protein: Negative Urine Glucose: Negative  Assessment and Plan:  Pregnancy: G3P2002 at 8263w0d  1. Supervision of normal pregnancy, third trimester Continue routine prenatal care.  Preterm labor symptoms and general obstetric precautions including but not limited to vaginal bleeding, contractions, leaking of fluid and fetal movement were reviewed in detail with the patient.  Please refer to After Visit Summary for other counseling recommendations.   Return in 1 week (on 02/22/2015).   Reva Boresanya S Jaidyn Usery, MD

## 2015-02-15 NOTE — Patient Instructions (Signed)
Third Trimester of Pregnancy The third trimester is from week 29 through week 42, months 7 through 9. The third trimester is a time when the fetus is growing rapidly. At the end of the ninth month, the fetus is about 20 inches in length and weighs 6-10 pounds.  BODY CHANGES Your body goes through many changes during pregnancy. The changes vary from woman to woman.   Your weight will continue to increase. You can expect to gain 25-35 pounds (11-16 kg) by the end of the pregnancy.  You may begin to get stretch marks on your hips, abdomen, and breasts.  You may urinate more often because the fetus is moving lower into your pelvis and pressing on your bladder.  You may develop or continue to have heartburn as a result of your pregnancy.  You may develop constipation because certain hormones are causing the muscles that push waste through your intestines to slow down.  You may develop hemorrhoids or swollen, bulging veins (varicose veins).  You may have pelvic pain because of the weight gain and pregnancy hormones relaxing your joints between the bones in your pelvis. Backaches may result from overexertion of the muscles supporting your posture.  You may have changes in your hair. These can include thickening of your hair, rapid growth, and changes in texture. Some women also have hair loss during or after pregnancy, or hair that feels dry or thin. Your hair will most likely return to normal after your baby is born.  Your breasts will continue to grow and be tender. A yellow discharge may leak from your breasts called colostrum.  Your belly button may stick out.  You may feel short of breath because of your expanding uterus.  You may notice the fetus "dropping," or moving lower in your abdomen.  You may have a bloody mucus discharge. This usually occurs a few days to a week before labor begins.  Your cervix becomes thin and soft (effaced) near your due date. WHAT TO EXPECT AT YOUR  PRENATAL EXAMS  You will have prenatal exams every 2 weeks until week 36. Then, you will have weekly prenatal exams. During a routine prenatal visit:  You will be weighed to make sure you and the fetus are growing normally.  Your blood pressure is taken.  Your abdomen will be measured to track your baby's growth.  The fetal heartbeat will be listened to.  Any test results from the previous visit will be discussed.  You may have a cervical check near your due date to see if you have effaced. At around 36 weeks, your caregiver will check your cervix. At the same time, your caregiver will also perform a test on the secretions of the vaginal tissue. This test is to determine if a type of bacteria, Group B streptococcus, is present. Your caregiver will explain this further. Your caregiver may ask you:  What your birth plan is.  How you are feeling.  If you are feeling the baby move.  If you have had any abnormal symptoms, such as leaking fluid, bleeding, severe headaches, or abdominal cramping.  If you have any questions. Other tests or screenings that may be performed during your third trimester include:  Blood tests that check for low iron levels (anemia).  Fetal testing to check the health, activity level, and growth of the fetus. Testing is done if you have certain medical conditions or if there are problems during the pregnancy. FALSE LABOR You may feel small, irregular contractions that   eventually go away. These are called Braxton Hicks contractions, or false labor. Contractions may last for hours, days, or even weeks before true labor sets in. If contractions come at regular intervals, intensify, or become painful, it is best to be seen by your caregiver.  SIGNS OF LABOR   Menstrual-like cramps.  Contractions that are 5 minutes apart or less.  Contractions that start on the top of the uterus and spread down to the lower abdomen and back.  A sense of increased pelvic  pressure or back pain.  A watery or bloody mucus discharge that comes from the vagina. If you have any of these signs before the 37th week of pregnancy, call your caregiver right away. You need to go to the hospital to get checked immediately. HOME CARE INSTRUCTIONS   Avoid all smoking, herbs, alcohol, and unprescribed drugs. These chemicals affect the formation and growth of the baby.  Follow your caregiver's instructions regarding medicine use. There are medicines that are either safe or unsafe to take during pregnancy.  Exercise only as directed by your caregiver. Experiencing uterine cramps is a good sign to stop exercising.  Continue to eat regular, healthy meals.  Wear a good support bra for breast tenderness.  Do not use hot tubs, steam rooms, or saunas.  Wear your seat belt at all times when driving.  Avoid raw meat, uncooked cheese, cat litter boxes, and soil used by cats. These carry germs that can cause birth defects in the baby.  Take your prenatal vitamins.  Try taking a stool softener (if your caregiver approves) if you develop constipation. Eat more high-fiber foods, such as fresh vegetables or fruit and whole grains. Drink plenty of fluids to keep your urine clear or pale yellow.  Take warm sitz baths to soothe any pain or discomfort caused by hemorrhoids. Use hemorrhoid cream if your caregiver approves.  If you develop varicose veins, wear support hose. Elevate your feet for 15 minutes, 3-4 times a day. Limit salt in your diet.  Avoid heavy lifting, wear low heal shoes, and practice good posture.  Rest a lot with your legs elevated if you have leg cramps or low back pain.  Visit your dentist if you have not gone during your pregnancy. Use a soft toothbrush to brush your teeth and be gentle when you floss.  A sexual relationship may be continued unless your caregiver directs you otherwise.  Do not travel far distances unless it is absolutely necessary and only  with the approval of your caregiver.  Take prenatal classes to understand, practice, and ask questions about the labor and delivery.  Make a trial run to the hospital.  Pack your hospital bag.  Prepare the baby's nursery.  Continue to go to all your prenatal visits as directed by your caregiver. SEEK MEDICAL CARE IF:  You are unsure if you are in labor or if your water has broken.  You have dizziness.  You have mild pelvic cramps, pelvic pressure, or nagging pain in your abdominal area.  You have persistent nausea, vomiting, or diarrhea.  You have a bad smelling vaginal discharge.  You have pain with urination. SEEK IMMEDIATE MEDICAL CARE IF:   You have a fever.  You are leaking fluid from your vagina.  You have spotting or bleeding from your vagina.  You have severe abdominal cramping or pain.  You have rapid weight loss or gain.  You have shortness of breath with chest pain.  You notice sudden or extreme swelling   of your face, hands, ankles, feet, or legs.  You have not felt your baby move in over an hour.  You have severe headaches that do not go away with medicine.  You have vision changes. Document Released: 07/28/2001 Document Revised: 08/08/2013 Document Reviewed: 10/04/2012 [employer] Patient Information 2015 [employer]. This information is not intended to replace advice given to you by your health care provider. Make sure you discuss any questions you have with your health care provider.  Contraception Choices Contraception (birth control) is the use of any methods or devices to prevent pregnancy. Below are some methods to help avoid pregnancy. HORMONAL METHODS   Contraceptive implant. This is a thin, plastic tube containing progesterone hormone. It does not contain estrogen hormone. Your health care provider inserts the tube in the inner part of the upper arm. The tube can remain in place for up to 3 years. After 3 years, the implant must be removed.  The implant prevents the ovaries from releasing an egg (ovulation), thickens the cervical mucus to prevent sperm from entering the uterus, and thins the lining of the inside of the uterus.  Progesterone-only injections. These injections are given every 3 months by your health care provider to prevent pregnancy. This synthetic progesterone hormone stops the ovaries from releasing eggs. It also thickens cervical mucus and changes the uterine lining. This makes it harder for sperm to survive in the uterus.  Birth control pills. These pills contain estrogen and progesterone hormone. They work by preventing the ovaries from releasing eggs (ovulation). They also cause the cervical mucus to thicken, preventing the sperm from entering the uterus. Birth control pills are prescribed by a health care provider.Birth control pills can also be used to treat heavy periods.  Minipill. This type of birth control pill contains only the progesterone hormone. They are taken every day of each month and must be prescribed by your health care provider.  Birth control patch. The patch contains hormones similar to those in birth control pills. It must be changed once a week and is prescribed by a health care provider.  Vaginal ring. The ring contains hormones similar to those in birth control pills. It is left in the vagina for 3 weeks, removed for 1 week, and then a new one is put back in place. The patient must be comfortable inserting and removing the ring from the vagina.A health care provider's prescription is necessary.  Emergency contraception. Emergency contraceptives prevent pregnancy after unprotected sexual intercourse. This pill can be taken right after sex or up to 5 days after unprotected sex. It is most effective the sooner you take the pills after having sexual intercourse. Most emergency contraceptive pills are available without a prescription. Check with your pharmacist. Do not use emergency contraception as  your only form of birth control. BARRIER METHODS   Female condom. This is a thin sheath (latex or rubber) that is worn over the penis during sexual intercourse. It can be used with spermicide to increase effectiveness.  Female condom. This is a soft, loose-fitting sheath that is put into the vagina before sexual intercourse.  Diaphragm. This is a soft, latex, dome-shaped barrier that must be fitted by a health care provider. It is inserted into the vagina, along with a spermicidal jelly. It is inserted before intercourse. The diaphragm should be left in the vagina for 6 to 8 hours after intercourse.  Cervical cap. This is a round, soft, latex or plastic cup that fits over the cervix and must be   fitted by a health care provider. The cap can be left in place for up to 48 hours after intercourse.  Sponge. This is a soft, circular piece of polyurethane foam. The sponge has spermicide in it. It is inserted into the vagina after wetting it and before sexual intercourse.  Spermicides. These are chemicals that kill or block sperm from entering the cervix and uterus. They come in the form of creams, jellies, suppositories, foam, or tablets. They do not require a prescription. They are inserted into the vagina with an applicator before having sexual intercourse. The process must be repeated every time you have sexual intercourse. INTRAUTERINE CONTRACEPTION  Intrauterine device (IUD). This is a T-shaped device that is put in a woman's uterus during a menstrual period to prevent pregnancy. There are 2 types:  Copper IUD. This type of IUD is wrapped in copper wire and is placed inside the uterus. Copper makes the uterus and fallopian tubes produce a fluid that kills sperm. It can stay in place for 10 years.  Hormone IUD. This type of IUD contains the hormone progestin (synthetic progesterone). The hormone thickens the cervical mucus and prevents sperm from entering the uterus, and it also thins the uterine  lining to prevent implantation of a fertilized egg. The hormone can weaken or kill the sperm that get into the uterus. It can stay in place for 3-5 years, depending on which type of IUD is used. PERMANENT METHODS OF CONTRACEPTION  Female tubal ligation. This is when the woman's fallopian tubes are surgically sealed, tied, or blocked to prevent the egg from traveling to the uterus.  Hysteroscopic sterilization. This involves placing a small coil or insert into each fallopian tube. Your doctor uses a technique called hysteroscopy to do the procedure. The device causes scar tissue to form. This results in permanent blockage of the fallopian tubes, so the sperm cannot fertilize the egg. It takes about 3 months after the procedure for the tubes to become blocked. You must use another form of birth control for these 3 months.  Female sterilization. This is when the female has the tubes that carry sperm tied off (vasectomy).This blocks sperm from entering the vagina during sexual intercourse. After the procedure, the man can still ejaculate fluid (semen). NATURAL PLANNING METHODS  Natural family planning. This is not having sexual intercourse or using a barrier method (condom, diaphragm, cervical cap) on days the woman could become pregnant.  Calendar method. This is keeping track of the length of each menstrual cycle and identifying when you are fertile.  Ovulation method. This is avoiding sexual intercourse during ovulation.  Symptothermal method. This is avoiding sexual intercourse during ovulation, using a thermometer and ovulation symptoms.  Post-ovulation method. This is timing sexual intercourse after you have ovulated. Regardless of which type or method of contraception you choose, it is important that you use condoms to protect against the transmission of sexually transmitted infections (STIs). Talk with your health care provider about which form of contraception is most appropriate for  you. Document Released: 08/03/2005 Document Revised: 08/08/2013 Document Reviewed: 01/26/2013 [employer] Patient Information 2015 [employer]. This information is not intended to replace advice given to you by your health care provider. Make sure you discuss any questions you have with your health care provider.  Breastfeeding Deciding to breastfeed is one of the best choices you can make for you and your baby. A change in hormones during pregnancy causes your breast tissue to grow and increases the number and size of   your milk ducts. These hormones also allow proteins, sugars, and fats from your blood supply to make breast milk in your milk-producing glands. Hormones prevent breast milk from being released before your baby is born as well as prompt milk flow after birth. Once breastfeeding has begun, thoughts of your baby, as well as his or her sucking or crying, can stimulate the release of milk from your milk-producing glands.  BENEFITS OF BREASTFEEDING For Your Baby  Your first milk (colostrum) helps your baby's digestive system function better.   There are antibodies in your milk that help your baby fight off infections.   Your baby has a lower incidence of asthma, allergies, and sudden infant death syndrome.   The nutrients in breast milk are better for your baby than infant formulas and are designed uniquely for your baby's needs.   Breast milk improves your baby's brain development.   Your baby is less likely to develop other conditions, such as childhood obesity, asthma, or type 2 diabetes mellitus.  For You   Breastfeeding helps to create a very special bond between you and your baby.   Breastfeeding is convenient. Breast milk is always available at the correct temperature and costs nothing.   Breastfeeding helps to burn calories and helps you lose the weight gained during pregnancy.   Breastfeeding makes your uterus contract to its prepregnancy size faster and slows  bleeding (lochia) after you give birth.   Breastfeeding helps to lower your risk of developing type 2 diabetes mellitus, osteoporosis, and breast or ovarian cancer later in life. SIGNS THAT YOUR BABY IS HUNGRY Early Signs of Hunger  Increased alertness or activity.  Stretching.  Movement of the head from side to side.  Movement of the head and opening of the mouth when the corner of the mouth or cheek is stroked (rooting).  Increased sucking sounds, smacking lips, cooing, sighing, or squeaking.  Hand-to-mouth movements.  Increased sucking of fingers or hands. Late Signs of Hunger  Fussing.  Intermittent crying. Extreme Signs of Hunger Signs of extreme hunger will require calming and consoling before your baby will be able to breastfeed successfully. Do not wait for the following signs of extreme hunger to occur before you initiate breastfeeding:   Restlessness.  A loud, strong cry.   Screaming. BREASTFEEDING BASICS Breastfeeding Initiation  Find a comfortable place to sit or lie down, with your neck and back well supported.  Place a pillow or rolled up blanket under your baby to bring him or her to the level of your breast (if you are seated). Nursing pillows are specially designed to help support your arms and your baby while you breastfeed.  Make sure that your baby's abdomen is facing your abdomen.   Gently massage your breast. With your fingertips, massage from your chest wall toward your nipple in a circular motion. This encourages milk flow. You may need to continue this action during the feeding if your milk flows slowly.  Support your breast with 4 fingers underneath and your thumb above your nipple. Make sure your fingers are well away from your nipple and your baby's mouth.   Stroke your baby's lips gently with your finger or nipple.   When your baby's mouth is open wide enough, quickly bring your baby to your breast, placing your entire nipple and as  much of the colored area around your nipple (areola) as possible into your baby's mouth.   More areola should be visible above your baby's upper lip than below   the lower lip.   Your baby's tongue should be between his or her lower gum and your breast.   Ensure that your baby's mouth is correctly positioned around your nipple (latched). Your baby's lips should create a seal on your breast and be turned out (everted).  It is common for your baby to suck about 2-3 minutes in order to start the flow of breast milk. Latching Teaching your baby how to latch on to your breast properly is very important. An improper latch can cause nipple pain and decreased milk supply for you and poor weight gain in your baby. Also, if your baby is not latched onto your nipple properly, he or she may swallow some air during feeding. This can make your baby fussy. Burping your baby when you switch breasts during the feeding can help to get rid of the air. However, teaching your baby to latch on properly is still the best way to prevent fussiness from swallowing air while breastfeeding. Signs that your baby has successfully latched on to your nipple:    Silent tugging or silent sucking, without causing you pain.   Swallowing heard between every 3-4 sucks.    Muscle movement above and in front of his or her ears while sucking.  Signs that your baby has not successfully latched on to nipple:   Sucking sounds or smacking sounds from your baby while breastfeeding.  Nipple pain. If you think your baby has not latched on correctly, slip your finger into the corner of your baby's mouth to break the suction and place it between your baby's gums. Attempt breastfeeding initiation again. Signs of Successful Breastfeeding Signs from your baby:   A gradual decrease in the number of sucks or complete cessation of sucking.   Falling asleep.   Relaxation of his or her body.   Retention of a small amount of milk in  his or her mouth.   Letting go of your breast by himself or herself. Signs from you:  Breasts that have increased in firmness, weight, and size 1-3 hours after feeding.   Breasts that are softer immediately after breastfeeding.  Increased milk volume, as well as a change in milk consistency and color by the fifth day of breastfeeding.   Nipples that are not sore, cracked, or bleeding. Signs That Your Baby is Getting Enough Milk  Wetting at least 3 diapers in a 24-hour period. The urine should be clear and pale yellow by age 5 days.  At least 3 stools in a 24-hour period by age 5 days. The stool should be soft and yellow.  At least 3 stools in a 24-hour period by age 7 days. The stool should be seedy and yellow.  No loss of weight greater than 10% of birth weight during the first 3 days of age.  Average weight gain of 4-7 ounces (113-198 g) per week after age 4 days.  Consistent daily weight gain by age 5 days, without weight loss after the age of 2 weeks. After a feeding, your baby may spit up a small amount. This is common. BREASTFEEDING FREQUENCY AND DURATION Frequent feeding will help you make more milk and can prevent sore nipples and breast engorgement. Breastfeed when you feel the need to reduce the fullness of your breasts or when your baby shows signs of hunger. This is called "breastfeeding on demand." Avoid introducing a pacifier to your baby while you are working to establish breastfeeding (the first 4-6 weeks after your baby is born).   After this time you may choose to use a pacifier. Research has shown that pacifier use during the first year of a baby's life decreases the risk of sudden infant death syndrome (SIDS). Allow your baby to feed on each breast as long as he or she wants. Breastfeed until your baby is finished feeding. When your baby unlatches or falls asleep while feeding from the first breast, offer the second breast. Because newborns are often sleepy in the  first few weeks of life, you may need to awaken your baby to get him or her to feed. Breastfeeding times will vary from baby to baby. However, the following rules can serve as a guide to help you ensure that your baby is properly fed:  Newborns (babies 4 weeks of age or younger) may breastfeed every 1-3 hours.  Newborns should not go longer than 3 hours during the day or 5 hours during the night without breastfeeding.  You should breastfeed your baby a minimum of 8 times in a 24-hour period until you begin to introduce solid foods to your baby at around 6 months of age. BREAST MILK PUMPING Pumping and storing breast milk allows you to ensure that your baby is exclusively fed your breast milk, even at times when you are unable to breastfeed. This is especially important if you are going back to work while you are still breastfeeding or when you are not able to be present during feedings. Your lactation consultant can give you guidelines on how long it is safe to store breast milk.  A breast pump is a machine that allows you to pump milk from your breast into a sterile bottle. The pumped breast milk can then be stored in a refrigerator or freezer. Some breast pumps are operated by hand, while others use electricity. Ask your lactation consultant which type will work best for you. Breast pumps can be purchased, but some hospitals and breastfeeding support groups lease breast pumps on a monthly basis. A lactation consultant can teach you how to hand express breast milk, if you prefer not to use a pump.  CARING FOR YOUR BREASTS WHILE YOU BREASTFEED Nipples can become dry, cracked, and sore while breastfeeding. The following recommendations can help keep your breasts moisturized and healthy:  Avoid using soap on your nipples.   Wear a supportive bra. Although not required, special nursing bras and tank tops are designed to allow access to your breasts for breastfeeding without taking off your entire bra  or top. Avoid wearing underwire-style bras or extremely tight bras.  Air dry your nipples for 3-4minutes after each feeding.   Use only cotton bra pads to absorb leaked breast milk. Leaking of breast milk between feedings is normal.   Use lanolin on your nipples after breastfeeding. Lanolin helps to maintain your skin's normal moisture barrier. If you use pure lanolin, you do not need to wash it off before feeding your baby again. Pure lanolin is not toxic to your baby. You may also hand express a few drops of breast milk and gently massage that milk into your nipples and allow the milk to air dry. In the first few weeks after giving birth, some women experience extremely full breasts (engorgement). Engorgement can make your breasts feel heavy, warm, and tender to the touch. Engorgement peaks within 3-5 days after you give birth. The following recommendations can help ease engorgement:  Completely empty your breasts while breastfeeding or pumping. You may want to start by applying warm, moist heat (in   the shower or with warm water-soaked hand towels) just before feeding or pumping. This increases circulation and helps the milk flow. If your baby does not completely empty your breasts while breastfeeding, pump any extra milk after he or she is finished.  Wear a snug bra (nursing or regular) or tank top for 1-2 days to signal your body to slightly decrease milk production.  Apply ice packs to your breasts, unless this is too uncomfortable for you.  Make sure that your baby is latched on and positioned properly while breastfeeding. If engorgement persists after 48 hours of following these recommendations, contact your health care provider or a lactation consultant. OVERALL HEALTH CARE RECOMMENDATIONS WHILE BREASTFEEDING  Eat healthy foods. Alternate between meals and snacks, eating 3 of each per day. Because what you eat affects your breast milk, some of the foods may make your baby more irritable  than usual. Avoid eating these foods if you are sure that they are negatively affecting your baby.  Drink milk, fruit juice, and water to satisfy your thirst (about 10 glasses a day).   Rest often, relax, and continue to take your prenatal vitamins to prevent fatigue, stress, and anemia.  Continue breast self-awareness checks.  Avoid chewing and smoking tobacco.  Avoid alcohol and drug use. Some medicines that may be harmful to your baby can pass through breast milk. It is important to ask your health care provider before taking any medicine, including all over-the-counter and prescription medicine as well as vitamin and herbal supplements. It is possible to become pregnant while breastfeeding. If birth control is desired, ask your health care provider about options that will be safe for your baby. SEEK MEDICAL CARE IF:   You feel like you want to stop breastfeeding or have become frustrated with breastfeeding.  You have painful breasts or nipples.  Your nipples are cracked or bleeding.  Your breasts are red, tender, or warm.  You have a swollen area on either breast.  You have a fever or chills.  You have nausea or vomiting.  You have drainage other than breast milk from your nipples.  Your breasts do not become full before feedings by the fifth day after you give birth.  You feel sad and depressed.  Your baby is too sleepy to eat well.  Your baby is having trouble sleeping.   Your baby is wetting less than 3 diapers in a 24-hour period.  Your baby has less than 3 stools in a 24-hour period.  Your baby's skin or the white part of his or her eyes becomes yellow.   Your baby is not gaining weight by 5 days of age. SEEK IMMEDIATE MEDICAL CARE IF:   Your baby is overly tired (lethargic) and does not want to wake up and feed.  Your baby develops an unexplained fever. Document Released: 08/03/2005 Document Revised: 08/08/2013 Document Reviewed: 01/25/2013 ExitCare  Patient Information 2015 [employer]. This information is not intended to replace advice given to you by your health care provider. Make sure you discuss any questions you have with your health care provider.  

## 2015-03-01 ENCOUNTER — Ambulatory Visit (INDEPENDENT_AMBULATORY_CARE_PROVIDER_SITE_OTHER): Payer: Medicaid Other | Admitting: Obstetrics & Gynecology

## 2015-03-01 VITALS — BP 100/69 | HR 81 | Wt 228.0 lb

## 2015-03-01 DIAGNOSIS — Z3493 Encounter for supervision of normal pregnancy, unspecified, third trimester: Secondary | ICD-10-CM

## 2015-03-01 NOTE — Progress Notes (Signed)
Subjective:  Robyn Ortiz is a 29 y.o. G3P2002 at 8086w0d being seen today for ongoing prenatal care.  Patient reports no complaints.  Contractions: Irregular.  Vag. Bleeding: None. Movement: Present. Denies leaking of fluid.   The following portions of the patient's history were reviewed and updated as appropriate: allergies, current medications, past family history, past medical history, past social history, past surgical history and problem list.   Objective:   Filed Vitals:   03/01/15 0946  BP: 100/69  Pulse: 81  Weight: 228 lb (103.42 kg)    Fetal Status: Fetal Heart Rate (bpm): 138 Fundal Height: 35 cm Movement: Present     General:  Alert, oriented and cooperative. Patient is in no acute distress.  Skin: Skin is warm and dry. No rash noted.   Cardiovascular: Normal heart rate noted  Respiratory: Normal respiratory effort, no problems with respiration noted  Abdomen: Soft, gravid, appropriate for gestational age. Pain/Pressure: Present     Vaginal: Vag. Bleeding: None.    Vag D/C Character: Thin  Cervix: Not evaluated        Extremities: Normal range of motion.  Edema: Trace  Mental Status: Normal mood and affect. Normal behavior. Normal judgment and thought content.   Urinalysis: Urine Protein: Negative Urine Glucose: Negative  Assessment and Plan:  Pregnancy: G3P2002 at 4186w0d  1. Supervision of normal pregnancy, third trimester Preterm labor symptoms and general obstetric precautions including but not limited to vaginal bleeding, contractions, leaking of fluid and fetal movement were reviewed in detail with the patient. Please refer to After Visit Summary for other counseling recommendations.  Return in about 1 week (around 03/08/2015) for OB visit and pelvic cultures.   Tereso NewcomerUgonna A Donya Tomaro, MD

## 2015-03-01 NOTE — Patient Instructions (Signed)
Return to clinic for any obstetric concerns or go to MAU for evaluation  

## 2015-03-08 ENCOUNTER — Ambulatory Visit (INDEPENDENT_AMBULATORY_CARE_PROVIDER_SITE_OTHER): Payer: Medicaid Other | Admitting: Obstetrics & Gynecology

## 2015-03-08 VITALS — BP 103/70 | HR 73 | Wt 230.0 lb

## 2015-03-08 DIAGNOSIS — Z3483 Encounter for supervision of other normal pregnancy, third trimester: Secondary | ICD-10-CM

## 2015-03-08 DIAGNOSIS — Z3493 Encounter for supervision of normal pregnancy, unspecified, third trimester: Secondary | ICD-10-CM

## 2015-03-08 DIAGNOSIS — Z36 Encounter for antenatal screening of mother: Secondary | ICD-10-CM | POA: Diagnosis not present

## 2015-03-08 LAB — OB RESULTS CONSOLE GC/CHLAMYDIA
Chlamydia: NEGATIVE
Gonorrhea: NEGATIVE

## 2015-03-08 LAB — OB RESULTS CONSOLE GBS: STREP GROUP B AG: NEGATIVE

## 2015-03-08 NOTE — Progress Notes (Signed)
Subjective:  Robyn Ortiz is a 29 y.o. G3P2002 at [redacted]w[redacted]d being seen today for ongoing prenatal care.  Patient reports no complaints.  Contractions: Irregular.  Vag. Bleeding: None. Movement: Present. Denies leaking of fluid.   The following portions of the patient's history were reviewed and updated as appropriate: allergies, current medications, past family history, past medical history, past social history, past surgical history and problem list.   Objective:   Filed Vitals:   03/08/15 1044  BP: 103/70  Pulse: 73  Weight: 230 lb (104.327 kg)    Fetal Status:     Movement: Present     General:  Alert, oriented and cooperative. Patient is in no acute distress.  Skin: Skin is warm and dry. No rash noted.   Cardiovascular: Normal heart rate noted  Respiratory: Normal respiratory effort, no problems with respiration noted  Abdomen: Soft, gravid, appropriate for gestational age. Pain/Pressure: Present     Vaginal: Vag. Bleeding: None.    Vag D/C Character: Thick  Cervix: Not evaluated        Extremities: Normal range of motion.     Mental Status: Normal mood and affect. Normal behavior. Normal judgment and thought content.   Urinalysis:      Assessment and Plan:  Pregnancy: G3P2002 at [redacted]w[redacted]d  1. Supervision of normal pregnancy, third trimester Cervical cultures obtained today. Term labor symptoms and general obstetric precautions including but not limited to vaginal bleeding, contractions, leaking of fluid and fetal movement were reviewed in detail with the patient. Please refer to After Visit Summary for other counseling recommendations.  No Follow-up on file.   Allie Bossier, MD

## 2015-03-09 LAB — GC/CHLAMYDIA PROBE AMP
CT PROBE, AMP APTIMA: NEGATIVE
GC Probe RNA: NEGATIVE

## 2015-03-12 LAB — CULTURE, BETA STREP (GROUP B ONLY)

## 2015-03-15 ENCOUNTER — Ambulatory Visit (INDEPENDENT_AMBULATORY_CARE_PROVIDER_SITE_OTHER): Payer: Medicaid Other | Admitting: Obstetrics & Gynecology

## 2015-03-15 VITALS — BP 109/75 | HR 80 | Wt 232.0 lb

## 2015-03-15 DIAGNOSIS — Z3493 Encounter for supervision of normal pregnancy, unspecified, third trimester: Secondary | ICD-10-CM

## 2015-03-15 DIAGNOSIS — Z3483 Encounter for supervision of other normal pregnancy, third trimester: Secondary | ICD-10-CM

## 2015-03-15 NOTE — Progress Notes (Signed)
Subjective:  Robyn Ortiz is a 29 y.o. G3P2002 at [redacted]w[redacted]d being seen today for ongoing prenatal care.  Patient reports no complaints.  Contractions: Irregular.  Vag. Bleeding: None. Movement: Present. Denies leaking of fluid.   The following portions of the patient's history were reviewed and updated as appropriate: allergies, current medications, past family history, past medical history, past social history, past surgical history and problem list.   Objective:   Filed Vitals:   03/15/15 0924  BP: 109/75  Pulse: 80  Weight: 232 lb (105.235 kg)    Fetal Status: Fetal Heart Rate (bpm): 122 Fundal Height: 36 cm Movement: Present  Presentation: Vertex  General:  Alert, oriented and cooperative. Patient is in no acute distress.  Skin: Skin is warm and dry. No rash noted.   Cardiovascular: Normal heart rate noted  Respiratory: Normal respiratory effort, no problems with respiration noted  Abdomen: Soft, gravid, appropriate for gestational age. Pain/Pressure: Present     Vaginal: Vag. Bleeding: None.    Vag D/C Character: Thin  Cervix: Exam revealed Dilation: Closed Effacement (%): Thick Station: Ballotable  Extremities: Normal range of motion.  Edema: Trace  Mental Status: Normal mood and affect. Normal behavior. Normal judgment and thought content.   Urinalysis: Urine Protein: Negative Urine Glucose: Negative  Assessment and Plan:  Pregnancy: G3P2002 at [redacted]w[redacted]d  1. Supervision of normal pregnancy, third trimester  Term labor symptoms and general obstetric precautions including but not limited to vaginal bleeding, contractions, leaking of fluid and fetal movement were reviewed in detail with the patient. Please refer to After Visit Summary for other counseling recommendations.  Return in about 1 week (around 03/22/2015).   Allie Bossier, MD

## 2015-03-22 ENCOUNTER — Ambulatory Visit (INDEPENDENT_AMBULATORY_CARE_PROVIDER_SITE_OTHER): Payer: Self-pay | Admitting: Obstetrics & Gynecology

## 2015-03-22 VITALS — BP 109/74 | HR 70 | Wt 230.0 lb

## 2015-03-22 DIAGNOSIS — Z3483 Encounter for supervision of other normal pregnancy, third trimester: Secondary | ICD-10-CM

## 2015-03-22 DIAGNOSIS — Z3493 Encounter for supervision of normal pregnancy, unspecified, third trimester: Secondary | ICD-10-CM

## 2015-03-22 NOTE — Progress Notes (Signed)
Subjective:  Robyn Ortiz is a 29 y.o. G3P2002 at [redacted]w[redacted]d being seen today for ongoing prenatal care.  Patient reports no complaints.  Contractions: Irregular.  Vag. Bleeding: None. Movement: Present. Denies leaking of fluid.   The following portions of the patient's history were reviewed and updated as appropriate: allergies, current medications, past family history, past medical history, past social history, past surgical history and problem list.   Objective:   Filed Vitals:   03/22/15 0906  BP: 109/74  Pulse: 70  Weight: 230 lb (104.327 kg)    Fetal Status: Fetal Heart Rate (bpm): 139 Fundal Height: 37 cm Movement: Present  Presentation: Vertex  General:  Alert, oriented and cooperative. Patient is in no acute distress.  Skin: Skin is warm and dry. No rash noted.   Cardiovascular: Normal heart rate noted  Respiratory: Normal respiratory effort, no problems with respiration noted  Abdomen: Soft, gravid, appropriate for gestational age. Pain/Pressure: Present     Vaginal: Vag. Bleeding: None.    Vag D/C Character: Thin  Cervix: Exam revealed Dilation: 1 Effacement (%): 50 Station: -3  Extremities: Normal range of motion.  Edema: Trace  Mental Status: Normal mood and affect. Normal behavior. Normal judgment and thought content.   Urinalysis: Urine Protein: Trace Urine Glucose: Negative  Assessment and Plan:  Pregnancy: G3P2002 at [redacted]w[redacted]d  There are no diagnoses linked to this encounter. Term labor symptoms and general obstetric precautions including but not limited to vaginal bleeding, contractions, leaking of fluid and fetal movement were reviewed in detail with the patient. Please refer to After Visit Summary for other counseling recommendations.  Return in about 1 week (around 03/29/2015).   Allie Bossier, MD

## 2015-03-29 ENCOUNTER — Ambulatory Visit (INDEPENDENT_AMBULATORY_CARE_PROVIDER_SITE_OTHER): Payer: Self-pay | Admitting: Obstetrics & Gynecology

## 2015-03-29 VITALS — BP 109/77 | HR 74 | Wt 231.0 lb

## 2015-03-29 DIAGNOSIS — Z3493 Encounter for supervision of normal pregnancy, unspecified, third trimester: Secondary | ICD-10-CM

## 2015-03-29 DIAGNOSIS — Z3483 Encounter for supervision of other normal pregnancy, third trimester: Secondary | ICD-10-CM

## 2015-03-29 NOTE — Progress Notes (Signed)
Subjective:  Robyn Ortiz is a 29 y.o. G3P2002 at [redacted]w[redacted]d being seen today for ongoing prenatal care.  Patient reports no complaints.  Contractions: Irregular.  Vag. Bleeding: None. Movement: Present. Denies leaking of fluid.   The following portions of the patient's history were reviewed and updated as appropriate: allergies, current medications, past family history, past medical history, past social history, past surgical history and problem list.   Objective:   Filed Vitals:   03/29/15 0916  BP: 109/77  Pulse: 74  Weight: 231 lb (104.781 kg)    Fetal Status: Fetal Heart Rate (bpm): + on [place]   Movement: Present  Presentation: Vertex  General:  Alert, oriented and cooperative. Patient is in no acute distress.  Skin: Skin is warm and dry. No rash noted.   Cardiovascular: Normal heart rate noted  Respiratory: Normal respiratory effort, no problems with respiration noted  Abdomen: Soft, gravid, appropriate for gestational age. Pain/Pressure: Present     Pelvic: Vag. Bleeding: None Vag D/C Character: Thin   Cervical exam performed Dilation: 2 Effacement (%): 50 Station: -3  Extremities: Normal range of motion.  Edema: Trace  Mental Status: Normal mood and affect. Normal behavior. Normal judgment and thought content.   Urinalysis: Urine Protein: Negative Urine Glucose: Negative  Assessment and Plan:  Pregnancy: G3P2002 at [redacted]w[redacted]d  1. Supervision of normal pregnancy, third trimester   Term labor symptoms and general obstetric precautions including but not limited to vaginal bleeding, contractions, leaking of fluid and fetal movement were reviewed in detail with the patient.  Please refer to After Visit Summary for other counseling recommendations.  Return in about 1 week (around 04/05/2015).   Allie Bossier, MD

## 2015-04-05 ENCOUNTER — Encounter (HOSPITAL_COMMUNITY): Payer: Self-pay | Admitting: *Deleted

## 2015-04-05 ENCOUNTER — Telehealth (HOSPITAL_COMMUNITY): Payer: Self-pay | Admitting: *Deleted

## 2015-04-05 ENCOUNTER — Ambulatory Visit (INDEPENDENT_AMBULATORY_CARE_PROVIDER_SITE_OTHER): Payer: Medicaid Other | Admitting: Obstetrics & Gynecology

## 2015-04-05 VITALS — BP 99/59 | HR 92 | Wt 232.0 lb

## 2015-04-05 DIAGNOSIS — O480 Post-term pregnancy: Secondary | ICD-10-CM | POA: Diagnosis not present

## 2015-04-05 DIAGNOSIS — Z3483 Encounter for supervision of other normal pregnancy, third trimester: Secondary | ICD-10-CM

## 2015-04-05 NOTE — Telephone Encounter (Signed)
Preadmission screen  

## 2015-04-05 NOTE — Patient Instructions (Signed)
Return to clinic for any obstetric concerns or go to MAU for evaluation  

## 2015-04-05 NOTE — Progress Notes (Signed)
Subjective:  Robyn Ortiz is a 29 y.o. G3P2002 at [redacted]w[redacted]d being seen today for ongoing prenatal care.  Patient reports occasional contractions.  Contractions: Irregular.  Vag. Bleeding: None. Movement: Present. Denies leaking of fluid.   The following portions of the patient's history were reviewed and updated as appropriate: allergies, current medications, past family history, past medical history, past social history, past surgical history and problem list.   Objective:   Filed Vitals:   04/05/15 0953  BP: 99/59  Pulse: 92  Weight: 232 lb (105.235 kg)    Fetal Status: Fetal Heart Rate (bpm): NST Fundal Height: 39 cm Movement: Present  Presentation: Vertex  General:  Alert, oriented and cooperative. Patient is in no acute distress.  Skin: Skin is warm and dry. No rash noted.   Cardiovascular: Normal heart rate noted  Respiratory: Normal respiratory effort, no problems with respiration noted  Abdomen: Soft, gravid, appropriate for gestational age. Pain/Pressure: Present     Pelvic: Vag. Bleeding: None Vag D/C Character: Thin   Cervical exam performed Dilation: 2 Effacement (%): 50 Station: -3  Extremities: Normal range of motion.  Edema: Trace  Mental Status: Normal mood and affect. Normal behavior. Normal judgment and thought content.   Urinalysis: Urine Protein: Negative Urine Glucose: Negative  NST performed today was reviewed and was found to be reactive.  AFI normal at 19.2 cm.  Continue recommended antenatal testing and prenatal care.  Assessment and Plan:  Pregnancy: G3P2002 at [redacted]w[redacted]d  Post-term pregnancy, 40-42 weeks of gestation Normal NST and AFI today.  Schedule IOL at 41 weeks -> scheduled 04/13/15 at 0730  Term labor symptoms and general obstetric precautions including but not limited to vaginal bleeding, contractions, leaking of fluid and fetal movement were reviewed in detail with the patient. Please refer to After Visit Summary for other counseling recommendations.   Return in about 4 days (around 04/09/2015) for NST only.   Tereso Newcomer, MD

## 2015-04-08 ENCOUNTER — Inpatient Hospital Stay (HOSPITAL_COMMUNITY)
Admission: AD | Admit: 2015-04-08 | Discharge: 2015-04-10 | DRG: 765 | Disposition: A | Discharge status: Home / Self Care | Payer: Medicaid Other | Source: Ambulatory Visit | Attending: Family Medicine | Admitting: Family Medicine

## 2015-04-08 ENCOUNTER — Inpatient Hospital Stay (HOSPITAL_COMMUNITY): Payer: Medicaid Other | Admitting: Anesthesiology

## 2015-04-08 ENCOUNTER — Encounter (HOSPITAL_COMMUNITY)
Admission: AD | Disposition: A | Discharge status: Home / Self Care | Payer: Self-pay | Source: Ambulatory Visit | Attending: Family Medicine

## 2015-04-08 ENCOUNTER — Encounter (HOSPITAL_COMMUNITY): Payer: Self-pay | Admitting: *Deleted

## 2015-04-08 DIAGNOSIS — O99214 Obesity complicating childbirth: Secondary | ICD-10-CM | POA: Diagnosis present

## 2015-04-08 DIAGNOSIS — O9952 Diseases of the respiratory system complicating childbirth: Secondary | ICD-10-CM | POA: Diagnosis present

## 2015-04-08 DIAGNOSIS — O4292 Full-term premature rupture of membranes, unspecified as to length of time between rupture and onset of labor: Secondary | ICD-10-CM | POA: Diagnosis present

## 2015-04-08 DIAGNOSIS — F17210 Nicotine dependence, cigarettes, uncomplicated: Secondary | ICD-10-CM | POA: Diagnosis present

## 2015-04-08 DIAGNOSIS — Z3A40 40 weeks gestation of pregnancy: Secondary | ICD-10-CM | POA: Diagnosis present

## 2015-04-08 DIAGNOSIS — IMO0001 Reserved for inherently not codable concepts without codable children: Secondary | ICD-10-CM

## 2015-04-08 DIAGNOSIS — O99334 Smoking (tobacco) complicating childbirth: Secondary | ICD-10-CM | POA: Diagnosis present

## 2015-04-08 DIAGNOSIS — Z98891 History of uterine scar from previous surgery: Secondary | ICD-10-CM

## 2015-04-08 DIAGNOSIS — Z8659 Personal history of other mental and behavioral disorders: Secondary | ICD-10-CM

## 2015-04-08 DIAGNOSIS — O9962 Diseases of the digestive system complicating childbirth: Secondary | ICD-10-CM | POA: Diagnosis present

## 2015-04-08 DIAGNOSIS — Z302 Encounter for sterilization: Secondary | ICD-10-CM

## 2015-04-08 DIAGNOSIS — O99210 Obesity complicating pregnancy, unspecified trimester: Secondary | ICD-10-CM

## 2015-04-08 DIAGNOSIS — O99331 Smoking (tobacco) complicating pregnancy, first trimester: Secondary | ICD-10-CM

## 2015-04-08 DIAGNOSIS — Z6841 Body Mass Index (BMI) 40.0 and over, adult: Secondary | ICD-10-CM | POA: Diagnosis not present

## 2015-04-08 DIAGNOSIS — O480 Post-term pregnancy: Secondary | ICD-10-CM

## 2015-04-08 DIAGNOSIS — O99891 Other specified diseases and conditions complicating pregnancy: Secondary | ICD-10-CM

## 2015-04-08 DIAGNOSIS — O9989 Other specified diseases and conditions complicating pregnancy, childbirth and the puerperium: Secondary | ICD-10-CM

## 2015-04-08 LAB — TYPE AND SCREEN
ABO/RH(D): AB POS
ANTIBODY SCREEN: NEGATIVE

## 2015-04-08 LAB — CBC
HEMATOCRIT: 35.3 % — AB (ref 36.0–46.0)
HEMOGLOBIN: 11.9 g/dL — AB (ref 12.0–15.0)
MCH: 31.6 pg (ref 26.0–34.0)
MCHC: 33.7 g/dL (ref 30.0–36.0)
MCV: 93.6 fL (ref 78.0–100.0)
Platelets: 196 10*3/uL (ref 150–400)
RBC: 3.77 MIL/uL — ABNORMAL LOW (ref 3.87–5.11)
RDW: 13.7 % (ref 11.5–15.5)
WBC: 8.6 10*3/uL (ref 4.0–10.5)

## 2015-04-08 LAB — POCT FERN TEST: POCT FERN TEST: POSITIVE

## 2015-04-08 LAB — RAPID HIV SCREEN (HIV 1/2 AB+AG)
HIV 1/2 Antibodies: NONREACTIVE
HIV-1 P24 Antigen - HIV24: NONREACTIVE

## 2015-04-08 LAB — RPR: RPR Ser Ql: NONREACTIVE

## 2015-04-08 SURGERY — Surgical Case
Anesthesia: Epidural

## 2015-04-08 MED ORDER — PHENYLEPHRINE 8 MG IN D5W 100 ML (0.08MG/ML) PREMIX OPTIME
INJECTION | INTRAVENOUS | Status: DC | PRN
  Administered 2015-04-08: 60 ug/min via INTRAVENOUS
  Administered 2015-04-08: 80 ug/min via INTRAVENOUS

## 2015-04-08 MED ORDER — EPHEDRINE 5 MG/ML INJ
INTRAVENOUS | Status: AC
Start: 2015-04-08 — End: 2015-04-08
  Filled 2015-04-08: qty 10

## 2015-04-08 MED ORDER — LIDOCAINE-EPINEPHRINE (PF) 2 %-1:200000 IJ SOLN
INTRAMUSCULAR | Status: AC
  Filled 2015-04-08: qty 20

## 2015-04-08 MED ORDER — LANOLIN HYDROUS EX OINT: 1.0000 "application " | TOPICAL_OINTMENT | CUTANEOUS | Status: DC | PRN

## 2015-04-08 MED ORDER — ONDANSETRON HCL 4 MG/2ML IJ SOLN
INTRAMUSCULAR | Status: AC
  Filled 2015-04-08: qty 2

## 2015-04-08 MED ORDER — DIPHENHYDRAMINE HCL 50 MG/ML IJ SOLN: 12.5000 mg | INTRAMUSCULAR | Status: DC | PRN

## 2015-04-08 MED ORDER — IBUPROFEN 600 MG PO TABS: 600.0000 mg | ORAL_TABLET | Freq: Four times a day (QID) | ORAL | Status: DC

## 2015-04-08 MED ORDER — NALOXONE HCL 1 MG/ML IJ SOLN: 1.0000 ug/kg/h | INTRAMUSCULAR | Status: DC | PRN

## 2015-04-08 MED ORDER — ONDANSETRON HCL 4 MG/2ML IJ SOLN: 4.0000 mg | Freq: Four times a day (QID) | INTRAMUSCULAR | Status: DC | PRN

## 2015-04-08 MED ORDER — MEPERIDINE HCL 25 MG/ML IJ SOLN
INTRAMUSCULAR | Status: AC
Start: 2015-04-08 — End: 2015-04-08
  Filled 2015-04-08: qty 1

## 2015-04-08 MED ORDER — OXYTOCIN 10 UNIT/ML IJ SOLN
40.0000 [IU] | INTRAVENOUS | Status: DC | PRN
  Administered 2015-04-08: 40 [IU] via INTRAVENOUS

## 2015-04-08 MED ORDER — MEPERIDINE HCL 25 MG/ML IJ SOLN
INTRAMUSCULAR | Status: AC
  Filled 2015-04-08: qty 1

## 2015-04-08 MED ORDER — SCOPOLAMINE 1 MG/3DAYS TD PT72: 1.0000 | MEDICATED_PATCH | Freq: Once | TRANSDERMAL | Status: DC

## 2015-04-08 MED ORDER — LIDOCAINE HCL (PF) 1 % IJ SOLN: 30.0000 mL | INTRAMUSCULAR | Status: DC | PRN

## 2015-04-08 MED ORDER — SODIUM BICARBONATE 8.4 % IV SOLN
INTRAVENOUS | Status: AC
  Filled 2015-04-08: qty 50

## 2015-04-08 MED ORDER — PHENYLEPHRINE 40 MCG/ML (10ML) SYRINGE FOR IV PUSH (FOR BLOOD PRESSURE SUPPORT)
80.0000 ug | PREFILLED_SYRINGE | INTRAVENOUS | Status: DC | PRN
  Filled 2015-04-08: qty 20

## 2015-04-08 MED ORDER — FENTANYL 2.5 MCG/ML BUPIVACAINE 1/10 % EPIDURAL INFUSION (WH - ANES)
14.0000 mL/h | INTRAMUSCULAR | Status: DC | PRN
  Filled 2015-04-08: qty 125

## 2015-04-08 MED ORDER — LACTATED RINGERS IV SOLN: 500.0000 mL | INTRAVENOUS | Status: DC | PRN

## 2015-04-08 MED ORDER — LACTATED RINGERS IV SOLN: INTRAVENOUS | Status: DC

## 2015-04-08 MED ORDER — PHENYLEPHRINE 8 MG IN D5W 100 ML (0.08MG/ML) PREMIX OPTIME
INJECTION | INTRAVENOUS | Status: AC
  Filled 2015-04-08: qty 100

## 2015-04-08 MED ORDER — WITCH HAZEL-GLYCERIN EX PADS: 1.0000 "application " | MEDICATED_PAD | CUTANEOUS | Status: DC | PRN

## 2015-04-08 MED ORDER — OXYTOCIN BOLUS FROM INFUSION: 500.0000 mL | INTRAVENOUS | Status: DC

## 2015-04-08 MED ORDER — CEFAZOLIN SODIUM-DEXTROSE 2-3 GM-% IV SOLR
INTRAVENOUS | Status: DC | PRN
  Administered 2015-04-08: 2 g via INTRAVENOUS

## 2015-04-08 MED ORDER — TERBUTALINE SULFATE 1 MG/ML IJ SOLN: 0.2500 mg | Freq: Once | INTRAMUSCULAR | Status: DC | PRN

## 2015-04-08 MED ORDER — ONDANSETRON HCL 4 MG/2ML IJ SOLN
4.0000 mg | Freq: Three times a day (TID) | INTRAMUSCULAR | Status: DC | PRN
  Administered 2015-04-08: 4 mg via INTRAVENOUS
  Filled 2015-04-08: qty 2

## 2015-04-08 MED ORDER — PRENATAL MULTIVITAMIN CH
1.0000 | ORAL_TABLET | Freq: Every day | ORAL | Status: DC
Start: 2015-04-08 — End: 2015-04-10
  Administered 2015-04-09: 1 via ORAL
  Filled 2015-04-08: qty 1

## 2015-04-08 MED ORDER — NALBUPHINE HCL 10 MG/ML IJ SOLN: 5.0000 mg | INTRAMUSCULAR | Status: DC | PRN

## 2015-04-08 MED ORDER — ERYTHROMYCIN 5 MG/GM OP OINT
TOPICAL_OINTMENT | OPHTHALMIC | Status: AC
  Filled 2015-04-08: qty 1

## 2015-04-08 MED ORDER — LIDOCAINE HCL (PF) 1 % IJ SOLN
INTRAMUSCULAR | Status: DC | PRN
  Administered 2015-04-08 (×2): 5 mL

## 2015-04-08 MED ORDER — MEPERIDINE HCL 25 MG/ML IJ SOLN
6.2500 mg | INTRAMUSCULAR | Status: DC | PRN
  Administered 2015-04-08: 6.25 mg via INTRAVENOUS

## 2015-04-08 MED ORDER — MEPERIDINE HCL 25 MG/ML IJ SOLN
INTRAMUSCULAR | Status: DC | PRN
  Administered 2015-04-08: 12.5 mg via INTRAVENOUS

## 2015-04-08 MED ORDER — FENTANYL CITRATE (PF) 100 MCG/2ML IJ SOLN
100.0000 ug | INTRAMUSCULAR | Status: DC | PRN
  Administered 2015-04-08: 100 ug via INTRAVENOUS
  Filled 2015-04-08: qty 2

## 2015-04-08 MED ORDER — SODIUM CHLORIDE 0.9 % IR SOLN
Status: DC | PRN
  Administered 2015-04-08: 1000 mL

## 2015-04-08 MED ORDER — ACETAMINOPHEN 325 MG PO TABS: 650.0000 mg | ORAL_TABLET | ORAL | Status: DC | PRN

## 2015-04-08 MED ORDER — SIMETHICONE 80 MG PO CHEW
80.0000 mg | CHEWABLE_TABLET | Freq: Three times a day (TID) | ORAL | Status: DC
  Administered 2015-04-08 – 2015-04-09 (×4): 80 mg via ORAL
  Filled 2015-04-08 (×4): qty 1

## 2015-04-08 MED ORDER — NALOXONE HCL 0.4 MG/ML IJ SOLN: 0.4000 mg | INTRAMUSCULAR | Status: DC | PRN

## 2015-04-08 MED ORDER — FENTANYL 2.5 MCG/ML BUPIVACAINE 1/10 % EPIDURAL INFUSION (WH - ANES)
INTRAMUSCULAR | Status: DC | PRN
  Administered 2015-04-08: 14 mL/h via EPIDURAL

## 2015-04-08 MED ORDER — LACTATED RINGERS IV SOLN
INTRAVENOUS | Status: DC
  Administered 2015-04-08: 03:00:00 via INTRAVENOUS

## 2015-04-08 MED ORDER — IBUPROFEN 600 MG PO TABS
600.0000 mg | ORAL_TABLET | Freq: Four times a day (QID) | ORAL | Status: DC
  Administered 2015-04-08 – 2015-04-10 (×7): 600 mg via ORAL
  Filled 2015-04-08 (×7): qty 1

## 2015-04-08 MED ORDER — FENTANYL CITRATE (PF) 100 MCG/2ML IJ SOLN
INTRAMUSCULAR | Status: DC | PRN
  Administered 2015-04-08 (×2): 50 ug via INTRAVENOUS

## 2015-04-08 MED ORDER — MENTHOL 3 MG MT LOZG: 1.0000 | LOZENGE | OROMUCOSAL | Status: DC | PRN

## 2015-04-08 MED ORDER — OXYCODONE-ACETAMINOPHEN 5-325 MG PO TABS
2.0000 | ORAL_TABLET | ORAL | Status: DC | PRN
  Administered 2015-04-10: 2 via ORAL
  Filled 2015-04-08: qty 2

## 2015-04-08 MED ORDER — NALBUPHINE HCL 10 MG/ML IJ SOLN
INTRAMUSCULAR | Status: AC
  Filled 2015-04-08: qty 1

## 2015-04-08 MED ORDER — PHENYLEPHRINE 40 MCG/ML (10ML) SYRINGE FOR IV PUSH (FOR BLOOD PRESSURE SUPPORT)
PREFILLED_SYRINGE | INTRAVENOUS | Status: AC
Start: 2015-04-08 — End: 2015-04-08
  Filled 2015-04-08: qty 10

## 2015-04-08 MED ORDER — LACTATED RINGERS IV SOLN
INTRAVENOUS | Status: DC
  Administered 2015-04-08 (×4): via INTRAUTERINE

## 2015-04-08 MED ORDER — MORPHINE SULFATE (PF) 0.5 MG/ML IJ SOLN
INTRAMUSCULAR | Status: DC | PRN
  Administered 2015-04-08: 4 mg via EPIDURAL

## 2015-04-08 MED ORDER — SIMETHICONE 80 MG PO CHEW: 80.0000 mg | CHEWABLE_TABLET | ORAL | Status: DC | PRN

## 2015-04-08 MED ORDER — EPHEDRINE 5 MG/ML INJ
10.0000 mg | INTRAVENOUS | Status: DC | PRN
  Administered 2015-04-08: 10 mg via INTRAVENOUS

## 2015-04-08 MED ORDER — DIPHENHYDRAMINE HCL 25 MG PO CAPS: 25.0000 mg | ORAL_CAPSULE | ORAL | Status: DC | PRN

## 2015-04-08 MED ORDER — ONDANSETRON HCL 4 MG/2ML IJ SOLN
INTRAMUSCULAR | Status: DC | PRN
Start: 2015-04-08 — End: 2015-04-08
  Administered 2015-04-08: 4 mg via INTRAVENOUS

## 2015-04-08 MED ORDER — MORPHINE SULFATE 0.5 MG/ML IJ SOLN
INTRAMUSCULAR | Status: AC
  Filled 2015-04-08: qty 100

## 2015-04-08 MED ORDER — OXYTOCIN 10 UNIT/ML IJ SOLN
INTRAMUSCULAR | Status: AC
  Filled 2015-04-08: qty 4

## 2015-04-08 MED ORDER — SODIUM BICARBONATE 8.4 % IV SOLN
INTRAVENOUS | Status: DC | PRN
  Administered 2015-04-08: 10 mL via EPIDURAL

## 2015-04-08 MED ORDER — DIPHENHYDRAMINE HCL 25 MG PO CAPS
25.0000 mg | ORAL_CAPSULE | Freq: Four times a day (QID) | ORAL | Status: DC | PRN
  Administered 2015-04-08: 25 mg via ORAL
  Filled 2015-04-08: qty 1

## 2015-04-08 MED ORDER — SENNOSIDES-DOCUSATE SODIUM 8.6-50 MG PO TABS
2.0000 | ORAL_TABLET | ORAL | Status: DC
  Administered 2015-04-08 – 2015-04-10 (×2): 2 via ORAL
  Filled 2015-04-08 (×2): qty 2

## 2015-04-08 MED ORDER — SODIUM CHLORIDE 0.9 % IJ SOLN: 3.0000 mL | INTRAMUSCULAR | Status: DC | PRN

## 2015-04-08 MED ORDER — OXYTOCIN 40 UNITS IN LACTATED RINGERS INFUSION - SIMPLE MED
1.0000 m[IU]/min | INTRAVENOUS | Status: DC
  Administered 2015-04-08: 2 m[IU]/min via INTRAVENOUS
  Administered 2015-04-08: 4 m[IU]/min via INTRAVENOUS

## 2015-04-08 MED ORDER — FENTANYL CITRATE (PF) 100 MCG/2ML IJ SOLN
INTRAMUSCULAR | Status: AC
Start: 2015-04-08 — End: 2015-04-08
  Filled 2015-04-08: qty 4

## 2015-04-08 MED ORDER — FENTANYL CITRATE (PF) 100 MCG/2ML IJ SOLN: 25.0000 ug | INTRAMUSCULAR | Status: DC | PRN

## 2015-04-08 MED ORDER — CITRIC ACID-SODIUM CITRATE 334-500 MG/5ML PO SOLN
30.0000 mL | ORAL | Status: DC | PRN
  Administered 2015-04-08: 30 mL via ORAL
  Filled 2015-04-08: qty 15

## 2015-04-08 MED ORDER — NICOTINE 14 MG/24HR TD PT24
14.0000 mg | MEDICATED_PATCH | Freq: Every day | TRANSDERMAL | Status: DC
  Administered 2015-04-08 – 2015-04-09 (×2): 14 mg via TRANSDERMAL
  Filled 2015-04-08 (×3): qty 1

## 2015-04-08 MED ORDER — OXYCODONE-ACETAMINOPHEN 5-325 MG PO TABS
1.0000 | ORAL_TABLET | ORAL | Status: DC | PRN
  Administered 2015-04-09 (×4): 1 via ORAL
  Filled 2015-04-08 (×4): qty 1

## 2015-04-08 MED ORDER — FENTANYL 2.5 MCG/ML BUPIVACAINE 1/10 % EPIDURAL INFUSION (WH - ANES): 14.0000 mL/h | INTRAMUSCULAR | Status: DC | PRN

## 2015-04-08 MED ORDER — DIBUCAINE 1 % RE OINT: 1.0000 "application " | TOPICAL_OINTMENT | RECTAL | Status: DC | PRN

## 2015-04-08 MED ORDER — SIMETHICONE 80 MG PO CHEW
80.0000 mg | CHEWABLE_TABLET | ORAL | Status: DC
  Administered 2015-04-08 – 2015-04-10 (×2): 80 mg via ORAL
  Filled 2015-04-08 (×2): qty 1

## 2015-04-08 MED ORDER — NALBUPHINE HCL 10 MG/ML IJ SOLN: 5.0000 mg | Freq: Once | INTRAMUSCULAR | Status: DC | PRN

## 2015-04-08 MED ORDER — TETANUS-DIPHTH-ACELL PERTUSSIS 5-2.5-18.5 LF-MCG/0.5 IM SUSP: 0.5000 mL | Freq: Once | INTRAMUSCULAR | Status: DC

## 2015-04-08 MED ORDER — DEXAMETHASONE SODIUM PHOSPHATE 10 MG/ML IJ SOLN
INTRAMUSCULAR | Status: DC | PRN
  Administered 2015-04-08: 5 mg via INTRAVENOUS

## 2015-04-08 MED ORDER — ZOLPIDEM TARTRATE 5 MG PO TABS: 5.0000 mg | ORAL_TABLET | Freq: Every evening | ORAL | Status: DC | PRN

## 2015-04-08 MED ORDER — DEXAMETHASONE SODIUM PHOSPHATE 10 MG/ML IJ SOLN
INTRAMUSCULAR | Status: AC
Start: 2015-04-08 — End: 2015-04-08
  Filled 2015-04-08: qty 1

## 2015-04-08 MED ORDER — LACTATED RINGERS IV SOLN: 125.0000 mL/h | INTRAVENOUS | Status: DC

## 2015-04-08 MED ORDER — OXYTOCIN 40 UNITS IN LACTATED RINGERS INFUSION - SIMPLE MED
62.5000 mL/h | INTRAVENOUS | Status: AC
Start: 2015-04-08 — End: 2015-04-09

## 2015-04-08 MED ORDER — NALBUPHINE HCL 10 MG/ML IJ SOLN
5.0000 mg | INTRAMUSCULAR | Status: DC | PRN
  Administered 2015-04-08: 5 mg via INTRAVENOUS
  Filled 2015-04-08: qty 1

## 2015-04-08 MED ORDER — OXYTOCIN 40 UNITS IN LACTATED RINGERS INFUSION - SIMPLE MED
62.5000 mL/h | INTRAVENOUS | Status: DC
  Filled 2015-04-08: qty 1000

## 2015-04-08 SURGICAL SUPPLY — 38 items
APL SKNCLS STERI-STRIP NONHPOA (GAUZE/BANDAGES/DRESSINGS) ×1
BENZOIN TINCTURE PRP APPL 2/3 (GAUZE/BANDAGES/DRESSINGS) ×2 IMPLANT
BRR ADH 6X5 SEPRAFILM 1 SHT (MISCELLANEOUS)
CLAMP CORD UMBIL (MISCELLANEOUS) IMPLANT
CLIP FILSHIE TUBAL LIGA STRL (Clip) ×2 IMPLANT
CLOSURE WOUND 1/2 X4 (GAUZE/BANDAGES/DRESSINGS) ×1
CONTAINER PREFILL 10% NBF 15ML (MISCELLANEOUS) IMPLANT
DRAPE SHEET LG 3/4 BI-LAMINATE (DRAPES) IMPLANT
DRSG OPSITE POSTOP 4X10 (GAUZE/BANDAGES/DRESSINGS) ×3 IMPLANT
DURAPREP 26ML APPLICATOR (WOUND CARE) ×3 IMPLANT
ELECT REM PT RETURN 9FT ADLT (ELECTROSURGICAL) ×3
ELECTRODE REM PT RTRN 9FT ADLT (ELECTROSURGICAL) ×1 IMPLANT
EXTRACTOR VACUUM M CUP 4 TUBE (SUCTIONS) IMPLANT
EXTRACTOR VACUUM M CUP 4' TUBE (SUCTIONS)
GLOVE BIOGEL PI IND STRL 6.5 (GLOVE) ×1 IMPLANT
GLOVE BIOGEL PI INDICATOR 6.5 (GLOVE) ×2
GLOVE SURG SS PI 6.0 STRL IVOR (GLOVE) ×3 IMPLANT
GOWN STRL REUS W/TWL LRG LVL3 (GOWN DISPOSABLE) ×6 IMPLANT
KIT ABG SYR 3ML LUER SLIP (SYRINGE) IMPLANT
NDL HYPO 25X5/8 SAFETYGLIDE (NEEDLE) IMPLANT
NEEDLE HYPO 25X5/8 SAFETYGLIDE (NEEDLE) IMPLANT
NS IRRIG 1000ML POUR BTL (IV SOLUTION) ×3 IMPLANT
PACK C SECTION WH (CUSTOM PROCEDURE TRAY) ×3 IMPLANT
PAD ABD 8X7 1/2 STERILE (GAUZE/BANDAGES/DRESSINGS) ×2 IMPLANT
PAD OB MATERNITY 4.3X12.25 (PERSONAL CARE ITEMS) ×3 IMPLANT
RTRCTR C-SECT PINK 25CM LRG (MISCELLANEOUS) IMPLANT
SEPRAFILM MEMBRANE 5X6 (MISCELLANEOUS) IMPLANT
SPONGE GAUZE 4X4 12PLY STER LF (GAUZE/BANDAGES/DRESSINGS) ×2 IMPLANT
STRIP CLOSURE SKIN 1/2X4 (GAUZE/BANDAGES/DRESSINGS) ×1 IMPLANT
SUT MNCRL AB 4-0 PS2 18 (SUTURE) ×2 IMPLANT
SUT MON AB 2-0 CT1 36 (SUTURE) ×2 IMPLANT
SUT PLAIN 0 NONE (SUTURE) IMPLANT
SUT PLAIN 2 0 XLH (SUTURE) ×2 IMPLANT
SUT VIC AB 0 CT1 36 (SUTURE) ×12 IMPLANT
SUT VIC AB 4-0 KS 27 (SUTURE) ×5 IMPLANT
TAPE CLOTH SURG 4X10 WHT LF (GAUZE/BANDAGES/DRESSINGS) ×2 IMPLANT
TOWEL OR 17X24 6PK STRL BLUE (TOWEL DISPOSABLE) ×3 IMPLANT
TRAY FOLEY CATH SILVER 14FR (SET/KITS/TRAYS/PACK) ×3 IMPLANT

## 2015-04-08 NOTE — H&P (Signed)
LABOR ADMISSION HISTORY AND PHYSICAL  Robyn Ortiz is a 29 y.o. female G3P2002 with IUP at [redacted]w[redacted]d by 9wk [place] presenting for SROM at 12:20am with subsequent contractions. She reports +FM, + contractions, no VB.  She plans on breast and bottle feeding. She request BTL for birth control.  Dating: By Gaetana Michaelis [place] --->  Estimated Date of Delivery: 04/05/15   Prenatal History/Complications: -tobacco use Clinic [facility] Prenatal Labs  Dating 9 week scan Blood type: AB/POS/-- (01/19 1427)   Genetic Screen 1 Screen: Neg AFP: Neg  Antibody:NEG (01/19 1427)  Anatomic US WNL Rubella: 1.62 (01/19 1427)  GTT Third trimester: 150 3 hour- normal RPR: NON REAC (01/19 1427)   Flu vaccine 06/2014 HBsAg: NEGATIVE (01/19 1427)   TDaP vaccine  01/18/2015  HIV: NONREACTIVE (01/19 1427)   GBS  negative  GBS:   Contraception BTL, sign papers 01/18/2015 Pap: normal (09/04/2014)  Baby Food Bottle   Circumcision If female, yes but outside hospital   Pediatrician [facility]   Support Person FOB       Past Medical History: Past Medical History  Diagnosis Date  . History of bronchitis 33yrs ago  . Migraine   . History of bleeding ulcers     told not to take ibuprofen  . Obesity   . GERD (gastroesophageal reflux disease)     Past Surgical History: Past Surgical History  Procedure Laterality Date  . Tonsillectomy  03/10/2012    Procedure: TONSILLECTOMY;  Surgeon: Suzanna Obey, MD;  Location: [facility] OR;  Service: ENT;  Laterality: Bilateral;  . Cholecystectomy  03-27-14    Dr Lemar Livings    Obstetrical History: OB History    Gravida Para Term Preterm AB TAB SAB Ectopic Multiple Living   3 2 2       2       Obstetric Comments   1st Menstrual Cycle:  14  1st Pregnancy:  47      Social History: Social History   Social History  . Marital Status: Single    Spouse Name: N/A  . Number of Children: N/A  . Years of Education: N/A    Social History Main Topics  . Smoking status: Current Every Day Smoker -- 1.00 packs/day for 13 years    Types: Cigarettes  . Smokeless tobacco: Never Used  . Alcohol Use: Yes     Comment: beer occasionally  . Drug Use: No  . Sexual Activity: Yes    Birth Control/ Protection: None   Other Topics Concern  . None   Social History Narrative    Family History: Family History  Problem Relation Age of Onset  . Diabetes Father   . Heart disease Paternal Grandmother   . Cancer Paternal Grandfather     intestional cancer  . Other Paternal Grandfather     deteriorating disc  . Bipolar disorder Mother     Allergies: Allergies  Allergen Reactions  . Codeine Other (See Comments)    Pt. States she had a seizure  . Ibuprofen     Patient was diagnosed with bleeding ulcers and advised not to take this medication.    Prescriptions prior to admission  Medication Sig Dispense Refill Last Dose  . acetaminophen (TYLENOL) 500 MG tablet Take 1,000 mg by mouth every 6 (six) hours as needed for headache.    Taking  . cyclobenzaprine (FLEXERIL) 10 MG tablet Take 1 tablet (10 mg total) by mouth every 8 (eight) hours as needed for muscle spasms. 30 tablet 1 Taking  .  Doxylamine-Pyridoxine (DICLEGIS) 10-10 MG TBEC Take 2 tablets QHS, if symptoms persist add one tablet QAM on day 3, if symptoms persist add one tablet every afternoon starting day 4. 100 tablet 3 Taking  . Prenatal Vit-Min-FA-Fish Oil ([employer] PRENATAL GUMMY PO) Take 2 tablets by mouth daily.   Taking  . triamcinolone (KENALOG) 0.025 % ointment Apply 1 application topically 2 (two) times daily. 30 g 3 Taking    Review of Systems  All systems reviewed and negative except as stated in HPI  BP 112/70 mmHg  Pulse 70  Temp(Src) 97.9 F (36.6 C) (Oral)  Resp 15  Ht  (1.626 m)  Wt 235 lb 12.8 oz (106.958 kg)  BMI 40.45 kg/m2  SpO2 100%  LMP 06/20/2014 (Approximate) General appearance: alert, cooperative and no  distress Lungs: normal work of breathing Heart: regular rate  Abdomen: gravid, soft, non-tender Pelvic: adequate Extremities: Homans sign is negative, no sign of DVT, edema Presentation: cephalic Fetal monitoringBaseline: 130 bpm, Variability: Good {> 6 bpm), Accelerations: Reactive and Decelerations: Absent Uterine activityFrequency: Every 3-5 minutes Dilation: 3 Effacement (%): 60 Station: -3 Exam by:: Elie Confer RN   Prenatal labs: ABO, Rh: AB/POS/-- (01/19 1427) Antibody: NEG (01/19 1427) Rubella:  Immune RPR: NON REAC (06/03 1005)  HBsAg: NEGATIVE (01/19 1427)  HIV: NONREACTIVE (06/03 1005)  GBS: Negative (07/22 0000)  1 hr Glucola 150, 3-hr normal Genetic screening normal Anatomy US normal  Prenatal Transfer Tool  Maternal Diabetes: No Genetic Screening: Normal Maternal Ultrasounds/Referrals: Normal Fetal Ultrasounds or other Referrals:  None Maternal Substance Abuse:  Yes:  Type: Smoker Significant Maternal Medications:  None Significant Maternal Lab Results: Lab values include: Group B Strep negative  Results for orders placed or performed during the hospital encounter of 04/08/15 (from the past 24 hour(s))  Fern Test   Collection Time: 04/08/15  1:47 AM  Result Value Ref Range   POCT Fern Test Positive = ruptured amniotic membanes     Patient Active Problem List   Diagnosis Date Noted  . Post term pregnancy, antepartum 09/04/2014  . Eczema 09/04/2014  . Tobacco smoking affecting pregnancy in first trimester, antepartum 09/04/2014  . H/O postpartum depression, currently pregnant 06/27/2013  . Obesity (BMI 30-39.9) 11/21/2012  . Asthma 11/21/2012  . Current smoker 11/21/2012  . Condyloma acuminatum of perianal region 11/21/2012  . Migraine 11/21/2012    Assessment: Robyn Ortiz is a 29 y.o. G3P2002 at [redacted]w[redacted]d here with SROM and early active labor.   #Labor: Expectant management via NSVD. #Pain: Plan for epidural #FWB:  Category 1 #ID: GBS  neg #MOF: Breast/Bottle #MOC: PP BTL (consent in chart) #Circ: Female, outpatient circ  Caryl Ada, DO 04/08/2015, 2:29 AM PGY-2, [facility]

## 2015-04-08 NOTE — Progress Notes (Signed)
Patient ID: Robyn Ortiz, female   DOB: 08-21-1985, 29 y.o.   MRN: [id number]  Arrived to room for fetal bradycardia, unresponsive to position changes. Called stat c-section. The risks of cesarean section discussed with the patient included but were not limited to: bleeding which may require transfusion or reoperation; infection which may require antibiotics; injury to bowel, bladder, ureters or other surrounding organs; injury to the fetus; need for additional procedures including hysterectomy in the event of a life-threatening hemorrhage; placental abnormalities wth subsequent pregnancies, incisional problems, thromboembolic phenomenon and other postoperative/anesthesia complications. The patient concurred with the proposed plan, giving informed written consent for the procedure.  Anesthesia and OR aware.  Preoperative prophylactic Ancef ordered on call to the OR.  To OR when ready.  Levie Heritage, DO 04/08/2015 10:03 AM

## 2015-04-08 NOTE — Op Note (Signed)
PATIENT:  Robyn Ortiz  29 y.o. female  PRE-OPERATIVE DIAGNOSIS:  primary cesarean section for fetal bradycardia  POST-OPERATIVE DIAGNOSIS:  primary cesarean section for fetal bradycardia  PROCEDURE:  Procedure(s): CESAREAN SECTION (N/A)  SURGEON:  Surgeon(s) and Role:    * Levie Heritage, DO    * Federico Flake, MD - Fellow  ANESTHESIA:   epidural  EBL:  Total I/O In: 2862.5 [I.V.:2862.5] Out: 2750 [Urine:1650; Emesis/NG output:100; Blood:1000]  INDICATIONS: Robyn Ortiz is a 29 y.o. G3P3003 at [redacted]w[redacted]d was admitted for SROM and necessitated augmentation. She was laboring and had fetal bradycardia to the 70s that did not respond to O2, position changes, amnioinfusion. Given persistent bradycardia and concern for fetal well being we proceeded to STAT CS.  She confirmed here desire for here for cesarean section and bilateral tubal sterilization secondary to the indications listed under preoperative diagnoses; please see preoperative note for further details.  The risks of surgery were discussed with the patient including but were not limited to: bleeding which may require transfusion or reoperation; infection which may require antibiotics; injury to bowel, bladder, ureters or other surrounding organs; injury to the fetus; need for additional procedures including hysterectomy in the event of a life-threatening hemorrhage; placental abnormalities wth subsequent pregnancies, incisional problems, thromboembolic phenomenon and other postoperative/anesthesia complications.  Patient also desires permanent sterilization.    FINDINGS:  Viable female infant in cephalic presentation.  Apgars 8 and 9.  Clear amniotic fluid.  Intact placenta, three vessel cord.  Normal uterus, fallopian tubes and ovaries bilaterally. Fallopian tubes sterilized with Filshie clips bilaterally.  PROCEDURE IN DETAIL:  The patient preoperatively received intravenous antibiotics and had sequential compression devices  applied to her lower extremities.   She was then taken to the operating room where the epidural anesthesia was dosed up to surgical level and was found to be adequate. She was then placed in a dorsal supine position with a leftward tilt, and prepped and draped in a sterile manner.   After an adequate timeout was performed, a Pfannenstiel skin incision was made with scalpel and carried through to the underlying layer of fascia. The fascia was incised in the midline, and this incision was extended bilaterally using the Mayo scissors.  Kocher clamps were applied to the superior aspect of the fascial incision and the underlying rectus muscles were dissected off bluntly. A similar process was carried out on the inferior aspect of the fascial incision. The rectus muscles were separated in the midline bluntly and the peritoneum was entered bluntly. Attention was turned to the lower uterine segment where a low transverse hysterotomy was made with a scalpel and extended bilaterally bluntly.  The infant was successfully delivered, the cord was clamped and cut and the infant was handed over to awaiting neonatology team. It was noted that infant had a tight nuchal cord and a body cord. Uterine massage was then administered, and the placenta delivered intact with a three-vessel cord. The uterus was then cleared of clot and debris.  The hysterotomy was closed with 0 Vicryl in a running locked fashion, and a horizontal imbricating layer was also placed with 0 Vicryl. Attention was paid to the right edge of the hysterotomy as there was a branch of the uterine artery that caused bleeding. This was incorporated into the repair of the hysterotomy.  Attention was then turned to the fallopian tubes, and Filshie clips were placed about 3 cm from the cornua, with care given to incorporate the underlying mesosalpinx  on both sides, allowing for bilateral tubal sterilization. The pelvis was cleared of all clot and debris and irrigated.  Hemostasis was confirmed on all surfaces.  The peritoneum and the muscles were reapproximated using 0 Vicryl running stitches. The fascia was then closed using 0 Vicryl in a running fashion.  The subcutaneous layer was irrigated, then reapproximated with 2-0 plain gut interrupted stitches.  The skin was closed with a 4-0 Keith subcuticular stitch. The patient tolerated the procedure well. Sponge, lap, instrument and needle counts were correct x 2.  She was taken to the recovery room in stable condition.   Federico Flake, MD Family Medicine, OB Fellow [facility]

## 2015-04-08 NOTE — Anesthesia Procedure Notes (Signed)
Epidural Patient location during procedure: OB Start time: 04/08/2015 6:14 AM End time: 04/08/2015 6:30 AM  Staffing Anesthesiologist: Sebastian Ache  Preanesthetic Checklist Completed: patient identified, site marked, surgical consent, pre-op evaluation, timeout performed, IV checked, risks and benefits discussed and monitors and equipment checked  Epidural Patient position: sitting Prep: site prepped and draped and DuraPrep Patient monitoring: heart rate, continuous pulse ox and blood pressure Approach: midline Location: L3-L4 Injection technique: LOR air  Needle:  Needle type: Tuohy  Needle gauge: 17 G Needle length: 9 cm and 9 Needle insertion depth: 6.5 cm Catheter type: closed end flexible Catheter size: 19 Gauge Catheter at skin depth: 14 cm Test dose: negative  Assessment Events: blood not aspirated, injection not painful, no injection resistance, negative IV test and no paresthesia  Additional Notes   Patient tolerated the insertion well without complications.Reason for block:procedure for pain

## 2015-04-08 NOTE — Progress Notes (Signed)
Labor Progress Note  S: Called to room for fetal heart rate deceleration. Patient feeling well. Had SROM and is being augmented with pitocin  O:  BP 106/55 mmHg  Pulse 57  Temp(Src) 97.6 F (36.4 C) (Oral)  Resp 18  Ht  (1.626 m)  Wt 235 lb (106.595 kg)  BMI 40.32 kg/m2  SpO2 99%  LMP 06/20/2014 (Approximate) IFM: 120/mod to marked/+accels, variable decelerations possible late decelerations with contractions. Returns to baseline and infant responds to scalp stimulation.  FHR nadir was high 70s  WUJ:WJXBJYNW: 7.5 Effacement (%): 80 Cervical Position: Middle Station: 0 Presentation: Vertex Exam by::  (Dr. Alvester Morin)   A&P: 29 y.o. G9F6213 [redacted]w[redacted]d here with SROM and augmented labor with fetal heart decelerations.  #Labor: augmented. SROM :00 #FWB: Given decelerations, stopped pitocin. Provided scalp stimulation. Maternal repositioning performed. Replaced FSE and placed IUPC to start amnioinfusion. Likely cause is rapid descent and dilation. Continue amnioinfusion bolus with infusion.  #GBS- neg  Federico Flake, MD 9:24 AM

## 2015-04-08 NOTE — Anesthesia Postprocedure Evaluation (Signed)
Anesthesia Post Note  Patient: Robyn Ortiz  Procedure(s) Performed: Procedure(s) (LRB): CESAREAN SECTION (N/A)  Anesthesia type: Epidural  Patient location: Mother/Baby  Post pain: Pain level controlled  Post assessment: Post-op Vital signs reviewed  Last Vitals:  Filed Vitals:   04/08/15 1450  BP: 112/52  Pulse: 57  Temp: 36.3 C  Resp: 20    Post vital signs: Reviewed  Level of consciousness:alert  Complications: No apparent anesthesia complications

## 2015-04-08 NOTE — Lactation Note (Signed)
This note was copied from the chart of [place]. Lactation Consultation Note; Experienced BF mom has not put baby to the breast yet. Has given formula. Offered assist with latch but mom refused stating she does not want to put the baby to the breast, just wants to pump and bottle feed. DEBP and kit in room. Set up for mom with instructions for use and cleaning. Mom does not want to pump now- several family members present. BF brochure given with resources for support after DC. No questions at present. To call prn  Patient Name: Robyn Ortiz NOMVE'H Date: 04/08/2015 Reason for consult: Initial assessment   Maternal Data Formula Feeding for Exclusion: Yes Reason for exclusion: Mother's choice to formula and breast feed on admission Does the patient have breastfeeding experience prior to this delivery?: Yes  Feeding Feeding Type: Bottle Fed - Formula Nipple Type: Slow - flow  LATCH Score/Interventions                      Lactation Tools Discussed/Used WIC Program: Yes Pump Review: Setup, frequency, and cleaning Initiated by:: DW Date initiated:: 04/08/15   Consult Status      Truddie Crumble 04/08/2015, 2:15 PM

## 2015-04-08 NOTE — Progress Notes (Signed)
Labor Progress Note  Robyn Ortiz is a 29 y.o. G3P2002 at [redacted]w[redacted]d  admitted for rupture of membranes  S: Patient just finished receiving epidural. States that she could not handle the discomfort anymore.   O:  BP 118/75 mmHg  Pulse 75  Temp(Src) 97.9 F (36.6 C) (Oral)  Resp 16  Ht  (1.626 m)  Wt 235 lb (106.595 kg)  BMI 40.32 kg/m2  SpO2 99%  LMP 06/20/2014 (Approximate)  FHT:  FHR: 120 bpm, variability: moderate,  accelerations:  Present,  decelerations:  Absent UC:   regular, every 2-5 minutes SVE:   Dilation: 3 Effacement (%): 50 Station: -3 Exam by:: Lanice Shirts RN  SROM  Pitocin @ 2 mu/min  Labs: Lab Results  Component Value Date   WBC 8.6 04/08/2015   HGB 11.9* 04/08/2015   HCT 35.3* 04/08/2015   MCV 93.6 04/08/2015   PLT 196 04/08/2015    Assessment / Plan: 29 y.o. G3P2002 [redacted]w[redacted]d in early active labor s/p SROM. Augmentation of labor, progressing well  Labor: Progressing on Pitocin, will increase as patient gets more comfortable with epidural.  Fetal Wellbeing:  Category I Pain Control:  Epidural Anticipated MOD:  NSVD  Expectant management   Caryl Ada, DO 04/08/2015, 6:51 AM PGY-2, [facility]

## 2015-04-08 NOTE — MAU Note (Signed)
Pt noticed some LOF about 0000-clear. Doesn't know if her water has broken. Is not currently wearing a pad but states that she has changed underwear 4-5 times in last hour. Having some irregular contractions and some pressure. Denies vag bleeding. +FM. GBS-

## 2015-04-08 NOTE — Anesthesia Preprocedure Evaluation (Addendum)
Anesthesia Evaluation  Patient identified by MRN, date of birth, ID band Patient awake and Patient confused    Reviewed: Allergy & Precautions, H&P , NPO status , Patient's Chart, lab work & pertinent test results  Airway Mallampati: II       Dental   Pulmonary asthma , Current Smoker,  breath sounds clear to auscultation  Pulmonary exam normal       Cardiovascular Exercise Tolerance: Good Normal cardiovascular examRhythm:regular Rate:Normal     Neuro/Psych    GI/Hepatic   Endo/Other  Morbid obesity  Renal/GU      Musculoskeletal   Abdominal   Peds  Hematology   Anesthesia Other Findings   Reproductive/Obstetrics (+) Pregnancy                            Anesthesia Physical Anesthesia Plan  ASA: II and emergent  Anesthesia Plan: Epidural   Post-op Pain Management:    Induction:   Airway Management Planned: Natural Airway  Additional Equipment:   Intra-op Plan:   Post-operative Plan:   Informed Consent: I have reviewed the patients History and Physical, chart, labs and discussed the procedure including the risks, benefits and alternatives for the proposed anesthesia with the patient or authorized representative who has indicated his/her understanding and acceptance.     Plan Discussed with: CRNA, Anesthesiologist and Surgeon  Anesthesia Plan Comments: (Patient for STAT C/Section for fetal bradycardia. Will use epidural for C/Section. M. Skyylar Kopf,MD)       Anesthesia Quick Evaluation

## 2015-04-08 NOTE — Transfer of Care (Signed)
Immediate Anesthesia Transfer of Care Note  Patient: Robyn Ortiz  Procedure(s) Performed: Procedure(s): CESAREAN SECTION (N/A)  Patient Location: PACU  Anesthesia Type:Epidural  Level of Consciousness: awake, alert  and oriented  Airway & Oxygen Therapy: Patient Spontanous Breathing  Post-op Assessment: Report given to RN and Post -op Vital signs reviewed and stable  Post vital signs: Reviewed and stable  Last Vitals:  Filed Vitals:   04/08/15 0945  BP: 102/63  Pulse: 62  Temp:   Resp: 18    Complications: No apparent anesthesia complications

## 2015-04-09 ENCOUNTER — Other Ambulatory Visit: Payer: Medicaid Other

## 2015-04-09 ENCOUNTER — Encounter (HOSPITAL_COMMUNITY): Payer: Self-pay | Admitting: Obstetrics and Gynecology

## 2015-04-09 LAB — CBC
HEMATOCRIT: 29 % — AB (ref 36.0–46.0)
Hemoglobin: 9.6 g/dL — ABNORMAL LOW (ref 12.0–15.0)
MCH: 31.1 pg (ref 26.0–34.0)
MCHC: 33.1 g/dL (ref 30.0–36.0)
MCV: 93.9 fL (ref 78.0–100.0)
PLATELETS: 167 10*3/uL (ref 150–400)
RBC: 3.09 MIL/uL — AB (ref 3.87–5.11)
RDW: 13.9 % (ref 11.5–15.5)
WBC: 11.9 10*3/uL — AB (ref 4.0–10.5)

## 2015-04-09 NOTE — Clinical Social Work Maternal (Signed)
CLINICAL SOCIAL WORK MATERNAL/CHILD NOTE  Patient Details  Name: VICY MEDICO MRN: [id number] Date of Birth: 11-11-1985  Date:  04/09/2015  Clinical Social Worker Initiating Note:  Loleta Books, LCSW Date/ Time Initiated:  04/09/15/1500     Child's Name:  Brunilda Payor   Legal Guardian:  Cathalina and Mateo Flow  Need for Interpreter:  None   Date of Referral:  04/08/15     Reason for Referral:  History of postpartum depression  Referral Source:  [facility]   Address:  [address] [place], [place] [address]  Phone number:  [phone]   Household Members:  Minor Children, Spouse   Natural Supports (not living in the home):  Immediate Family, Extended Family   Professional Supports: None   Employment: did not assess  Type of Work:   N/A  Education:    N/A  Architect:  Medicaid   Other Resources:  Sales executive , [employer]   Cultural/Religious Considerations Which May Impact Care:  None reported  Strengths:  Ability to meet basic needs , Merchandiser, retail , Home prepared for child    Risk Factors/Current Problems:  None   Cognitive State:  Able to Concentrate , Alert , Goal Oriented , Linear Thinking    Mood/Affect:  Bright , Happy , Interested , Calm    CSW Assessment:  CSW received request for consult due to MOB presenting with a history of postpartum depression.  MOB and FOB presented as easily engaged and receptive to the visit.  FOB was attending to and caring for the infant during the visit, but also participated in the assessment. MOB maintained consistent eye contact, was in a pleasant mood, and displayed a full range in affect. No acute mental health symptoms observed or noted in her thought process.  MOB shared that she is eager and ready to be discharged home. She stated that she feels "happy" now that the infant has been born, but also expressed feeling pulled home by household responsibilities since she and the  FOB have 5 other children at home (ages 23, 21,11, 73, and 18 months).  MOB denied concerns related to transitioning to parenting 6 children since the oldest children return to school next week and the 12 month old will be in day care.  MOB discussed that she is more concerned about her transition to postpartum with this infant since she had a C-section (with no prior C-sections).  She shared that she continues to experience physical pain, and is adjusting to the change in recovery.  When asked to reflect upon her feelings s/p C-section, she reported that she was scared since it was an emergency C-section and had no time to prepare.  1 day postpartum, she stated that she feels "better" since it was for the best interest of the infant's health.  MOB did not identify the emergency C-section as traumatic, and is feeling as if she is coping well with the procedure.   CSW inquired about postpartum depression that is listed in her chart. MOB shared that she is unsure if she experienced the [facility] or Postpartum depression.  She stated that she is unsure what the difference is between the [facility] and PPD.  MOB reviewed symptoms that she experienced, and symptoms highlight that she likely experienced the [facility] since the symptoms occurred for only 1-2 weeks postpartum. She stated that she was crying for no reason, and felt overwhelmed with limited sleep and engorgement. MOB reported that  she required no additional intervention and never took the medication that her MD prescribed for her since the symptoms resolved.  CSW reviewed in detail differences between the [facility] and perinatal mood and anxiety disorders. MOB expressed appreciation for the information, and agreed to contact her medical provider with any notable symptoms of concern. MOB aware of ongoing support through the Feelings After Birth class on [facility].  She expressed appreciation for the visit, and agreed to contact CSW if additional needs arise.    CSW Plan/Description:   1)Patient/Family Education: Perinatal mood and anxiety disorders, Feelings After Birth support group 2)No Further Intervention Required/No Barriers to Discharge    Kelby Fam 04/09/2015, 3:57 PM

## 2015-04-09 NOTE — Progress Notes (Signed)
UR chart review completed.  

## 2015-04-09 NOTE — Progress Notes (Signed)
Subjective: Postpartum Day 1: Cesarean Delivery Patient reports nausea and incisional pain.   Had nausea during the day  But it is better now  Objective: Vital signs in last 24 hours: Temp:  [97.3 F (36.3 C)-98.5 F (36.9 C)] 98.5 F (36.9 C) (08/23 0400) Pulse Rate:  [57-98] 64 (08/23 0400) Resp:  [17-21] 20 (08/23 0400) BP: (86-114)/(44-73) 109/66 mmHg (08/23 0400) SpO2:  [96 %-100 %] 98 % (08/23 0606)  Physical Exam:  General: alert, cooperative and no distress Lochia: appropriate Uterine Fundus: firm Incision: healing well, Dressing mostly saturated with blood  DVT Evaluation: No evidence of DVT seen on physical exam.   Recent Labs  04/08/15 0235 04/09/15 0605  HGB 11.9* 9.6*  HCT 35.3* 29.0*    Assessment/Plan: Status post Cesarean section. Doing well postoperatively.  Continue current care.  [facility] 04/09/2015, 7:16 AM

## 2015-04-10 MED ORDER — OXYCODONE-ACETAMINOPHEN 5-325 MG PO TABS: 1.0000 | ORAL_TABLET | ORAL | Status: DC | PRN

## 2015-04-10 MED ORDER — DOCUSATE SODIUM 100 MG PO CAPS: 100.0000 mg | ORAL_CAPSULE | Freq: Two times a day (BID) | ORAL | Status: DC | PRN

## 2015-04-10 NOTE — Discharge Summary (Signed)
Obstetric Discharge Summary Reason for Admission: rupture of membranes Prenatal Procedures: ultrasound Intrapartum Procedures: cesarean: low cervical, transverse and tubal ligation Postpartum Procedures: none Complications-Operative and Postpartum: none  Hospital Course: Active Problems:   H/O postpartum depression, currently pregnant   Active labor   Maternal obesity, antepartum   S/P C-section   Robyn Ortiz is a 29 y.o. [id number] s/p pLTCS/BTL.  Patient was admitted for SROM and SOL.  She has postpartum course that was uncomplicated including no problems with ambulating, PO intake, urination, pain, or bleeding. The pt feels ready to go home and  will be discharged with outpatient follow-up.   Today: No acute events overnight.  Pt denies problems with ambulating, voiding or po intake.  She denies nausea or vomiting.  Pain is well controlled.  She has had flatus. She has had bowel movement.  Lochia Small.  Plan for birth control is bilateral tubal ligation.  Method of Feeding: Breast and Bottle  Physical Exam:  General: alert, cooperative and no distress Lochia: appropriate Uterine Fundus: firm Incision: healing well, no significant drainage DVT Evaluation: No evidence of DVT seen on physical exam.  H/H: Lab Results  Component Value Date/Time   HGB 9.6* 04/09/2015 06:05 AM   HCT 29.0* 04/09/2015 06:05 AM    Discharge Diagnoses: Term Pregnancy-delivered  Discharge Information: Date: 04/10/2015 Activity: pelvic rest Diet: routine  Medications: PNV, Colace, Percocet and tylenol Breast feeding:  Yes and Bottle Condition: stable Instructions: refer to handout Discharge to: home      Medication List    STOP taking these medications        cyclobenzaprine 10 MG tablet  Commonly known as:  FLEXERIL     Doxylamine-Pyridoxine 10-10 MG Tbec  Commonly known as:  DICLEGIS     triamcinolone 0.025 % ointment  Commonly known as:  KENALOG      TAKE these medications         acetaminophen 500 MG tablet  Commonly known as:  TYLENOL  Take 1,000 mg by mouth every 6 (six) hours as needed for headache.     [employer] PRENATAL GUMMY PO  Take 2 tablets by mouth daily.     docusate sodium 100 MG capsule  Commonly known as:  COLACE  Take 1 capsule (100 mg total) by mouth 2 (two) times daily as needed for mild constipation.     oxyCODONE-acetaminophen 5-325 MG per tablet  Commonly known as:  PERCOCET/ROXICET  Take 1 tablet by mouth every 4 (four) hours as needed (for pain scale 4-7).       Follow-up Information    Schedule an appointment as soon as possible for a visit with [facility].   Specialty:  Obstetrics and Gynecology   Why:  for post-partum follow-up   Contact information:   [address] [place] [address] [phone]     Caryl Ada, DO 04/10/2015, 7:26 AM PGY-2, [facility]  OB fellow attestation Post Partum Day 2 I have seen and examined this patient and agree with above documentation in the resident's note.   Robyn Ortiz is a 29 y.o. G3P3003 s/p pLTCS.  Pt denies problems with ambulating, voiding or po intake. Pain is well controlled.  Plan for birth control is bilateral tubal ligation.  Method of Feeding: Breast with formula supplementation  PE:  BP 120/58 mmHg  Pulse 72  Temp(Src) 97.9 F (36.6 C) (Axillary)  Resp 18  Ht 5\' 4"  (1.626 m)  Wt 235 lb (106.595 kg)  BMI 40.32 kg/m2  SpO2 99%  LMP 06/20/2014 (Approximate)  Breastfeeding? Unknown Fundus firm Incision is clean/dry/intact without erythema. Appropriately tender around incision.   Plan for discharge: Today  Federico Flake, MD 8:05 AM

## 2015-04-10 NOTE — Discharge Instructions (Signed)

## 2015-04-13 ENCOUNTER — Inpatient Hospital Stay (HOSPITAL_COMMUNITY): Admission: RE | Admit: 2015-04-13 | Payer: Medicaid Other | Source: Ambulatory Visit

## 2015-04-23 ENCOUNTER — Ambulatory Visit (INDEPENDENT_AMBULATORY_CARE_PROVIDER_SITE_OTHER): Payer: Medicaid Other | Admitting: Obstetrics & Gynecology

## 2015-04-23 ENCOUNTER — Encounter: Payer: Self-pay | Admitting: Obstetrics & Gynecology

## 2015-04-23 VITALS — BP 102/67 | HR 71 | Ht 65.0 in | Wt 211.0 lb

## 2015-04-23 DIAGNOSIS — Z23 Encounter for immunization: Secondary | ICD-10-CM | POA: Diagnosis not present

## 2015-04-23 DIAGNOSIS — F53 Postpartum depression: Secondary | ICD-10-CM | POA: Insufficient documentation

## 2015-04-23 DIAGNOSIS — O99345 Other mental disorders complicating the puerperium: Principal | ICD-10-CM

## 2015-04-23 LAB — TSH: TSH: 1.503 u[IU]/mL (ref 0.350–4.500)

## 2015-04-23 MED ORDER — CITALOPRAM HYDROBROMIDE 20 MG PO TABS: 20.0000 mg | ORAL_TABLET | Freq: Every day | ORAL | Status: DC

## 2015-04-23 NOTE — Progress Notes (Signed)
Pt c/o anxiety and feelings of postpartum depression since delivery.  Had "postpartum blues" with previous child and was medicated.

## 2015-04-23 NOTE — Progress Notes (Signed)
   Subjective:    Patient ID: Robyn Ortiz, female    DOB: 1985-10-08, 29 y.o.   MRN: [id number]  HPI 29 yo MW P3 (but cares for 6 kids) here 2 weeks after a PLTCS and BTL. She is here with the return of her PP depression symptoms, same as after last pregnancy. Husband is helpful as well as her mother and sisters "I'm never alone". She denies HI/SI. She declines a social work/psych consult. She would like to start medication as she did after her last pregnancy.  Review of Systems She is stopping breast feeding as she finds it adds to her stress.    Objective:   Physical Exam  [id number] She is conversing normally and has a normal (but tired) affect.      Assessment & Plan:  PP depression- treat with celexa SI/HI precautions RTC 2 weeks/prn Check TSH Flu vaccine today

## 2015-05-03 ENCOUNTER — Telehealth: Payer: Self-pay | Admitting: Obstetrics

## 2015-05-03 NOTE — Telephone Encounter (Signed)
05/03/2015 - Unable to leave patient a vm due to phone log being full - per Dr. Clearance Coots son's circumcision cannot be done at this time. Circumcision first scheduled on 04/17/2015 and could not be completed. Also advised patient on 04/24/2015 the procedure could not be done per Dr. Clearance Coots.brm

## 2015-05-03 NOTE — Telephone Encounter (Signed)
COMPLETED

## 2015-05-10 ENCOUNTER — Ambulatory Visit (INDEPENDENT_AMBULATORY_CARE_PROVIDER_SITE_OTHER): Payer: Medicaid Other | Admitting: Obstetrics & Gynecology

## 2015-05-10 ENCOUNTER — Encounter: Payer: Self-pay | Admitting: Obstetrics & Gynecology

## 2015-05-10 DIAGNOSIS — F53 Postpartum depression: Secondary | ICD-10-CM

## 2015-05-10 DIAGNOSIS — O99345 Other mental disorders complicating the puerperium: Secondary | ICD-10-CM

## 2015-05-10 NOTE — Progress Notes (Signed)
  Subjective:     Robyn Ortiz is a 29 y.o. G62P3003 female who presents for a postpartum visit. She is 4 weeks postpartum following a cesarean delivery and BTS for fetal bradycardia at [redacted]w[redacted]d. Postpartum course has been complicated by postpartum depression; had a depression scale score of 18 on 04/23/15 and was started on Celexa. She declined SW or Psych referral.  Today she reports feeling better, her score has improved to 4.  Still declines any referral.  Denies HI/SI, is satisfied with the Celexa.   Baby's course has been uncomplicated. Baby is feeding by formula. Bleeding moderate lochia. Bowel function is normal. Bladder function is normal. Patient is sexually active. Contraception method is tubal ligation.   The following portions of the patient's history were reviewed and updated as appropriate: allergies, current medications, past family history, past medical history, past social history, past surgical history and problem list.  Normal pap in 09/04/2014.  Review of Systems Pertinent items are noted in HPI.   Objective:    BP 109/74 mmHg  Pulse 76  Wt 214 lb (97.07 kg)  Breastfeeding? No  General:  alert and no distress   Breasts:  deferred  Lungs: clear to auscultation bilaterally  Heart:  regular rate and rhythm  Abdomen: soft, non-tender; bowel sounds normal; no masses,  no organomegaly and incision is healing well C/D/I   Pelvic:  not evaluated        Assessment:   Normal postpartum exam. Pap smear not done at today's visit.   Postpartum depression  Plan:   1. Contraception: tubal ligation 2. Continue Celexa for now, depression precautions reviewed 3. Follow up as needed.    Jaynie Collins, MD, FACOG Attending Obstetrician & Gynecologist, [facility]

## 2015-05-17 ENCOUNTER — Encounter: Payer: Self-pay | Admitting: *Deleted

## 2015-05-31 ENCOUNTER — Ambulatory Visit: Payer: Medicaid Other | Admitting: Obstetrics & Gynecology

## 2016-03-23 ENCOUNTER — Telehealth: Payer: Self-pay | Admitting: *Deleted

## 2016-03-23 NOTE — Telephone Encounter (Signed)
Pt had a BTL last August when she delivered her last child, states last night she felt a sensation that felt similar to fetal movement and that her husband was able to feel it as well with palpation.  Pt took a UPT and it was negative.  Informed pt that if she was pregnant to the point that she was able to feel fetal movement that the pregnancy test would be positive and more than likely she was feeling gas bubbles and peristalsis in the colon.  Pt acknowledged and instructed to call back if she has any other changes.

## 2017-04-14 ENCOUNTER — Other Ambulatory Visit: Payer: Self-pay

## 2017-04-14 ENCOUNTER — Other Ambulatory Visit
Admission: RE | Admit: 2017-04-14 | Discharge: 2017-04-14 | Disposition: A | Discharge status: Home / Self Care | Payer: Medicaid Other | Source: Ambulatory Visit | Attending: Gastroenterology | Admitting: Gastroenterology

## 2017-04-14 ENCOUNTER — Encounter: Payer: Self-pay | Admitting: Gastroenterology

## 2017-04-14 ENCOUNTER — Encounter (INDEPENDENT_AMBULATORY_CARE_PROVIDER_SITE_OTHER): Payer: Self-pay

## 2017-04-14 ENCOUNTER — Ambulatory Visit (INDEPENDENT_AMBULATORY_CARE_PROVIDER_SITE_OTHER): Payer: Medicaid Other | Admitting: Gastroenterology

## 2017-04-14 VITALS — BP 109/73 | HR 76 | Temp 98.0°F | Ht 65.0 in | Wt 232.0 lb

## 2017-04-14 DIAGNOSIS — R1013 Epigastric pain: Secondary | ICD-10-CM

## 2017-04-14 DIAGNOSIS — R197 Diarrhea, unspecified: Secondary | ICD-10-CM | POA: Diagnosis present

## 2017-04-14 DIAGNOSIS — D649 Anemia, unspecified: Secondary | ICD-10-CM

## 2017-04-14 LAB — CBC
HCT: 40.2 % (ref 35.0–47.0)
Hemoglobin: 13.9 g/dL (ref 12.0–16.0)
MCH: 31.5 pg (ref 26.0–34.0)
MCHC: 34.5 g/dL (ref 32.0–36.0)
MCV: 91.4 fL (ref 80.0–100.0)
Platelets: 246 10*3/uL (ref 150–440)
RBC: 4.4 MIL/uL (ref 3.80–5.20)
RDW: 13.1 % (ref 11.5–14.5)
WBC: 6.8 10*3/uL (ref 3.6–11.0)

## 2017-04-14 LAB — BASIC METABOLIC PANEL
Anion gap: 8 (ref 5–15)
BUN: 12 mg/dL (ref 6–20)
CO2: 23 mmol/L (ref 22–32)
Calcium: 9.1 mg/dL (ref 8.9–10.3)
Chloride: 107 mmol/L (ref 101–111)
Creatinine, Ser: 0.46 mg/dL (ref 0.44–1.00)
GFR calc Af Amer: >60 mL/min (ref >60)
GFR calc non Af Amer: >60 mL/min (ref >60)
Glucose, Bld: 106 mg/dL — ABNORMAL HIGH (ref 65–99)
Potassium: 3.9 mmol/L (ref 3.5–5.1)
Sodium: 138 mmol/L (ref 135–145)

## 2017-04-14 LAB — HEPATIC FUNCTION PANEL
ALT: 37 U/L (ref 14–54)
AST: 26 U/L (ref 15–41)
Albumin: 4.5 g/dL (ref 3.5–5.0)
Alkaline Phosphatase: 49 U/L (ref 38–126)
Bilirubin, Direct: <0.1 mg/dL — ABNORMAL LOW (ref 0.1–0.5)
Total Bilirubin: 0.5 mg/dL (ref 0.3–1.2)
Total Protein: 7.2 g/dL (ref 6.5–8.1)

## 2017-04-14 LAB — FERRITIN: Ferritin: 30 ng/mL (ref 11–307)

## 2017-04-14 LAB — IRON AND TIBC
IRON: 49 ug/dL (ref 28–170)
Saturation Ratios: 13 % (ref 10.4–31.8)
TIBC: 366 ug/dL (ref 250–450)
UIBC: 317 ug/dL

## 2017-04-14 MED ORDER — CHOLESTYRAMINE 4 G PO PACK: 4.0000 g | PACK | Freq: Two times a day (BID) | ORAL | 0 refills | Status: DC

## 2017-04-14 NOTE — Patient Instructions (Addendum)
1. Continue protonix 40mg  30min before breakfast 2.Try cholestyramine 2gm packet two times daily 3. Blood tests 4. EGD with biopsies  Please call our office to speak with my nurse Iva LentoMichelle Ortega at [phone] during business hours from 8am to 4pm if you have any questions/concerns. During after hours, you will be redirected to on call GI physician. For any emergency please call 911 or go the nearest emergency room.    Arlyss Repressohini R Misao Fackrell, MD [address]  [address]  [place], [place] [address]  Main: [phone]  Fax: [phone]

## 2017-04-14 NOTE — Progress Notes (Signed)
Arlyss Repressohini R Saint Hank, MD [address]  [address]  [place], [place] [address]  Main: [phone]  Fax: [phone]    Gastroenterology Consultation  Referring Provider:     Armando GangLindley, Cheryl P, FNP Primary Care Physician:  Evelene CroonNiemeyer, Meindert, MD Primary Gastroenterologist:  Dr. Arlyss Repressohini R Ruby Logiudice Reason for Consultation:     Dyspepsia and diarrhea        HPI:   Robyn Ortiz is a 31 y.o. y/o female referred by Dr. Evelene CroonNiemeyer, Meindert, MD  for consultation & management of Epigastric pain and right upper quadrant pain, sharp knot-like sensation worse after eating associated with nausea. She has been having these symptoms for at least 40 years but got worse after her last child was born 2 years ago. She has been taking ibuprofen 600 mg twice daily both for abdominal pain and headache. She has been gaining weight, denies loss of appetite, melena, rectal bleeding, trouble swallowing. She is also suffering from nonbloody diarrhea, after each meal since she had a cholecystectomy. She denies hematochezia, blood mixed with stool, lower abdominal pain, urgency, constipation. She had gallbladder removal for her upper abdominal symptoms, however her symptoms continued. She smokes daily but does not drink alcohol. She takes Protonix 40 mg daily.   She takes care of 6 kids, 3 are adopted  She denies family history of GI malignancy GI Procedures: None  Past Medical History:  Diagnosis Date  . GERD (gastroesophageal reflux disease)   . History of bleeding ulcers    told not to take ibuprofen  . History of bronchitis 540yrs ago  . Migraine   . Obesity     Past Surgical History:  Procedure Laterality Date  . CESAREAN SECTION N/A 04/08/2015   Procedure: CESAREAN SECTION;  Surgeon: Catalina AntiguaPeggy Constant, MD;  Location: [facility];  Service: Obstetrics;  Laterality: N/A;  . CHOLECYSTECTOMY  03-27-14   Dr Lemar LivingsByrnett  . TONSILLECTOMY  03/10/2012   Procedure: TONSILLECTOMY;  Surgeon: Suzanna ObeyJohn Byers, MD;  [facility];   Service: ENT;  Laterality: Bilateral;    Prior to Admission medications   Medication Sig Start Date End Date Taking? Authorizing Provider  cetirizine (ZYRTEC) 10 MG tablet Take 10 mg by mouth daily. 04/04/17  Yes [provider]  folic acid (FOLVITE) 1 MG tablet Take 1 mg by mouth daily. 04/04/17  Yes [provider]  pantoprazole (PROTONIX) 40 MG tablet Take 40 mg by mouth daily. 04/04/17  Yes [provider]    Family History  Problem Relation Age of Onset  . Diabetes Father   . Heart disease Paternal Grandmother   . Cancer Paternal Grandfather        intestional cancer  . Other Paternal Grandfather        deteriorating disc  . Bipolar disorder Mother      Social History  Substance Use Topics  . Smoking status: Current Every Day Smoker    Packs/day: 1.00    Years: 13.00    Types: Cigarettes  . Smokeless tobacco: Never Used  . Alcohol use Yes     Comment: beer occasionally    Allergies as of 04/14/2017 - Review Complete 04/14/2017  Allergen Reaction Noted  . Codeine Other (See Comments) 01/26/2012  . Ibuprofen  11/09/2012    Review of Systems:    All systems reviewed and negative except where noted in HPI.   Physical Exam:  BP 109/73   Pulse 76   Temp 98 F (36.7 C) (Oral)   Ht 5'  5" (1.651 m)   Wt 232 lb (105.2 kg)   BMI 38.61 kg/m  No LMP recorded.  General:   Alert,  Well-developed, well-nourished, pleasant and cooperative in NAD Head:  Normocephalic and atraumatic. Eyes:  Sclera clear, no icterus.   Conjunctiva pink. Ears:  Normal auditory acuity. Nose:  No deformity, discharge, or lesions. Mouth:  No deformity or lesions,oropharynx pink & moist. Neck:  Supple; no masses or thyromegaly. Lungs:  Respirations even and unlabored.  Clear throughout to auscultation.   No wheezes, crackles, or rhonchi. No acute distress. Heart:  Regular rate and rhythm; no murmurs, clicks, rubs, or gallops. Abdomen:  Normal bowel sounds.  No bruits.   Soft, non-tender and non-distended without masses, hepatosplenomegaly or hernias noted.  No guarding or rebound tenderness.   Rectal: Nor performed Msk:  Symmetrical without gross deformities. Good, equal movement & strength bilaterally. Pulses:  Normal pulses noted. Extremities:  No clubbing or edema.  No cyanosis. Neurologic:  Alert and oriented x3;  grossly normal neurologically. Skin:  Intact without significant lesions or rashes. No jaundice. Lymph Nodes:  No significant cervical adenopathy. Psych:  Alert and cooperative. Normal mood and affect.  Imaging Studies: Reviewed  Assessment and Plan:   Robyn Ortiz is a 32 y.o. y/o female status postcholecystectomy with Chronic symptoms of dyspepsia and nonbloody post prandial diarrhea. Heavy NSAID use for abdominal pain and headaches. Differentials include NSAID-induced gastritis, peptic ulcer disease or enteropathy or H. pylori gastritis. Her nonbloody diarrhea could be secondary to celiac disease or bile salt diarrhea or Less likely IBD. She had normocytic anemia in 2016 after her delivery. I will repeat CBC and iron studies today  1.Continue protonix 40mg  before breakfast 2.Try cholestyramine 2gm packet two times daily 3. Labs today  4. EGD with biopsies 5. Avoid NSAIDs  Follow up in 3 months  Arlyss Repress, MD

## 2017-04-16 ENCOUNTER — Other Ambulatory Visit
Admission: RE | Admit: 2017-04-16 | Discharge: 2017-04-16 | Disposition: A | Discharge status: Home / Self Care | Payer: Medicaid Other | Source: Ambulatory Visit | Attending: Gastroenterology | Admitting: Gastroenterology

## 2017-04-16 DIAGNOSIS — R197 Diarrhea, unspecified: Secondary | ICD-10-CM | POA: Insufficient documentation

## 2017-04-16 DIAGNOSIS — R1013 Epigastric pain: Secondary | ICD-10-CM | POA: Diagnosis not present

## 2017-04-18 LAB — TISSUE TRANSGLUTAMINASE, IGG: Tissue Transglut Ab: <2 U/mL (ref 0–5)

## 2017-05-03 NOTE — Discharge Instructions (Signed)
General Anesthesia, Adult, Care After °These instructions provide you with information about caring for yourself after your procedure. Your health care provider may also give you more specific instructions. Your treatment has been planned according to current medical practices, but problems sometimes occur. Call your health care provider if you have any problems or questions after your procedure. °What can I expect after the procedure? °After the procedure, it is common to have: °· Vomiting. °· A sore throat. °· Mental slowness. ° °It is common to feel: °· Nauseous. °· Cold or shivery. °· Sleepy. °· Tired. °· Sore or achy, even in parts of your body where you did not have surgery. ° °Follow these instructions at home: °For at least 24 hours after the procedure: °· Do not: °? Participate in activities where you could fall or become injured. °? Drive. °? Use heavy machinery. °? Drink alcohol. °? Take sleeping pills or medicines that cause drowsiness. °? Make important decisions or sign legal documents. °? Take care of children on your own. °· Rest. °Eating and drinking °· If you vomit, drink water, juice, or soup when you can drink without vomiting. °· Drink enough fluid to keep your urine clear or pale yellow. °· Make sure you have little or no nausea before eating solid foods. °· Follow the diet recommended by your health care provider. °General instructions °· Have a responsible adult stay with you until you are awake and alert. °· Return to your normal activities as told by your health care provider. Ask your health care provider what activities are safe for you. °· Take over-the-counter and prescription medicines only as told by your health care provider. °· If you smoke, do not smoke without supervision. °· Keep all follow-up visits as told by your health care provider. This is important. °Contact a health care provider if: °· You continue to have nausea or vomiting at home, and medicines are not helpful. °· You  cannot drink fluids or start eating again. °· You cannot urinate after 8-12 hours. °· You develop a skin rash. °· You have fever. °· You have increasing redness at the site of your procedure. °Get help right away if: °· You have difficulty breathing. °· You have chest pain. °· You have unexpected bleeding. °· You feel that you are having a life-threatening or urgent problem. °This information is not intended to replace advice given to you by your health care provider. Make sure you discuss any questions you have with your health care provider. °Document Released: 11/09/2000 Document Revised: 01/06/2016 Document Reviewed: 07/18/2015 °Elsevier Interactive Patient Education © 2018 [employer] ° °

## 2017-05-07 ENCOUNTER — Encounter: Payer: Self-pay | Admitting: Emergency Medicine

## 2017-05-07 ENCOUNTER — Emergency Department
Admission: EM | Admit: 2017-05-07 | Discharge: 2017-05-07 | Disposition: A | Discharge status: Home / Self Care | Payer: Medicaid Other | Attending: Emergency Medicine | Admitting: Emergency Medicine

## 2017-05-07 ENCOUNTER — Emergency Department: Payer: Medicaid Other

## 2017-05-07 DIAGNOSIS — K5732 Diverticulitis of large intestine without perforation or abscess without bleeding: Secondary | ICD-10-CM

## 2017-05-07 DIAGNOSIS — R7989 Other specified abnormal findings of blood chemistry: Secondary | ICD-10-CM | POA: Diagnosis not present

## 2017-05-07 DIAGNOSIS — J45909 Unspecified asthma, uncomplicated: Secondary | ICD-10-CM | POA: Diagnosis not present

## 2017-05-07 DIAGNOSIS — F17210 Nicotine dependence, cigarettes, uncomplicated: Secondary | ICD-10-CM | POA: Insufficient documentation

## 2017-05-07 DIAGNOSIS — R945 Abnormal results of liver function studies: Secondary | ICD-10-CM

## 2017-05-07 DIAGNOSIS — R1032 Left lower quadrant pain: Secondary | ICD-10-CM | POA: Diagnosis present

## 2017-05-07 LAB — URINALYSIS, COMPLETE (UACMP) WITH MICROSCOPIC
Bilirubin Urine: NEGATIVE
Glucose, UA: NEGATIVE mg/dL
Hgb urine dipstick: NEGATIVE
KETONES UR: NEGATIVE mg/dL
Leukocytes, UA: NEGATIVE
Nitrite: NEGATIVE
PH: 6 (ref 5.0–8.0)
Protein, ur: NEGATIVE mg/dL
Specific Gravity, Urine: 1.021 (ref 1.005–1.030)

## 2017-05-07 LAB — COMPREHENSIVE METABOLIC PANEL
ALBUMIN: 4.2 g/dL (ref 3.5–5.0)
ALK PHOS: 52 U/L (ref 38–126)
ALT: 84 U/L — ABNORMAL HIGH (ref 14–54)
ANION GAP: 7 (ref 5–15)
AST: 46 U/L — ABNORMAL HIGH (ref 15–41)
BILIRUBIN TOTAL: 0.4 mg/dL (ref 0.3–1.2)
BUN: 16 mg/dL (ref 6–20)
CALCIUM: 9 mg/dL (ref 8.9–10.3)
CO2: 25 mmol/L (ref 22–32)
Chloride: 104 mmol/L (ref 101–111)
Creatinine, Ser: 0.62 mg/dL (ref 0.44–1.00)
GFR calc non Af Amer: >60 mL/min (ref >60)
GLUCOSE: 122 mg/dL — AB (ref 65–99)
POTASSIUM: 3.9 mmol/L (ref 3.5–5.1)
SODIUM: 136 mmol/L (ref 135–145)
TOTAL PROTEIN: 7.4 g/dL (ref 6.5–8.1)

## 2017-05-07 LAB — CBC
HCT: 39.8 % (ref 35.0–47.0)
Hemoglobin: 13.7 g/dL (ref 12.0–16.0)
MCH: 31.6 pg (ref 26.0–34.0)
MCHC: 34.5 g/dL (ref 32.0–36.0)
MCV: 91.6 fL (ref 80.0–100.0)
PLATELETS: 254 10*3/uL (ref 150–440)
RBC: 4.35 MIL/uL (ref 3.80–5.20)
RDW: 13 % (ref 11.5–14.5)
WBC: 8.9 10*3/uL (ref 3.6–11.0)

## 2017-05-07 LAB — POCT PREGNANCY, URINE: Preg Test, Ur: NEGATIVE

## 2017-05-07 LAB — LIPASE, BLOOD: Lipase: 31 U/L (ref 11–51)

## 2017-05-07 MED ORDER — CIPROFLOXACIN HCL 500 MG PO TABS
500.0000 mg | ORAL_TABLET | Freq: Once | ORAL | Status: AC
  Administered 2017-05-07: 500 mg via ORAL

## 2017-05-07 MED ORDER — CIPROFLOXACIN HCL 500 MG PO TABS
ORAL_TABLET | ORAL | Status: AC
  Filled 2017-05-07: qty 1

## 2017-05-07 MED ORDER — SODIUM CHLORIDE 0.9 % IV BOLUS (SEPSIS)
1000.0000 mL | Freq: Once | INTRAVENOUS | Status: AC
  Administered 2017-05-07: 1000 mL via INTRAVENOUS

## 2017-05-07 MED ORDER — CIPROFLOXACIN HCL 500 MG PO TABS: 500.0000 mg | ORAL_TABLET | Freq: Two times a day (BID) | ORAL | 0 refills | Status: AC

## 2017-05-07 MED ORDER — ONDANSETRON HCL 4 MG/2ML IJ SOLN
INTRAMUSCULAR | Status: AC
  Administered 2017-05-07: 4 mg via INTRAVENOUS
  Filled 2017-05-07: qty 2

## 2017-05-07 MED ORDER — IOPAMIDOL (ISOVUE-300) INJECTION 61%
100.0000 mL | Freq: Once | INTRAVENOUS | Status: AC | PRN
  Administered 2017-05-07: 100 mL via INTRAVENOUS
  Filled 2017-05-07: qty 100

## 2017-05-07 MED ORDER — ONDANSETRON HCL 4 MG/2ML IJ SOLN
4.0000 mg | Freq: Once | INTRAMUSCULAR | Status: AC
  Administered 2017-05-07: 4 mg via INTRAVENOUS

## 2017-05-07 MED ORDER — METRONIDAZOLE 500 MG PO TABS
500.0000 mg | ORAL_TABLET | Freq: Once | ORAL | Status: AC
  Administered 2017-05-07: 500 mg via ORAL

## 2017-05-07 MED ORDER — IOPAMIDOL (ISOVUE-300) INJECTION 61%
30.0000 mL | Freq: Once | INTRAVENOUS | Status: AC | PRN
  Administered 2017-05-07: 30 mL via ORAL
  Filled 2017-05-07: qty 30

## 2017-05-07 MED ORDER — OXYCODONE-ACETAMINOPHEN 5-325 MG PO TABS
1.0000 | ORAL_TABLET | Freq: Once | ORAL | Status: AC
  Administered 2017-05-07: 1 via ORAL

## 2017-05-07 MED ORDER — METRONIDAZOLE 500 MG PO TABS
500.0000 mg | ORAL_TABLET | Freq: Two times a day (BID) | ORAL | 0 refills | Status: AC
Start: 2017-05-07 — End: 2017-05-21

## 2017-05-07 MED ORDER — OXYCODONE-ACETAMINOPHEN 5-325 MG PO TABS
ORAL_TABLET | ORAL | Status: AC
  Filled 2017-05-07: qty 1

## 2017-05-07 MED ORDER — HYDROMORPHONE HCL 1 MG/ML IJ SOLN: 1.0000 mg | Freq: Once | INTRAMUSCULAR | Status: DC

## 2017-05-07 MED ORDER — METRONIDAZOLE 500 MG PO TABS
ORAL_TABLET | ORAL | Status: AC
  Filled 2017-05-07: qty 1

## 2017-05-07 MED ORDER — HYDROMORPHONE HCL 1 MG/ML IJ SOLN
INTRAMUSCULAR | Status: AC
  Administered 2017-05-07: 1 mg via INTRAVENOUS
  Filled 2017-05-07: qty 1

## 2017-05-07 NOTE — Discharge Instructions (Signed)
Please eat a bland diet until your pain has completely resolved.  Take the entire course of antibiotics, even if you are feeling better.    Please follow up with your regular doctor for re-evaluation, and to have your liver function tests rechecked.  Return to the emergency department for severe pain, fever, vomiting, lightheadedness or fainting, or for any other symptoms concerning to you.

## 2017-05-07 NOTE — ED Triage Notes (Signed)
Patient presents to the ED with sharp left lower quadrant pain that began today.  Patient denies vaginal bleeding, vomiting and diarrhea.  Patient reports tenderness to area.

## 2017-05-07 NOTE — ED Notes (Signed)
Pt states LLQ abd pain that began this AM. States nausea denies vomiting. Denies diarrhea. Denies fever but states hot and cold. States hx IBS, takes a powder medication for it. Alert, oriented, ambulatory. No hx of diverticulitis.

## 2017-05-07 NOTE — ED Provider Notes (Signed)
[facility] Emergency Department Provider Note  ____________________________________________  Time seen: Approximately 8:09 PM  I have reviewed the triage vital signs and the nursing notes.   HISTORY  Chief Complaint Abdominal Pain    HPI Robyn Ortiz is a 31 y.o. female with a history of GERD and obesity presenting for left lower quadrant painwith nausea. The patient reports she woke up this morning withsharp pain in the left lower quadrant, which has been intermittent throughout the day but getting progressively worse. She denies anyonstipation or diarrhea. No dysuria, urinary frequency or hematuria. No change in vaginal discharge. LMP 2 weeks ago. She has tried positional changes, without improvement in her pain.   Past Medical History:  Diagnosis Date  . GERD (gastroesophageal reflux disease)   . History of bleeding ulcers    told not to take ibuprofen  . History of bronchitis 67yrs ago  . Migraine   . Obesity   . Sleep apnea    CPAP    Patient Active Problem List   Diagnosis Date Noted  . 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 Eczema 09/04/2014  . Abnormal quad screen 03/03/2013  . Obesity (BMI 30-39.9) 11/21/2012  . Asthma 11/21/2012  . Current smoker 11/21/2012  . Condyloma acuminatum of perianal region 11/21/2012  . Migraine 11/21/2012    Past Surgical History:  Procedure Laterality Date  . CESAREAN SECTION N/A 04/08/2015   Procedure: CESAREAN SECTION;  Surgeon: Catalina Antigua, MD;  Location: [facility] ORS;  Service: Obstetrics;  Laterality: N/A;  . CESAREAN SECTION WITH BILATERAL TUBAL LIGATION  04/08/2015  . CHOLECYSTECTOMY  03-27-14   Dr Lemar Livings  . TONSILLECTOMY  03/10/2012   Procedure: TONSILLECTOMY;  Surgeon: Suzanna Obey, MD;  Location: [facility] OR;  Service: ENT;  Laterality: Bilateral;    Current Outpatient Rx  . Order #: [id number] Class: Historical Med  . Order #: [id number] Class: Historical Med  . Order #: [id number] Class: Normal  . Order  #: [id number] Class: Print  . Order #: [id number] Class: Historical Med  . Order #: [id number] Class: Print  . Order #: [id number] Class: Historical Med    Allergies Codeine and Ibuprofen  Family History  Problem Relation Age of Onset  . Diabetes Father   . Bipolar disorder Mother   . Heart disease Paternal Grandmother   . Cancer Paternal Grandfather        intestional cancer  . Other Paternal Grandfather        deteriorating disc    Social History Social History  Substance Use Topics  . Smoking status: Current Every Day Smoker    Packs/day: 1.00    Years: 13.00    Types: Cigarettes  . Smokeless tobacco: Never Used  . Alcohol use Yes     Comment: beer occasionally    Review of Systems Constitutional: No fever/chills.no lightheadedness or syncope. Eyes: No visual changes. ENT: No sore throat. No congestion or rhinorrhea. Cardiovascular: Denies chest pain. Denies palpitations. Respiratory: Denies shortness of breath.  No cough. Gastrointestinal: positive left lower quadrant abdominal pain.  + nausea, no vomiting.  No diarrhea.  No constipation. Genitourinary: Negative for dysuria, frequency.  No change in vag discharge. Musculoskeletal: Negative for back pain. Skin: Negative for rash. Neurological: Negative for headaches. No focal numbness, tingling or weakness.     ____________________________________________   PHYSICAL EXAM:  VITAL SIGNS: ED Triage Vitals [05/07/17 1750]  Enc Vitals Group     BP 129/77     Pulse Rate 84     Resp 18  Temp 98.2 F (36.8 C)     Temp Source Oral     SpO2 98 %     Weight 232 lb (105.2 kg)     Height  (1.651 m)     Head Circumference      Peak Flow      Pain Score 9     Pain Loc      Pain Edu?      Excl. in GC?     Constitutional: Alert and oriented. Uncomfortable appearing but in no acute distress. Answers questions appropriately. Eyes: Conjunctivae are normal.  EOMI. No scleral icterus. Head: Atraumatic. Nose: No  congestion/rhinnorhea. Mouth/Throat: Mucous membranes are moist.  Neck: No stridor.  Supple.  No meningismus. Cardiovascular: Normal rate, regular rhythm. No murmurs, rubs or gallops.  Respiratory: Normal respiratory effort.  No accessory muscle use or retractions. Lungs CTAB.  No wheezes, rales or ronchi. Gastrointestinal: Soft, and nondistended.  + TTP in the LLQ.  No guarding or rebound.  No peritoneal signs. Musculoskeletal: No LE edema.  Neurologic:  A&Ox3.  Speech is clear.  Face and smile are symmetric.  EOMI.  Moves all extremities well. Skin:  Skin is warm, dry and intact. No rash noted. Psychiatric: Mood and affect are normal. Speech and behavior are normal.  Normal judgement.  ____________________________________________   LABS (all labs ordered are listed, but only abnormal results are displayed)  Labs Reviewed  COMPREHENSIVE METABOLIC PANEL - Abnormal; Notable for the following:       Result Value   Glucose, Bld 122 (*)    AST 46 (*)    ALT 84 (*)    All other components within normal limits  URINALYSIS, COMPLETE (UACMP) WITH MICROSCOPIC - Abnormal; Notable for the following:    Color, Urine YELLOW (*)    APPearance CLEAR (*)    Bacteria, UA RARE (*)    Squamous Epithelial / LPF 0-5 (*)    All other components within normal limits  LIPASE, BLOOD  CBC  POC URINE PREG, ED  POCT PREGNANCY, URINE   ____________________________________________  EKG  Not indicated ____________________________________________  RADIOLOGY  Ct Abdomen Pelvis W Contrast  Result Date: 05/07/2017 CLINICAL DATA:  Sharp left lower quadrant pain beginning today. Left-sided tenderness. EXAM: CT ABDOMEN AND PELVIS WITH CONTRAST TECHNIQUE: Multidetector CT imaging of the abdomen and pelvis was performed using the standard protocol following bolus administration of intravenous contrast. CONTRAST:  ISOVUE-300 IOPAMIDOL (ISOVUE-300) INJECTION 61% COMPARISON:  None. FINDINGS: Lower chest: No  acute abnormality. Hepatobiliary: Previous cholecystectomy. Liver and biliary tree are within normal. Pancreas: Within normal. Spleen: Within normal. Adrenals/Urinary Tract: Adrenal glands are normal. Kidneys normal size without hydronephrosis or nephrolithiasis. Ureters and bladder are normal. Stomach/Bowel: Stomach and small bowel are within normal. Appendix is normal. There is diverticulosis of the colon with an inflamed diverticulum over the distal descending colon in the left lower quadrant with adjacent inflammatory change and minimal free fluid. Findings are compatible of mild acute diverticulitis. No evidence of perforation or abscess. Vascular/Lymphatic: Within normal. Reproductive: Previous bilateral tubal ligation as the uterus and ovaries otherwise within normal. Other: No abdominal wall hernia. Musculoskeletal: Unremarkable. IMPRESSION: Diverticulosis of the colon with mild acute diverticulitis involving the distal descending colon in the left lower quadrant. No perforation or abscess. Electronically Signed   By: Elberta Fortis M.D.   On: 05/07/2017 20:56    ____________________________________________   PROCEDURES  Procedure(s) performed: None  Procedures  Critical Care performed: No ____________________________________________   INITIAL  IMPRESSION / ASSESSMENT AND PLAN / ED COURSE  Pertinent labs & imaging results that were available during my care of the patient were reviewed by me and considered in my medical decision making (see chart for details).  31 y.o. F w/ obesity presenting w/ LLQ pain w/ nausea.  Overall, the patie and afebrile. I am ost concerned about diverticulitis, but other etiologies including ovarian pathology are in the differential. We will initiate a CT scan, and symptomatic treatment at this time. Plan reevaluation for final disposition.   ----------------------------------------- 9:52 PM on 05/07/2017 -----------------------------------------  The  patient's CT scan does show diverticulitis without complication. She is hemodynamically stable and afebrile. Her white blood cell count is normal. Her pain has mostly improved. At this time, she meets criteria for outpatient treatment of diverticulitis with oral antibiotics.we discussed return precautions as well as follow-up instructions. ____________________________________________  FINAL CLINICAL IMPRESSION(S) / ED DIAGNOSES  Final diagnoses:  Diverticulitis of large intestine without perforation or abscess without bleeding  Elevated LFTs         NEW MEDICATIONS STARTED DURING THIS VISIT:  New Prescriptions   CIPROFLOXACIN (CIPRO) 500 MG TABLET    Take 1 tablet (500 mg total) by mouth 2 (two) times daily.   METRONIDAZOLE (FLAGYL) 500 MG TABLET    Take 1 tablet (500 mg total) by mouth 2 (two) times daily.      Rockne Menghini, MD 05/07/17 2154

## 2017-05-12 ENCOUNTER — Encounter: Payer: Self-pay | Admitting: *Deleted

## 2017-05-19 ENCOUNTER — Ambulatory Visit: Payer: Medicaid Other | Admitting: Anesthesiology

## 2017-05-19 ENCOUNTER — Ambulatory Visit
Admission: RE | Admit: 2017-05-19 | Discharge: 2017-05-19 | Disposition: A | Discharge status: Home / Self Care | Payer: Medicaid Other | Source: Ambulatory Visit | Attending: Gastroenterology | Admitting: Gastroenterology

## 2017-05-19 ENCOUNTER — Encounter
Admission: RE | Disposition: A | Discharge status: Home / Self Care | Payer: Self-pay | Source: Ambulatory Visit | Attending: Gastroenterology

## 2017-05-19 DIAGNOSIS — Z9989 Dependence on other enabling machines and devices: Secondary | ICD-10-CM | POA: Insufficient documentation

## 2017-05-19 DIAGNOSIS — F17210 Nicotine dependence, cigarettes, uncomplicated: Secondary | ICD-10-CM | POA: Diagnosis not present

## 2017-05-19 DIAGNOSIS — G4730 Sleep apnea, unspecified: Secondary | ICD-10-CM | POA: Insufficient documentation

## 2017-05-19 DIAGNOSIS — K30 Functional dyspepsia: Secondary | ICD-10-CM | POA: Diagnosis not present

## 2017-05-19 DIAGNOSIS — K219 Gastro-esophageal reflux disease without esophagitis: Secondary | ICD-10-CM | POA: Insufficient documentation

## 2017-05-19 DIAGNOSIS — R1013 Epigastric pain: Secondary | ICD-10-CM | POA: Diagnosis present

## 2017-05-19 DIAGNOSIS — K2950 Unspecified chronic gastritis without bleeding: Secondary | ICD-10-CM | POA: Diagnosis not present

## 2017-05-19 DIAGNOSIS — R1011 Right upper quadrant pain: Secondary | ICD-10-CM | POA: Insufficient documentation

## 2017-05-19 HISTORY — PX: ESOPHAGOGASTRODUODENOSCOPY: SHX5428

## 2017-05-19 HISTORY — DX: Diverticulitis of intestine, part unspecified, without perforation or abscess without bleeding: K57.92

## 2017-05-19 HISTORY — DX: Sleep apnea, unspecified: G47.30

## 2017-05-19 SURGERY — EGD (ESOPHAGOGASTRODUODENOSCOPY)
Anesthesia: General | Wound class: Clean Contaminated

## 2017-05-19 MED ORDER — LIDOCAINE HCL (CARDIAC) 20 MG/ML IV SOLN
INTRAVENOUS | Status: DC | PRN
  Administered 2017-05-19: 20 mg via INTRAVENOUS

## 2017-05-19 MED ORDER — FENTANYL CITRATE (PF) 100 MCG/2ML IJ SOLN: 25.0000 ug | INTRAMUSCULAR | Status: DC | PRN

## 2017-05-19 MED ORDER — LACTATED RINGERS IV SOLN
10.0000 mL/h | INTRAVENOUS | Status: DC
  Administered 2017-05-19: 10 mL/h via INTRAVENOUS

## 2017-05-19 MED ORDER — PROPOFOL 10 MG/ML IV BOLUS
INTRAVENOUS | Status: DC | PRN
  Administered 2017-05-19 (×3): 50 mg via INTRAVENOUS
  Administered 2017-05-19: 20 mg via INTRAVENOUS
  Administered 2017-05-19 (×3): 50 mg via INTRAVENOUS

## 2017-05-19 MED ORDER — MEPERIDINE HCL 25 MG/ML IJ SOLN: 6.2500 mg | INTRAMUSCULAR | Status: DC | PRN

## 2017-05-19 MED ORDER — PROMETHAZINE HCL 25 MG/ML IJ SOLN: 6.2500 mg | INTRAMUSCULAR | Status: DC | PRN

## 2017-05-19 MED ORDER — GLYCOPYRROLATE 0.2 MG/ML IJ SOLN
INTRAMUSCULAR | Status: DC | PRN
  Administered 2017-05-19: 0.1 mg via INTRAVENOUS

## 2017-05-19 MED ORDER — OXYCODONE HCL 5 MG PO TABS: 5.0000 mg | ORAL_TABLET | Freq: Once | ORAL | Status: DC | PRN

## 2017-05-19 MED ORDER — OXYCODONE HCL 5 MG/5ML PO SOLN: 5.0000 mg | Freq: Once | ORAL | Status: DC | PRN

## 2017-05-19 SURGICAL SUPPLY — 32 items
BALLN DILATOR 10-12 8 (BALLOONS)
BALLN DILATOR 12-15 8 (BALLOONS)
BALLN DILATOR 15-18 8 (BALLOONS)
BALLN DILATOR CRE 0-12 8 (BALLOONS)
BALLN DILATOR ESOPH 8 10 CRE (MISCELLANEOUS) IMPLANT
BALLOON DILATOR 12-15 8 (BALLOONS) IMPLANT
BALLOON DILATOR 15-18 8 (BALLOONS) IMPLANT
BALLOON DILATOR CRE 0-12 8 (BALLOONS) IMPLANT
BLOCK BITE 60FR ADLT L/F GRN (MISCELLANEOUS) ×2 IMPLANT
CANISTER SUCT 1200ML W/VALVE (MISCELLANEOUS) ×2 IMPLANT
CLIP HMST 235XBRD CATH ROT (MISCELLANEOUS) IMPLANT
CLIP RESOLUTION 360 11X235 (MISCELLANEOUS)
FCP ESCP3.2XJMB 240X2.8X (MISCELLANEOUS)
FORCEPS BIOP RAD 4 LRG CAP 4 (CUTTING FORCEPS) ×1 IMPLANT
FORCEPS BIOP RJ4 240 W/NDL (MISCELLANEOUS)
FORCEPS ESCP3.2XJMB 240X2.8X (MISCELLANEOUS) IMPLANT
GOWN CVR UNV OPN BCK APRN NK (MISCELLANEOUS) ×2 IMPLANT
GOWN ISOL THUMB LOOP REG UNIV (MISCELLANEOUS) ×4
INJECTOR VARIJECT VIN23 (MISCELLANEOUS) IMPLANT
KIT DEFENDO VALVE AND CONN (KITS) IMPLANT
KIT ENDO PROCEDURE OLY (KITS) ×2 IMPLANT
MARKER SPOT ENDO TATTOO 5ML (MISCELLANEOUS) IMPLANT
PAD GROUND ADULT SPLIT (MISCELLANEOUS) IMPLANT
RETRIEVER NET PLAT FOOD (MISCELLANEOUS) IMPLANT
SNARE SHORT THROW 13M SML OVAL (MISCELLANEOUS) IMPLANT
SNARE SHORT THROW 30M LRG OVAL (MISCELLANEOUS) IMPLANT
SPOT EX ENDOSCOPIC TATTOO (MISCELLANEOUS)
SYR INFLATION 60ML (SYRINGE) IMPLANT
TRAP ETRAP POLY (MISCELLANEOUS) IMPLANT
VARIJECT INJECTOR VIN23 (MISCELLANEOUS)
WATER STERILE IRR 250ML POUR (IV SOLUTION) ×2 IMPLANT
WIRE CRE 18-20MM 8CM F G (MISCELLANEOUS) IMPLANT

## 2017-05-19 NOTE — Op Note (Signed)
[facility] Gastroenterology Patient Name: Robyn Ortiz Procedure Date: 05/19/2017 8:48 AM MRN: [id number] Account #: [id number] Date of Birth: 11-05-1985 Admit Type: Outpatient Age: 31 Room: [facility] OR ROOM 01 Gender: Female Note Status: Finalized Procedure:            Upper GI endoscopy Indications:          Epigastric abdominal pain, Dyspepsia Providers:            Lin Landsman MD, MD Referring MD:         Jordan Likes. Lavena Bullion (Referring MD) Medicines:            Monitored Anesthesia Care Complications:        No immediate complications. Estimated blood loss: None. Procedure:            Pre-Anesthesia Assessment:                       - Prior to the procedure, a History and Physical was                        performed, and patient medications and allergies were                        reviewed. The patient is competent. The risks and                        benefits of the procedure and the sedation options and                        risks were discussed with the patient. All questions                        were answered and informed consent was obtained.                        Patient identification and proposed procedure were                        verified by the physician, the nurse, the                        anesthesiologist, the anesthetist and the technician in                        the pre-procedure area in the procedure room. Mental                        Status Examination: alert and oriented. Airway                        Examination: normal oropharyngeal airway and neck                        mobility. Respiratory Examination: clear to                        auscultation. CV Examination: normal. Prophylactic                        Antibiotics: The patient does not require prophylactic  antibiotics. Prior Anticoagulants: The patient has                        taken no previous anticoagulant or antiplatelet agents.                         ASA Grade Assessment: II - A patient with mild systemic                        disease. After reviewing the risks and benefits, the                        patient was deemed in satisfactory condition to undergo                        the procedure. The anesthesia plan was to use monitored                        anesthesia care (MAC). Immediately prior to                        administration of medications, the patient was                        re-assessed for adequacy to receive sedatives. The                        heart rate, respiratory rate, oxygen saturations, blood                        pressure, adequacy of pulmonary ventilation, and                        response to care were monitored throughout the                        procedure. The physical status of the patient was                        re-assessed after the procedure.                       After obtaining informed consent, the endoscope was                        passed under direct vision. Throughout the procedure,                        the patient's blood pressure, pulse, and oxygen                        saturations were monitored continuously. The Olympus                        GIF-HQ190 Endoscope (S#. [id number]) was introduced                        through the mouth, and advanced to the second part of  duodenum. The upper GI endoscopy was accomplished                        without difficulty. The patient tolerated the procedure                        well. Findings:      The duodenal bulb and second portion of the duodenum were normal.      The entire examined stomach was normal. Biopsies were taken with a cold       forceps for Helicobacter pylori testing.      The gastroesophageal junction and examined esophagus were normal. Impression:           - Normal duodenal bulb and second portion of the                        duodenum.                       - Normal stomach.  Biopsied.                       - Normal gastroesophageal junction and esophagus. Recommendation:       - Await pathology results.                       - Discharge patient to home.                       - Resume regular diet.                       - Continue present medications.                       - No aspirin, ibuprofen, naproxen, or other                        non-steroidal anti-inflammatory drugs.                       - Return to my office as previously scheduled. Procedure Code(s):    --- Professional ---                       [phone], Esophagogastroduodenoscopy, flexible, transoral;                        with biopsy, single or multiple Diagnosis Code(s):    --- Professional ---                       R10.13, Epigastric pain CPT copyright 2016 American Medical Association. All rights reserved. The codes documented in this report are preliminary and upon coder review may  be revised to meet current compliance requirements. Dr. Ulyess Mort Lin Landsman MD, MD 05/19/2017 9:10:45 AM This report has been signed electronically. Number of Addenda: 0 Note Initiated On: 05/19/2017 8:48 AM      [facility]

## 2017-05-19 NOTE — Anesthesia Procedure Notes (Signed)
Procedure Name: MAC Date/Time: 05/19/2017 8:55 AM Performed by: Janna Arch Pre-anesthesia Checklist: Patient identified, Emergency Drugs available, Suction available and Patient being monitored Patient Re-evaluated:Patient Re-evaluated prior to induction Oxygen Delivery Method: Nasal cannula

## 2017-05-19 NOTE — Anesthesia Preprocedure Evaluation (Signed)
Anesthesia Evaluation  Patient identified by MRN, date of birth, ID band Patient awake    Reviewed: Allergy & Precautions, H&P , NPO status , Patient's Chart, lab work & pertinent test results, reviewed documented beta blocker date and time   Airway Mallampati: II  TM Distance: >3 FB Neck ROM: full    Dental no notable dental hx.    Pulmonary asthma , sleep apnea and Continuous Positive Airway Pressure Ventilation , Current Smoker,    Pulmonary exam normal breath sounds clear to auscultation       Cardiovascular Exercise Tolerance: Good negative cardio ROS   Rhythm:regular Rate:Normal     Neuro/Psych  Headaches, negative psych ROS   GI/Hepatic Neg liver ROS, GERD  ,  Endo/Other  negative endocrine ROS  Renal/GU negative Renal ROS  negative genitourinary   Musculoskeletal   Abdominal   Peds  Hematology negative hematology ROS (+)   Anesthesia Other Findings   Reproductive/Obstetrics negative OB ROS                             Anesthesia Physical Anesthesia Plan  ASA: II  Anesthesia Plan: General   Post-op Pain Management:    Induction:   PONV Risk Score and Plan:   Airway Management Planned:   Additional Equipment:   Intra-op Plan:   Post-operative Plan:   Informed Consent: I have reviewed the patients History and Physical, chart, labs and discussed the procedure including the risks, benefits and alternatives for the proposed anesthesia with the patient or authorized representative who has indicated his/her understanding and acceptance.   Dental Advisory Given  Plan Discussed with: CRNA  Anesthesia Plan Comments:         Anesthesia Quick Evaluation

## 2017-05-19 NOTE — Transfer of Care (Signed)
Immediate Anesthesia Transfer of Care Note  Patient: Robyn Ortiz  Procedure(s) Performed: ESOPHAGOGASTRODUODENOSCOPY (EGD) (N/A )  Patient Location: PACU  Anesthesia Type: General  Level of Consciousness: awake, alert  and patient cooperative  Airway and Oxygen Therapy: Patient Spontanous Breathing and Patient connected to supplemental oxygen  Post-op Assessment: Post-op Vital signs reviewed, Patient's Cardiovascular Status Stable, Respiratory Function Stable, Patent Airway and No signs of Nausea or vomiting  Post-op Vital Signs: Reviewed and stable  Complications: No apparent anesthesia complications

## 2017-05-19 NOTE — Anesthesia Postprocedure Evaluation (Signed)
Anesthesia Post Note  Patient: Robyn Ortiz  Procedure(s) Performed: ESOPHAGOGASTRODUODENOSCOPY (EGD) (N/A )  Patient location during evaluation: PACU Anesthesia Type: General Level of consciousness: awake and alert Pain management: pain level controlled Vital Signs Assessment: post-procedure vital signs reviewed and stable Respiratory status: spontaneous breathing, nonlabored ventilation, respiratory function stable and patient connected to nasal cannula oxygen Cardiovascular status: blood pressure returned to baseline and stable Postop Assessment: no apparent nausea or vomiting Anesthetic complications: no    Scarlette Slice

## 2017-05-19 NOTE — H&P (Signed)
Arlyss Repress, MD [address]  [address]  [place], [place] [address]  Main: [phone]  Fax: [phone] Pager: [phone]  Primary Care Physician:  Armando Gang, FNP Primary Gastroenterologist:  Dr. Arlyss Repress  Pre-Procedure History & Physical: HPI:  Robyn Ortiz is a 31 y.o. female is here for an endoscopy.   Past Medical History:  Diagnosis Date  . Diverticulitis   . GERD (gastroesophageal reflux disease)   . History of bleeding ulcers    told not to take ibuprofen  . History of bronchitis 52yrs ago  . Migraine   . Obesity   . Sleep apnea    CPAP    Past Surgical History:  Procedure Laterality Date  . CESAREAN SECTION N/A 04/08/2015   Procedure: CESAREAN SECTION;  Surgeon: Catalina Antigua, MD;  Location: [facility];  Service: Obstetrics;  Laterality: N/A;  . CESAREAN SECTION WITH BILATERAL TUBAL LIGATION  04/08/2015  . CHOLECYSTECTOMY  03-27-14   Dr Lemar Livings  . TONSILLECTOMY  03/10/2012   Procedure: TONSILLECTOMY;  Surgeon: Suzanna Obey, MD;  Location: [facility] OR;  Service: ENT;  Laterality: Bilateral;    Prior to Admission medications   Medication Sig Start Date End Date Taking? Authorizing Provider  acetaminophen (TYLENOL) 325 MG tablet Take 650 mg by mouth every 6 (six) hours as needed for moderate pain.   Yes [provider]  cetirizine (ZYRTEC) 10 MG tablet Take 10 mg by mouth daily. 04/04/17  Yes [provider]  cholestyramine (QUESTRAN) 4 g packet Take 1 packet (4 g total) by mouth 2 (two) times daily. 04/14/17 05/14/17 Yes Addeline Calarco, Loel Dubonnet, MD  folic acid (FOLVITE) 1 MG tablet Take 1 mg by mouth daily. 04/04/17  Yes [provider]  metroNIDAZOLE (FLAGYL) 500 MG tablet Take 1 tablet (500 mg total) by mouth 2 (two) times daily. 05/07/17 05/21/17 Yes Rockne Menghini, MD  pantoprazole (PROTONIX) 40 MG tablet Take 40 mg by mouth daily. 04/04/17  Yes [provider]    Allergies as of 04/14/2017 - Review Complete  04/14/2017  Allergen Reaction Noted  . Codeine Other (See Comments) 01/26/2012  . Ibuprofen  11/09/2012    Family History  Problem Relation Age of Onset  . Diabetes Father   . Bipolar disorder Mother   . Heart disease Paternal Grandmother   . Cancer Paternal Grandfather        intestional cancer  . Other Paternal Grandfather        deteriorating disc    Social History   Social History  . Marital status: Married    Spouse name: N/A  . Number of children: N/A  . Years of education: N/A   Occupational History  . Not on file.   Social History Main Topics  . Smoking status: Current Every Day Smoker    Packs/day: 1.00    Years: 13.00    Types: Cigarettes  . Smokeless tobacco: Never Used  . Alcohol use Yes     Comment: beer occasionally  . Drug use: No  . Sexual activity: Yes    Birth control/ protection: Surgical     Comment: BTL   Other Topics Concern  . Not on file   Social History Narrative  . No narrative on file    Review of Systems: See HPI, otherwise negative ROS  Physical Exam: BP (!) 105/92   Pulse 78   Temp 97.9 F (36.6 C) (Temporal)   Resp 16   Ht  (1.651 m)  Wt 234 lb (106.1 kg)   LMP 04/05/2017 Comment: preg testneg  SpO2 99%   BMI 38.94 kg/m  General:   Alert,  pleasant and cooperative in NAD Head:  Normocephalic and atraumatic. Neck:  Supple; no masses or thyromegaly. Lungs:  Clear throughout to auscultation.    Heart:  Regular rate and rhythm. Abdomen:  Soft, nontender and nondistended. Normal bowel sounds, without guarding, and without rebound.   Neurologic:  Alert and  oriented x4;  grossly normal neurologically.  Impression/Plan: Robyn Ortiz is here for an endoscopy to be performed for Epigastric pain and right upper quadrant pain  Risks, benefits, limitations, and alternatives regarding  endoscopy have been reviewed with the patient.  Questions have been answered.  All parties agreeable.   Lannette Donath, MD   05/19/2017, 7:31 AM

## 2017-05-20 ENCOUNTER — Encounter: Payer: Self-pay | Admitting: Gastroenterology

## 2017-05-25 ENCOUNTER — Encounter: Payer: Self-pay | Admitting: Gastroenterology

## 2017-10-06 ENCOUNTER — Encounter: Payer: Self-pay | Admitting: Gastroenterology

## 2017-10-06 ENCOUNTER — Ambulatory Visit: Payer: Medicaid Other | Admitting: Gastroenterology

## 2017-10-06 VITALS — BP 115/79 | HR 77 | Temp 98.0°F | Ht 65.0 in | Wt 228.8 lb

## 2017-10-06 DIAGNOSIS — R1013 Epigastric pain: Secondary | ICD-10-CM | POA: Diagnosis not present

## 2017-10-06 DIAGNOSIS — K580 Irritable bowel syndrome with diarrhea: Secondary | ICD-10-CM | POA: Diagnosis not present

## 2017-10-06 MED ORDER — AMITRIPTYLINE HCL 25 MG PO TABS: 25.0000 mg | ORAL_TABLET | Freq: Every day | ORAL | 2 refills | Status: DC

## 2017-10-06 MED ORDER — DICYCLOMINE HCL 10 MG PO CAPS: 10.0000 mg | ORAL_CAPSULE | Freq: Three times a day (TID) | ORAL | 0 refills | Status: DC

## 2017-10-06 NOTE — Progress Notes (Signed)
Arlyss Repressohini R Vanga, MD [address]  [address]  [place], [place] [address]  Main: [phone]  Fax: [phone]    Gastroenterology Consultation  Referring Provider:     Armando GangLindley, Cheryl P, FNP Primary Care Physician:  Armando GangLindley, Cheryl P, FNP Primary Gastroenterologist:  Dr. Arlyss Repressohini R Vanga Reason for Consultation:     Dyspepsia and diarrhea        HPI:   Robyn Aurea GraffM Ridley is a 32 y.o. female referred by Dr. Armando GangLindley, Cheryl P, FNP  for consultation & management of Epigastric pain and right upper quadrant pain, sharp knot-like sensation worse after eating associated with nausea. She has been having these symptoms for at least 40 years but got worse after her last child was born 2 years ago. She has been taking ibuprofen 600 mg twice daily both for abdominal pain and headache. She has been gaining weight, denies loss of appetite, melena, rectal bleeding, trouble swallowing. She is also suffering from nonbloody diarrhea, after each meal since she had a cholecystectomy. She denies hematochezia, blood mixed with stool, lower abdominal pain, urgency, constipation. She had gallbladder removal for her upper abdominal symptoms, however her symptoms continued. She smokes daily but does not drink alcohol. She takes Protonix 40 mg daily.   Follow-up visit 10/06/2017: Patient underwent EGD which was unremarkable. There was no evidence of H. pylori infection. She tried cholestyramine which resulted in constipation. She is taking Protonix as needed for heartburn. She continues to have epigastric pain, abdominal cramps and frequent bowel movements with bloating. She drinks sodas, one can a day. She consumes red meat daily. She has gained weight since switching her job. She continues to smoke cigarettes. She is with her husband today. Her CBC and ferritin were normal. Celiac serologies were negative. She does have chronic history of headaches and takes BC powder as needed.  She takes care of 6 kids, 3 are  adopted  She denies family history of GI malignancy GI Procedures: EGD 06/05/2017 normal   Past Medical History:  Diagnosis Date  . Diverticulitis   . GERD (gastroesophageal reflux disease)   . History of bleeding ulcers    told not to take ibuprofen  . History of bronchitis 8225yrs ago  . Migraine   . Obesity   . Sleep apnea    CPAP    Past Surgical History:  Procedure Laterality Date  . CESAREAN SECTION N/A 04/08/2015   Procedure: CESAREAN SECTION;  Surgeon: Catalina AntiguaPeggy Constant, MD;  Location: [facility];  Service: Obstetrics;  Laterality: N/A;  . CESAREAN SECTION WITH BILATERAL TUBAL LIGATION  04/08/2015  . CHOLECYSTECTOMY  03-27-14   Dr Lemar LivingsByrnett  . ESOPHAGOGASTRODUODENOSCOPY N/A 05/19/2017   Procedure: ESOPHAGOGASTRODUODENOSCOPY (EGD);  Surgeon: Toney ReilVanga, Rohini Reddy, MD;  [facility];  Service: Endoscopy;  Laterality: N/A;  . TONSILLECTOMY  03/10/2012   Procedure: TONSILLECTOMY;  Surgeon: Suzanna ObeyJohn Byers, MD;  [facility];  Service: ENT;  Laterality: Bilateral;    Prior to Admission medications   Medication Sig Start Date End Date Taking? Authorizing Provider  cetirizine (ZYRTEC) 10 MG tablet Take 10 mg by mouth daily. 04/04/17  Yes [provider]  folic acid (FOLVITE) 1 MG tablet Take 1 mg by mouth daily. 04/04/17  Yes [provider]  pantoprazole (PROTONIX) 40 MG tablet Take 40 mg by mouth daily. 04/04/17  Yes [provider]    Family History  Problem Relation Age of Onset  . Diabetes Father   . Bipolar disorder Mother   .  Heart disease Paternal Grandmother   . Cancer Paternal Grandfather        intestional cancer  . Other Paternal Grandfather        deteriorating disc     Social History   Tobacco Use  . Smoking status: Current Every Day Smoker    Packs/day: 1.00    Years: 13.00    Pack years: 13.00    Types: Cigarettes  . Smokeless tobacco: Never Used  Substance Use Topics  . Alcohol use: Yes    Comment: beer occasionally    . Drug use: No    Allergies as of 10/06/2017 - Review Complete 10/06/2017  Allergen Reaction Noted  . Codeine Other (See Comments) 01/26/2012  . Ibuprofen  11/09/2012    Review of Systems:    All systems reviewed and negative except where noted in HPI.   Physical Exam:  BP 115/79 (Patient Position: Sitting)   Pulse 77   Temp 98 F (36.7 C)   Ht 5\' 5"  (1.651 m)   Wt 228 lb 12.8 oz (103.8 kg)   BMI 38.07 kg/m  No LMP recorded.  General:   Alert,  Well-developed, well-nourished, pleasant and cooperative in NAD Head:  Normocephalic and atraumatic. Eyes:  Sclera clear, no icterus.   Conjunctiva pink. Ears:  Normal auditory acuity. Nose:  No deformity, discharge, or lesions. Mouth:  No deformity or lesions,oropharynx pink & moist. Neck:  Supple; no masses or thyromegaly. Lungs:  Respirations even and unlabored.  Clear throughout to auscultation.   No wheezes, crackles, or rhonchi. No acute distress. Heart:  Regular rate and rhythm; no murmurs, clicks, rubs, or gallops. Abdomen:  Normal bowel sounds.  No bruits.  Soft, non-tender and non-distended without masses, hepatosplenomegaly or hernias noted.  No guarding or rebound tenderness.   Rectal: Nor performed Msk:  Symmetrical without gross deformities. Good, equal movement & strength bilaterally. Pulses:  Normal pulses noted. Extremities:  No clubbing or edema.  No cyanosis. Neurologic:  Alert and oriented x3;  grossly normal neurologically. Skin:  Intact without significant lesions or rashes. No jaundice. Lymph Nodes:  No significant cervical adenopathy. Psych:  Alert and cooperative. Normal mood and affect.  Imaging Studies: Reviewed  Assessment and Plan:   Trust LARSEN ZETTEL is a 32 y.o. female status postcholecystectomy with Chronic symptoms of dyspepsia and nonbloody post prandial diarrhea. Heavy NSAID use for abdominal pain and headaches. Here for follow-up.   Dyspepsia: Functional EGD normal including gastric  biopsies Trial of FD guard Stop protonix  IBS- Diarrhea Discontinue Cholestyramine Start amitriptyline 25 mg at bedtime Dicyclomine 10 mg every 8 hours and at bedtime Discussed with patient about dietary modifications including avoiding sodas, red meat, fried foods, artificial sweeteners. Incorporating more healthy diet, low carb Encouraged her to lose weight with diet and exercise  Follow up in 2 months or sooner if needed Asked her to contact me via my chart  Arlyss Repress, MD

## 2017-10-31 ENCOUNTER — Other Ambulatory Visit: Payer: Self-pay

## 2017-10-31 ENCOUNTER — Emergency Department
Admission: EM | Admit: 2017-10-31 | Discharge: 2017-10-31 | Disposition: A | Discharge status: Home / Self Care | Payer: Medicaid Other | Attending: Emergency Medicine | Admitting: Emergency Medicine

## 2017-10-31 ENCOUNTER — Emergency Department: Payer: Medicaid Other

## 2017-10-31 ENCOUNTER — Encounter: Payer: Self-pay | Admitting: Emergency Medicine

## 2017-10-31 DIAGNOSIS — W109XXA Fall (on) (from) unspecified stairs and steps, initial encounter: Secondary | ICD-10-CM | POA: Insufficient documentation

## 2017-10-31 DIAGNOSIS — Y990 Civilian activity done for income or pay: Secondary | ICD-10-CM | POA: Insufficient documentation

## 2017-10-31 DIAGNOSIS — M545 Low back pain, unspecified: Secondary | ICD-10-CM

## 2017-10-31 DIAGNOSIS — Y9289 Other specified places as the place of occurrence of the external cause: Secondary | ICD-10-CM | POA: Diagnosis not present

## 2017-10-31 DIAGNOSIS — Z79899 Other long term (current) drug therapy: Secondary | ICD-10-CM | POA: Diagnosis not present

## 2017-10-31 DIAGNOSIS — Y939 Activity, unspecified: Secondary | ICD-10-CM | POA: Diagnosis not present

## 2017-10-31 DIAGNOSIS — F17210 Nicotine dependence, cigarettes, uncomplicated: Secondary | ICD-10-CM | POA: Diagnosis not present

## 2017-10-31 LAB — POCT PREGNANCY, URINE: Preg Test, Ur: NEGATIVE

## 2017-10-31 MED ORDER — CYCLOBENZAPRINE HCL 10 MG PO TABS
5.0000 mg | ORAL_TABLET | Freq: Once | ORAL | Status: AC
  Administered 2017-10-31: 5 mg via ORAL
  Filled 2017-10-31: qty 1

## 2017-10-31 MED ORDER — CYCLOBENZAPRINE HCL 5 MG PO TABS: ORAL_TABLET | ORAL | 0 refills | Status: DC

## 2017-10-31 NOTE — ED Notes (Signed)
Pt a/o, vss. Back pain that started Friday s/p fall on concrete. Pt denies any radiation or changes in bowel or bladder. Injured area without bruise, skin intact.

## 2017-10-31 NOTE — ED Notes (Addendum)
Supervisor MerrillStacy [phone]- [phone] at [employer] [facility])

## 2017-10-31 NOTE — ED Triage Notes (Signed)
Pt arrived via POV from home with reports of fall while working on Friday afternoon.  Pt works for [employer], states she was at her last stop when she slipped on a step and fell onto her back and right upper side.  Pt states today is the worst day of her pain, using Ibuprofen and tylenol without relief.  Pt ambulatory in triage.

## 2017-10-31 NOTE — ED Provider Notes (Signed)
[facility] Emergency Department Provider Note  ____________________________________________  Time seen: Approximately 2:33 PM  I have reviewed the triage vital signs and the nursing notes.   HISTORY  Chief Complaint Back Pain (Work Comp)    HPI Robyn Ortiz is a 32 y.o. female that presents to the emergency department for evaluation of right shoulder and low back pain after falling this afternoon.  Patient was going down steps when she slipped and landed on her back and right shoulder.  She is able to move her shoulder normally but feels a pulling sensation in the muscles when she does.  Pain is primarily in her low back.  She has a headache on both temples.  She did not lose consciousness.  No shortness of breath, chest pain, abdominal pain, numbness, tingling.  Past Medical History:  Diagnosis Date  . Diverticulitis   . GERD (gastroesophageal reflux disease)   . History of bleeding ulcers    told not to take ibuprofen  . History of bronchitis 3277yrs ago  . Migraine   . Obesity   . Sleep apnea    CPAP    Patient Active Problem List   Diagnosis Date Noted  . 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 Eczema 09/04/2014  . Abnormal quad screen 03/03/2013  . Obesity (BMI 30-39.9) 11/21/2012  . Asthma 11/21/2012  . Current smoker 11/21/2012  . Condyloma acuminatum of perianal region 11/21/2012  . Migraine 11/21/2012    Past Surgical History:  Procedure Laterality Date  . CESAREAN SECTION N/A 04/08/2015   Procedure: CESAREAN SECTION;  Surgeon: Catalina AntiguaPeggy Constant, MD;  Location: [facility];  Service: Obstetrics;  Laterality: N/A;  . CESAREAN SECTION WITH BILATERAL TUBAL LIGATION  04/08/2015  . CHOLECYSTECTOMY  03-27-14   Dr Lemar LivingsByrnett  . ESOPHAGOGASTRODUODENOSCOPY N/A 05/19/2017   Procedure: ESOPHAGOGASTRODUODENOSCOPY (EGD);  Surgeon: Toney ReilVanga, Rohini Reddy, MD;  [facility];  Service: Endoscopy;  Laterality: N/A;  . TONSILLECTOMY  03/10/2012    Procedure: TONSILLECTOMY;  Surgeon: Suzanna ObeyJohn Byers, MD;  [facility];  Service: ENT;  Laterality: Bilateral;    Prior to Admission medications   Medication Sig Start Date End Date Taking? Authorizing Provider  acetaminophen (TYLENOL) 325 MG tablet Take 650 mg by mouth every 6 (six) hours as needed for moderate pain.    [provider]  amitriptyline (ELAVIL) 25 MG tablet Take 1 tablet (25 mg total) by mouth at bedtime. 10/06/17 11/05/17  Toney ReilVanga, Rohini Reddy, MD  cetirizine (ZYRTEC) 10 MG tablet Take 10 mg by mouth daily. 04/04/17   [provider]  cyclobenzaprine (FLEXERIL) 5 MG tablet Take 1-2 tablets 3 times daily as needed 10/31/17   Enid DerryWagner, Kyion Gautier, PA-C  dicyclomine (BENTYL) 10 MG capsule Take 1 capsule (10 mg total) by mouth 4 (four) times daily -  before meals and at bedtime. 10/06/17 11/05/17  Toney ReilVanga, Rohini Reddy, MD    Allergies Codeine and Ibuprofen  Family History  Problem Relation Age of Onset  . Diabetes Father   . Bipolar disorder Mother   . Heart disease Paternal Grandmother   . Cancer Paternal Grandfather        intestional cancer  . Other Paternal Grandfather        deteriorating disc    Social History Social History   Tobacco Use  . Smoking status: Current Every Day Smoker    Packs/day: 1.00    Years: 13.00    Pack years: 13.00    Types: Cigarettes  . Smokeless tobacco: Never Used  Substance Use Topics  . Alcohol use: Yes    Comment: beer occasionally  . Drug use: No     Review of Systems  ENT: Negative for congestion and rhinorrhea. Cardiovascular: No chest pain. Respiratory: No SOB. Gastrointestinal: No abdominal pain.  No nausea, no vomiting.  Musculoskeletal: Positive for back and shoulder pain. Skin: Negative for rash, abrasions, lacerations, ecchymosis.    ____________________________________________   PHYSICAL EXAM:  VITAL SIGNS: ED Triage Vitals [10/31/17 1228]  Enc Vitals Group     BP 132/81     Pulse Rate 64      Resp 20     Temp 97.9 F (36.6 C)     Temp Source Oral     SpO2 98 %     Weight 225 lb (102.1 kg)     Height 5\' 5"  (1.651 m)     Head Circumference      Peak Flow      Pain Score 8     Pain Loc      Pain Edu?      Excl. in GC?      Constitutional: Alert and oriented. Well appearing and in no acute distress. Eyes: Conjunctivae are normal. PERRL. EOMI. No discharge. Head: Atraumatic. ENT: No frontal and maxillary sinus tenderness.      Ears:       Nose: No congestion/rhinnorhea.      Mouth/Throat: Mucous membranes are moist.  Neck: No stridor.   Hematological/Lymphatic/Immunilogical: No cervical lymphadenopathy. Cardiovascular: Normal rate, regular rhythm.  Good peripheral circulation. Respiratory: Normal respiratory effort without tachypnea or retractions. Lungs CTAB. Good air entry to the bases with no decreased or absent breath sounds. Gastrointestinal: Bowel sounds 4 quadrants. Soft and nontender to palpation. No guarding or rigidity. No palpable masses. No distention. Musculoskeletal: Full range of motion to all extremities. No gross deformities appreciated.  Tenderness to palpation over right lumbar paraspinal muscles.  Full range of motion of right shoulder.  Strength equal in upper and lower externally's bilaterally. Neurologic:  Normal speech and language. No gross focal neurologic deficits are appreciated.  Skin:  Skin is warm, dry and intact. No rash noted.   ____________________________________________   LABS (all labs ordered are listed, but only abnormal results are displayed)  Labs Reviewed  POC URINE PREG, ED  POCT PREGNANCY, URINE   ____________________________________________  EKG   ____________________________________________  RADIOLOGY Lexine Baton, personally viewed and evaluated these images (plain radiographs) as part of my medical decision making, as well as reviewing the written report by the radiologist.  Dg Lumbar Spine  Complete  Result Date: 10/31/2017 CLINICAL DATA:  Pain following fall EXAM: LUMBAR SPINE - COMPLETE 4+ VIEW COMPARISON:  None. FINDINGS: Frontal, lateral, spot lumbosacral lateral, and bilateral oblique views were obtained. There are 5 non-rib-bearing lumbar type vertebral bodies. There is no fracture. There is 5 mm of retrolisthesis of L5 on S1. There is no other spondylolisthesis. There is mild disc space narrowing at L5-S1, moderate. Disc spaces otherwise appear normal. There is facet osteoarthritic change at L4-5 bilaterally and at L5-S1 on the right. No erosive change. IMPRESSION: Osteoarthritic change at L5-S1. Spondylolisthesis at L5-S1 is felt to be due to underlying spondylosis. No other spondylolisthesis. There is also facet osteoarthritic change at L4-5 bilaterally. No fracture. Electronically Signed   By: Bretta Bang III M.D.   On: 10/31/2017 15:40    ____________________________________________    PROCEDURES  Procedure(s) performed:    Procedures    Medications  cyclobenzaprine (FLEXERIL) tablet  5 mg (5 mg Oral Given 10/31/17 1621)     ____________________________________________   INITIAL IMPRESSION / ASSESSMENT AND PLAN / ED COURSE  Pertinent labs & imaging results that were available during my care of the patient were reviewed by me and considered in my medical decision making (see chart for details).  Review of the [place] CSRS was performed in accordance of the [facility] prior to dispensing any controlled drugs.   Patient that presents to the emergency department for evaluation after fall. Vital signs and exam are reassuring.  Pain is likely musculoskeletal.  Patient does not have any pinpoint tenderness over lumbar spine. She is walking without difficulty. All xray findings were discussed with patient. Patient feels comfortable going home. Patient will be discharged home with prescriptions for flexeril. Patient is to follow up with PCP as needed or otherwise directed.  Patient is given ED precautions to return to the ED for any worsening or new symptoms.   ____________________________________________  FINAL CLINICAL IMPRESSION(S) / ED DIAGNOSES  Final diagnoses:  Acute bilateral low back pain without sciatica      NEW MEDICATIONS STARTED DURING THIS VISIT:  ED Discharge Orders        Ordered    cyclobenzaprine (FLEXERIL) 5 MG tablet     10/31/17 1556          This chart was dictated using voice recognition software/Dragon. Despite best efforts to proofread, errors can occur which can change the meaning. Any change was purely unintentional.    Enid Derry, PA-C 10/31/17 1654    Emily Filbert, MD 11/03/17 0930

## 2017-11-10 ENCOUNTER — Other Ambulatory Visit: Payer: Self-pay | Admitting: Gastroenterology

## 2017-11-10 DIAGNOSIS — K580 Irritable bowel syndrome with diarrhea: Secondary | ICD-10-CM

## 2017-12-08 ENCOUNTER — Encounter: Payer: Self-pay | Admitting: Gastroenterology

## 2017-12-08 ENCOUNTER — Ambulatory Visit: Payer: Medicaid Other | Admitting: Gastroenterology

## 2017-12-08 VITALS — BP 111/71 | HR 78 | Ht 65.0 in | Wt 215.0 lb

## 2017-12-08 DIAGNOSIS — K580 Irritable bowel syndrome with diarrhea: Secondary | ICD-10-CM | POA: Diagnosis not present

## 2017-12-08 NOTE — Progress Notes (Signed)
Arlyss Repress, MD [address]  [address]  [place], [place] [address]  Main: [phone]  Fax: [phone]    Gastroenterology Consultation  Referring Provider:     Armando Gang, FNP Primary Care Physician:  Armando Gang, FNP Primary Gastroenterologist:  Dr. Arlyss Repress Reason for Consultation:     Dyspepsia and diarrhea        HPI:   Robyn Ortiz is a 32 y.o. female referred by Dr. Armando Gang, FNP  for consultation & management of Epigastric pain and right upper quadrant pain, sharp knot-like sensation worse after eating associated with nausea. She has been having these symptoms for at least 40 years but got worse after her last child was born 2 years ago. She has been taking ibuprofen 600 mg twice daily both for abdominal pain and headache. She has been gaining weight, denies loss of appetite, melena, rectal bleeding, trouble swallowing. She is also suffering from nonbloody diarrhea, after each meal since she had a cholecystectomy. She denies hematochezia, blood mixed with stool, lower abdominal pain, urgency, constipation. She had gallbladder removal for her upper abdominal symptoms, however her symptoms continued. She smokes daily but does not drink alcohol. She takes Protonix 40 mg daily.   Follow-up visit 10/06/2017: Patient underwent EGD which was unremarkable. There was no evidence of H. pylori infection. She tried cholestyramine which resulted in constipation. She is taking Protonix as needed for heartburn. She continues to have epigastric pain, abdominal cramps and frequent bowel movements with bloating. She drinks sodas, one can a day. She consumes red meat daily. She has gained weight since switching her job. She continues to smoke cigarettes. She is with her husband today. Her CBC and ferritin were normal. Celiac serologies were negative. She does have chronic history of headaches and takes BC powder as needed.  Follow-up visit 12/08/2017 She  lost about 15 pounds intentionally since last visit the following healthy diet. She continues to have increased bowel frequency associated with abdominal cramps. She is taking cholestyramine daily and does not take on Saturday which results in severe diarrhea. She does not have a bowel movement while on cholestyramine. She is on amitriptyline 25 mg at bedtime. She tried FD guard which did not help. She continues to smoke.   She takes care of 6 kids, 3 are adopted  She denies family history of GI malignancy GI Procedures: EGD 06/05/2017 normal   Past Medical History:  Diagnosis Date  . Diverticulitis   . GERD (gastroesophageal reflux disease)   . History of bleeding ulcers    told not to take ibuprofen  . History of bronchitis 23yrs ago  . Migraine   . Obesity   . Sleep apnea    CPAP    Past Surgical History:  Procedure Laterality Date  . CESAREAN SECTION N/A 04/08/2015   Procedure: CESAREAN SECTION;  Surgeon: Catalina Antigua, MD;  Location: [facility];  Service: Obstetrics;  Laterality: N/A;  . CESAREAN SECTION WITH BILATERAL TUBAL LIGATION  04/08/2015  . CHOLECYSTECTOMY  03-27-14   Dr Lemar Livings  . ESOPHAGOGASTRODUODENOSCOPY N/A 05/19/2017   Procedure: ESOPHAGOGASTRODUODENOSCOPY (EGD);  Surgeon: Toney Reil, MD;  [facility];  Service: Endoscopy;  Laterality: N/A;  . TONSILLECTOMY  03/10/2012   Procedure: TONSILLECTOMY;  Surgeon: Suzanna Obey, MD;  Location: [facility] OR;  Service: ENT;  Laterality: Bilateral;     Current Outpatient Medications:  .  cetirizine (ZYRTEC) 10 MG tablet, Take 10 mg by mouth daily.,  Disp: , Rfl: 5 .  acetaminophen (TYLENOL) 325 MG tablet, Take 650 mg by mouth every 6 (six) hours as needed for moderate pain., Disp: , Rfl:  .  amitriptyline (ELAVIL) 25 MG tablet, Take 1 tablet (25 mg total) by mouth at bedtime., Disp: 30 tablet, Rfl: 2 .  cyclobenzaprine (FLEXERIL) 5 MG tablet, Take 1-2 tablets 3 times daily as needed (Patient not taking: Reported  on 12/08/2017), Disp: 20 tablet, Rfl: 0 .  dicyclomine (BENTYL) 10 MG capsule, Take 1 capsule (10 mg total) by mouth 4 (four) times daily -  before meals and at bedtime., Disp: 30 capsule, Rfl: 0   Family History  Problem Relation Age of Onset  . Diabetes Father   . Bipolar disorder Mother   . Heart disease Paternal Grandmother   . Cancer Paternal Grandfather        intestional cancer  . Other Paternal Grandfather        deteriorating disc     Social History   Tobacco Use  . Smoking status: Current Every Day Smoker    Packs/day: 1.00    Years: 13.00    Pack years: 13.00    Types: Cigarettes  . Smokeless tobacco: Never Used  Substance Use Topics  . Alcohol use: Yes    Comment: beer occasionally  . Drug use: No    Allergies as of 12/08/2017 - Review Complete 12/08/2017  Allergen Reaction Noted  . Codeine Other (See Comments) 01/26/2012  . Ibuprofen  11/09/2012    Review of Systems:    All systems reviewed and negative except where noted in HPI.   Physical Exam:  BP 111/71   Pulse 78   Ht 5\' 5"  (1.651 m)   Wt 215 lb (97.5 kg)   BMI 35.78 kg/m  No LMP recorded.  General:   Alert,  Well-developed, well-nourished, pleasant and cooperative in NAD Head:  Normocephalic and atraumatic. Eyes:  Sclera clear, no icterus.   Conjunctiva pink. Ears:  Normal auditory acuity. Nose:  No deformity, discharge, or lesions. Mouth:  No deformity or lesions,oropharynx pink & moist. Neck:  Supple; no masses or thyromegaly. Lungs:  Respirations even and unlabored.  Clear throughout to auscultation.   No wheezes, crackles, or rhonchi. No acute distress. Heart:  Regular rate and rhythm; no murmurs, clicks, rubs, or gallops. Abdomen:  Normal bowel sounds.  No bruits.  Soft, non-tender and non-distended without masses, hepatosplenomegaly or hernias noted.  No guarding or rebound tenderness.   Rectal: Nor performed Msk:  Symmetrical without gross deformities. Good, equal movement & strength  bilaterally. Pulses:  Normal pulses noted. Extremities:  No clubbing or edema.  No cyanosis. Neurologic:  Alert and oriented x3;  grossly normal neurologically. Skin:  Intact without significant lesions or rashes. No jaundice. Lymph Nodes:  No significant cervical adenopathy. Psych:  Alert and cooperative. Normal mood and affect.  Imaging Studies: Reviewed  Assessment and Plan:   Robyn Ortiz is a 32 y.o. female status postcholecystectomy with Chronic symptoms of dyspepsia and nonbloody post prandial diarrhea. Heavy NSAID use for abdominal pain and headaches. Here for follow-up.   Dyspepsia: Functional EGD normal including gastric biopsies   IBS- Diarrhea Check stool for C. Difficile and other GI pathogens Check TTG IgA, ferritin, CBC Decrease Cholestyramine to half packet daily Increase amitriptyline to 50mg  at bedtime Dicyclomine 10 mg every 8 hours and at bedtime Discussed with patient about dietary modifications including avoiding sodas, red meat, fried foods, artificial sweeteners. Incorporating more healthy diet,  low carb Encouraged her to lose weight with diet and exercise  Follow up in 2 months or sooner if needed  Asked her to contact me via my chart  Arlyss Repressohini R Dorethia Jeanmarie, MD

## 2017-12-09 ENCOUNTER — Other Ambulatory Visit
Admission: RE | Admit: 2017-12-09 | Discharge: 2017-12-09 | Disposition: A | Discharge status: Home / Self Care | Payer: Medicaid Other | Source: Ambulatory Visit | Attending: Gastroenterology | Admitting: Gastroenterology

## 2017-12-09 DIAGNOSIS — K580 Irritable bowel syndrome with diarrhea: Secondary | ICD-10-CM | POA: Insufficient documentation

## 2017-12-09 LAB — HEPATIC FUNCTION PANEL
ALK PHOS: 52 U/L (ref 38–126)
ALT: 23 U/L (ref 14–54)
AST: 18 U/L (ref 15–41)
Albumin: 4.4 g/dL (ref 3.5–5.0)
BILIRUBIN DIRECT: 0.1 mg/dL (ref 0.1–0.5)
BILIRUBIN TOTAL: 0.6 mg/dL (ref 0.3–1.2)
Indirect Bilirubin: 0.5 mg/dL (ref 0.3–0.9)
Total Protein: 7.5 g/dL (ref 6.5–8.1)

## 2017-12-09 LAB — CBC
HCT: 41.8 % (ref 35.0–47.0)
Hemoglobin: 14.5 g/dL (ref 12.0–16.0)
MCH: 32.3 pg (ref 26.0–34.0)
MCHC: 34.6 g/dL (ref 32.0–36.0)
MCV: 93.5 fL (ref 80.0–100.0)
PLATELETS: 268 10*3/uL (ref 150–440)
RBC: 4.47 MIL/uL (ref 3.80–5.20)
RDW: 13.7 % (ref 11.5–14.5)
WBC: 5.4 10*3/uL (ref 3.6–11.0)

## 2017-12-09 LAB — GASTROINTESTINAL PANEL BY PCR, STOOL (REPLACES STOOL CULTURE)

## 2017-12-09 LAB — IRON AND TIBC
Iron: 111 ug/dL (ref 28–170)
SATURATION RATIOS: 28 % (ref 10.4–31.8)
TIBC: 398 ug/dL (ref 250–450)
UIBC: 287 ug/dL

## 2017-12-09 LAB — C DIFFICILE QUICK SCREEN W PCR REFLEX
C DIFFICILE (CDIFF) INTERP: NOT DETECTED
C DIFFICILE (CDIFF) TOXIN: NEGATIVE
C DIFFICLE (CDIFF) ANTIGEN: NEGATIVE

## 2017-12-09 LAB — FERRITIN: FERRITIN: 24 ng/mL (ref 11–307)

## 2017-12-10 LAB — IGA: IgA: 124 mg/dL (ref 87–352)

## 2017-12-13 LAB — TISSUE TRANSGLUTAMINASE, IGA: Tissue Transglutaminase Ab, IgA: <2 U/mL (ref 0–3)

## 2018-02-07 ENCOUNTER — Ambulatory Visit: Payer: Self-pay | Admitting: Gastroenterology

## 2018-02-09 ENCOUNTER — Other Ambulatory Visit: Payer: Self-pay

## 2018-02-09 ENCOUNTER — Ambulatory Visit: Payer: Medicaid Other | Admitting: Gastroenterology

## 2018-05-09 IMAGING — CT CT ABD-PELV W/ CM
2 of 4 series · 16 of 46 positions shown, 18 images · IV contrast (APPLIED)
Comparison: None.

CLINICAL DATA: Sharp left lower quadrant pain beginning today.
Left-sided tenderness.

EXAM:
CT ABDOMEN AND PELVIS WITH CONTRAST
TECHNIQUE: Multidetector CT imaging of the abdomen and pelvis was performed
using the standard protocol following bolus administration of
intravenous contrast.
CONTRAST:  100mL 2TYHLM-Q00 IOPAMIDOL (2TYHLM-Q00) INJECTION 61%

[Series 2: routine abd/pel with · axial · 0.95mm/px · z∈[-1048,-598]mm · 13 of 100 slices shown, 15 images]
[im 5/100  soft-tissue]
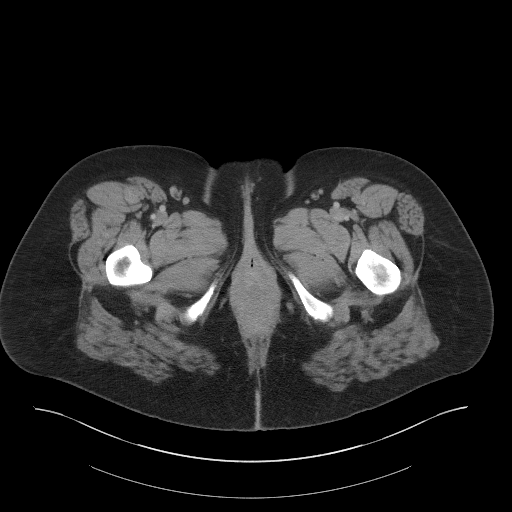
[im 5/100  bone]
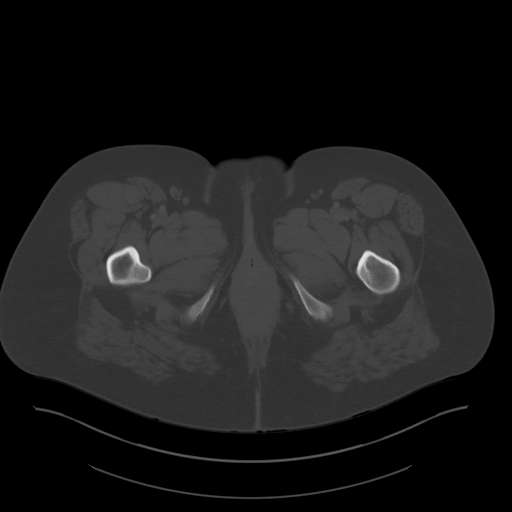
[im 13/100  soft-tissue]
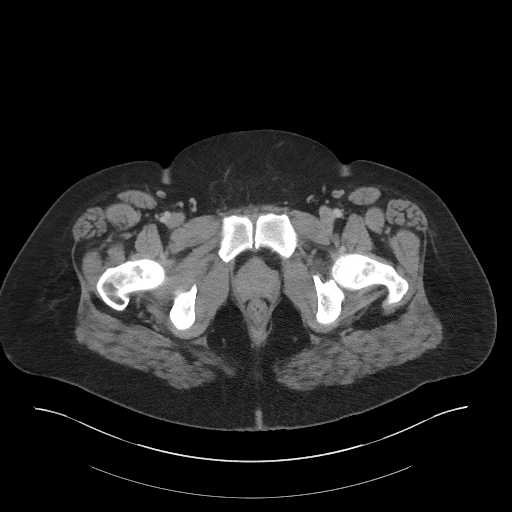
[im 22/100  soft-tissue]
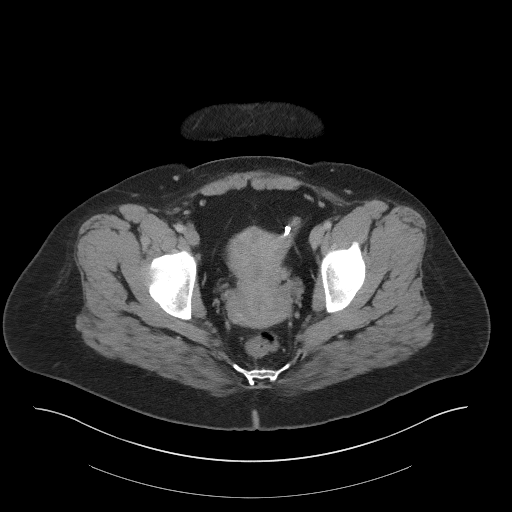
[im 26/100  soft-tissue]
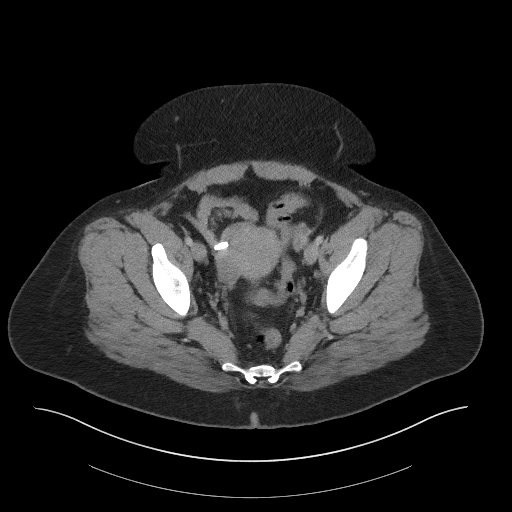
[im 35/100  soft-tissue]
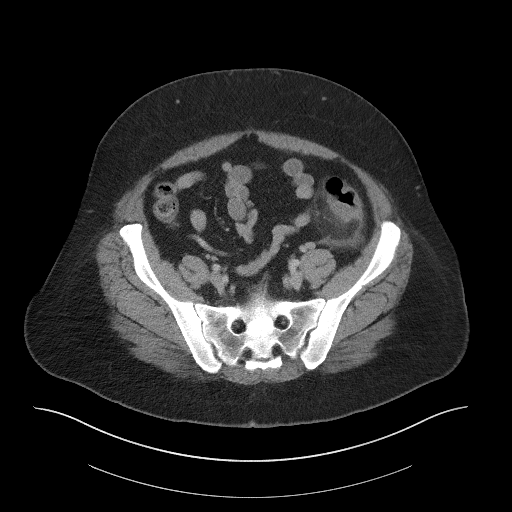
[im 44/100  soft-tissue]
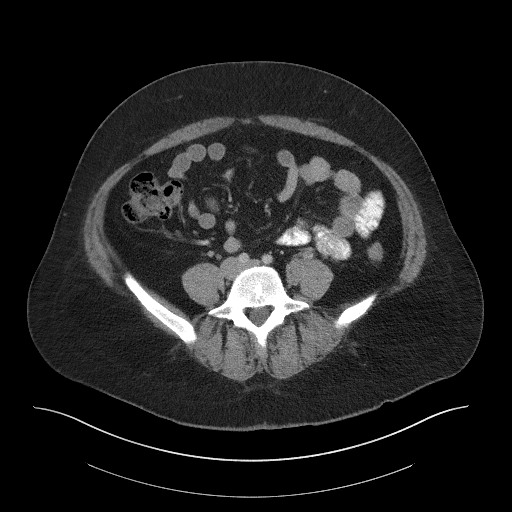
[im 52/100  soft-tissue]
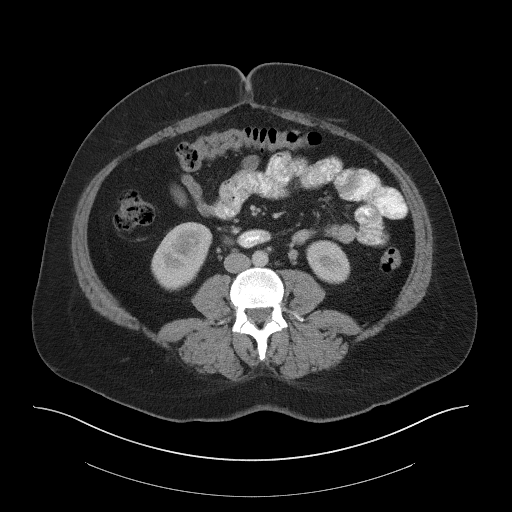
[im 56/100  soft-tissue]
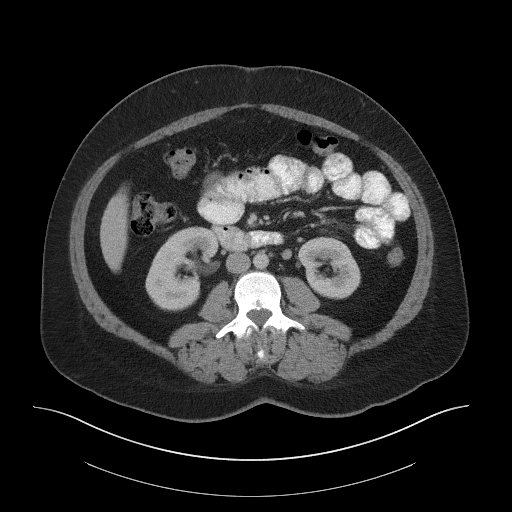
[im 65/100  soft-tissue]
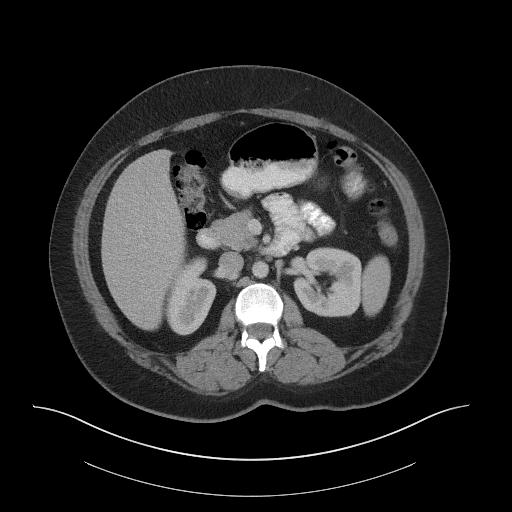
[im 65/100  bone]
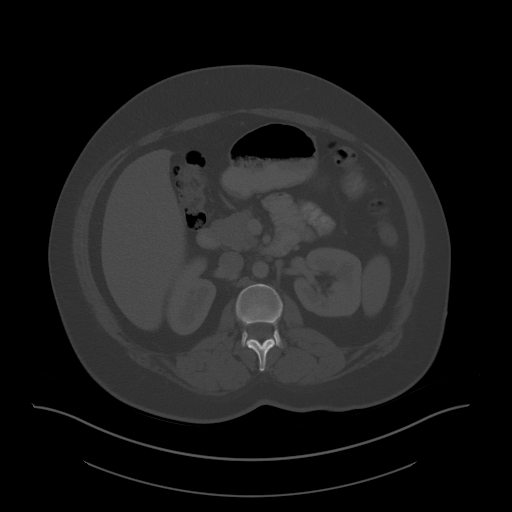
[im 74/100  soft-tissue]
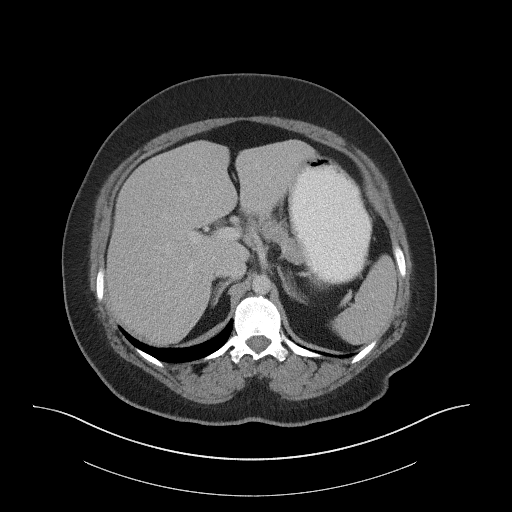
[im 78/100  soft-tissue]
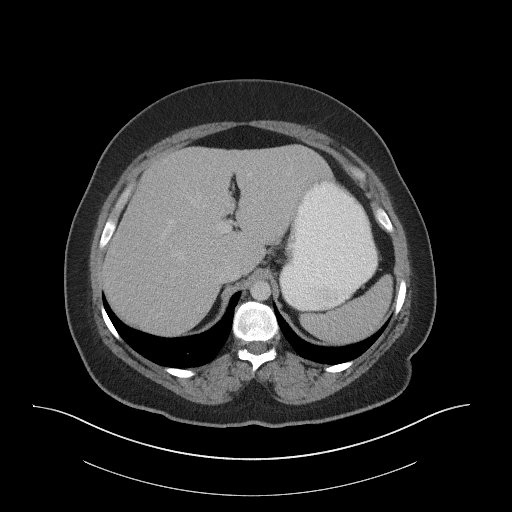
[im 87/100  soft-tissue]
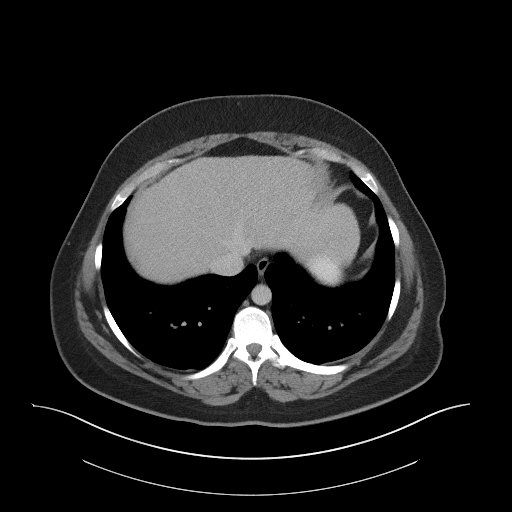
[im 95/100  soft-tissue]
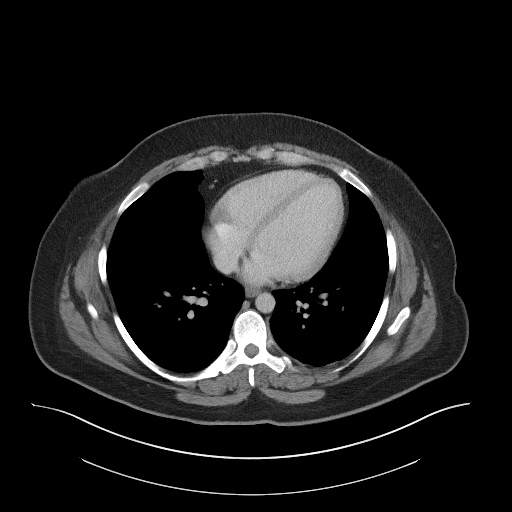

[Series 5: coronal st · coronal · 0.75mm/px · 3 of 107 slices shown]
[im 36/107  soft-tissue]
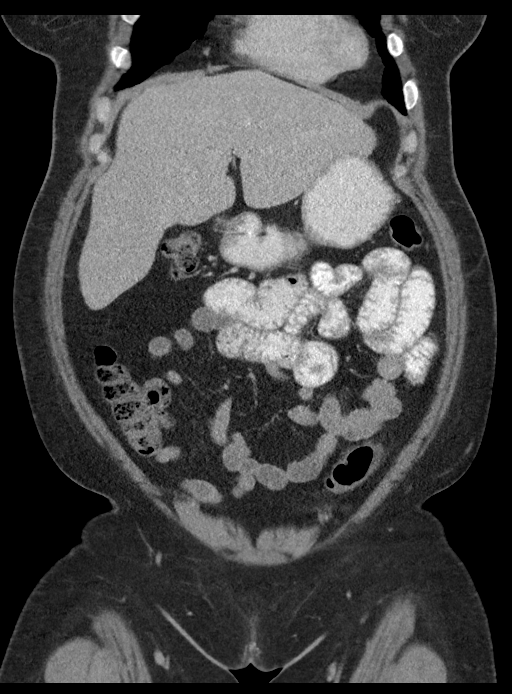
[im 48/107  soft-tissue]
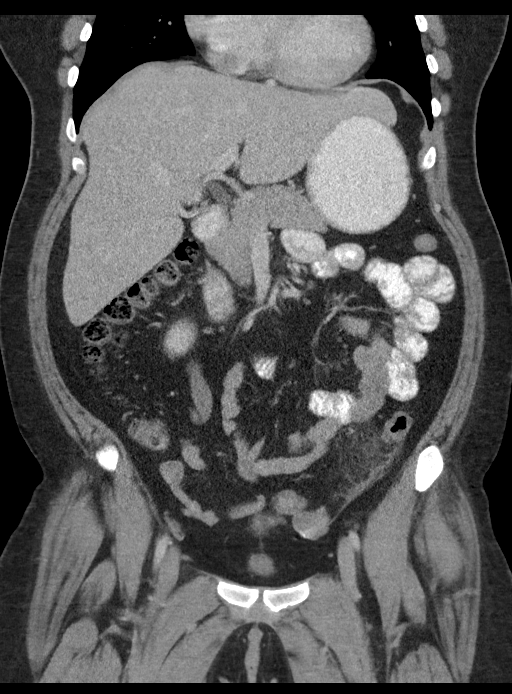
[im 59/107  soft-tissue]
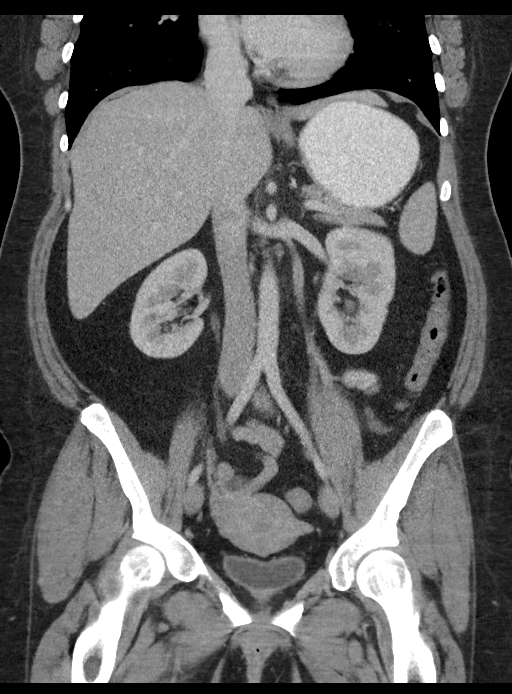

[16 of 46 positions shown; findings below may reference images not displayed]

FINDINGS: Lower chest: No acute abnormality.

Hepatobiliary: Previous cholecystectomy. Liver and biliary tree are
within normal.

Pancreas: Within normal.

Spleen: Within normal.

Adrenals/Urinary Tract: Adrenal glands are normal. Kidneys normal
size without hydronephrosis or nephrolithiasis. Ureters and bladder
are normal.

Stomach/Bowel: Stomach and small bowel are within normal. Appendix
is normal. There is diverticulosis of the colon with an inflamed
diverticulum over the distal descending colon in the left lower
quadrant with adjacent inflammatory change and minimal free fluid.
Findings are compatible of mild acute diverticulitis. No evidence of
perforation or abscess.

Vascular/Lymphatic: Within normal.

Reproductive: Previous bilateral tubal ligation as the uterus and
ovaries otherwise within normal.

Other: No abdominal wall hernia.

Musculoskeletal: Unremarkable.
IMPRESSION: Diverticulosis of the colon with mild acute diverticulitis involving
the distal descending colon in the left lower quadrant. No
perforation or abscess.

## 2018-11-02 IMAGING — CR DG LUMBAR SPINE COMPLETE 4+V
5 series · 5 of 5 positions shown · non-contrast
Comparison: None.

CLINICAL DATA: Pain following fall

EXAM:
LUMBAR SPINE - COMPLETE 4+ VIEW

[l-spine ap]
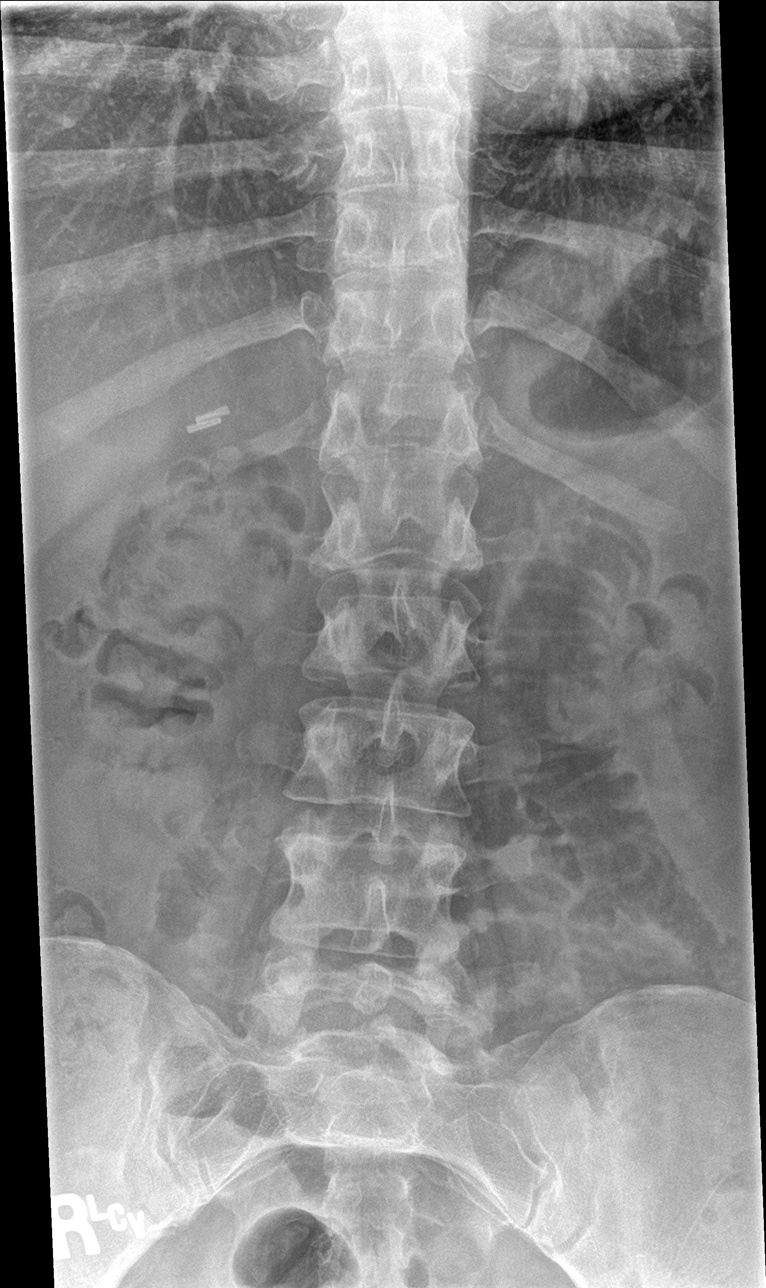

[l-spine obl (1 of 2)]
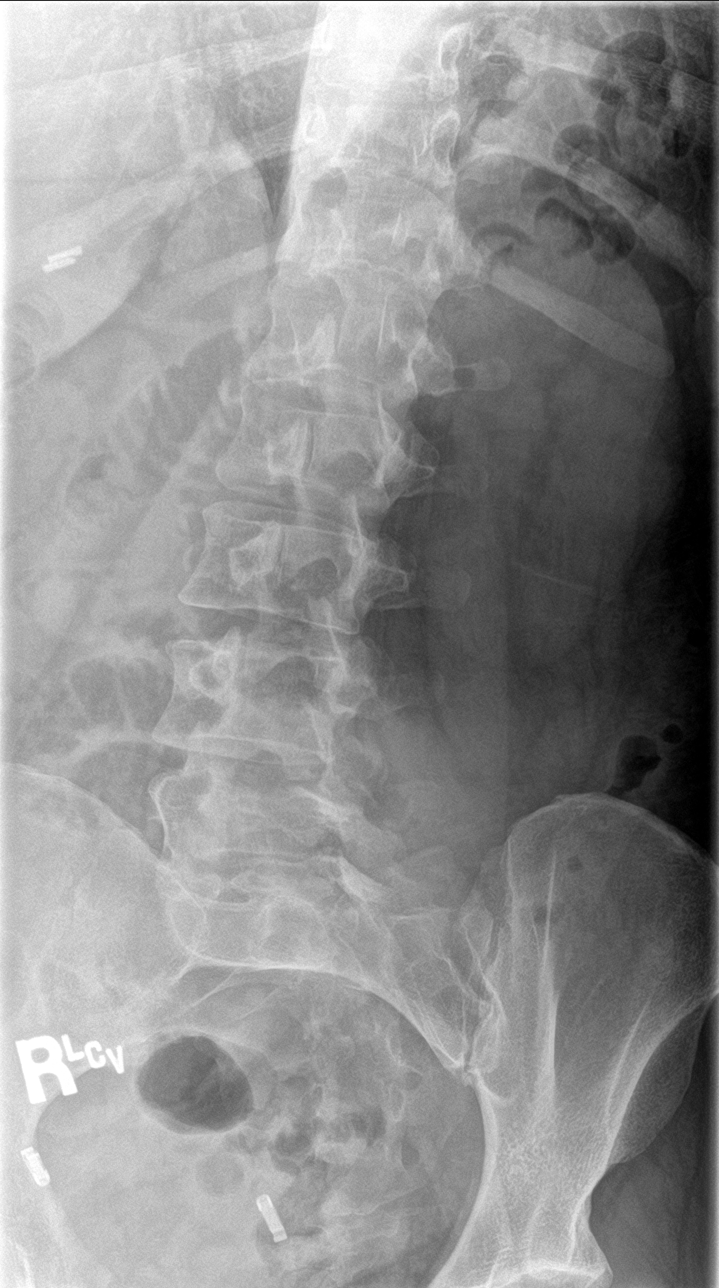

[l-spine obl (2 of 2)]
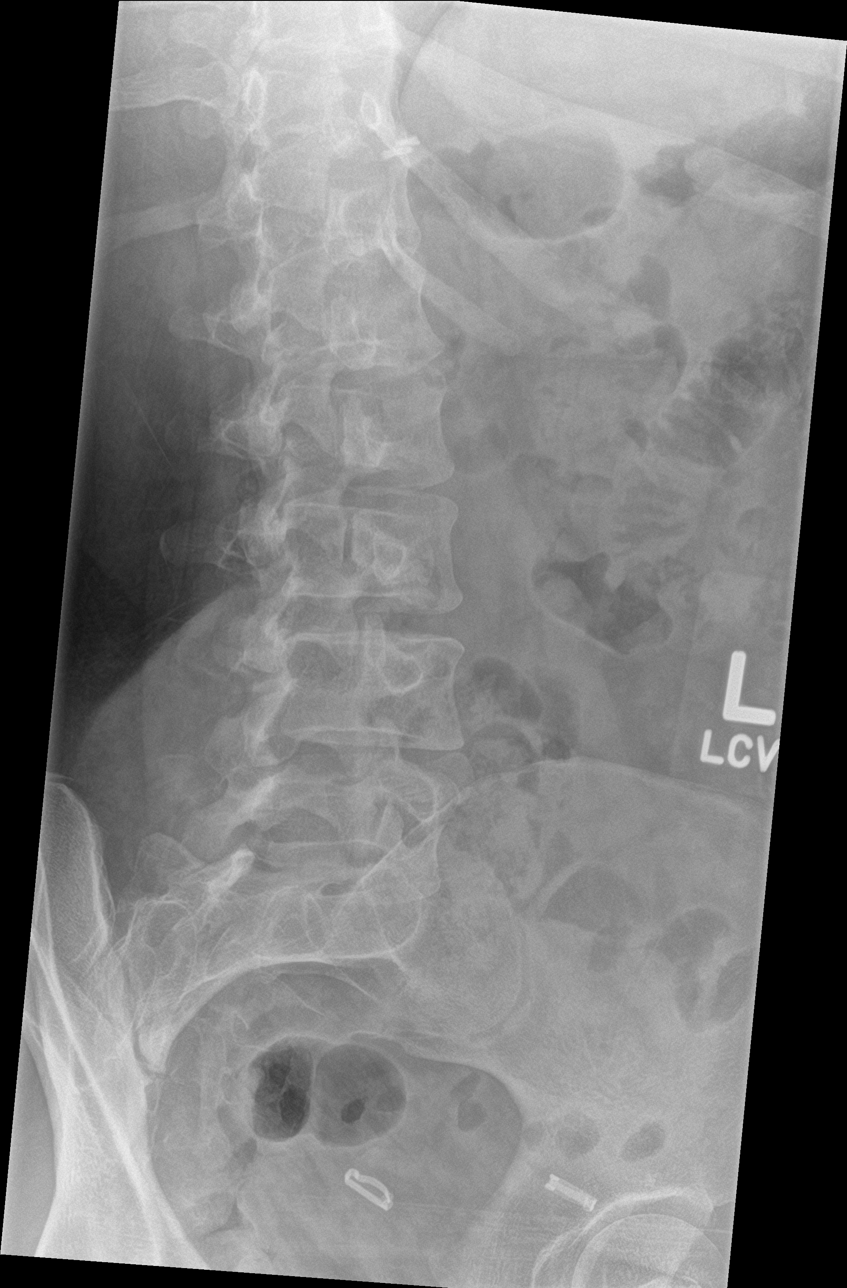

[l-spine lat]
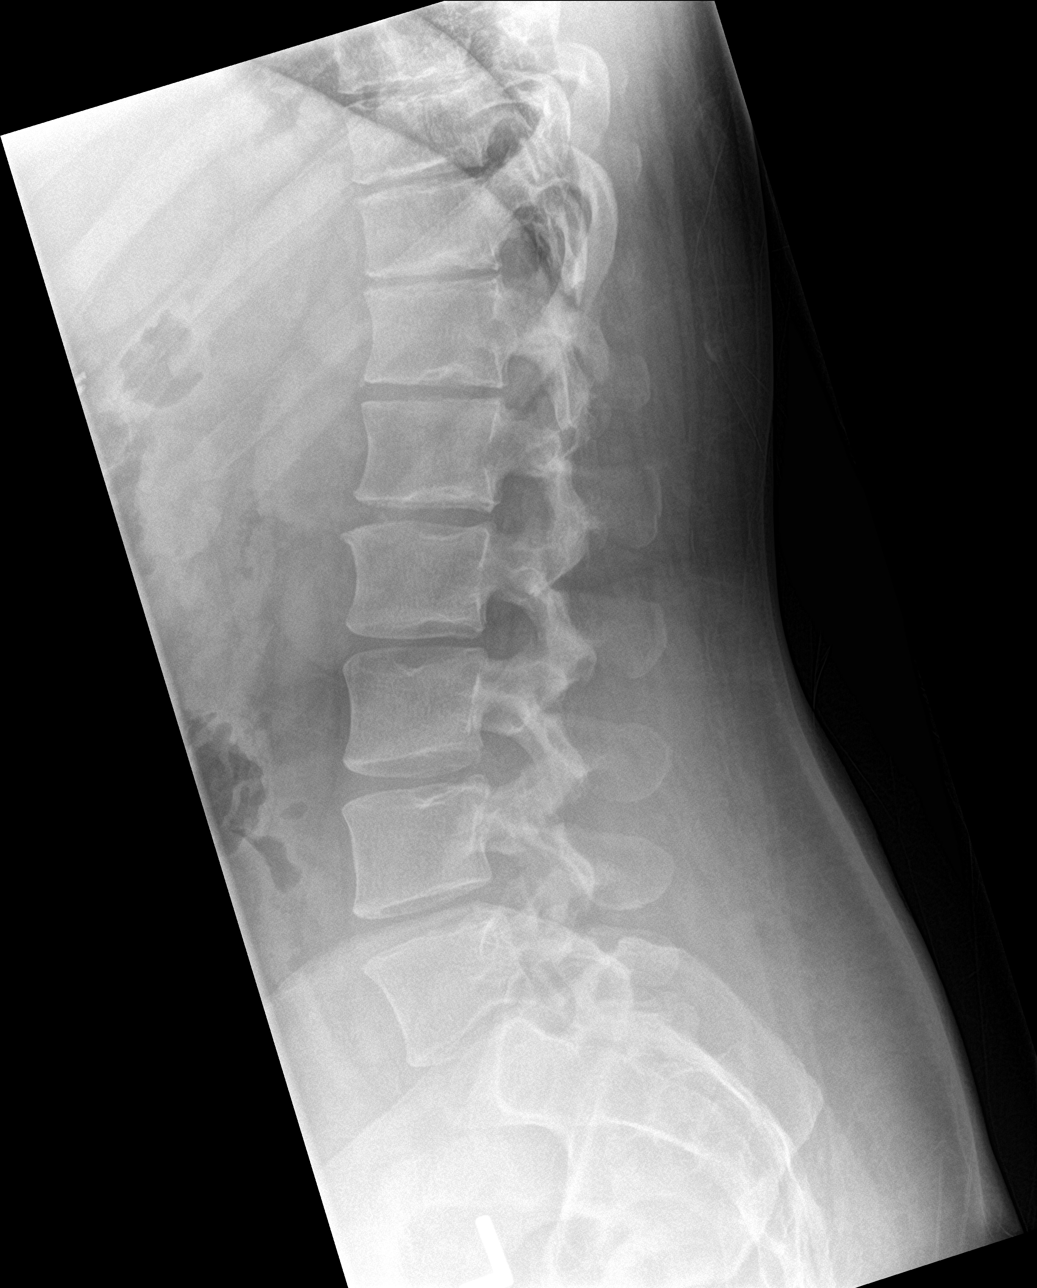

[l-spine spot]
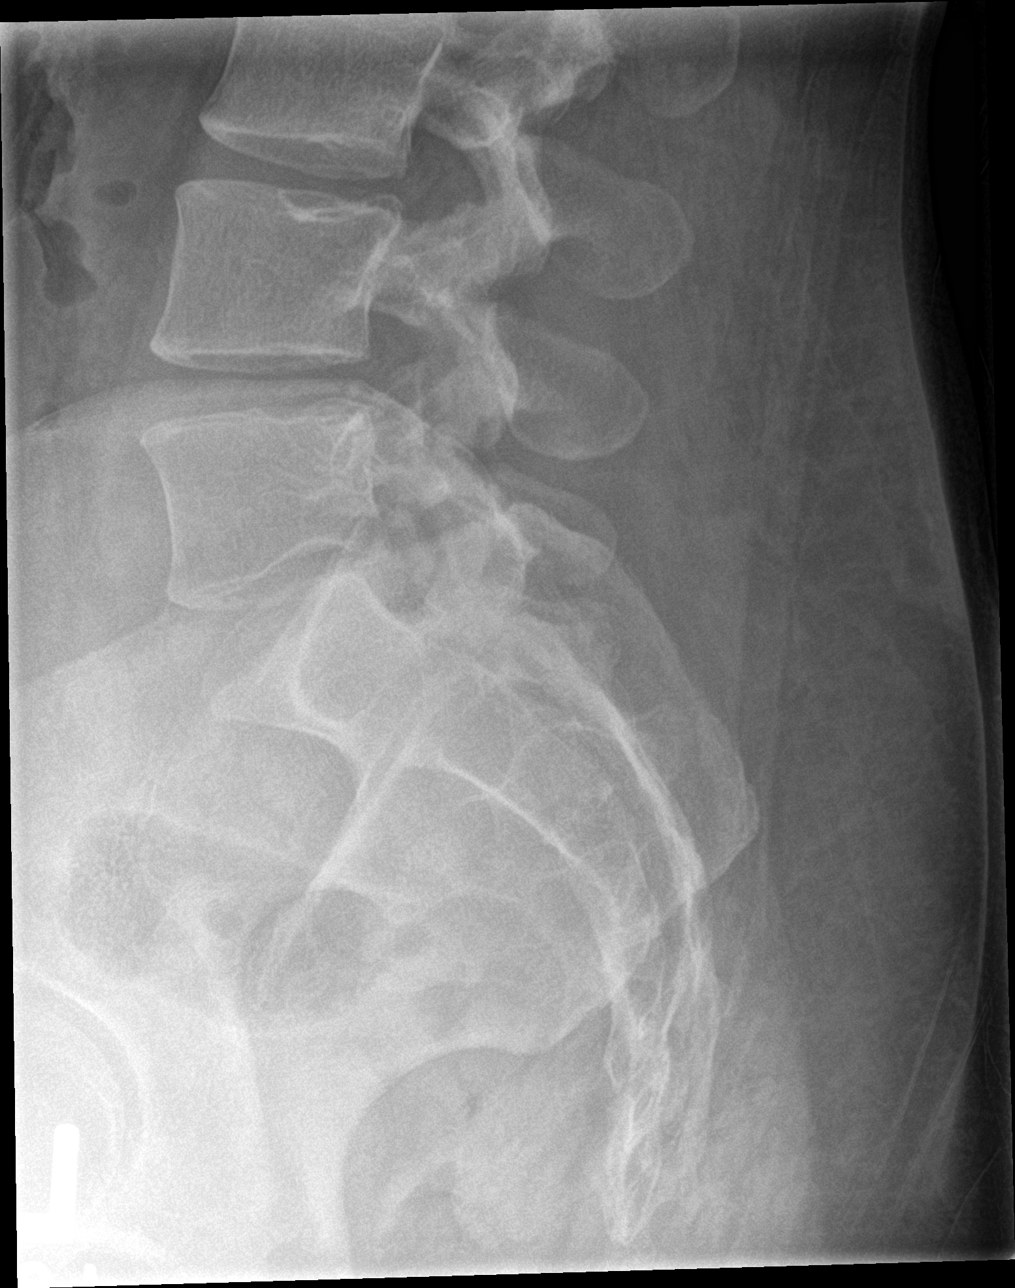

[5 of 5 positions shown; findings below may reference images not displayed]

FINDINGS: Frontal, lateral, spot lumbosacral lateral, and bilateral oblique
views were obtained. There are 5 non-rib-bearing lumbar type
vertebral bodies. There is no fracture. There is 5 mm of
retrolisthesis of L5 on S1. There is no other spondylolisthesis.
There is mild disc space narrowing at L5-S1, moderate. Disc spaces
otherwise appear normal. There is facet osteoarthritic change at
L4-5 bilaterally and at L5-S1 on the right. No erosive change.
IMPRESSION: Osteoarthritic change at L5-S1. Spondylolisthesis at L5-S1 is felt
to be due to underlying spondylosis. No other spondylolisthesis.
There is also facet osteoarthritic change at L4-5 bilaterally. No
fracture.

## 2019-08-03 ENCOUNTER — Other Ambulatory Visit: Payer: Self-pay

## 2019-08-03 ENCOUNTER — Emergency Department
Admission: EM | Admit: 2019-08-03 | Discharge: 2019-08-03 | Disposition: A | Discharge status: Home / Self Care | Payer: Medicaid Other | Attending: Emergency Medicine | Admitting: Emergency Medicine

## 2019-08-03 ENCOUNTER — Encounter: Payer: Self-pay | Admitting: Emergency Medicine

## 2019-08-03 DIAGNOSIS — F17210 Nicotine dependence, cigarettes, uncomplicated: Secondary | ICD-10-CM | POA: Insufficient documentation

## 2019-08-03 DIAGNOSIS — K5792 Diverticulitis of intestine, part unspecified, without perforation or abscess without bleeding: Secondary | ICD-10-CM

## 2019-08-03 DIAGNOSIS — Z79899 Other long term (current) drug therapy: Secondary | ICD-10-CM | POA: Insufficient documentation

## 2019-08-03 LAB — COMPREHENSIVE METABOLIC PANEL
ALT: 31 U/L (ref 0–44)
AST: 17 U/L (ref 15–41)
Albumin: 4.1 g/dL (ref 3.5–5.0)
Alkaline Phosphatase: 43 U/L (ref 38–126)
Anion gap: 8 (ref 5–15)
BUN: 17 mg/dL (ref 6–20)
CO2: 22 mmol/L (ref 22–32)
Calcium: 9 mg/dL (ref 8.9–10.3)
Chloride: 109 mmol/L (ref 98–111)
Creatinine, Ser: 0.51 mg/dL (ref 0.44–1.00)
GFR calc Af Amer: >60 mL/min (ref >60)
GFR calc non Af Amer: >60 mL/min (ref >60)
Glucose, Bld: 106 mg/dL — ABNORMAL HIGH (ref 70–99)
Potassium: 4.5 mmol/L (ref 3.5–5.1)
Sodium: 139 mmol/L (ref 135–145)
Total Bilirubin: 0.6 mg/dL (ref 0.3–1.2)
Total Protein: 7.5 g/dL (ref 6.5–8.1)

## 2019-08-03 LAB — LIPASE, BLOOD: Lipase: 33 U/L (ref 11–51)

## 2019-08-03 LAB — CBC
HCT: 39.6 % (ref 36.0–46.0)
Hemoglobin: 13.7 g/dL (ref 12.0–15.0)
MCH: 30.4 pg (ref 26.0–34.0)
MCHC: 34.6 g/dL (ref 30.0–36.0)
MCV: 87.8 fL (ref 80.0–100.0)
Platelets: 239 10*3/uL (ref 150–400)
RBC: 4.51 MIL/uL (ref 3.87–5.11)
RDW: 12.6 % (ref 11.5–15.5)
WBC: 9.4 10*3/uL (ref 4.0–10.5)
nRBC: 0 % (ref 0.0–0.2)

## 2019-08-03 LAB — URINALYSIS, COMPLETE (UACMP) WITH MICROSCOPIC
Bacteria, UA: NONE SEEN
Bilirubin Urine: NEGATIVE
Glucose, UA: NEGATIVE mg/dL
Hgb urine dipstick: NEGATIVE
Ketones, ur: NEGATIVE mg/dL
Leukocytes,Ua: NEGATIVE
Nitrite: NEGATIVE
Protein, ur: NEGATIVE mg/dL
Specific Gravity, Urine: 1.021 (ref 1.005–1.030)
pH: 5 (ref 5.0–8.0)

## 2019-08-03 LAB — POCT PREGNANCY, URINE: Preg Test, Ur: NEGATIVE

## 2019-08-03 MED ORDER — METRONIDAZOLE 500 MG PO TABS: 500.0000 mg | ORAL_TABLET | Freq: Two times a day (BID) | ORAL | 0 refills | Status: DC

## 2019-08-03 MED ORDER — CIPROFLOXACIN HCL 500 MG PO TABS: 500.0000 mg | ORAL_TABLET | Freq: Two times a day (BID) | ORAL | 0 refills | Status: DC

## 2019-08-03 MED ORDER — CIPROFLOXACIN HCL 500 MG PO TABS
500.0000 mg | ORAL_TABLET | Freq: Once | ORAL | Status: AC
  Administered 2019-08-03: 500 mg via ORAL
  Filled 2019-08-03: qty 1

## 2019-08-03 MED ORDER — OXYCODONE-ACETAMINOPHEN 5-325 MG PO TABS
2.0000 | ORAL_TABLET | Freq: Once | ORAL | Status: AC
Start: 2019-08-03 — End: 2019-08-03
  Administered 2019-08-03: 10:00:00 2 via ORAL
  Filled 2019-08-03: qty 2

## 2019-08-03 MED ORDER — METRONIDAZOLE 500 MG PO TABS
500.0000 mg | ORAL_TABLET | Freq: Once | ORAL | Status: AC
  Administered 2019-08-03: 500 mg via ORAL
  Filled 2019-08-03: qty 1

## 2019-08-03 MED ORDER — OXYCODONE-ACETAMINOPHEN 5-325 MG PO TABS: 1.0000 | ORAL_TABLET | Freq: Four times a day (QID) | ORAL | 0 refills | Status: AC | PRN

## 2019-08-03 NOTE — ED Triage Notes (Signed)
Pt c/o LLQ abdominal pain, constipation and nausea x 2 days. Pt has hx/o diverticulitis. Last BM was Tuesday AM.

## 2019-08-03 NOTE — ED Provider Notes (Signed)
[facility] Emergency Department Provider Note   ____________________________________________    I have reviewed the triage vital signs and the nursing notes.   HISTORY  Chief Complaint Abdominal Pain     HPI Robyn Ortiz is a 33 y.o. female who presents with left lower quadrant abdominal pain x24 hours.  She reports this feels very similar to prior episode of diverticulitis.  She reports the pain has gradually worsened.  She denies vomiting or nausea or fever.  Has not taken anything for this.  Last episode was 2 years ago, reviewed medical records, mild diverticulitis demonstrated on CT at that time.   Past Medical History:  Diagnosis Date  . Diverticulitis   . GERD (gastroesophageal reflux disease)   . History of bleeding ulcers    told not to take ibuprofen  . History of bronchitis 7633yrs ago  . Migraine   . Obesity   . Sleep apnea    CPAP    Patient Active Problem List   Diagnosis Date Noted  . 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 Eczema 09/04/2014  . Abnormal quad screen 03/03/2013  . Obesity (BMI 30-39.9) 11/21/2012  . Asthma 11/21/2012  . Current smoker 11/21/2012  . Condyloma acuminatum of perianal region 11/21/2012  . Migraine 11/21/2012    Past Surgical History:  Procedure Laterality Date  . CESAREAN SECTION N/A 04/08/2015   Procedure: CESAREAN SECTION;  Surgeon: Catalina AntiguaPeggy Constant, MD;  Location: [facility];  Service: Obstetrics;  Laterality: N/A;  . CESAREAN SECTION WITH BILATERAL TUBAL LIGATION  04/08/2015  . CHOLECYSTECTOMY  03-27-14   Dr Lemar LivingsByrnett  . ESOPHAGOGASTRODUODENOSCOPY N/A 05/19/2017   Procedure: ESOPHAGOGASTRODUODENOSCOPY (EGD);  Surgeon: Toney ReilVanga, Rohini Reddy, MD;  [facility];  Service: Endoscopy;  Laterality: N/A;  . TONSILLECTOMY  03/10/2012   Procedure: TONSILLECTOMY;  Surgeon: Suzanna ObeyJohn Byers, MD;  [facility];  Service: ENT;  Laterality: Bilateral;    Prior to Admission medications   Medication  Sig Start Date End Date Taking? Authorizing Provider  acetaminophen (TYLENOL) 325 MG tablet Take 650 mg by mouth every 6 (six) hours as needed for moderate pain.    [provider]  amitriptyline (ELAVIL) 25 MG tablet Take 1 tablet (25 mg total) by mouth at bedtime. 10/06/17 11/05/17  Toney ReilVanga, Rohini Reddy, MD  azithromycin (ZITHROMAX) 250 MG tablet TAKE 2 TABLETS BY MOUTH TODAY, THEN TAKE 1 TABLET DAILY FOR 4 DAYS 01/06/18   [provider]  Baclofen 5 MG TABS Take 1 tablet by mouth every 12 (twelve) hours as needed. 11/03/17   [provider]  cetirizine (ZYRTEC) 10 MG tablet Take 10 mg by mouth daily. 04/04/17   [provider]  cholestyramine Lanetta Inch(QUESTRAN) 4 GM/DOSE powder TAKE 1 SCOOP BY MOUTH TWICE A DAY 01/02/18   [provider]  ciprofloxacin (CIPRO) 500 MG tablet Take 1 tablet (500 mg total) by mouth 2 (two) times daily. 08/03/19   Jene EveryKinner, Dashonda Bonneau, MD  cyclobenzaprine (FLEXERIL) 5 MG tablet Take 1-2 tablets 3 times daily as needed Patient not taking: Reported on 12/08/2017 10/31/17   Enid DerryWagner, Ashley, PA-C  dicyclomine (BENTYL) 10 MG capsule Take 1 capsule (10 mg total) by mouth 4 (four) times daily -  before meals and at bedtime. 10/06/17 11/05/17  Toney ReilVanga, Rohini Reddy, MD  methylPREDNISolone (MEDROL DOSEPAK) 4 MG TBPK tablet TAKE 6 TABLETS ON DAY 1 AS DIRECTED ON PACKAGE AND DECREASE BY 1 TAB EACH DAY FOR A TOTAL OF 6 DAYS 11/29/17   [provider]  metroNIDAZOLE (FLAGYL) 500 MG tablet Take 1 tablet (500 mg total) by mouth 2 (two) times daily after a meal. 08/03/19   Jene Every, MD  oxyCODONE-acetaminophen (PERCOCET) 5-325 MG tablet Take 1 tablet by mouth every 6 (six) hours as needed for severe pain. 08/03/19 08/02/20  Jene Every, MD     Allergies Codeine and Ibuprofen  Family History  Problem Relation Age of Onset  . Diabetes Father   . Bipolar disorder Mother   . Heart disease Paternal Grandmother   . Cancer Paternal Grandfather         intestional cancer  . Other Paternal Grandfather        deteriorating disc    Social History Social History   Tobacco Use  . Smoking status: Current Every Day Smoker    Packs/day: 1.00    Years: 13.00    Pack years: 13.00    Types: Cigarettes  . Smokeless tobacco: Never Used  Substance Use Topics  . Alcohol use: Yes    Comment: beer occasionally  . Drug use: No    Review of Systems  Constitutional: No fever/chills Eyes: No visual changes.  ENT: No sore throat. Cardiovascular: Denies chest pain. Respiratory: Denies shortness of breath. Gastrointestinal: As above Genitourinary: Negative for dysuria. Musculoskeletal: Negative for back pain. Skin: Negative for rash. Neurological: Negative for headaches    ____________________________________________   PHYSICAL EXAM:  VITAL SIGNS: ED Triage Vitals  Enc Vitals Group     BP 08/03/19 0706 122/69     Pulse Rate 08/03/19 0706 89     Resp 08/03/19 0706 16     Temp 08/03/19 0706 98.8 F (37.1 C)     Temp Source 08/03/19 0706 Oral     SpO2 08/03/19 0706 100 %     Weight 08/03/19 0706 97.5 kg (215 lb)     Height 08/03/19 0706 1.651 m (5\' 5" )     Head Circumference --      Peak Flow --      Pain Score 08/03/19 0944 10     Pain Loc --      Pain Edu? --      Excl. in GC? --     Constitutional: Alert and oriented.   Nose: No congestion/rhinnorhea. Mouth/Throat: Mucous membranes are moist.    Cardiovascular: Normal rate, regular rhythm. Grossly normal heart sounds.  Good peripheral circulation. Respiratory: Normal respiratory effort.  No retractions. Lungs CTAB. Gastrointestinal: Soft, tender to palpation in the left lower quadrant, no distention, no CVA tenderness  Musculoskeletal:   Warm and well perfused Neurologic:  Normal speech and language. No gross focal neurologic deficits are appreciated.  Skin:  Skin is warm, dry and intact. No rash noted. Psychiatric: Mood and affect are normal. Speech and behavior  are normal.  ____________________________________________   LABS (all labs ordered are listed, but only abnormal results are displayed)  Labs Reviewed  COMPREHENSIVE METABOLIC PANEL - Abnormal; Notable for the following components:      Result Value   Glucose, Bld 106 (*)    All other components within normal limits  URINALYSIS, COMPLETE (UACMP) WITH MICROSCOPIC - Abnormal; Notable for the following components:   Color, Urine YELLOW (*)    APPearance CLEAR (*)    All other components within normal limits  LIPASE, BLOOD  CBC  POC URINE PREG, ED  POCT PREGNANCY, URINE   ____________________________________________  EKG   ____________________________________________  RADIOLOGY   ____________________________________________   PROCEDURES  Procedure(s) performed: No  Procedures  Critical Care performed: No ____________________________________________   INITIAL IMPRESSION / ASSESSMENT AND PLAN / ED COURSE  Pertinent labs & imaging results that were available during my care of the patient were reviewed by me and considered in my medical decision making (see chart for details).  Patient overall well-appearing in no acute distress, lab work is quite reassuring, normal white blood cell count, afebrile, normal vitals, mild tenderness left lower quadrant consistent with diverticulitis given her history.  Will start on Flagyl, Cipro, analgesics, return precautions discussed    ____________________________________________   FINAL CLINICAL IMPRESSION(S) / ED DIAGNOSES  Final diagnoses:  Diverticulitis        Note:  This document was prepared using Dragon voice recognition software and may include unintentional dictation errors.   Lavonia Drafts, MD 08/03/19 1113

## 2019-08-03 NOTE — ED Notes (Signed)
ED Provider at bedside. 

## 2021-01-16 ENCOUNTER — Other Ambulatory Visit: Payer: Self-pay

## 2021-01-16 ENCOUNTER — Encounter (HOSPITAL_BASED_OUTPATIENT_CLINIC_OR_DEPARTMENT_OTHER): Payer: Self-pay | Admitting: *Deleted

## 2021-01-16 ENCOUNTER — Emergency Department (HOSPITAL_BASED_OUTPATIENT_CLINIC_OR_DEPARTMENT_OTHER): Payer: Medicaid Other

## 2021-01-16 ENCOUNTER — Other Ambulatory Visit (HOSPITAL_BASED_OUTPATIENT_CLINIC_OR_DEPARTMENT_OTHER): Payer: Self-pay

## 2021-01-16 ENCOUNTER — Emergency Department (HOSPITAL_BASED_OUTPATIENT_CLINIC_OR_DEPARTMENT_OTHER)
Admission: EM | Admit: 2021-01-16 | Discharge: 2021-01-16 | Disposition: A | Discharge status: Home / Self Care | Payer: Medicaid Other | Attending: Emergency Medicine | Admitting: Emergency Medicine

## 2021-01-16 DIAGNOSIS — K529 Noninfective gastroenteritis and colitis, unspecified: Secondary | ICD-10-CM | POA: Insufficient documentation

## 2021-01-16 DIAGNOSIS — R110 Nausea: Secondary | ICD-10-CM | POA: Insufficient documentation

## 2021-01-16 DIAGNOSIS — F17210 Nicotine dependence, cigarettes, uncomplicated: Secondary | ICD-10-CM | POA: Insufficient documentation

## 2021-01-16 DIAGNOSIS — J45909 Unspecified asthma, uncomplicated: Secondary | ICD-10-CM | POA: Diagnosis not present

## 2021-01-16 DIAGNOSIS — R519 Headache, unspecified: Secondary | ICD-10-CM | POA: Diagnosis not present

## 2021-01-16 DIAGNOSIS — R1084 Generalized abdominal pain: Secondary | ICD-10-CM | POA: Diagnosis not present

## 2021-01-16 DIAGNOSIS — M545 Low back pain, unspecified: Secondary | ICD-10-CM | POA: Insufficient documentation

## 2021-01-16 LAB — COMPREHENSIVE METABOLIC PANEL
ALT: 40 U/L (ref 0–44)
AST: 20 U/L (ref 15–41)
Albumin: 4.3 g/dL (ref 3.5–5.0)
Alkaline Phosphatase: 48 U/L (ref 38–126)
Anion gap: 9 (ref 5–15)
BUN: 14 mg/dL (ref 6–20)
CO2: 25 mmol/L (ref 22–32)
Calcium: 9.2 mg/dL (ref 8.9–10.3)
Chloride: 102 mmol/L (ref 98–111)
Creatinine, Ser: 0.57 mg/dL (ref 0.44–1.00)
GFR, Estimated: >60 mL/min (ref >60)
Glucose, Bld: 81 mg/dL (ref 70–99)
Potassium: 3.9 mmol/L (ref 3.5–5.1)
Sodium: 136 mmol/L (ref 135–145)
Total Bilirubin: 0.4 mg/dL (ref 0.3–1.2)
Total Protein: 7.9 g/dL (ref 6.5–8.1)

## 2021-01-16 LAB — URINALYSIS, ROUTINE W REFLEX MICROSCOPIC
Bilirubin Urine: NEGATIVE
Glucose, UA: NEGATIVE mg/dL
Hgb urine dipstick: NEGATIVE
Ketones, ur: NEGATIVE mg/dL
Leukocytes,Ua: NEGATIVE
Nitrite: NEGATIVE
Protein, ur: NEGATIVE mg/dL
Specific Gravity, Urine: 1.02 (ref 1.005–1.030)
pH: 5.5 (ref 5.0–8.0)

## 2021-01-16 LAB — CBC WITH DIFFERENTIAL/PLATELET
Abs Immature Granulocytes: 0.01 10*3/uL (ref 0.00–0.07)
Basophils Absolute: 0.1 10*3/uL (ref 0.0–0.1)
Basophils Relative: 1 %
Eosinophils Absolute: 0.4 10*3/uL (ref 0.0–0.5)
Eosinophils Relative: 6 %
HCT: 43.3 % (ref 36.0–46.0)
Hemoglobin: 14.6 g/dL (ref 12.0–15.0)
Immature Granulocytes: 0 %
Lymphocytes Relative: 35 %
Lymphs Abs: 2.2 10*3/uL (ref 0.7–4.0)
MCH: 30.7 pg (ref 26.0–34.0)
MCHC: 33.7 g/dL (ref 30.0–36.0)
MCV: 91 fL (ref 80.0–100.0)
Monocytes Absolute: 0.4 10*3/uL (ref 0.1–1.0)
Monocytes Relative: 7 %
Neutro Abs: 3.2 10*3/uL (ref 1.7–7.7)
Neutrophils Relative %: 51 %
Platelets: 274 10*3/uL (ref 150–400)
RBC: 4.76 MIL/uL (ref 3.87–5.11)
RDW: 12.2 % (ref 11.5–15.5)
WBC: 6.2 10*3/uL (ref 4.0–10.5)
nRBC: 0 % (ref 0.0–0.2)

## 2021-01-16 LAB — PREGNANCY, URINE: Preg Test, Ur: NEGATIVE

## 2021-01-16 MED ORDER — CYCLOBENZAPRINE HCL 10 MG PO TABS: 10.0000 mg | ORAL_TABLET | Freq: Two times a day (BID) | ORAL | 0 refills | Status: DC | PRN

## 2021-01-16 MED ORDER — CYCLOBENZAPRINE HCL 10 MG PO TABS
ORAL_TABLET | ORAL | 0 refills | Status: DC
  Filled 2021-01-16: qty 20, 10d supply, fill #0

## 2021-01-16 NOTE — Discharge Instructions (Addendum)
Treat your pain with Tylenol, and ice or heat. You can take the Flexeril every 12 hours as needed for muscle spasm.  Be aware this medication can make you drowsy. Follow with your primary care provider regarding your ongoing symptoms.

## 2021-01-16 NOTE — ED Provider Notes (Signed)
[facility] EMERGENCY DEPARTMENT Provider Note   CSN: [id number] Arrival date & time: 01/16/21  1343     History Chief Complaint  Patient presents with  . Back Pain    Robyn Ortiz is a 35 y.o. female past medical history of diverticulitis, IBS, gastric ulcers, migraine headache.  She is presenting to the ED for evaluation of progressively worsening left flank and left low back pain over the last month.  Pain is progressively worsening, described as sharp pains that radiate around to her left abdomen, worse when she lays down.  She is having diarrhea though has chronic diarrhea due to IBS.  She is concerned for recurrence of diverticulitis  She is having some intermittent nausea without vomiting.  No dysuria, urinary frequency, hematuria.  States her urine was cloudy few days ago.  No fevers or chills.  She is having migraine headaches, history of the same.  She is treating her symptoms with Tylenol and ibuprofen though she is aware she is should not be taking ibuprofen due to her history of ulcers.  The history is provided by the patient.       Past Medical History:  Diagnosis Date  . Diverticulitis   . GERD (gastroesophageal reflux disease)   . History of bleeding ulcers    told not to take ibuprofen  . History of bronchitis 26yrs ago  . Migraine   . Obesity   . Sleep apnea    CPAP    Patient Active Problem List   Diagnosis Date Noted  . 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 Eczema 09/04/2014  . Abnormal quad screen 03/03/2013  . Obesity (BMI 30-39.9) 11/21/2012  . Asthma 11/21/2012  . Current smoker 11/21/2012  . Condyloma acuminatum of perianal region 11/21/2012  . Migraine 11/21/2012    Past Surgical History:  Procedure Laterality Date  . CESAREAN SECTION N/A 04/08/2015   Procedure: CESAREAN SECTION;  Surgeon: Catalina Antigua, MD;  Location: [facility];  Service: Obstetrics;  Laterality: N/A;  . CESAREAN SECTION WITH BILATERAL TUBAL LIGATION  04/08/2015  .  CHOLECYSTECTOMY  03-27-14   Dr Lemar Livings  . ESOPHAGOGASTRODUODENOSCOPY N/A 05/19/2017   Procedure: ESOPHAGOGASTRODUODENOSCOPY (EGD);  Surgeon: Toney Reil, MD;  Location: [facility];  Service: Endoscopy;  Laterality: N/A;  . TONSILLECTOMY  03/10/2012   Procedure: TONSILLECTOMY;  Surgeon: Suzanna Obey, MD;  Location: [facility] OR;  Service: ENT;  Laterality: Bilateral;     OB History    Gravida  3   Para  3   Term  3   Preterm      AB      Living  3     SAB      IAB      Ectopic      Multiple  0   Live Births  3        Obstetric Comments  1st Menstrual Cycle:  14  1st Pregnancy:  17        Family History  Problem Relation Age of Onset  . Diabetes Father   . Bipolar disorder Mother   . Heart disease Paternal Grandmother   . Cancer Paternal Grandfather        intestional cancer  . Other Paternal Grandfather        deteriorating disc    Social History   Tobacco Use  . Smoking status: Current Every Day Smoker    Packs/day: 1.00    Years: 13.00    Pack years: 13.00  Types: Cigarettes  . Smokeless tobacco: Never Used  Vaping Use  . Vaping Use: Never used  Substance Use Topics  . Alcohol use: Yes    Comment: beer occasionally  . Drug use: No    Home Medications Prior to Admission medications   Medication Sig Start Date End Date Taking? Authorizing Provider  acetaminophen (TYLENOL) 325 MG tablet Take 650 mg by mouth every 6 (six) hours as needed for moderate pain.    [provider]  amitriptyline (ELAVIL) 25 MG tablet Take 1 tablet (25 mg total) by mouth at bedtime. 10/06/17 11/05/17  Toney Reil, MD  azithromycin (ZITHROMAX) 250 MG tablet TAKE 2 TABLETS BY MOUTH TODAY, THEN TAKE 1 TABLET DAILY FOR 4 DAYS 01/06/18   [provider]  Baclofen 5 MG TABS Take 1 tablet by mouth every 12 (twelve) hours as needed. 11/03/17   [provider]  cetirizine (ZYRTEC) 10 MG tablet Take 10 mg by mouth daily. 04/04/17   [provider]  cholestyramine Lanetta Inch) 4 GM/DOSE powder TAKE 1 SCOOP BY MOUTH TWICE A DAY 01/02/18   [provider]  ciprofloxacin (CIPRO) 500 MG tablet Take 1 tablet (500 mg total) by mouth 2 (two) times daily. 08/03/19   Jene Every, MD  cyclobenzaprine (FLEXERIL) 10 MG tablet Take 1 tablet (10 mg total) by mouth 2 (two) times daily as needed for muscle spasms. 01/16/21   Mancel Lardizabal, [place] N, PA-C  dicyclomine (BENTYL) 10 MG capsule Take 1 capsule (10 mg total) by mouth 4 (four) times daily -  before meals and at bedtime. 10/06/17 11/05/17  Toney Reil, MD  methylPREDNISolone (MEDROL DOSEPAK) 4 MG TBPK tablet TAKE 6 TABLETS ON DAY 1 AS DIRECTED ON PACKAGE AND DECREASE BY 1 TAB EACH DAY FOR A TOTAL OF 6 DAYS 11/29/17   [provider]  metroNIDAZOLE (FLAGYL) 500 MG tablet Take 1 tablet (500 mg total) by mouth 2 (two) times daily after a meal. 08/03/19   Jene Every, MD    Allergies    Codeine and Ibuprofen  Review of Systems   Review of Systems  Constitutional: Negative for fever.  Gastrointestinal: Positive for abdominal pain, diarrhea and nausea.  Genitourinary: Positive for flank pain.  Musculoskeletal: Positive for back pain.  Neurological: Positive for headaches.  All other systems reviewed and are negative.   Physical Exam Updated Vital Signs BP 112/81 (BP Location: Right Arm)   Pulse 74   Temp 97.8 F (36.6 C) (Oral)   Resp 16   Ht 5\' 5"  (1.651 m)   Wt 97.5 kg   LMP 01/09/2021   SpO2 97%   BMI 35.78 kg/m   Physical Exam Vitals and nursing note reviewed.  Constitutional:      General: She is not in acute distress.    Appearance: She is well-developed. She is not ill-appearing.  HENT:     Head: Normocephalic and atraumatic.  Eyes:     Conjunctiva/sclera: Conjunctivae normal.  Cardiovascular:     Rate and Rhythm: Normal rate and regular rhythm.  Pulmonary:     Effort: Pulmonary effort is normal. No respiratory distress.     Breath  sounds: Normal breath sounds.  Abdominal:     General: Bowel sounds are normal.     Palpations: Abdomen is soft.     Tenderness: There is generalized abdominal tenderness. There is left CVA tenderness. There is no guarding or rebound.  Skin:    General: Skin is warm.  Neurological:  Mental Status: She is alert.  Psychiatric:        Behavior: Behavior normal.     ED Results / Procedures / Treatments   Labs (all labs ordered are listed, but only abnormal results are displayed) Labs Reviewed  URINALYSIS, ROUTINE W REFLEX MICROSCOPIC  PREGNANCY, URINE  COMPREHENSIVE METABOLIC PANEL  CBC WITH DIFFERENTIAL/PLATELET    EKG None  Radiology CT Abdomen Pelvis Wo Contrast  Result Date: 01/16/2021 CLINICAL DATA:  Left flank pain. EXAM: CT ABDOMEN AND PELVIS WITHOUT CONTRAST TECHNIQUE: Multidetector CT imaging of the abdomen and pelvis was performed following the standard protocol without IV contrast. COMPARISON:  May 07, 2017. FINDINGS: Lower chest: No acute abnormality. Hepatobiliary: No focal liver abnormality is seen. Status post cholecystectomy. No biliary dilatation. Pancreas: Unremarkable. No pancreatic ductal dilatation or surrounding inflammatory changes. Spleen: Normal in size without focal abnormality. Adrenals/Urinary Tract: Adrenal glands are unremarkable. Kidneys are normal, without renal calculi, focal lesion, or hydronephrosis. Bladder is unremarkable. Stomach/Bowel: Stomach is within normal limits. Appendix appears normal. No evidence of bowel wall thickening, distention, or inflammatory changes. Vascular/Lymphatic: No significant vascular findings are present. No enlarged abdominal or pelvic lymph nodes. Reproductive: Uterus and bilateral adnexa are unremarkable. Continued presence of bilateral tubal ligation clips. Other: No abdominal wall hernia or abnormality. No abdominopelvic ascites. Musculoskeletal: No acute or significant osseous findings. IMPRESSION: No acute  abnormality seen in the abdomen or pelvis. Electronically Signed   By: Lupita Raider M.D.   On: 01/16/2021 15:08    Procedures Procedures   Medications Ordered in ED Medications - No data to display  ED Course  I have reviewed the triage vital signs and the nursing notes.  Pertinent labs & imaging results that were available during my care of the patient were reviewed by me and considered in my medical decision making (see chart for details).    MDM Rules/Calculators/A&P                          Patient with history of GERD, IBS, diverticulitis, presenting for left flank, low back, left abdominal pain, gradually worsening over the last month.  Reports chronic diarrhea due to IBS.  Has tenderness to the left CVA and low back.  She also is generally tender to the abdomen without guarding or rebound.  Vital signs are normal.  She is afebrile.  She is well-appearing.  UA obtained in triage is negative.  Urine pregnant is negative.  Consideration of developing diverticulitis, CT scan was obtained as well as blood work.  Blood work is unremarkable.  CT scan is also negative.  Suspect presentation is likely due to musculoskeletal pain.  Will prescribe muscle relaxer, strongly encourage she follow with PCP.  Patient discharged in no distress  Discussed results, findings, treatment and follow up. Patient advised of return precautions. Patient verbalized understanding and agreed with plan.  Final Clinical Impression(s) / ED Diagnoses Final diagnoses:  Acute left-sided low back pain without sciatica    Rx / DC Orders ED Discharge Orders         Ordered    cyclobenzaprine (FLEXERIL) 10 MG tablet  2 times daily PRN        01/16/21 1530           Keymon Mcelroy, [place] N, [place] 01/16/21 1531    Tegeler, Canary Brim, MD 01/16/21 [phone]

## 2021-01-16 NOTE — ED Triage Notes (Signed)
C/o left lower back and flank pain x 1 month

## 2021-03-07 ENCOUNTER — Ambulatory Visit: Payer: Medicaid Other | Attending: Orthopedic Surgery

## 2021-03-07 ENCOUNTER — Other Ambulatory Visit: Payer: Self-pay

## 2021-03-07 DIAGNOSIS — G8929 Other chronic pain: Secondary | ICD-10-CM | POA: Insufficient documentation

## 2021-03-07 DIAGNOSIS — M25561 Pain in right knee: Secondary | ICD-10-CM | POA: Diagnosis present

## 2021-03-07 DIAGNOSIS — R2689 Other abnormalities of gait and mobility: Secondary | ICD-10-CM | POA: Diagnosis present

## 2021-03-07 DIAGNOSIS — R262 Difficulty in walking, not elsewhere classified: Secondary | ICD-10-CM | POA: Diagnosis present

## 2021-03-07 DIAGNOSIS — M6281 Muscle weakness (generalized): Secondary | ICD-10-CM | POA: Diagnosis present

## 2021-03-07 DIAGNOSIS — M2241 Chondromalacia patellae, right knee: Secondary | ICD-10-CM | POA: Insufficient documentation

## 2021-03-08 NOTE — Therapy (Signed)
[facility] Outpatient Rehabilitation [facility] [address] [place], [place], [address] Phone: [phone]   Fax:  [phone]  Physical Therapy Evaluation  Patient Details  Name: Robyn Ortiz MRN: [id number] Date of Birth: 1986-02-04 Referring Provider (PT): Frederico Hamman, MD   Encounter Date: 03/07/2021   PT End of Session - 03/08/21 0653     Visit Number 1    Number of Visits 13    Date for PT Re-Evaluation 04/26/21    Authorization Type [place] MEDICAID [employer]    Progress Note Due on Visit 10    PT Start Time 1105    PT Stop Time 1146    PT Time Calculation (min) 41 min    Activity Tolerance Patient tolerated treatment well    Behavior During Therapy [facility] for tasks assessed/performed             Past Medical History:  Diagnosis Date   Diverticulitis    GERD (gastroesophageal reflux disease)    History of bleeding ulcers    told not to take ibuprofen   History of bronchitis 26yrs ago   Migraine    Obesity    Sleep apnea    CPAP    Past Surgical History:  Procedure Laterality Date   CESAREAN SECTION N/A 04/08/2015   Procedure: CESAREAN SECTION;  Surgeon: Catalina Antigua, MD;  Location: [facility];  Service: Obstetrics;  Laterality: N/A;   CESAREAN SECTION WITH BILATERAL TUBAL LIGATION  04/08/2015   CHOLECYSTECTOMY  03-27-14   Dr Lemar Livings   ESOPHAGOGASTRODUODENOSCOPY N/A 05/19/2017   Procedure: ESOPHAGOGASTRODUODENOSCOPY (EGD);  Surgeon: Toney Reil, MD;  Location: [facility];  Service: Endoscopy;  Laterality: N/A;   TONSILLECTOMY  03/10/2012   Procedure: TONSILLECTOMY;  Surgeon: Suzanna Obey, MD;  Location: [facility] OR;  Service: ENT;  Laterality: Bilateral;    There were no vitals filed for this visit.    Subjective Assessment - 03/07/21 1114     Subjective Pt reports a progressive problem with her R knee for 8 years with it locking up, grinding. Pt notes being told she may need surgery, She states she had an injection in the knee  yesterday, but it is not feeling better yet. The less I'm on my feet, the les pain and swelling I have.    Pertinent History High BMI    Limitations Standing;Walking    How long can you stand comfortably? 30 mins    How long can you walk comfortably? 30 mins    Diagnostic tests Pt reports a MRI is to be scheduled    Patient Stated Goals To fix the issue with her R knee    Currently in Pain? Yes    Pain Score 5    range 3-9/10   Pain Location Knee    Pain Orientation Right    Pain Descriptors / Indicators Aching;Sharp    Pain Type Chronic pain    Pain Onset More than a month ago    Pain Frequency Constant    Aggravating Factors  standing, walking, steps    Pain Relieving Factors ibuprofen, tylenol, ice pack, rest                [facility] PT Assessment - 03/08/21 0001       Assessment   Medical Diagnosis Rt Knee chodromalcia patella    Referring Provider (PT) Frederico Hamman, MD    Hand Dominance Right    Next MD Visit 2 weeks      Precautions   Precautions None  Restrictions   Weight Bearing Restrictions No      Balance Screen   Has the patient fallen in the past 6 months No      Home Environment   Living Environment Private residence    Living Arrangements Spouse/significant other;Children    Type of Home House    Home Access Stairs to enter    Entrance Stairs-Number of Steps 5    Entrance Stairs-Rails Can reach both    Home Layout One level      Prior Function   Level of Independence Independent    Vocation Full time employment    Garment/textile technologist for [employer]. Alot walking, steps, up to 50lbs (2 to 3 times a week)      Cognition   Overall Cognitive Status Within Functional Limits for tasks assessed      Observation/Other Assessments   Observations Atrophy R thigh    Focus on Therapeutic Outcomes (FOTO)  NA-MCD      Observation/Other Assessments-Edema    Edema --   Swelling R knee and calf     Sensation   Light Touch Appears  Intact      ROM / Strength   AROM / PROM / Strength AROM;Strength      AROM   Overall AROM Comments R knee AROM of R knee is WNLs and equal to the L. Crepitus with flexion and extension      Strength   Strength Assessment Site Knee;Hip    Right/Left Hip Right;Left    Right Hip Flexion 4+/5    Right Hip Extension 4+/5    Right Hip External Rotation  4+/5    Right Hip Internal Rotation 4+/5    Right Hip ABduction 4+/5    Right Hip ADduction 4+/5    Left Hip Flexion 5/5    Left Hip Extension 5/5    Left Hip External Rotation 5/5    Left Hip Internal Rotation 5/5    Left Hip ABduction 5/5    Left Hip ADduction 5/5    Right/Left Knee Right;Left    Right Knee Flexion 5/5    Right Knee Extension 5/5    Left Knee Flexion 4+/5    Left Knee Extension 4+/5      Flexibility   Soft Tissue Assessment /Muscle Length yes    Hamstrings R increased tightness vs. L      Palpation   Patella mobility WNLs all direction      Special Tests    Special Tests Knee Special Tests;Laxity/Instability Tests;Meniscus Tests    Laxity/Instability  --   Anterior and posterior draw, medial and lateral knee stability testing were negative   Knee Special tests  Patellofemoral Apprehension Test;Patellofemoral Grind Test (Clarke's Sign)   medial aspect of the patella     Patellofemoral Apprehension Test    Findings Positive    Side  Right      Patellofemoral Grind test (Clark's Sign)   Findings Postive    Side  Right    Comments with medial pressure      McMurray Test   Findings Negative    Side Right      Transfers   Transfers Sit to Stand;Stand to Sit    Sit to Stand 7: Independent      Ambulation/Gait   Ambulation/Gait Yes    Ambulation/Gait Assistance 7: Independent    Gait Pattern Step-through pattern;Antalgic   R LE  Objective measurements completed on examination: See above findings.       [facility] Adult PT Treatment/Exercise - 03/08/21 0001        Exercises   Exercises Knee/Hip      Knee/Hip Exercises: Stretches   Active Hamstring Stretch Right;2 reps;20 seconds      Knee/Hip Exercises: Supine   Straight Leg Raises Right;10 reps    Straight Leg Raises Limitations c quad set prio to SLR      Knee/Hip Exercises: Sidelying   Hip ABduction Right;10 reps    Hip ADduction Right;10 reps      Knee/Hip Exercises: Prone   Hip Extension Right;10 reps                    PT Education - 03/08/21 6599     Education Details Eval findings, POC, HEP, instructions to asd/dsc steps to minimize R knee use, RICE for symptom management    Person(s) Educated Patient    Methods Explanation;Demonstration;Tactile cues;Verbal cues;Handout    Comprehension Verbalized understanding;Returned demonstration;Verbal cues required;Tactile cues required              PT Short Term Goals - 03/08/21 1659       PT SHORT TERM GOAL #1   Title Pt will be Ind in an initial HEP    Baseline Stare on eval    Status New    Target Date 03/29/21      PT SHORT TERM GOAL #2   Title Pt will report understanding of measures to assist in the reduction of R knee pain.    Status New    Target Date 03/29/21               PT Long Term Goals - 03/08/21 1704       PT LONG TERM GOAL #1   Title Pt's R hip and lnee strength will increase to 5/5 to improve the functional use of the R LE    Baseline 4+5/    Status New    Target Date 04/26/21      PT LONG TERM GOAL #2   Title Pt will report a decrease in R knee pain to 4/10 or less with daily and work related activites    Baseline 3-8/10    Status New    Target Date 04/26/21      PT LONG TERM GOAL #3   Title pt will be able to walk without and antalgic gait    Baseline Antalgic gait over the R LE    Status Deferred    Target Date 04/26/21      PT LONG TERM GOAL #4   Title Pt will be Ind in a final HEP to maintain or progress achieved LOF    Status New    Target Date 04/26/21                     Plan - 03/08/21 0711     Clinical Impression Statement Pt presents with R knee pain consistent with referring dx of R patella chondromalacia. Deficits include crepitus a knee movement, R LE weakness, atrophy of the R quad, welling of the R knee and calf, worsening pain c prolonged standing and walking with development of an antalgic gait pattern. Pt will benefir from PR 2w6 to address deficits, reduce pain and optimize function of the R knee.    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 Assess 5xSTS and 2MWT and set goals as indicated.    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   Eczema 09/04/2014   Abnormal quad screen 03/03/2013   Obesity (BMI 30-39.9) 11/21/2012   Asthma 11/21/2012   Current smoker 11/21/2012   Condyloma acuminatum of perianal region 11/21/2012   Migraine 11/21/2012    Joellyn RuedAllen Cyana Shook MS, PT 03/08/21 5:28 PM   [facility] [address] [place], [place], [address] Phone: [phone]   Fax:  [phone]  Name: Pascal LuxJustice Cullens MRN: [id number] Date of Birth: 08-Jul-1986

## 2021-03-14 ENCOUNTER — Encounter: Payer: Self-pay | Admitting: Physical Therapy

## 2021-03-14 ENCOUNTER — Other Ambulatory Visit: Payer: Self-pay

## 2021-03-14 ENCOUNTER — Ambulatory Visit: Payer: Medicaid Other

## 2021-03-14 ENCOUNTER — Ambulatory Visit: Payer: Medicaid Other | Admitting: Physical Therapy

## 2021-03-14 DIAGNOSIS — R2689 Other abnormalities of gait and mobility: Secondary | ICD-10-CM

## 2021-03-14 DIAGNOSIS — G8929 Other chronic pain: Secondary | ICD-10-CM

## 2021-03-14 DIAGNOSIS — M2241 Chondromalacia patellae, right knee: Secondary | ICD-10-CM | POA: Diagnosis not present

## 2021-03-14 DIAGNOSIS — M6281 Muscle weakness (generalized): Secondary | ICD-10-CM

## 2021-03-14 DIAGNOSIS — M25561 Pain in right knee: Secondary | ICD-10-CM

## 2021-03-14 DIAGNOSIS — R262 Difficulty in walking, not elsewhere classified: Secondary | ICD-10-CM

## 2021-03-14 NOTE — Therapy (Signed)
[facility] Outpatient Rehabilitation [facility] [address] [place], [place], [address] Phone: [phone]   Fax:  [phone]  Physical Therapy Treatment  Patient Details  Name: Robyn Ortiz MRN: [id number] Date of Birth: 1985/12/29 Referring Provider (PT): Frederico Hamman, MD   Encounter Date: 03/14/2021   PT End of Session - 03/14/21 0756     Visit Number 2    Number of Visits 13    Date for PT Re-Evaluation 04/26/21    Authorization Type [place] MEDICAID [facility]    Authorization Time Period 03/14/21-05/13/21    Authorization - Visit Number 1    Authorization - Number of Visits 12    PT Start Time 0721    PT Stop Time 0759    PT Time Calculation (min) 38 min             Past Medical History:  Diagnosis Date   Diverticulitis    GERD (gastroesophageal reflux disease)    History of bleeding ulcers    told not to take ibuprofen   History of bronchitis 59yrs ago   Migraine    Obesity    Sleep apnea    CPAP    Past Surgical History:  Procedure Laterality Date   CESAREAN SECTION N/A 04/08/2015   Procedure: CESAREAN SECTION;  Surgeon: Catalina Antigua, MD;  Location: [facility];  Service: Obstetrics;  Laterality: N/A;   CESAREAN SECTION WITH BILATERAL TUBAL LIGATION  04/08/2015   CHOLECYSTECTOMY  03-27-14   Dr Lemar Livings   ESOPHAGOGASTRODUODENOSCOPY N/A 05/19/2017   Procedure: ESOPHAGOGASTRODUODENOSCOPY (EGD);  Surgeon: Toney Reil, MD;  Location: [facility];  Service: Endoscopy;  Laterality: N/A;   TONSILLECTOMY  03/10/2012   Procedure: TONSILLECTOMY;  Surgeon: Suzanna Obey, MD;  Location: [facility] OR;  Service: ENT;  Laterality: Bilateral;    There were no vitals filed for this visit.   Subjective Assessment - 03/14/21 0724     Subjective Knee is not hurting too bad, 5/10.    Currently in Pain? Yes    Pain Score 5                 [facility] PT Assessment - 03/14/21 0001       6 Minute walk- Post Test   6 Minute Walk Post Test yes      6  minute walk test results    Aerobic Endurance Distance Walked 420      Standardized Balance Assessment   Standardized Balance Assessment Five Times Sit to Stand    Five times sit to stand comments  16.6                           [facility] Adult PT Treatment/Exercise - 03/14/21 0001       Knee/Hip Exercises: Stretches   Active Hamstring Stretch Right;3 reps;30 seconds    Active Hamstring Stretch Limitations supine with strap    Hip Flexor Stretch 3 reps;30 seconds    Hip Flexor Stretch Limitations mod thomas with strap      Knee/Hip Exercises: Aerobic   Recumbent Bike Rec bike L2 x 5 minutes      Knee/Hip Exercises: Seated   Long Arc Quad 10 reps;2 sets    [employer] Limitations with ball squeeze      Knee/Hip Exercises: Supine   Quad Sets 10 reps    Quad Sets Limitations towel roll under knee    Short Arc [employer] 20 reps    Bridges 10  reps;2 sets    Straight Leg Raises Right;10 reps;2 sets    Straight Leg Raises Limitations c quad set prio to SLR      Knee/Hip Exercises: Sidelying   Hip ABduction Right;10 reps;2 sets    Hip ADduction 10 reps;2 sets;Right      Knee/Hip Exercises: Prone   Hip Extension Right;10 reps;2 sets                      PT Short Term Goals - 03/08/21 1659       PT SHORT TERM GOAL #1   Title Pt will be Ind in an initial HEP    Baseline Stare on eval    Status New    Target Date 03/29/21      PT SHORT TERM GOAL #2   Title Pt will report understanding of measures to assist in the reduction of R knee pain.    Status New    Target Date 03/29/21               PT Long Term Goals - 03/08/21 1704       PT LONG TERM GOAL #1   Title Pt's R hip and lnee strength will increase to 5/5 to improve the functional use of the R LE    Baseline 4+5/    Status New    Target Date 04/26/21      PT LONG TERM GOAL #2   Title Pt will report a decrease in R knee pain to 4/10 or less with daily and work related activites     Baseline 3-8/10    Status New    Target Date 04/26/21      PT LONG TERM GOAL #3   Title pt will be able to walk without and antalgic gait    Baseline Antalgic gait over the R LE    Status Deferred    Target Date 04/26/21      PT LONG TERM GOAL #4   Title Pt will be Ind in a final HEP to maintain or progress achieved LOF    Status New    Target Date 04/26/21                   Plan - 03/14/21 0810     Clinical Impression Statement Pt arrives for her first PT treatment and reports compliance with HEP x 2 since evaluation. Reviewed HEP and progressed with additional quad activation. Manual STW with foam roller performed to decrease palpable tautness in lateral quad. She reported back pain with bridging.    PT Next Visit Plan Assess response to HEP. and set goals as indicated for 2 MWT and 5 x STS    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  Chronic pain of right knee  Muscle weakness (generalized)  Other abnormalities of gait and mobility  Difficulty in walking, not elsewhere classified     Problem List Patient Active Problem List   Diagnosis Date Noted   Postpartum depression 04/23/2015   Eczema 09/04/2014   Abnormal quad screen 03/03/2013   Obesity (BMI 30-39.9) 11/21/2012   Asthma 11/21/2012   Current smoker 11/21/2012   Condyloma acuminatum of perianal region 11/21/2012  Migraine 11/21/2012    Sherrie Mustache, PTA 03/14/2021, 8:19 AM  [facility] [address] [place], [place], [address] Phone: [phone]   Fax:  [phone]  Name: Robyn Ortiz MRN: [id number] Date of Birth: 07/22/1986

## 2021-03-15 ENCOUNTER — Ambulatory Visit: Payer: Medicaid Other | Admitting: Physical Therapy

## 2021-03-21 ENCOUNTER — Ambulatory Visit: Payer: Medicaid Other | Admitting: Physical Therapy

## 2021-03-28 ENCOUNTER — Ambulatory Visit: Payer: Medicaid Other | Attending: Orthopedic Surgery

## 2021-03-28 ENCOUNTER — Other Ambulatory Visit: Payer: Self-pay

## 2021-03-28 DIAGNOSIS — M2241 Chondromalacia patellae, right knee: Secondary | ICD-10-CM | POA: Diagnosis present

## 2021-03-28 DIAGNOSIS — R2689 Other abnormalities of gait and mobility: Secondary | ICD-10-CM | POA: Diagnosis present

## 2021-03-28 DIAGNOSIS — M25561 Pain in right knee: Secondary | ICD-10-CM | POA: Insufficient documentation

## 2021-03-28 DIAGNOSIS — M6281 Muscle weakness (generalized): Secondary | ICD-10-CM | POA: Diagnosis present

## 2021-03-28 DIAGNOSIS — G8929 Other chronic pain: Secondary | ICD-10-CM | POA: Insufficient documentation

## 2021-03-28 NOTE — Therapy (Signed)
[facility] Outpatient Rehabilitation [facility] [address] [place], [place], [address] Phone: [phone]   Fax:  [phone]  Physical Therapy Treatment  Patient Details  Name: Robyn Ortiz MRN: [id number] Date of Birth: September 13, 1985 Referring Provider (PT): Frederico Hamman, MD   Encounter Date: 03/28/2021   PT End of Session - 03/28/21 1319     Visit Number 3    Number of Visits 13    Date for PT Re-Evaluation 04/26/21    Authorization Type [place] MEDICAID [facility]    Authorization Time Period 03/14/21-05/13/21    Authorization - Visit Number 2    Authorization - Number of Visits 12    Progress Note Due on Visit 10    PT Start Time 0717    PT Stop Time 0800    PT Time Calculation (min) 43 min    Activity Tolerance Patient tolerated treatment well    Behavior During Therapy [facility] for tasks assessed/performed             Past Medical History:  Diagnosis Date   Diverticulitis    GERD (gastroesophageal reflux disease)    History of bleeding ulcers    told not to take ibuprofen   History of bronchitis 69yrs ago   Migraine    Obesity    Sleep apnea    CPAP    Past Surgical History:  Procedure Laterality Date   CESAREAN SECTION N/A 04/08/2015   Procedure: CESAREAN SECTION;  Surgeon: Catalina Antigua, MD;  Location: [facility];  Service: Obstetrics;  Laterality: N/A;   CESAREAN SECTION WITH BILATERAL TUBAL LIGATION  04/08/2015   CHOLECYSTECTOMY  03-27-14   Dr Lemar Livings   ESOPHAGOGASTRODUODENOSCOPY N/A 05/19/2017   Procedure: ESOPHAGOGASTRODUODENOSCOPY (EGD);  Surgeon: Toney Reil, MD;  Location: [facility];  Service: Endoscopy;  Laterality: N/A;   TONSILLECTOMY  03/10/2012   Procedure: TONSILLECTOMY;  Surgeon: Suzanna Obey, MD;  Location: [facility] OR;  Service: ENT;  Laterality: Bilateral;    There were no vitals filed for this visit.   Subjective Assessment - 03/28/21 0720     Subjective My knees about the same. It hurts wrose the more I'm up on my  feet. i've decreased my work schedule to 3 day a week from 5, but I still work 36 hrs a week    Patient Stated Goals To fix the issue with her R knee    Currently in Pain? Yes    Pain Score 6     Pain Location Knee    Pain Orientation Right    Pain Descriptors / Indicators Aching;Sharp    Pain Type Chronic pain    Pain Onset More than a month ago    Pain Frequency Constant    Aggravating Factors  standing, walking, steps    Pain Relieving Factors ibuprofen, tylenol, ice pack, rest                               [facility] Adult PT Treatment/Exercise - 03/28/21 0001       Knee/Hip Exercises: Stretches   Active Hamstring Stretch Right;3 reps;30 seconds    Active Hamstring Stretch Limitations supine with strap    Hip Flexor Stretch 3 reps;30 seconds    Hip Flexor Stretch Limitations mod thomas with strap    ITB Stretch 2 reps;30 seconds      Knee/Hip Exercises: Aerobic   Recumbent Bike Rec bike L2 x 5 minutes      Knee/Hip  Exercises: Seated   Long Arc Quad 10 reps;2 sets    [employer] Weight 3 lbs.    Long [employer] Limitations with ball squeeze      Knee/Hip Exercises: Supine   Bridges 10 reps;2 sets    Straight Leg Raises Right;10 reps;2 sets    Straight Leg Raises Limitations 3 lb, c quad set prior to SLR      Knee/Hip Exercises: Sidelying   Hip ABduction Right;10 reps;2 sets    Hip ABduction Limitations 3 lbs    Hip ADduction 10 reps;2 sets;Right    Hip ADduction Limitations 3 lbs      Knee/Hip Exercises: Prone   Hip Extension Right;10 reps;2 sets    Hip Extension Limitations 3 lbs      Manual Therapy   Manual Therapy Soft tissue mobilization    Soft tissue mobilization IASTM c roller to the lateral thigh and ITB f/b manual STM                      PT Short Term Goals - 03/28/21 1320       PT SHORT TERM GOAL #1   Title Pt will be Ind in an initial HEP    Status Achieved    Target Date 03/29/21               PT Long Term  Goals - 03/28/21 1330       PT LONG TERM GOAL #5   Title Improve 5XSTS to 12 sec or less as demostration of improved function of the R knee    Status New    Target Date 04/26/21      Additional Long Term Goals   Additional Long Term Goals Yes      PT LONG TERM GOAL #6   Title Improve to 418ft as indictation of improved function of the R knee    Status New    Target Date 04/26/21                   Plan - 03/28/21 1320     Clinical Impression Statement Pt reports completing her HEP 2x per week. Recommended completing the strengthening exs every other day. Pt notes her work/home schedule is very busy. PT was completd for flexibility streches and strengthening for the R knee/leg. ITB stretch was added today. Additionally, STM was provide to the lateral/ITB to address muscle tightness/strain to the R knee. To date, pt's R knee pain has not improved.    Personal Factors and Comorbidities Time since onset of injury/illness/exacerbation;Comorbidity 1    Comorbidities High BMI    Examination-Activity Limitations Transfers;Locomotion Level;Carry;Lift;Stairs;Squat    Examination-Participation Restrictions Occupation;Other    Stability/Clinical Decision Making Stable/Uncomplicated    Clinical Decision Making Low    Rehab Potential Good    PT Frequency 2x / week    PT Duration 6 weeks    PT Treatment/Interventions ADLs/Self Care Home Management;Cryotherapy;Electrical Stimulation;Iontophoresis 4mg /ml Dexamethasone;Moist Heat;Ultrasound;Therapeutic exercise;Therapeutic activities;Stair training;Gait training;Functional mobility training;Patient/family education;Manual techniques;Taping;Vasopneumatic Device;Joint Manipulations    PT Next Visit Plan Assess response to HEP. and set goals as indicated for 2 MWT and 5 x STS    PT Home Exercise Plan [id number]    Consulted and Agree with Plan of Care Patient             Patient will benefit from skilled therapeutic intervention in  order to improve the following deficits and impairments:  Abnormal gait, Difficulty walking, Obesity, Decreased  activity tolerance, Pain, Decreased strength  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  Chronic pain of right knee  Muscle weakness (generalized)  Other abnormalities of gait and mobility     Problem List Patient Active Problem List   Diagnosis Date Noted   Postpartum depression 04/23/2015   Eczema 09/04/2014   Abnormal quad screen 03/03/2013   Obesity (BMI 30-39.9) 11/21/2012   Asthma 11/21/2012   Current smoker 11/21/2012   Condyloma acuminatum of perianal region 11/21/2012   Migraine 11/21/2012    Joellyn Rued MS, PT 03/28/21 1:35 PM   [facility] [address] [place], [place], [address] Phone: [phone]   Fax:  [phone]  Name: Cherene Dobbins MRN: [id number] Date of Birth: [birth year]

## 2021-04-04 ENCOUNTER — Ambulatory Visit: Payer: Medicaid Other

## 2021-04-11 ENCOUNTER — Ambulatory Visit: Payer: Medicaid Other

## 2021-04-28 ENCOUNTER — Telehealth: Payer: Self-pay

## 2021-04-28 ENCOUNTER — Ambulatory Visit: Payer: Medicaid Other | Attending: Orthopedic Surgery

## 2021-04-28 DIAGNOSIS — G8929 Other chronic pain: Secondary | ICD-10-CM | POA: Insufficient documentation

## 2021-04-28 DIAGNOSIS — M6281 Muscle weakness (generalized): Secondary | ICD-10-CM | POA: Insufficient documentation

## 2021-04-28 DIAGNOSIS — R2689 Other abnormalities of gait and mobility: Secondary | ICD-10-CM | POA: Insufficient documentation

## 2021-04-28 DIAGNOSIS — M25561 Pain in right knee: Secondary | ICD-10-CM | POA: Insufficient documentation

## 2021-04-28 DIAGNOSIS — M2241 Chondromalacia patellae, right knee: Secondary | ICD-10-CM | POA: Insufficient documentation

## 2021-04-28 DIAGNOSIS — R262 Difficulty in walking, not elsewhere classified: Secondary | ICD-10-CM | POA: Insufficient documentation

## 2021-04-28 NOTE — Telephone Encounter (Signed)
Spoke with pt regarding her 2nd no-show. Discussed clinic attendance policy and cancelled pt's future visits except for her upcoming appointment on Wednesday. Pt confirmed she would be coming to her next appointment.

## 2021-04-30 ENCOUNTER — Ambulatory Visit: Payer: Medicaid Other

## 2021-05-05 ENCOUNTER — Encounter: Payer: Medicaid Other | Admitting: Physical Therapy

## 2021-05-06 ENCOUNTER — Other Ambulatory Visit: Payer: Self-pay

## 2021-05-06 ENCOUNTER — Ambulatory Visit: Payer: Medicaid Other

## 2021-05-06 DIAGNOSIS — R2689 Other abnormalities of gait and mobility: Secondary | ICD-10-CM | POA: Diagnosis present

## 2021-05-06 DIAGNOSIS — M2241 Chondromalacia patellae, right knee: Secondary | ICD-10-CM | POA: Diagnosis present

## 2021-05-06 DIAGNOSIS — M25561 Pain in right knee: Secondary | ICD-10-CM | POA: Diagnosis present

## 2021-05-06 DIAGNOSIS — R262 Difficulty in walking, not elsewhere classified: Secondary | ICD-10-CM | POA: Diagnosis present

## 2021-05-06 DIAGNOSIS — M6281 Muscle weakness (generalized): Secondary | ICD-10-CM

## 2021-05-06 DIAGNOSIS — G8929 Other chronic pain: Secondary | ICD-10-CM | POA: Diagnosis present

## 2021-05-06 NOTE — Therapy (Signed)
[facility] [address] [place], [place], [address] Phone: [phone]   Fax:  [phone]  Physical Therapy Treatment/ Re-evaluation  Patient Details  Name: Robyn Ortiz MRN: [id number] Date of Birth: 01/19/1986 Referring Provider (PT): Frederico Hamman, MD   Encounter Date: 05/06/2021   PT End of Session - 05/06/21 1859     Visit Number 4    Number of Visits 20    Date for PT Re-Evaluation 07/01/21    Authorization Type [place] MEDICAID [facility]    Authorization Time Period 03/14/21-05/13/21    Authorization - Visit Number 3    Authorization - Number of Visits 12    Progress Note Due on Visit 10    PT Start Time 1835    PT Stop Time 1915    PT Time Calculation (min) 40 min    Activity Tolerance Patient tolerated treatment well    Behavior During Therapy [facility] for tasks assessed/performed             Past Medical History:  Diagnosis Date   Diverticulitis    GERD (gastroesophageal reflux disease)    History of bleeding ulcers    told not to take ibuprofen   History of bronchitis 18yrs ago   Migraine    Obesity    Sleep apnea    CPAP    Past Surgical History:  Procedure Laterality Date   CESAREAN SECTION N/A 04/08/2015   Procedure: CESAREAN SECTION;  Surgeon: Catalina Antigua, MD;  Location: [facility];  Service: Obstetrics;  Laterality: N/A;   CESAREAN SECTION WITH BILATERAL TUBAL LIGATION  04/08/2015   CHOLECYSTECTOMY  03-27-14   Dr Lemar Livings   ESOPHAGOGASTRODUODENOSCOPY N/A 05/19/2017   Procedure: ESOPHAGOGASTRODUODENOSCOPY (EGD);  Surgeon: Toney Reil, MD;  Location: [facility];  Service: Endoscopy;  Laterality: N/A;   TONSILLECTOMY  03/10/2012   Procedure: TONSILLECTOMY;  Surgeon: Suzanna Obey, MD;  Location: [facility] OR;  Service: ENT;  Laterality: Bilateral;    There were no vitals filed for this visit.   Subjective Assessment - 05/06/21 1834     Subjective Pt presents with documentation from [facility]/  Thurston Hole describing her recent procedure on 04/23/2021. She is 13 days s/p R knee arthroscopy, partial medial meniscectomy, chondroplasty medial compartment patellofemoral area on 04/23/2021 with Dr. Kristeen Miss, M.D. She reports continuing to ice/ elevate since her surgery and had her sutures removed about a week ago. She was told she has no weight-bearing restrictions, but to avoid excessive stair navigation early in the treatment.    How long can you sit comfortably? 1 hour    How long can you stand comfortably? 10-15 minutes    How long can you walk comfortably? 30 minutes    Currently in Pain? Yes    Pain Score 6     Pain Location Knee    Pain Orientation Right    Pain Descriptors / Indicators Aching;Throbbing    Pain Type Surgical pain    Pain Onset More than a month ago    Pain Frequency Constant                [facility] PT Assessment - 05/06/21 0001       ROM / Strength   AROM / PROM / Strength AROM      AROM   Right Knee Extension 8   painful   Right Knee Flexion 126   painful   Left Knee Extension -2    Left Knee Flexion 145  Strength   Right Knee Flexion 4+/5   posterior knee pain   Right Knee Extension 4+/5   anterior knee pain   Left Knee Flexion 5/5    Left Knee Extension 5/5      Standardized Balance Assessment   Standardized Balance Assessment Five Times Sit to Stand    Five times sit to stand comments  16                           [facility] Adult PT Treatment/Exercise - 05/06/21 0001       Knee/Hip Exercises: Standing   Lateral Step Up Both;3 sets;10 reps;Step Height: 6"    Wall Squat Other (comment)    Wall Squat Limitations wall sits 3x30sec      Knee/Hip Exercises: Seated   Long Cytogeneticist    Long Arc Quad Weight 15 lbs.    Long Arc Quad Limitations Eccentric (2 up, 1 down) 3x10      Modalities   Modalities Vasopneumatic      Vasopneumatic   Number Minutes Vasopneumatic  10 minutes    Vasopnuematic Location  Knee    R   Vasopneumatic Pressure Medium    Vasopneumatic Temperature  34                     PT Education - 05/06/21 1858     Education Details Discussed POC, updated HEP, reinforcing proper form    Person(s) Educated Patient    Methods Explanation;Demonstration;Handout    Comprehension Verbalized understanding;Returned demonstration              PT Short Term Goals - 03/28/21 1320       PT SHORT TERM GOAL #1   Title Pt will be Ind in an initial HEP    Status Achieved    Target Date 03/29/21               PT Long Term Goals - 05/06/21 1913       PT LONG TERM GOAL #1   Title Pt's R hip and lnee strength will increase to 5/5 to improve the functional use of the R LE    Baseline 5/5    Status Achieved      PT LONG TERM GOAL #2   Title Pt will report a decrease in R knee pain to 4/10 or less with daily and work related activites    Baseline 6/10    Time 8    Period Weeks    Status Revised    Target Date 07/01/21      PT LONG TERM GOAL #3   Title pt will be able to walk without and antalgic gait    Baseline Antalgic gait over the R LE    Time 8    Period Weeks    Status Revised    Target Date 07/01/21      PT LONG TERM GOAL #4   Title Pt will be Ind in a final HEP to maintain or progress achieved LOF    Baseline HEP updated at re-eval    Time 8    Period Weeks    Status Revised    Target Date 07/01/21      PT LONG TERM GOAL #5   Title Improve 5XSTS to 12 sec or less as demostration of improved function of the R knee    Baseline 16 seconds    Time 8  Period Weeks    Status New    Target Date 07/01/21      PT LONG TERM GOAL #6   Title Improve to 461ft as indictation of improved function of the R knee    Baseline See flowsheet    Time 8    Period Weeks    Status Revised    Target Date 07/01/21                   Plan - 05/06/21 1901     Clinical Impression Statement Pt reports with a change of status following a recent  surgery. She is 13 days s/p R knee arthroscopy, partial medial meniscectomy, chondroplasty medial compartment patellofemoral area on 04/23/2021 with Dr. Kristeen Miss, M.D. Upon re-assessment of objective measures, the pt remains limited in R knee AROM into flexion and extension and has pain with resisted R knee motions, as well. She responded well to quadriceps loading/ reinforcement today, demonstrating proper form and minor increase in pain with selected exercises. Following cryotherapy, the pt's pain decreased to 4/10. Will utilize [employer] partial meniscectomy protocol throughout POC. She will benefit from continued skilled PT to address her primary impairments and return to her prior level of function without limitation.    Personal Factors and Comorbidities Time since onset of injury/illness/exacerbation;Comorbidity 1    Comorbidities High BMI    Examination-Activity Limitations Transfers;Locomotion Level;Carry;Lift;Stairs;Squat    Examination-Participation Restrictions Occupation;Other    Stability/Clinical Decision Making Stable/Uncomplicated    Clinical Decision Making Low    Rehab Potential Good    PT Frequency 2x / week    PT Duration 8 weeks    PT Treatment/Interventions ADLs/Self Care Home Management;Cryotherapy;Electrical Stimulation;Iontophoresis 4mg /ml Dexamethasone;Moist Heat;Ultrasound;Therapeutic exercise;Therapeutic activities;Stair training;Gait training;Functional mobility training;Patient/family education;Manual techniques;Taping;Vasopneumatic Device;Joint Manipulations;Neuromuscular re-education;Scar mobilization;Passive range of motion    PT Next Visit Plan Progress LE strengthening regimen, progress to plyometric/ endurance exercise as able    PT Home Exercise Plan [id number]    Consulted and Agree with Plan of Care Patient             Patient will benefit from skilled therapeutic intervention in order to improve the following deficits and  impairments:  Abnormal gait, Difficulty walking, Obesity, Decreased activity tolerance, Pain, Decreased strength  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  Chronic pain of right knee  Muscle weakness (generalized)  Other abnormalities of gait and mobility  Difficulty in walking, not elsewhere classified     Problem List Patient Active Problem List   Diagnosis Date Noted   Postpartum depression 04/23/2015   Eczema 09/04/2014   Abnormal quad screen 03/03/2013   Obesity (BMI 30-39.9) 11/21/2012   Asthma 11/21/2012   Current smoker 11/21/2012   Condyloma acuminatum of perianal region 11/21/2012   Migraine 11/21/2012    01/21/2013, PT, DPT 05/06/21 7:17 PM   [facility] [address] [place], [place], [place] Phone: [phone]   Fax:  [phone]  Name: Robyn Ortiz MRN: [name] Date of Birth: 1986-05-22

## 2021-05-06 NOTE — Patient Instructions (Signed)
  [id number]

## 2021-05-07 ENCOUNTER — Encounter: Payer: Medicaid Other | Admitting: Physical Therapy

## 2021-05-12 ENCOUNTER — Other Ambulatory Visit: Payer: Self-pay

## 2021-05-12 ENCOUNTER — Ambulatory Visit
Admission: EM | Admit: 2021-05-12 | Discharge: 2021-05-12 | Disposition: A | Discharge status: Home / Self Care | Payer: Medicaid Other

## 2021-05-12 DIAGNOSIS — H7292 Unspecified perforation of tympanic membrane, left ear: Secondary | ICD-10-CM

## 2021-05-12 DIAGNOSIS — J029 Acute pharyngitis, unspecified: Secondary | ICD-10-CM

## 2021-05-12 MED ORDER — AMOXICILLIN-POT CLAVULANATE 875-125 MG PO TABS: 1.0000 | ORAL_TABLET | Freq: Two times a day (BID) | ORAL | 0 refills | Status: AC

## 2021-05-12 NOTE — ED Provider Notes (Signed)
[facility]    CSN: [id number] Arrival date & time: 05/12/21  1251      History   Chief Complaint No chief complaint on file.   HPI Robyn Ortiz is a 35 y.o. female.   Patient complains of left ear pain, states pain became very sharp last night and she noticed that there was some wetness in that ear.  Patient states she continues to smoke cigarettes regularly, has a prescription for nicotine patches but has not set a quit date at this time.  Patient denies loss of hearing.  Patient states along with left ear pain, she has had a sore throat, congestion and headache.  Patient states she has tried Tylenol and ibuprofen with some relief.  Patient denies fever, aches, chills, nausea, vomiting, diarrhea, loss of taste or smell.  Patient states headache is not intractable.  The history is provided by the patient.   Past Medical History:  Diagnosis Date   Diverticulitis    GERD (gastroesophageal reflux disease)    History of bleeding ulcers    told not to take ibuprofen   History of bronchitis 59yrs ago   Migraine    Obesity    Sleep apnea    CPAP    Patient Active Problem List   Diagnosis Date Noted   Postpartum depression 04/23/2015   Eczema 09/04/2014   Abnormal quad screen 03/03/2013   Obesity (BMI 30-39.9) 11/21/2012   Asthma 11/21/2012   Current smoker 11/21/2012   Condyloma acuminatum of perianal region 11/21/2012   Migraine 11/21/2012    Past Surgical History:  Procedure Laterality Date   CESAREAN SECTION N/A 04/08/2015   Procedure: CESAREAN SECTION;  Surgeon: Catalina Antigua, MD;  Location: [facility];  Service: Obstetrics;  Laterality: N/A;   CESAREAN SECTION WITH BILATERAL TUBAL LIGATION  04/08/2015   CHOLECYSTECTOMY  03-27-14   Dr Lemar Livings   ESOPHAGOGASTRODUODENOSCOPY N/A 05/19/2017   Procedure: ESOPHAGOGASTRODUODENOSCOPY (EGD);  Surgeon: Toney Reil, MD;  Location: [facility];  Service: Endoscopy;  Laterality: N/A;   TONSILLECTOMY   03/10/2012   Procedure: TONSILLECTOMY;  Surgeon: Suzanna Obey, MD;  Location: [facility] OR;  Service: ENT;  Laterality: Bilateral;    OB History     Gravida  3   Para  3   Term  3   Preterm      AB      Living  3      SAB      IAB      Ectopic      Multiple  0   Live Births  3        Obstetric Comments  1st Menstrual Cycle:  14  1st Pregnancy:  17          Home Medications    Prior to Admission medications   Medication Sig Start Date End Date Taking? Authorizing Provider  amoxicillin-clavulanate (AUGMENTIN) 875-125 MG tablet Take 1 tablet by mouth every 12 (twelve) hours for 10 days. 05/12/21 05/22/21 Yes Theadora Rama Scales, PA-C  acetaminophen (TYLENOL) 325 MG tablet Take 650 mg by mouth every 6 (six) hours as needed for moderate pain.    [provider]  ASPIRIN LOW DOSE 81 MG EC tablet PLEASE SEE ATTACHED FOR DETAILED DIRECTIONS 04/23/21   [provider]  celecoxib (CELEBREX) 200 MG capsule Take by mouth. 05/01/21   [provider]  cetirizine (ZYRTEC) 10 MG tablet Take 10 mg by mouth daily. 04/04/17   [provider]  cyclobenzaprine (FLEXERIL) 10  MG tablet Take 1 tablet (10 mg total) by mouth 2 (two) times daily as needed for muscle spasms. 01/16/21   Robinson, [place] N, PA-C  cyclobenzaprine (FLEXERIL) 10 MG tablet Take 1 tablet by mouth twice daily as needed for muscle spasms 01/16/21   Robinson, [place] N, PA-C  dicyclomine (BENTYL) 10 MG capsule Take 1 capsule (10 mg total) by mouth 4 (four) times daily -  before meals and at bedtime. 10/06/17 11/05/17  Toney Reil, MD  SYMBICORT 160-4.5 MCG/ACT inhaler SMARTSIG:2 Puff(s) By Mouth Twice Daily 03/14/21   [provider]    Family History Family History  Problem Relation Age of Onset   Diabetes Father    Bipolar disorder Mother    Heart disease Paternal Grandmother    Cancer Paternal Grandfather        intestional cancer   Other Paternal Grandfather         deteriorating disc    Social History Social History   Tobacco Use   Smoking status: Every Day    Packs/day: 1.00    Years: 13.00    Pack years: 13.00    Types: Cigarettes   Smokeless tobacco: Never  Vaping Use   Vaping Use: Never used  Substance Use Topics   Alcohol use: Yes    Comment: beer occasionally   Drug use: No     Allergies   Codeine and Ibuprofen   Review of Systems Review of Systems Per HPI  Physical Exam Triage Vital Signs ED Triage Vitals  Enc Vitals Group     BP 05/12/21 1406 98/66     Pulse Rate 05/12/21 1406 72     Resp 05/12/21 1406 20     Temp 05/12/21 1406 98 F (36.7 C)     Temp Source 05/12/21 1406 Oral     SpO2 05/12/21 1406 98 %     Weight --      Height --      Head Circumference --      Peak Flow --      Pain Score 05/12/21 1358 6     Pain Loc --      Pain Edu? --      Excl. in GC? --    No data found.  Updated Vital Signs BP 98/66 (BP Location: Left Arm)   Pulse 72   Temp 98 F (36.7 C) (Oral)   Resp 20   LMP 04/21/2021 (Approximate)   SpO2 98%   Visual Acuity Right Eye Distance:   Left Eye Distance:   Bilateral Distance:    Right Eye Near:   Left Eye Near:    Bilateral Near:     Physical Exam Vitals and nursing note reviewed.  Constitutional:      Appearance: Normal appearance.  HENT:     Head: Normocephalic and atraumatic.     Right Ear: Tympanic membrane, ear canal and external ear normal. No decreased hearing noted.     Left Ear: Ear canal and external ear normal. Decreased hearing noted. Drainage and tenderness present. Tympanic membrane is perforated.     Nose: Nose normal.     Mouth/Throat:     Mouth: Mucous membranes are moist.     Pharynx: Oropharynx is clear.  Eyes:     Extraocular Movements: Extraocular movements intact.     Conjunctiva/sclera: Conjunctivae normal.     Pupils: Pupils are equal, round, and reactive to light.  Cardiovascular:     Rate and Rhythm: Normal rate and regular  rhythm.      Pulses: Normal pulses.     Heart sounds: Normal heart sounds.  Pulmonary:     Effort: Pulmonary effort is normal.     Breath sounds: Normal breath sounds.  Abdominal:     General: Abdomen is flat. Bowel sounds are normal.     Palpations: Abdomen is soft.  Musculoskeletal:        General: Normal range of motion.     Cervical back: Normal range of motion and neck supple.  Skin:    General: Skin is warm and dry.  Neurological:     General: No focal deficit present.     Mental Status: She is alert and oriented to person, place, and time. Mental status is at baseline.  Psychiatric:        Mood and Affect: Mood normal.        Behavior: Behavior normal.     UC Treatments / Results  Labs (all labs ordered are listed, but only abnormal results are displayed) Labs Reviewed  COVID-19, FLU A+B NAA    EKG   Radiology No results found.  Procedures Procedures (including critical care time)  Medications Ordered in UC Medications - No data to display  Initial Impression / Assessment and Plan / UC Course  I have reviewed the triage vital signs and the nursing notes.  Pertinent labs & imaging results that were available during my care of the patient were reviewed by me and considered in my medical decision making (see chart for details).     Patient has a ruptured left tympanic membrane, will begin patient on Augmentin to treat for probable inner ear infection.  Patient advised of plan as prescribed, patient verbalized understanding and agreement of plan.  Patient given strict return precautions for worsening pain, bloody or purulent drainage from ear, worsening headache or unrelenting fever. Final Clinical Impressions(s) / UC Diagnoses   Final diagnoses:  Rupture of left tympanic membrane  Acute pharyngitis, unspecified etiology     Discharge Instructions      Your left tympanic membrane, eardrum, is ruptured.  This is concerning for infection and antibiotic treatment  is indicated.  I sent a prescription for Augmentin to your pharmacy, please take 1 tablet morning and evening for the next 10 days.  Please return for follow-up evaluation if you do not have complete resolution of your symptoms or you begin to have worsening pain or purulent drainage from your ear.  The results of your flu and COVID tests will be made available to you once they are received.     ED Prescriptions     Medication Sig Dispense Auth. Provider   amoxicillin-clavulanate (AUGMENTIN) 875-125 MG tablet Take 1 tablet by mouth every 12 (twelve) hours for 10 days. 20 tablet Theadora Rama Scales, PA-C      PDMP not reviewed this encounter.   Theadora Rama Scales, PA-C 05/12/21 1443

## 2021-05-12 NOTE — Discharge Instructions (Addendum)
Your left tympanic membrane, eardrum, is ruptured.  This is concerning for infection and antibiotic treatment is indicated.  I sent a prescription for Augmentin to your pharmacy, please take 1 tablet morning and evening for the next 10 days.  Please return for follow-up evaluation if you do not have complete resolution of your symptoms or you begin to have worsening pain or purulent drainage from your ear.  The results of your flu and COVID tests will be made available to you once they are received.

## 2021-05-12 NOTE — ED Triage Notes (Signed)
Sore throat, nasal congestion, headache and ear pain to left ear that started yesterday.

## 2021-05-13 ENCOUNTER — Encounter: Payer: Self-pay | Admitting: Physical Therapy

## 2021-05-13 ENCOUNTER — Ambulatory Visit: Payer: Medicaid Other | Admitting: Physical Therapy

## 2021-05-13 DIAGNOSIS — G8929 Other chronic pain: Secondary | ICD-10-CM

## 2021-05-13 DIAGNOSIS — M6281 Muscle weakness (generalized): Secondary | ICD-10-CM

## 2021-05-13 DIAGNOSIS — R262 Difficulty in walking, not elsewhere classified: Secondary | ICD-10-CM

## 2021-05-13 DIAGNOSIS — M2241 Chondromalacia patellae, right knee: Secondary | ICD-10-CM | POA: Diagnosis not present

## 2021-05-13 DIAGNOSIS — R2689 Other abnormalities of gait and mobility: Secondary | ICD-10-CM

## 2021-05-13 LAB — COVID-19, FLU A+B NAA
Influenza A, NAA: NOT DETECTED
Influenza B, NAA: NOT DETECTED
SARS-CoV-2, NAA: NOT DETECTED

## 2021-05-13 NOTE — Therapy (Signed)
[facility] [address] [place], [place], [address] Phone: [phone]   Fax:  [phone]  Physical Therapy Treatment  Patient Details  Name: Robyn Ortiz MRN: [id number] Date of Birth: Jun 12, 1986 Referring Provider (PT): Frederico Hamman, MD   Encounter Date: 05/13/2021   PT End of Session - 05/13/21 1833     Visit Number 5    Number of Visits 20    Date for PT Re-Evaluation 07/01/21    Authorization Type [place] MEDICAID [facility]    Authorization Time Period 03/14/21-05/13/21    Authorization - Visit Number 5    Authorization - Number of Visits 12    Progress Note Due on Visit 10    PT Start Time 0630    PT Stop Time 0715    PT Time Calculation (min) 45 min    Activity Tolerance Patient tolerated treatment well    Behavior During Therapy [facility] for tasks assessed/performed             Past Medical History:  Diagnosis Date   Diverticulitis    GERD (gastroesophageal reflux disease)    History of bleeding ulcers    told not to take ibuprofen   History of bronchitis 53yrs ago   Migraine    Obesity    Sleep apnea    CPAP    Past Surgical History:  Procedure Laterality Date   CESAREAN SECTION N/A 04/08/2015   Procedure: CESAREAN SECTION;  Surgeon: Catalina Antigua, MD;  Location: [facility];  Service: Obstetrics;  Laterality: N/A;   CESAREAN SECTION WITH BILATERAL TUBAL LIGATION  04/08/2015   CHOLECYSTECTOMY  03-27-14   Dr Lemar Livings   ESOPHAGOGASTRODUODENOSCOPY N/A 05/19/2017   Procedure: ESOPHAGOGASTRODUODENOSCOPY (EGD);  Surgeon: Toney Reil, MD;  Location: [facility];  Service: Endoscopy;  Laterality: N/A;   TONSILLECTOMY  03/10/2012   Procedure: TONSILLECTOMY;  Surgeon: Suzanna Obey, MD;  Location: [facility] OR;  Service: ENT;  Laterality: Bilateral;    There were no vitals filed for this visit.   Subjective Assessment - 05/13/21 1837     Subjective Pt reports that she is a bit more sore today after cleaning  extensively and taking care of her kids.  She has 7/10 R knee pain.    How long can you sit comfortably? 1 hour    How long can you stand comfortably? 10-15 minutes    How long can you walk comfortably? 30 minutes    Pain Onset More than a month ago            [facility] Adult PT Treatment/Exercise:  Therapeutic Exercise: - recumbent bike - 5' for warm up while taking subjective -  knee ext machine S/L - 2x10 @ 10# - HS curl S/L - 3x10 @ 20# - Wall squat to 60 degrees 2x10 - hip abduction machine - 2x10 @12 .5# - SLS on foam - 3x30'' - bridge - 10x10'' - Step up (not today) - SLR - 2x10  Modalities:  Vasopneumatic (Game Ready)    Location:  left knee Time:  10 minutes Pressure:  low Temperature:  36 degrees     PT Short Term Goals - 03/28/21 1320       PT SHORT TERM GOAL #1   Title Pt will be Ind in an initial HEP    Status Achieved    Target Date 03/29/21               PT Long Term Goals - 05/06/21 1913  PT LONG TERM GOAL #1   Title Pt's R hip and lnee strength will increase to 5/5 to improve the functional use of the R LE    Baseline 5/5    Status Achieved      PT LONG TERM GOAL #2   Title Pt will report a decrease in R knee pain to 4/10 or less with daily and work related activites    Baseline 6/10    Time 8    Period Weeks    Status Revised    Target Date 07/01/21      PT LONG TERM GOAL #3   Title pt will be able to walk without and antalgic gait    Baseline Antalgic gait over the R LE    Time 8    Period Weeks    Status Revised    Target Date 07/01/21      PT LONG TERM GOAL #4   Title Pt will be Ind in a final HEP to maintain or progress achieved LOF    Baseline HEP updated at re-eval    Time 8    Period Weeks    Status Revised    Target Date 07/01/21      PT LONG TERM GOAL #5   Title Improve 5XSTS to 12 sec or less as demostration of improved function of the R knee    Baseline 16 seconds    Time 8    Period Weeks    Status New     Target Date 07/01/21      PT LONG TERM GOAL #6   Title Improve to 445ft as indictation of improved function of the R knee    Baseline See flowsheet    Time 8    Period Weeks    Status Revised    Target Date 07/01/21                   Plan - 05/13/21 1858     Clinical Impression Statement Pt reports no increase in baseline pain following therapy  HEP was reviewed, but left unchanged    Overall, Robyn Ortiz is progressing well with therapy.  Today we concentrated on quad strengthening and hip strengthening.  Pt tolerates session well with fatigue but no lasting increase in sxs.  Will continue to progress as appropriate.  Pt will continue to benefit from skilled physical therapy to address remaining deficits and achieve listed goals.  Continue per POC.    Personal Factors and Comorbidities Time since onset of injury/illness/exacerbation;Comorbidity 1    Comorbidities High BMI    Examination-Activity Limitations Transfers;Locomotion Level;Carry;Lift;Stairs;Squat    Examination-Participation Restrictions Occupation;Other    Stability/Clinical Decision Making Stable/Uncomplicated    Rehab Potential Good    PT Frequency 2x / week    PT Duration 8 weeks    PT Treatment/Interventions ADLs/Self Care Home Management;Cryotherapy;Electrical Stimulation;Iontophoresis 4mg /ml Dexamethasone;Moist Heat;Ultrasound;Therapeutic exercise;Therapeutic activities;Stair training;Gait training;Functional mobility training;Patient/family education;Manual techniques;Taping;Vasopneumatic Device;Joint Manipulations;Neuromuscular re-education;Scar mobilization;Passive range of motion    PT Next Visit Plan Progress LE strengthening regimen, progress to plyometric/ endurance exercise as able    PT Home Exercise Plan [id number]    Consulted and Agree with Plan of Care Patient             Patient will benefit from skilled therapeutic intervention in order to improve the following deficits and  impairments:  Abnormal gait, Difficulty walking, Obesity, Decreased activity tolerance, Pain, Decreased strength  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  Chronic pain  of right knee  Muscle weakness (generalized)  Other abnormalities of gait and mobility  Difficulty in walking, not elsewhere classified     Problem List Patient Active Problem List   Diagnosis Date Noted   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 [place], [place], [address] Phone: [phone]   Fax:  [phone]  Name: Robyn Ortiz MRN: [id number] Date of Birth: [birth year]

## 2021-05-15 ENCOUNTER — Ambulatory Visit: Payer: Medicaid Other

## 2021-05-15 ENCOUNTER — Encounter: Payer: Self-pay | Admitting: Physical Therapy

## 2021-05-15 ENCOUNTER — Other Ambulatory Visit: Payer: Self-pay

## 2021-05-15 ENCOUNTER — Ambulatory Visit: Payer: Medicaid Other | Admitting: Physical Therapy

## 2021-05-15 DIAGNOSIS — M2241 Chondromalacia patellae, right knee: Secondary | ICD-10-CM

## 2021-05-15 DIAGNOSIS — R2689 Other abnormalities of gait and mobility: Secondary | ICD-10-CM

## 2021-05-15 DIAGNOSIS — M6281 Muscle weakness (generalized): Secondary | ICD-10-CM

## 2021-05-15 DIAGNOSIS — G8929 Other chronic pain: Secondary | ICD-10-CM

## 2021-05-15 DIAGNOSIS — R262 Difficulty in walking, not elsewhere classified: Secondary | ICD-10-CM

## 2021-05-15 NOTE — Therapy (Signed)
[facility] [address] [place], [place], [address] Phone: [phone]   Fax:  [phone]  Physical Therapy Treatment  Patient Details  Name: Robyn Ortiz MRN: [id number] Date of Birth: [birth year] Referring Provider (PT): Frederico Hamman, MD   Encounter Date: 05/15/2021   PT End of Session - 05/15/21 0836     Visit Number 6    Number of Visits 20    Date for PT Re-Evaluation 07/01/21    Authorization Type [place] MEDICAID [facility]    Authorization Time Period 03/14/21 - 07/12/21    Authorization - Visit Number 6    Authorization - Number of Visits 12    PT Start Time 0830    PT Stop Time 0925    PT Time Calculation (min) 55 min    Activity Tolerance Patient tolerated treatment well    Behavior During Therapy [facility] for tasks assessed/performed             Past Medical History:  Diagnosis Date   Diverticulitis    GERD (gastroesophageal reflux disease)    History of bleeding ulcers    told not to take ibuprofen   History of bronchitis 56yrs ago   Migraine    Obesity    Sleep apnea    CPAP    Past Surgical History:  Procedure Laterality Date   CESAREAN SECTION N/A 04/08/2015   Procedure: CESAREAN SECTION;  Surgeon: Catalina Antigua, MD;  Location: [facility];  Service: Obstetrics;  Laterality: N/A;   CESAREAN SECTION WITH BILATERAL TUBAL LIGATION  04/08/2015   CHOLECYSTECTOMY  03-27-14   Dr Lemar Livings   ESOPHAGOGASTRODUODENOSCOPY N/A 05/19/2017   Procedure: ESOPHAGOGASTRODUODENOSCOPY (EGD);  Surgeon: Toney Reil, MD;  Location: [facility];  Service: Endoscopy;  Laterality: N/A;   TONSILLECTOMY  03/10/2012   Procedure: TONSILLECTOMY;  Surgeon: Suzanna Obey, MD;  Location: [facility] OR;  Service: ENT;  Laterality: Bilateral;    There were no vitals filed for this visit.   Subjective Assessment - 05/15/21 0834     Subjective Patient reports she has good days and bad days, today is a good day but knee will progressively  get worse as the day goes on. States exercises are going good.    Patient Stated Goals To fix the issue with her R knee    Currently in Pain? Yes    Pain Score 6     Pain Location Knee    Pain Orientation Right    Pain Descriptors / Indicators Aching    Pain Type Chronic pain;Surgical pain    Pain Onset More than a month ago    Pain Frequency Constant    Aggravating Factors  standing, walking, steps                [facility] PT Assessment - 05/15/21 0001       AROM   Right Knee Extension 0   discomfort posterior and medial knee   Right Knee Flexion 128   discomfort anterior knee   Left Knee Extension 2   hyper   Left Knee Flexion 135                   [facility] Adult PT Treatment/Exercise:   Therapeutic Exercise: - Recumbent bike x 5' for warm up while taking subjective - Prone quad stretch with strap 3 x 30" - Bridge 2 x 10 with 3 sec hold - SLR 2 x 10 - Leg press (omega) single leg:  - Right 20# 2  x 10  - Left 35# 2 x 10 - Lateral band walk with green at knees 2 x 20 steps each - TRX assisted forward 6" step-up 2 x 10 (right only) - Heel raises 2 x 20 - Deadlift 25# from 6" box 2 x 10    Neuro Re-ed: - SLS on Airex 4 x 30" - first 2 sets with ball toss  Modalities:  - Vaso (Game Ready): Location:  left knee Time:  10 minutes Pressure:  medium Temperature:  34 degrees                 PT Education - 05/15/21 0835     Education Details HEP    Person(s) Educated Patient    Methods Explanation;Demonstration;Verbal cues    Comprehension Verbalized understanding;Returned demonstration;Verbal cues required;Need further instruction              PT Short Term Goals - 03/28/21 1320       PT SHORT TERM GOAL #1   Title Pt will be Ind in an initial HEP    Status Achieved    Target Date 03/29/21               PT Long Term Goals - 05/06/21 1913       PT LONG TERM GOAL #1   Title Pt's R hip and lnee strength will increase to 5/5  to improve the functional use of the R LE    Baseline 5/5    Status Achieved      PT LONG TERM GOAL #2   Title Pt will report a decrease in R knee pain to 4/10 or less with daily and work related activites    Baseline 6/10    Time 8    Period Weeks    Status Revised    Target Date 07/01/21      PT LONG TERM GOAL #3   Title pt will be able to walk without and antalgic gait    Baseline Antalgic gait over the R LE    Time 8    Period Weeks    Status Revised    Target Date 07/01/21      PT LONG TERM GOAL #4   Title Pt will be Ind in a final HEP to maintain or progress achieved LOF    Baseline HEP updated at re-eval    Time 8    Period Weeks    Status Revised    Target Date 07/01/21      PT LONG TERM GOAL #5   Title Improve 5XSTS to 12 sec or less as demostration of improved function of the R knee    Baseline 16 seconds    Time 8    Period Weeks    Status New    Target Date 07/01/21      PT LONG TERM GOAL #6   Title Improve to 458ft as indictation of improved function of the R knee    Baseline See flowsheet    Time 8    Period Weeks    Status Revised    Target Date 07/01/21                   Plan - 05/15/21 0837     Clinical Impression Statement Patient tolerated therapy well with no adverse effects. Patient demonstrates improved knee motion this visit and therapy focused on progress strength with good tolerance. She continues to have difficulty with single leg stance on unstable surface.  Patient did report slight increase in discomfort with exercises but this resolved back to baseline post therapy. No change to HEP this visit. Vaso used post therapy to address pain and swelling of right knee. Patient would benefit from continued skilled PT to progress mobility and strength in order to reduce right knee pain and maximize her functional ability.    PT Treatment/Interventions ADLs/Self Care Home Management;Cryotherapy;Electrical Stimulation;Iontophoresis 4mg /ml  Dexamethasone;Moist Heat;Ultrasound;Therapeutic exercise;Therapeutic activities;Stair training;Gait training;Functional mobility training;Patient/family education;Manual techniques;Taping;Vasopneumatic Device;Joint Manipulations;Neuromuscular re-education;Scar mobilization;Passive range of motion    PT Next Visit Plan Progress LE strengthening regimen, progress to plyometric/ endurance exercise as able    PT Home Exercise Plan [id number]    Consulted and Agree with Plan of Care Patient             Patient will benefit from skilled therapeutic intervention in order to improve the following deficits and impairments:  Abnormal gait, Difficulty walking, Obesity, Decreased activity tolerance, Pain, Decreased strength  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  Chronic pain of right knee  Muscle weakness (generalized)  Other abnormalities of gait and mobility  Difficulty in walking, not elsewhere classified     Problem List Patient Active Problem List   Diagnosis Date Noted   Postpartum depression 04/23/2015   Eczema 09/04/2014   Abnormal quad screen 03/03/2013   Obesity (BMI 30-39.9) 11/21/2012   Asthma 11/21/2012   Current smoker 11/21/2012   Condyloma acuminatum of perianal region 11/21/2012   Migraine 11/21/2012    01/21/2013, PT, DPT, LAT, ATC 05/15/21  9:21 AM Phone: [phone] Fax: [phone]   [facility] [address] [place], [place], [place] Phone: [phone]   Fax:  [phone]  Name: Robyn Ortiz MRN: [name] Date of Birth: 1986/01/14

## 2021-05-19 ENCOUNTER — Ambulatory Visit: Payer: Medicaid Other | Admitting: Gastroenterology

## 2021-05-19 ENCOUNTER — Other Ambulatory Visit: Payer: Self-pay

## 2021-05-19 ENCOUNTER — Encounter: Payer: Self-pay | Admitting: Gastroenterology

## 2021-05-19 VITALS — BP 96/70 | HR 80 | Temp 98.1°F | Wt 227.0 lb

## 2021-05-19 DIAGNOSIS — K529 Noninfective gastroenteritis and colitis, unspecified: Secondary | ICD-10-CM | POA: Diagnosis not present

## 2021-05-19 DIAGNOSIS — K9089 Other intestinal malabsorption: Secondary | ICD-10-CM

## 2021-05-19 MED ORDER — CHOLESTYRAMINE 4 G PO PACK: 1.0000 | PACK | Freq: Two times a day (BID) | ORAL | 5 refills | Status: DC

## 2021-05-19 NOTE — Patient Instructions (Signed)
Lactose-Free Diet, Adult If you have lactose intolerance, you are not able to digest lactose. Lactose is a natural sugar found mainly in dairy milk and dairy products. A lactose-freediet can help you avoid foods and beverages that contain lactose. What are tips for following this plan? Reading food labels Do not consume foods, beverages, vitamins, minerals, or medicines containing lactose. Read ingredient lists carefully. Look for the words "lactose-free" on labels. Meal planning Use alternatives to dairy milk and foods made with milk products. These include the following: Lactose-free milk. Soy milk with added calcium and vitamin D. Almond milk, coconut milk, rice milk, or other nondairy milk alternatives with added calcium and vitamin D. Note that a lot of these are low in protein. Soy products, such as soy yogurt, soy cheese, soy ice cream, and soy-based sour cream. Other nut milk products, such as almond yogurt, almond cheese, cashew yogurt, cashew cheese, cashew ice cream, coconut yogurt, and coconut ice cream. Medicines, vitamins, and supplements Use lactase enzyme drops or tablets as directed by your health care provider. Make sure you get enough calcium and vitamin D in your diet. A lactose-free eating plan can be lacking in these important nutrients. Take calcium and vitamin D supplements as directed by your health care provider. Talk with your health care provider about supplements if you are not able to get enough calcium and vitamin D from food. What foods should I eat?  Fruits All fresh, canned, frozen, or dried fruits and fruit juices that are notprocessed with lactose. Vegetables All fresh, frozen, and canned vegetables without cheese, cream, or buttersauces. Grains Any that are not made with dairy milk or dairy products. Meats and other proteins Any meat, fish, poultry, and other protein sources that are not made with dairymilk or dairy products. Fats and oils Any that are  not made with dairy milk or dairy products. Sweets and desserts Any that are not made with dairy milk or dairy products. Seasonings and condiments Any that are not made with dairy milk or dairy products. Calcium Calcium is found in many foods that contain lactose and is important for bone health. The amount of calcium you need depends on your age: Adults younger than 50 years: 1,000 mg of calcium a day. Adults older than 50 years: 1,200 mg of calcium a day. If you are not getting enough calcium, you may get it from other sources, including: Orange juice that has been fortified with calcium. This means that calcium has been added to the product. There are 300-350 mg of calcium in 1 cup (237 mL) of calcium-fortified orange juice. Soy milk fortified with calcium. There are 300-400 mg of calcium in 1 cup (237 mL) of calcium-fortified soy milk. Rice or almond milk fortified with calcium. There are 300 mg of calcium in 1 cup (237 mL) of calcium-fortified rice or almond milk. Breakfast cereals fortified with calcium. There are 100-1,000 mg of calcium in calcium-fortified breakfast cereals. Spinach, cooked. There are 145 mg of calcium in  cup (90 g) of cooked spinach. Edamame, cooked. There are 130 mg of calcium in  cup (47 g) of cooked edamame. Collard greens, cooked. There are 125 mg of calcium in  cup (85 g) of cooked collard greens. Kale, frozen or cooked. There are 90 mg of calcium in  cup (59 g) of cooked or frozen kale. Almonds. There are 95 mg of calcium in  cup (35 g) of almonds. Broccoli, cooked. There are 60 mg of calcium in 1 cup (156   g) of cooked broccoli. The items listed above may not be a complete list of foods and beverages you can eat. Contact a dietitian for more options. What foods should I avoid? Lactose is found in dairy milk and dairy products, such as: Yogurt. Cheese. Butter. Margarine. Sour cream. Cream. Whipped toppings and creamers. Ice cream and other  dairy-based desserts. Lactose is also found in foods or products made with dairy milk or milk ingredients. To find out whether a food contains dairy milk or a milk ingredient, look at the ingredients list. Avoid foods with the statement "May contain milk" and foods that contain: Milk powder. Whey. Curd. Lactose. Lactoglobulin. The items listed above may not be a complete list of foods and beverages to avoid. Contact a dietitian for more information. Where to find more information National Institute of Diabetes and Digestive and Kidney Diseases: www.niddk.nih.gov Summary If you are lactose intolerant, it means that you are not able to digest lactose, a natural sugar found in milk and milk products. Following a lactose-free diet can help you manage this condition. Calcium is important for bone health and is found in many foods that contain lactose. Talk with your health care provider about other sources of calcium. This information is not intended to replace advice given to you by your health care provider. Make sure you discuss any questions you have with your healthcare provider. Document Revised: 07/09/2020 Document Reviewed: 07/09/2020 Elsevier Patient Education  2022 [employer]  

## 2021-05-19 NOTE — Progress Notes (Signed)
Arlyss Repress, MD [address]  [address]  [place], [place] [address]  Main: [phone]  Fax: [phone]    Gastroenterology Consultation  Referring Provider:     Armando Gang, FNP Primary Care Physician:  Armando Gang, FNP Primary Gastroenterologist:  Dr. Arlyss Repress Reason for Consultation:   Chronic diarrhea        HPI:   Robyn Ortiz is a 35 y.o. female referred by Dr. Armando Gang, FNP  for consultation & management of Epigastric pain and right upper quadrant pain, sharp knot-like sensation worse after eating associated with nausea. She has been having these symptoms for at least 40 years but got worse after her last child was born 2 years ago. She has been taking ibuprofen 600 mg twice daily both for abdominal pain and headache. She has been gaining weight, denies loss of appetite, melena, rectal bleeding, trouble swallowing. She is also suffering from nonbloody diarrhea, after each meal since she had a cholecystectomy. She denies hematochezia, blood mixed with stool, lower abdominal pain, urgency, constipation. She had gallbladder removal for her upper abdominal symptoms, however her symptoms continued. She smokes daily but does not drink alcohol. She takes Protonix 40 mg daily.   Follow-up visit 10/06/2017: Patient underwent EGD which was unremarkable. There was no evidence of H. pylori infection. She tried cholestyramine which resulted in constipation. She is taking Protonix as needed for heartburn. She continues to have epigastric pain, abdominal cramps and frequent bowel movements with bloating. She drinks sodas, one can a day. She consumes red meat daily. She has gained weight since switching her job. She continues to smoke cigarettes. She is with her husband today. Her CBC and ferritin were normal. Celiac serologies were negative. She does have chronic history of headaches and takes BC powder as needed.  Follow-up visit 12/08/2017 She lost about  15 pounds intentionally since last visit the following healthy diet. She continues to have increased bowel frequency associated with abdominal cramps. She is taking cholestyramine daily and does not take on Saturday which results in severe diarrhea. She does not have a bowel movement while on cholestyramine. She is on amitriptyline 25 mg at bedtime. She tried FD guard which did not help. She continues to smoke.   She takes care of 6 kids, 3 are adopted  Follow-up visit 05/19/2021 Patient has not seen me in 3 years.  She is here because of recurrence of diarrhea.  She reports that her diarrhea has recurred for more than a year after she ran out of cholestyramine that she was taking before.  She stopped amitriptyline.  She underwent work-up of chronic diarrhea including stool studies to rule out infection which was negative, celiac panel was negative, C. difficile was negative, no evidence of iron deficiency.  Patient reports that she has about 4-5 episodes of watery bowel movements daily.  She reports that even water runs through her.  She stopped drinking sodas.  She likes to eat cheese.  She denies any weight loss.  No evidence of anemia.  She has occasional diarrhea at night as well.  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  She does smoke half pack per day   She denies family history of GI malignancy GI Procedures: EGD 06/05/2017 normal   Past Medical History:  Diagnosis Date   Diverticulitis    GERD (gastroesophageal reflux disease)    History of bleeding ulcers  told not to take ibuprofen   History of bronchitis 21yrs ago   Migraine    Obesity    Sleep apnea    CPAP    Past Surgical History:  Procedure Laterality Date   CESAREAN SECTION N/A 04/08/2015   Procedure: CESAREAN SECTION;  Surgeon: Catalina Antigua, MD;  Location: [facility];  Service: Obstetrics;  Laterality: N/A;   CESAREAN SECTION WITH BILATERAL TUBAL LIGATION  04/08/2015    CHOLECYSTECTOMY  03-27-14   Dr Lemar Livings   ESOPHAGOGASTRODUODENOSCOPY N/A 05/19/2017   Procedure: ESOPHAGOGASTRODUODENOSCOPY (EGD);  Surgeon: Toney Reil, MD;  Location: [facility];  Service: Endoscopy;  Laterality: N/A;   TONSILLECTOMY  03/10/2012   Procedure: TONSILLECTOMY;  Surgeon: Suzanna Obey, MD;  Location: [facility] OR;  Service: ENT;  Laterality: Bilateral;     Current Outpatient Medications:    acetaminophen (TYLENOL) 325 MG tablet, Take 650 mg by mouth every 6 (six) hours as needed for moderate pain., Disp: , Rfl:    amoxicillin-clavulanate (AUGMENTIN) 875-125 MG tablet, Take 1 tablet by mouth every 12 (twelve) hours for 10 days., Disp: 20 tablet, Rfl: 0   ASPIRIN LOW DOSE 81 MG EC tablet, PLEASE SEE ATTACHED FOR DETAILED DIRECTIONS, Disp: , Rfl:    celecoxib (CELEBREX) 200 MG capsule, Take by mouth., Disp: , Rfl:    cetirizine (ZYRTEC) 10 MG tablet, Take 10 mg by mouth daily., Disp: , Rfl: 5   cholestyramine (QUESTRAN) 4 g packet, Take 1 packet by mouth 2 (two) times daily., Disp: 60 each, Rfl: 5   cyclobenzaprine (FLEXERIL) 10 MG tablet, Take 1 tablet (10 mg total) by mouth 2 (two) times daily as needed for muscle spasms., Disp: 20 tablet, Rfl: 0   NATURAL VITAMIN D-3 125 MCG (5000 UT) TABS, Take 1 tablet by mouth daily., Disp: , Rfl:    NURTEC 75 MG TBDP, Take 1 tablet by mouth every other day as needed., Disp: , Rfl:    PROAIR HFA 108 (90 Base) MCG/ACT inhaler, Inhale into the lungs., Disp: , Rfl:    SPIRIVA RESPIMAT 1.25 MCG/ACT AERS, SMARTSIG:2 Puff(s) By Mouth Daily, Disp: , Rfl:    SYMBICORT 160-4.5 MCG/ACT inhaler, SMARTSIG:2 Puff(s) By Mouth Twice Daily, Disp: , Rfl:    valACYclovir (VALTREX) 1000 MG tablet, Take 4,000 mg by mouth once., Disp: , Rfl:    cyclobenzaprine (FLEXERIL) 10 MG tablet, Take 1 tablet by mouth twice daily as needed for muscle spasms (Patient not taking: Reported on 05/19/2021), Disp: 20 tablet, Rfl: 0   dicyclomine (BENTYL) 10 MG capsule, Take 1  capsule (10 mg total) by mouth 4 (four) times daily -  before meals and at bedtime., Disp: 30 capsule, Rfl: 0   Family History  Problem Relation Age of Onset   Diabetes Father    Bipolar disorder Mother    Heart disease Paternal Grandmother    Cancer Paternal Grandfather        intestional cancer   Other Paternal Grandfather        deteriorating disc     Social History   Tobacco Use   Smoking status: Every Day    Packs/day: 1.00    Years: 13.00    Pack years: 13.00    Types: Cigarettes   Smokeless tobacco: Never  Vaping Use   Vaping Use: Never used  Substance Use Topics   Alcohol use: Yes    Comment: beer occasionally   Drug use: No    Allergies as of 05/19/2021 - Review Complete 05/19/2021  Allergen  Reaction Noted   Codeine Other (See Comments) 01/26/2012   Ibuprofen  11/09/2012    Review of Systems:    All systems reviewed and negative except where noted in HPI.   Physical Exam:  BP 96/70 (BP Location: Left Arm, Patient Position: Sitting, Cuff Size: Large)   Pulse 80   Temp 98.1 F (36.7 C) (Oral)   Wt 103 kg   LMP 04/21/2021 (Approximate)   BMI 37.77 kg/m  Patient's last menstrual period was 04/21/2021 (approximate).  General:   Alert,  Well-developed, well-nourished, pleasant and cooperative in NAD Head:  Normocephalic and atraumatic. Eyes:  Sclera clear, no icterus.   Conjunctiva pink. Ears:  Normal auditory acuity. Nose:  No deformity, discharge, or lesions. Mouth:  No deformity or lesions,oropharynx pink & moist. Neck:  Supple; no masses or thyromegaly. Lungs:  Respirations even and unlabored.  Clear throughout to auscultation.   No wheezes, crackles, or rhonchi. No acute distress. Heart:  Regular rate and rhythm; no murmurs, clicks, rubs, or gallops. Abdomen:  Normal bowel sounds.  No bruits.  Soft, non-tender and non-distended without masses, hepatosplenomegaly or hernias noted.  No guarding or rebound tenderness.   Rectal: Nor performed Msk:   Symmetrical without gross deformities. Good, equal movement & strength bilaterally. Pulses:  Normal pulses noted. Extremities:  No clubbing or edema.  No cyanosis. Neurologic:  Alert and oriented x3;  grossly normal neurologically. Skin:  Intact without significant lesions or rashes. No jaundice. Psych:  Alert and cooperative. Normal mood and affect.  Imaging Studies: Reviewed  Assessment and Plan:   Robyn Ortiz is a 35 y.o. female status postcholecystectomy with Chronic symptoms of nonbloody post prandial diarrhea without any weight loss.   Chronic diarrhea, s/p cholecystectomy EGD with gastric biopsies unremarkable stool for C. Difficile and other GI pathogens negative TTG IgA, ferritin, CBC normal Check pancreatic fecal elastase levels given her smoking history Check fecal calprotectin levels Restart cholestyramine 1 to 2 packets daily Discussed about 2 to 3 weeks of strict lactose-free diet If above work-up is negative, recommend colonoscopy with TI evaluation and random colon biopsies Encouraged her to quit smoking Will do empiric trial of rifaximin 550 mg 3 times daily if above work-up is negative  Follow up in 6 months or sooner if needed  Asked her to contact me via my chart as needed  Arlyss Repress, MD

## 2021-05-20 ENCOUNTER — Ambulatory Visit: Payer: Medicaid Other | Attending: Orthopedic Surgery | Admitting: Physical Therapy

## 2021-05-20 ENCOUNTER — Encounter: Payer: Self-pay | Admitting: Physical Therapy

## 2021-05-20 DIAGNOSIS — M2241 Chondromalacia patellae, right knee: Secondary | ICD-10-CM | POA: Diagnosis not present

## 2021-05-20 DIAGNOSIS — G8929 Other chronic pain: Secondary | ICD-10-CM | POA: Diagnosis present

## 2021-05-20 DIAGNOSIS — M25561 Pain in right knee: Secondary | ICD-10-CM | POA: Diagnosis present

## 2021-05-20 DIAGNOSIS — R262 Difficulty in walking, not elsewhere classified: Secondary | ICD-10-CM | POA: Diagnosis present

## 2021-05-20 DIAGNOSIS — M6281 Muscle weakness (generalized): Secondary | ICD-10-CM | POA: Insufficient documentation

## 2021-05-20 DIAGNOSIS — R2689 Other abnormalities of gait and mobility: Secondary | ICD-10-CM | POA: Insufficient documentation

## 2021-05-20 NOTE — Therapy (Signed)
[facility] [address] [place], [place], [address] Phone: [phone]   Fax:  [phone]  Physical Therapy Treatment  Patient Details  Name: Robyn Ortiz MRN: [id number] Date of Birth: [birth year] Referring Provider (PT): Frederico Hamman, MD   Encounter Date: 05/20/2021   PT End of Session - 05/20/21 0851     Visit Number 7    Number of Visits 20    Date for PT Re-Evaluation 07/01/21    Authorization Type [place] MEDICAID [facility]    Authorization Time Period 03/14/21 - 07/12/21    Authorization - Visit Number 7    Authorization - Number of Visits 12    Progress Note Due on Visit 10    PT Start Time 0847    PT Stop Time 0930    PT Time Calculation (min) 43 min             Past Medical History:  Diagnosis Date   Diverticulitis    GERD (gastroesophageal reflux disease)    History of bleeding ulcers    told not to take ibuprofen   History of bronchitis 42yrs ago   Migraine    Obesity    Sleep apnea    CPAP    Past Surgical History:  Procedure Laterality Date   CESAREAN SECTION N/A 04/08/2015   Procedure: CESAREAN SECTION;  Surgeon: Catalina Antigua, MD;  Location: [facility];  Service: Obstetrics;  Laterality: N/A;   CESAREAN SECTION WITH BILATERAL TUBAL LIGATION  04/08/2015   CHOLECYSTECTOMY  03-27-14   Dr Lemar Livings   ESOPHAGOGASTRODUODENOSCOPY N/A 05/19/2017   Procedure: ESOPHAGOGASTRODUODENOSCOPY (EGD);  Surgeon: Toney Reil, MD;  Location: [facility];  Service: Endoscopy;  Laterality: N/A;   TONSILLECTOMY  03/10/2012   Procedure: TONSILLECTOMY;  Surgeon: Suzanna Obey, MD;  Location: [facility] OR;  Service: ENT;  Laterality: Bilateral;    There were no vitals filed for this visit.   Subjective Assessment - 05/20/21 0848     Subjective Achey at rest , 6.5/10 pain with flexing while walking. My knee locked while I was in bed and it would not straighten for 10 minutes. Personally I do not think it is better.     Currently in Pain? Yes    Pain Score 6     Pain Location Knee    Pain Orientation Right;Anterior;Medial    Pain Descriptors / Indicators Aching    Pain Type Chronic pain    Aggravating Factors  walking    Pain Relieving Factors ice, rest , meds                [facility] PT Assessment - 05/20/21 0001       AROM   Right Knee Flexion 135              [facility] Adult PT Treatment/Exercise:   Therapeutic Exercise: - Recumbent bike x 5' L3  - OMEGA knee extension 10# concentric and eccentric on LLE x 25 -OMEGA knee flexion 25# concentric and eccentric on LLE x 25  - Leg press (omega)              - Right 25# 2 x 10             - bilat 45# 2 x 10 -4 inch lateral step up x 20  -4 inch forward step up x 10 , 6 inch x 20 - Heel raises 1 x 20 - slant board stretch x 60 sec  - Prone quad stretch with strap  3 x 30" - Bridge 1 x 10 with 10sec hold - SLR 2 x 10   Neuro Re-ed: - SLS with rebounder 2-3 toss best -SLS with ABC    Modalities:       PT Short Term Goals - 05/20/21 1039       PT SHORT TERM GOAL #1   Title Pt will be Ind in an initial HEP    Period Weeks    Status Achieved    Target Date 03/29/21      PT SHORT TERM GOAL #2   Title Pt will report understanding of measures to assist in the reduction of R knee pain.    Period Weeks    Status Achieved    Target Date 03/29/21               PT Long Term Goals - 05/20/21 0849       PT LONG TERM GOAL #1   Title Pt's R hip and lnee strength will increase to 5/5 to improve the functional use of the R LE    Baseline 5/5    Status Achieved      PT LONG TERM GOAL #2   Title Pt will report a decrease in R knee pain to 4/10 or less with daily and work related activites    Baseline 6/10    Time 8    Period Weeks    Status On-going      PT LONG TERM GOAL #3   Title pt will be able to walk without and antalgic gait    Baseline Antalgic gait over the R LE    Time 8    Period Weeks    Status On-going       PT LONG TERM GOAL #4   Title Pt will be Ind in a final HEP to maintain or progress achieved LOF    Baseline HEP updated at re-eval    Time 8    Period Weeks    Status On-going      PT LONG TERM GOAL #5   Title Improve 5XSTS to 12 sec or less as demostration of improved function of the R knee    Baseline 16 seconds    Time 8    Period Weeks    Status On-going      PT LONG TERM GOAL #6   Title Improve to 429ft as indictation of improved function of the R knee    Time 8    Period Weeks    Status On-going                   Plan - 05/20/21 1031     Clinical Impression Statement Pt reports limited improvement thus far in her right knee pain and function. She reports continued difficulty in stair climbing. Her AROM right knee has improved. Contined with knee strengthening and ROM. Today, she was able to complete 4 inch and 6 inch step ups without increased knee pain. Continued with dynamic SLS which is challenging for her. Updated HEP with SLS exercise. She declined ice at end of session today.    PT Treatment/Interventions ADLs/Self Care Home Management;Cryotherapy;Electrical Stimulation;Iontophoresis 4mg /ml Dexamethasone;Moist Heat;Ultrasound;Therapeutic exercise;Therapeutic activities;Stair training;Gait training;Functional mobility training;Patient/family education;Manual techniques;Taping;Vasopneumatic Device;Joint Manipulations;Neuromuscular re-education;Scar mobilization;Passive range of motion    PT Next Visit Plan Progress LE strengthening regimen, SLS progression ,step exercises, TRX,   progress to plyometric/ endurance exercise as able    PT Home Exercise Plan [id number]  Patient will benefit from skilled therapeutic intervention in order to improve the following deficits and impairments:  Abnormal gait, Difficulty walking, Obesity, Decreased activity tolerance, Pain, Decreased strength  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  Muscle weakness (generalized)  Other abnormalities of gait and mobility  Difficulty in walking, not elsewhere classified  Chronic pain of right knee     Problem List Patient Active Problem List   Diagnosis Date Noted   Postpartum 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

## 2021-05-22 ENCOUNTER — Ambulatory Visit: Payer: Medicaid Other | Admitting: Physical Therapy

## 2021-05-27 ENCOUNTER — Ambulatory Visit: Payer: Medicaid Other

## 2021-05-27 ENCOUNTER — Other Ambulatory Visit: Payer: Self-pay

## 2021-05-27 DIAGNOSIS — G8929 Other chronic pain: Secondary | ICD-10-CM

## 2021-05-27 DIAGNOSIS — M2241 Chondromalacia patellae, right knee: Secondary | ICD-10-CM | POA: Diagnosis not present

## 2021-05-27 DIAGNOSIS — R2689 Other abnormalities of gait and mobility: Secondary | ICD-10-CM

## 2021-05-27 DIAGNOSIS — R262 Difficulty in walking, not elsewhere classified: Secondary | ICD-10-CM

## 2021-05-27 NOTE — Therapy (Signed)
[facility] [address] [place], [place], [address] Phone: [phone]   Fax:  [phone]  Physical Therapy Treatment  Patient Details  Name: Robyn Ortiz MRN: [id number] Date of Birth: Dec 21, 1985 Referring Provider (PT): Frederico Hamman, MD   Encounter Date: 05/27/2021   PT End of Session - 05/27/21 0723     Visit Number 8    Number of Visits 20    Date for PT Re-Evaluation 07/01/21    Authorization Type [place] MEDICAID [facility]    Authorization Time Period 03/14/21 - 07/12/21    Authorization - Visit Number 8    Authorization - Number of Visits 12    Progress Note Due on Visit 10    PT Start Time 0817    PT Stop Time 0859    PT Time Calculation (min) 42 min    Activity Tolerance Patient tolerated treatment well    Behavior During Therapy [facility] for tasks assessed/performed             Past Medical History:  Diagnosis Date   Diverticulitis    GERD (gastroesophageal reflux disease)    History of bleeding ulcers    told not to take ibuprofen   History of bronchitis 43yrs ago   Migraine    Obesity    Sleep apnea    CPAP    Past Surgical History:  Procedure Laterality Date   CESAREAN SECTION N/A 04/08/2015   Procedure: CESAREAN SECTION;  Surgeon: Catalina Antigua, MD;  Location: [facility];  Service: Obstetrics;  Laterality: N/A;   CESAREAN SECTION WITH BILATERAL TUBAL LIGATION  04/08/2015   CHOLECYSTECTOMY  03-27-14   Dr Lemar Livings   ESOPHAGOGASTRODUODENOSCOPY N/A 05/19/2017   Procedure: ESOPHAGOGASTRODUODENOSCOPY (EGD);  Surgeon: Toney Reil, MD;  Location: [facility];  Service: Endoscopy;  Laterality: N/A;   TONSILLECTOMY  03/10/2012   Procedure: TONSILLECTOMY;  Surgeon: Suzanna Obey, MD;  Location: [facility] OR;  Service: ENT;  Laterality: Bilateral;    There were no vitals filed for this visit.   Subjective Assessment - 05/27/21 0720     Subjective Steps are a little easier. My knee still hurts.    Patient  Stated Goals To fix the issue with her R knee    Currently in Pain? Yes    Pain Score 7     Pain Location Knee    Pain Orientation Right;Anterior;Medial    Pain Descriptors / Indicators Aching    Pain Type Chronic pain    Pain Onset More than a month ago    Pain Frequency Constant    Aggravating Factors  walking    Pain Relieving Factors ice, resat, meds             [facility] Adult PT Treatment/Exercise:   Therapeutic Exercise: - Recumbent bike x 5' L3  - OMEGA knee extension 10# concentric and eccentric on LLE 2x15 -OMEGA knee flexion 25# concentric and eccentric on LLE x 2x15  - Leg press (omega)              - Right 25# 2 x 10             - bilat 45# 2 x 10 -4 inch lateral step up x 20  -4 inch forward step up x 10 , 6 inch x 20 - Heel raises 1 x 20 - slant board stretch x 60 sec  - Prone quad stretch with strap 3 x 30" - Supine hamstring with strap 3 x 30" - Bridge  1 x 10 with 10 sec hold - SLR 2 x 15   Neuro Re-ed: - SLS with rebounder, 15x toss best - SLS with ABC    Modalities:    PT Short Term Goals - 05/20/21 1039       PT SHORT TERM GOAL #1   Title Pt will be Ind in an initial HEP    Period Weeks    Status Achieved    Target Date 03/29/21      PT SHORT TERM GOAL #2   Title Pt will report understanding of measures to assist in the reduction of R knee pain.    Period Weeks    Status Achieved    Target Date 03/29/21               PT Long Term Goals - 05/20/21 0849       PT LONG TERM GOAL #1   Title Pt's R hip and lnee strength will increase to 5/5 to improve the functional use of the R LE    Baseline 5/5    Status Achieved      PT LONG TERM GOAL #2   Title Pt will report a decrease in R knee pain to 4/10 or less with daily and work related activites    Baseline 6/10    Time 8    Period Weeks    Status On-going      PT LONG TERM GOAL #3   Title pt will be able to walk without and antalgic gait    Baseline Antalgic gait over the R LE     Time 8    Period Weeks    Status On-going      PT LONG TERM GOAL #4   Title Pt will be Ind in a final HEP to maintain or progress achieved LOF    Baseline HEP updated at re-eval    Time 8    Period Weeks    Status On-going      PT LONG TERM GOAL #5   Title Improve 5XSTS to 12 sec or less as demostration of improved function of the R knee    Baseline 16 seconds    Time 8    Period Weeks    Status On-going      PT LONG TERM GOAL #6   Title Improve to 448ft as indictation of improved function of the R knee    Time 8    Period Weeks    Status On-going                      Plan - 05/27/21 0724     Clinical Impression Statement PT was continued to address R knee pain, strength, and function with both CKC and OKC exs. Today, pt demonstrated improved balance/stability c SLS ball toss with rebounder, improving to 15 tosses for her best attempt. pt notes she has been working on her SLS/balance at home. Pt tolerated today's PT session without adverse effects.    Personal Factors and Comorbidities Time since onset of injury/illness/exacerbation;Comorbidity 1    Comorbidities High BMI    Examination-Activity Limitations Transfers;Locomotion Level;Carry;Lift;Stairs;Squat    Examination-Participation Restrictions Occupation;Other    Stability/Clinical Decision Making Stable/Uncomplicated    Clinical Decision Making Low    Rehab Potential Good    PT Frequency 2x / week    PT Duration 8 weeks    PT Treatment/Interventions ADLs/Self Care Home Management;Cryotherapy;Electrical Stimulation;Iontophoresis 4mg /ml Dexamethasone;Moist Heat;Ultrasound;Therapeutic exercise;Therapeutic activities;Stair training;Gait training;Functional  mobility training;Patient/family education;Manual techniques;Taping;Vasopneumatic Device;Joint Manipulations;Neuromuscular re-education;Scar mobilization;Passive range of motion    PT Next Visit Plan Progress LE strengthening regimen, SLS progression ,step  exercises, TRX,   progress to plyometric/ endurance exercise as able    PT Home Exercise Plan [id number]    Consulted and Agree with Plan of Care Patient             Patient will benefit from skilled therapeutic intervention in order to improve the following deficits and impairments:  Abnormal gait, Difficulty walking, Obesity, Decreased activity tolerance, Pain, Decreased strength  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  Other abnormalities of gait and mobility  Difficulty in walking, not elsewhere classified  Chronic pain of right knee     Problem List Patient Active Problem List   Diagnosis Date Noted   Postpartum depression 04/23/2015   Eczema 09/04/2014   Abnormal quad screen 03/03/2013   Obesity (BMI 30-39.9) 11/21/2012   Asthma 11/21/2012   Current smoker 11/21/2012   Condyloma acuminatum of perianal region 11/21/2012   Migraine 11/21/2012    Joellyn Rued MS, PT 05/27/21 8:16 AM   [facility] [address] [place], [place], [address] Phone: [phone]   Fax:  [phone]  Name: Mckaylee Dimalanta MRN: [id number] Date of Birth: 12-20-1985

## 2021-05-29 ENCOUNTER — Ambulatory Visit: Payer: Medicaid Other

## 2021-05-29 ENCOUNTER — Other Ambulatory Visit: Payer: Self-pay

## 2021-05-29 DIAGNOSIS — K529 Noninfective gastroenteritis and colitis, unspecified: Secondary | ICD-10-CM

## 2021-05-29 LAB — PANCREATIC ELASTASE, FECAL: Pancreatic Elastase, Fecal: 491 ug Elast./g (ref >200)

## 2021-05-29 LAB — CALPROTECTIN, FECAL: Calprotectin, Fecal: 267 ug/g — ABNORMAL HIGH (ref 0–120)

## 2021-05-29 NOTE — Progress Notes (Signed)
Called patient with results she understands and ordered new lab and gave her the information

## 2021-06-03 ENCOUNTER — Ambulatory Visit: Payer: Medicaid Other

## 2021-06-03 ENCOUNTER — Other Ambulatory Visit: Payer: Self-pay

## 2021-06-03 DIAGNOSIS — R262 Difficulty in walking, not elsewhere classified: Secondary | ICD-10-CM

## 2021-06-03 DIAGNOSIS — R2689 Other abnormalities of gait and mobility: Secondary | ICD-10-CM

## 2021-06-03 DIAGNOSIS — G8929 Other chronic pain: Secondary | ICD-10-CM

## 2021-06-03 DIAGNOSIS — M2241 Chondromalacia patellae, right knee: Secondary | ICD-10-CM | POA: Diagnosis not present

## 2021-06-03 DIAGNOSIS — M6281 Muscle weakness (generalized): Secondary | ICD-10-CM

## 2021-06-03 NOTE — Therapy (Addendum)
[facility]patient Rehabilitation [facility] [address] [place], [place], [address] Phone: [phone]   Fax:  [phone]  Physical Therapy Treatment  Patient Details  Name: Robyn Ortiz MRN: [id number] Date of Birth: [birth year] Referring Provider (PT): Frederico Hamman, MD   Encounter Date: 06/03/2021   PT End of Session - 06/03/21 0811     Visit Number 9    Number of Visits 20    Date for PT Re-Evaluation 07/01/21    Authorization Type [place] MEDICAID [employer]    Authorization Time Period 03/14/21 - 07/12/21    Authorization - Visit Number 9    Authorization - Number of Visits 12    Progress Note Due on Visit 10    PT Start Time 0805    PT Stop Time 0905    PT Time Calculation (min) 60 min    Activity Tolerance Patient tolerated treatment well    Behavior During Therapy [facility] for tasks assessed/performed             Past Medical History:  Diagnosis Date   Diverticulitis    GERD (gastroesophageal reflux disease)    History of bleeding ulcers    told not to take ibuprofen   History of bronchitis 12yrs ago   Migraine    Obesity    Sleep apnea    CPAP    Past Surgical History:  Procedure Laterality Date   CESAREAN SECTION N/A 04/08/2015   Procedure: CESAREAN SECTION;  Surgeon: Catalina Antigua, MD;  Location: [facility];  Service: Obstetrics;  Laterality: N/A;   CESAREAN SECTION WITH BILATERAL TUBAL LIGATION  04/08/2015   CHOLECYSTECTOMY  03-27-14   Dr Lemar Livings   ESOPHAGOGASTRODUODENOSCOPY N/A 05/19/2017   Procedure: ESOPHAGOGASTRODUODENOSCOPY (EGD);  Surgeon: Toney Reil, MD;  Location: [facility];  Service: Endoscopy;  Laterality: N/A;   TONSILLECTOMY  03/10/2012   Procedure: TONSILLECTOMY;  Surgeon: Suzanna Obey, MD;  Location: [facility] OR;  Service: ENT;  Laterality: Bilateral;    There were no vitals filed for this visit.   Subjective Assessment - 06/03/21 0816     Subjective Pt reports R knee is not as achey. Today on the recumbent  bike, Pt reported sharp L knee pain with the 1st revolution. The distance from the pedals was adjusted and reported less pain. Pt continued to report anterior L knee pain with movement at the end of the session.    Patient is accompained by: Family member    Currently in Pain? Yes    Pain Score 5     Pain Location Knee    Pain Orientation Right;Anterior;Medial    Pain Descriptors / Indicators Aching    Pain Type Chronic pain    Pain Onset More than a month ago    Pain Frequency Constant    Aggravating Factors  Prolonged time in her feet    Pain Relieving Factors ice, rest, meds    Multiple Pain Sites Yes    Pain Score 8    Pain Location Knee    Pain Orientation Left;Anterior    Pain Descriptors / Indicators Sharp    Pain Type Acute pain    Pain Onset In the past 7 days    Pain Frequency Intermittent              [facility] Adult PT Treatment/Exercise:   Therapeutic Exercise: - Recumbent bike x 5' L1 - OMEGA knee extension 10# concentric and eccentric on RLE x 3x10 -OMEGA knee flexion 15# concentric and eccentric on RLE  x 3x10  - Leg press (omega)              - Right 25# 2 x 10 -4 inch lateral step up x 20  -4 inch forward step up x 20 - Heel raises 1 x 20 - slant board stretch x 60 sec  - Prone quad stretch with strap 3 x 30" - Supine hamstring with strap 3 x 30" - SLR 2 x 20   Modalities:  - Cold packs to both knees, 15 mins  Neuro Re-ed:  Not completed today: - Bridge 1 x 10 with 10 sec hold - SLS with rebounder, 15x toss best - SLS with ABC           PT Education - 06/03/21 0853     Education Details Pt reports using a cold pack for her Rt knee 3 to 4x daily. Pt was advised to use a cold pack with the L knee as well and to contact Dr. Madelon Lips if her L knee pain persists.    Person(s) Educated Patient    Methods Explanation    Comprehension Verbalized understanding              PT Short Term Goals - 05/20/21 1039       PT SHORT TERM GOAL #1    Title Pt will be Ind in an initial HEP    Period Weeks    Status Achieved    Target Date 03/29/21      PT SHORT TERM GOAL #2   Title Pt will report understanding of measures to assist in the reduction of R knee pain.    Period Weeks    Status Achieved    Target Date 03/29/21               PT Long Term Goals - 05/20/21 0849       PT LONG TERM GOAL #1   Title Pt's R hip and lnee strength will increase to 5/5 to improve the functional use of the R LE    Baseline 5/5    Status Achieved      PT LONG TERM GOAL #2   Title Pt will report a decrease in R knee pain to 4/10 or less with daily and work related activites    Baseline 6/10    Time 8    Period Weeks    Status On-going      PT LONG TERM GOAL #3   Title pt will be able to walk without and antalgic gait    Baseline Antalgic gait over the R LE    Time 8    Period Weeks    Status On-going      PT LONG TERM GOAL #4   Title Pt will be Ind in a final HEP to maintain or progress achieved LOF    Baseline HEP updated at re-eval    Time 8    Period Weeks    Status On-going      PT LONG TERM GOAL #5   Title Improve 5XSTS to 12 sec or less as demostration of improved function of the R knee    Baseline 16 seconds    Time 8    Period Weeks    Status On-going      PT LONG TERM GOAL #6   Title Improve to 445ft as indictation of improved function of the R knee    Time 8    Period Weeks    Status  On-going                   Plan - 06/03/21 0856     Clinical Impression Statement Pt reports Rt knee pain is some better. With warm up on the recumbent bike, during the 1st revolution, pt reported sharp Lt knee pain. The distance to the pedals was increased with pt reporting improvement in pain. With CKC exs, pt continued to report L knee pain, 8/10, so L knee involvement in ther ex was stopped. Assessing the Lt knee, patellamobility was WNLs and pain free, McMurray meniscus test was negative, it was TTP at the  insertion of the patella tendon to the patella, and resisted Lt knee ext provoked pain. For the Lt knee, advised pt to use cold paks 3-4xdaily and to contact Dr. Madelon Lips if pain persists. For the R knee, ther ex with OKC exs were completed for strengthening. Cold packs were applied to both knees at the end of the session for symptom management.    Personal Factors and Comorbidities Time since onset of injury/illness/exacerbation;Comorbidity 1    Comorbidities High BMI    Examination-Activity Limitations Transfers;Locomotion Level;Carry;Lift;Stairs;Squat    Examination-Participation Restrictions Occupation;Other    Stability/Clinical Decision Making Stable/Uncomplicated    Clinical Decision Making Low    Rehab Potential Good    PT Frequency 2x / week    PT Duration 8 weeks    PT Treatment/Interventions ADLs/Self Care Home Management;Cryotherapy;Electrical Stimulation;Iontophoresis 4mg /ml Dexamethasone;Moist Heat;Ultrasound;Therapeutic exercise;Therapeutic activities;Stair training;Gait training;Functional mobility training;Patient/family education;Manual techniques;Taping;Vasopneumatic Device;Joint Manipulations;Neuromuscular re-education;Scar mobilization;Passive range of motion    PT Next Visit Plan Progress LE strengthening regimen, SLS progression ,step exercises, TRX,   progress to plyometric/ endurance exercise as able. Assess status of the L knee.    PT Home Exercise Plan [id number]    Consulted and Agree with Plan of Care Patient             Patient will benefit from skilled therapeutic intervention in order to improve the following deficits and impairments:  Abnormal gait, Difficulty walking, Obesity, Decreased activity tolerance, Pain, Decreased strength  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  Other abnormalities of gait and mobility  Difficulty in walking, not elsewhere classified  Chronic pain of right knee  Muscle weakness (generalized)     Problem  List Patient Active Problem List   Diagnosis Date Noted   Postpartum depression 04/23/2015   Eczema 09/04/2014   Abnormal quad screen 03/03/2013   Obesity (BMI 30-39.9) 11/21/2012   Asthma 11/21/2012   Current smoker 11/21/2012   Condyloma acuminatum of perianal region 11/21/2012   Migraine 11/21/2012   01/21/2013 MS, PT 06/04/21 2:04 PM   [facility] [address] [place], [place], [place] Phone: [phone]   Fax:  [phone]  Name: Robyn Ortiz MRN: [name] Date of Birth: [birth year]

## 2021-06-05 ENCOUNTER — Other Ambulatory Visit: Payer: Self-pay

## 2021-06-05 ENCOUNTER — Ambulatory Visit: Payer: Medicaid Other

## 2021-06-05 DIAGNOSIS — G8929 Other chronic pain: Secondary | ICD-10-CM

## 2021-06-05 DIAGNOSIS — R262 Difficulty in walking, not elsewhere classified: Secondary | ICD-10-CM

## 2021-06-05 DIAGNOSIS — M2241 Chondromalacia patellae, right knee: Secondary | ICD-10-CM

## 2021-06-05 DIAGNOSIS — R2689 Other abnormalities of gait and mobility: Secondary | ICD-10-CM

## 2021-06-05 DIAGNOSIS — M6281 Muscle weakness (generalized): Secondary | ICD-10-CM

## 2021-06-05 NOTE — Therapy (Signed)
[facility] Outpatient Rehabilitation [facility] [address] [place], [place], [address] Phone: [phone]   Fax:  [phone]  Physical Therapy Treatment/Progress Note  Patient Details  Name: Robyn Ortiz MRN: [id number] Date of Birth: [birth year] Referring Provider (PT): Frederico Hamman, MD  Progress Note Reporting Period 05/06/21 to 06/05/21  See note below for Objective Data and Assessment of Progress/Goals.       Encounter Date: 06/05/2021   PT End of Session - 06/05/21 1111     Visit Number 10    Number of Visits 20    Date for PT Re-Evaluation 07/01/21    Authorization Type  MEDICAID [place] [employer] [place]    Authorization Time Period 03/14/21 - 07/12/21    Authorization - Visit Number 10    Authorization - Number of Visits 12    Progress Note Due on Visit 10    PT Start Time 0845    PT Stop Time 0943    PT Time Calculation (min) 58 min    Activity Tolerance Patient tolerated treatment well    Behavior During Therapy WFL for tasks assessed/performed             Past Medical History:  Diagnosis Date   Diverticulitis    GERD (gastroesophageal reflux disease)    History of bleeding ulcers    told not to take ibuprofen   History of bronchitis 78yrs ago   Migraine    Obesity    Sleep apnea    CPAP    Past Surgical History:  Procedure Laterality Date   CESAREAN SECTION N/A 04/08/2015   Procedure: CESAREAN SECTION;  Surgeon: Catalina Antigua, MD;  Location: [facility];  Service: Obstetrics;  Laterality: N/A;   CESAREAN SECTION WITH BILATERAL TUBAL LIGATION  04/08/2015   CHOLECYSTECTOMY  03-27-14   Dr Lemar Livings   ESOPHAGOGASTRODUODENOSCOPY N/A 05/19/2017   Procedure: ESOPHAGOGASTRODUODENOSCOPY (EGD);  Surgeon: Toney Reil, MD;  Location: [facility];  Service: Endoscopy;  Laterality: N/A;   TONSILLECTOMY  03/10/2012   Procedure: TONSILLECTOMY;  Surgeon: Suzanna Obey, MD;  Location: [facility] OR;  Service: ENT;  Laterality: Bilateral;    There were no  vitals filed for this visit.   Subjective Assessment - 06/05/21 0904     Subjective Pt reports her R knee is about the same. She notes the L knee pain is better with no pain today, but she reports pain when she knelt on her L knee yesterday (pt points the patella/tendon junction).    Patient Stated Goals To fix the issue with her R knee    Currently in Pain? Yes    Pain Score 5     Pain Location Knee    Pain Orientation Right;Anterior;Medial    Pain Descriptors / Indicators Aching    Pain Type Chronic pain    Pain Onset More than a month ago    Pain Frequency Constant    Pain Score 0    Pain Location Knee    Pain Orientation Left    Pain Descriptors / Indicators Sharp    Pain Type Acute pain    Pain Onset In the past 7 days    Pain Frequency Occasional    Aggravating Factors  Kneeling                             [facility] Adult PT Treatment/Exercise:   Therapeutic Exercise: - Recumbent bike x 5' L3 - slant board stretch x 60 se - Heel  raises 1 x 20 - Prone quad stretch with strap 2 x 30" - Supine hamstring with strap 2 x 30" - OMEGA knee extension 10# concentric and eccentric on RLE x 3x10 - OMEGA knee flexion 20# concentric and eccentric on RLE x 3x10  - Leg press (omega)              - Both 65# 2 x 10             - Right 25# 2 x 10 - banded side steps, 6 x 15', blue theraband - banded side steps, 6 x 15', blue theraband - SLR 3 x 10, 3 lbs   Modalities:  - Cold packs to both knees, 15 mins   Neuro Re-ed:   Not completed today: - Bridge 1 x 10 with 10 sec hold - SLS with rebounder, 15x toss best - SLS with ABC  - 4 inch lateral step up x 20  - 4 inch forward step up x 20                            PT Short Term Goals - 05/20/21 1039       PT SHORT TERM GOAL #1   Title Pt will be Ind in an initial HEP    Period Weeks    Status Achieved    Target Date 03/29/21      PT SHORT TERM GOAL #2   Title Pt will report understanding  of measures to assist in the reduction of R knee pain.    Period Weeks    Status Achieved    Target Date 03/29/21               PT Long Term Goals - 05/20/21 0849       PT LONG TERM GOAL #1   Title Pt's R hip and lnee strength will increase to 5/5 to improve the functional use of the R LE    Baseline 5/5    Status Achieved      PT LONG TERM GOAL #2   Title Pt will report a decrease in R knee pain to 4/10 or less with daily and work related activites    Baseline 6/10    Time 8    Period Weeks    Status On-going      PT LONG TERM GOAL #3   Title pt will be able to walk without and antalgic gait    Baseline Antalgic gait over the R LE    Time 8    Period Weeks    Status On-going      PT LONG TERM GOAL #4   Title Pt will be Ind in a final HEP to maintain or progress achieved LOF    Baseline HEP updated at re-eval    Time 8    Period Weeks    Status On-going      PT LONG TERM GOAL #5   Title Improve 5XSTS to 12 sec or less as demostration of improved function of the R knee    Baseline 16 seconds    Time 8    Period Weeks    Status On-going      PT LONG TERM GOAL #6   Title Improve to 454ft as indictation of improved function of the R knee    Time 8    Period Weeks    Status On-going  Plan - 06/05/21 1116     Clinical Impression Statement Pt reports L knee pain is better, but did have an episode of sharp pain yesterday when she knelt on it. PT was completed for knee flexibility and strengthening, with both OKC and CKC exs. Pt is able to complete all exs and she walks with an equal step length. Pt notes pain is consistently around 5/10. Continued cold packs after session for symptom management. Pt tolerated today's session without adverse effects.    Personal Factors and Comorbidities Time since onset of injury/illness/exacerbation;Comorbidity 1    Comorbidities High BMI    Examination-Activity Limitations Transfers;Locomotion  Level;Carry;Lift;Stairs;Squat    Examination-Participation Restrictions Occupation;Other    Stability/Clinical Decision Making Stable/Uncomplicated    Clinical Decision Making Low    Rehab Potential Good    PT Frequency 2x / week    PT Duration 8 weeks    PT Treatment/Interventions ADLs/Self Care Home Management;Cryotherapy;Electrical Stimulation;Iontophoresis 4mg /ml Dexamethasone;Moist Heat;Ultrasound;Therapeutic exercise;Therapeutic activities;Stair training;Gait training;Functional mobility training;Patient/family education;Manual techniques;Taping;Vasopneumatic Device;Joint Manipulations;Neuromuscular re-education;Scar mobilization;Passive range of motion    PT Next Visit Plan Progress LE strengthening regimen, SLS progression ,step exercises, TRX,   progress to plyometric/ endurance exercise as able. Assess status of the L knee. Assess functional goals next session.    PT Home Exercise Plan [id number]    Consulted and Agree with Plan of Care Patient             Patient will benefit from skilled therapeutic intervention in order to improve the following deficits and impairments:  Abnormal gait, Difficulty walking, Obesity, Decreased activity tolerance, Pain, Decreased strength  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  Other abnormalities of gait and mobility  Muscle weakness (generalized)  Difficulty in walking, not elsewhere classified  Chronic pain of right knee     Problem List Patient Active Problem List   Diagnosis Date Noted   Postpartum depression 04/23/2015   Eczema 09/04/2014   Abnormal quad screen 03/03/2013   Obesity (BMI 30-39.9) 11/21/2012   Asthma 11/21/2012   Current smoker 11/21/2012   Condyloma acuminatum of perianal region 11/21/2012   Migraine 11/21/2012    01/21/2013 MS, PT 06/05/21 11:29 AM   [facility] Outpatient Rehabilitation [facility] [address] [place], [place], [place] Phone: [phone]   Fax:   [phone]  Name: Tessia Kassin MRN: [name] Date of Birth: 1986-07-21

## 2021-06-07 LAB — GI PROFILE, STOOL, PCR

## 2021-06-09 ENCOUNTER — Telehealth: Payer: Self-pay | Admitting: Gastroenterology

## 2021-06-09 NOTE — Telephone Encounter (Signed)
Pt. Calling she said she got her results on her mychart but do not understand them. Requesting a call back

## 2021-06-10 ENCOUNTER — Telehealth: Payer: Self-pay

## 2021-06-10 MED ORDER — VANCOMYCIN HCL 125 MG PO CAPS: 125.0000 mg | ORAL_CAPSULE | Freq: Four times a day (QID) | ORAL | 0 refills | Status: AC

## 2021-06-10 NOTE — Telephone Encounter (Signed)
-----   Message from Toney Reil, MD sent at 06/10/2021  8:26 AM EDT ----- C Diff positive, please send in vancomycin 125mg  capsules every 6 hrs for 10days   RV

## 2021-06-10 NOTE — Telephone Encounter (Signed)
Spoke with patient she understands her results and understands that I have sent her medicine in to pharmacy

## 2021-06-10 NOTE — Telephone Encounter (Signed)
Called patient no answer no way to leave a message sent patient in medicine for her results of cdiff positive

## 2021-06-17 ENCOUNTER — Ambulatory Visit: Payer: Medicaid Other

## 2021-06-19 ENCOUNTER — Other Ambulatory Visit: Payer: Self-pay

## 2021-06-19 ENCOUNTER — Ambulatory Visit: Payer: Medicaid Other | Attending: Orthopedic Surgery

## 2021-06-19 DIAGNOSIS — R262 Difficulty in walking, not elsewhere classified: Secondary | ICD-10-CM | POA: Diagnosis present

## 2021-06-19 DIAGNOSIS — G8929 Other chronic pain: Secondary | ICD-10-CM | POA: Insufficient documentation

## 2021-06-19 DIAGNOSIS — M25561 Pain in right knee: Secondary | ICD-10-CM | POA: Insufficient documentation

## 2021-06-19 DIAGNOSIS — R2689 Other abnormalities of gait and mobility: Secondary | ICD-10-CM | POA: Insufficient documentation

## 2021-06-19 DIAGNOSIS — M2241 Chondromalacia patellae, right knee: Secondary | ICD-10-CM | POA: Insufficient documentation

## 2021-06-19 DIAGNOSIS — M6281 Muscle weakness (generalized): Secondary | ICD-10-CM | POA: Insufficient documentation

## 2021-06-19 NOTE — Therapy (Signed)
[facility] [address] [place], [place], [address] Phone: [phone]   Fax:  [phone]  Physical Therapy Treatment  Patient Details  Name: Robyn Ortiz MRN: [id number] Date of Birth: Jan 09, 1986 Referring Provider (PT): Frederico Hamman, MD   Encounter Date: 06/19/2021   PT End of Session - 06/19/21 1033     Visit Number 11    Number of Visits 20    Date for PT Re-Evaluation 07/01/21    Authorization Type [place] MEDICAID [facility]    Authorization Time Period 03/14/21 - 07/12/21    Authorization - Visit Number 11    Authorization - Number of Visits 12    PT Start Time 1025    PT Stop Time 1105    PT Time Calculation (min) 40 min    Activity Tolerance Patient tolerated treatment well    Behavior During Therapy [facility] for tasks assessed/performed             Past Medical History:  Diagnosis Date   Diverticulitis    GERD (gastroesophageal reflux disease)    History of bleeding ulcers    told not to take ibuprofen   History of bronchitis 28yrs ago   Migraine    Obesity    Sleep apnea    CPAP    Past Surgical History:  Procedure Laterality Date   CESAREAN SECTION N/A 04/08/2015   Procedure: CESAREAN SECTION;  Surgeon: Catalina Antigua, MD;  Location: [facility];  Service: Obstetrics;  Laterality: N/A;   CESAREAN SECTION WITH BILATERAL TUBAL LIGATION  04/08/2015   CHOLECYSTECTOMY  03-27-14   Dr Lemar Livings   ESOPHAGOGASTRODUODENOSCOPY N/A 05/19/2017   Procedure: ESOPHAGOGASTRODUODENOSCOPY (EGD);  Surgeon: Toney Reil, MD;  Location: [facility];  Service: Endoscopy;  Laterality: N/A;   TONSILLECTOMY  03/10/2012   Procedure: TONSILLECTOMY;  Surgeon: Suzanna Obey, MD;  Location: [facility];  Service: ENT;  Laterality: Bilateral;    There were no vitals filed for this visit.     Subjective Assessment - 06/19/21 1043     Subjective Pt reports she has been released to return to work for 4 hours a day starting next week.  Pt notes her L knee pain has resolved. Pt reports her R knee aches at a 7/10 after her PT sessions.    Diagnostic tests Pt reports a MRI is to be scheduled    Patient Stated Goals To fix the issue with her R knee    Currently in Pain? Yes    Pain Score 5     Pain Location Knee    Pain Orientation Right;Anterior    Pain Descriptors / Indicators Aching    Pain Type Chronic pain    Pain Onset More than a month ago    Pain Frequency Constant    Pain Score 0    Pain Location Knee    Pain Orientation Left                 [facility] Adult PT Treatment/Exercise:   Therapeutic Exercise: - Recumbent bike x 5' L3 - slant board stretch 2x30' - Heel raises 1 x 20 - Prone quad stretch with strap 2 x 30" - Supine hamstring with strap 2 x 30" - OMEGA knee extension 15# concentric and eccentric on RLE x 3x10 - OMEGA knee flexion 20# concentric and eccentric on RLE x 3x10  - 6 inch lateral step up x 20  - 6 inch forward step up x 20 - dead lifts, 25#, 2 x  10x - Hinged hip lifts, 25#, 2 x 10x     Modalities:  - Cold packs to R knee, 15 mins   Neuro Re-ed:   Not completed today: - Bridge 1 x 10 with 10 sec hold - SLS with rebounder, 15x toss best - SLS with ABC  - banded side steps, 6 x 15', blue theraband -- SLR 3 x 10, 3 lbs - Leg press (omega)              - Both 65# 2 x 10             - Right 25# 2 x 10                         PT Short Term Goals - 05/20/21 1039       PT SHORT TERM GOAL #1   Title Pt will be Ind in an initial HEP    Period Weeks    Status Achieved    Target Date 03/29/21      PT SHORT TERM GOAL #2   Title Pt will report understanding of measures to assist in the reduction of R knee pain.    Period Weeks    Status Achieved    Target Date 03/29/21               PT Long Term Goals - 05/20/21 0849       PT LONG TERM GOAL #1   Title Pt's R hip and lnee strength will increase to 5/5 to improve the functional use of the R LE     Baseline 5/5    Status Achieved      PT LONG TERM GOAL #2   Title Pt will report a decrease in R knee pain to 4/10 or less with daily and work related activites    Baseline 6/10    Time 8    Period Weeks    Status On-going      PT LONG TERM GOAL #3   Title pt will be able to walk without and antalgic gait    Baseline Antalgic gait over the R LE    Time 8    Period Weeks    Status On-going      PT LONG TERM GOAL #4   Title Pt will be Ind in a final HEP to maintain or progress achieved LOF    Baseline HEP updated at re-eval    Time 8    Period Weeks    Status On-going      PT LONG TERM GOAL #5   Title Improve 5XSTS to 12 sec or less as demostration of improved function of the R knee    Baseline 16 seconds    Time 8    Period Weeks    Status On-going      PT LONG TERM GOAL #6   Title Improve to 448ft as indictation of improved function of the R knee    Time 8    Period Weeks    Status On-going                   Plan - 06/19/21 1055     Clinical Impression Statement PT was continued for R knee/LR strengthening. Forward and lateral steeping, dead lifts and hinged hip lifts were completed to address strength with funtional tasks. Today pt tolerated increased weight with omega knee flexion/extension and increased step height. Pt demonstrated proper technique  and R knee stability with forward and lateral step ups, and with dead and hinged hip lifts c 25 lbs. Pt stated lifting limit at work for one individual is approx 30 lbs. Pt's R knee pain remains moderate at 5/10 prior to PT with a min increase afterward. A cold pack for the R knee was applied for 10 mins for pain and swelling management.    Personal Factors and Comorbidities Time since onset of injury/illness/exacerbation;Comorbidity 1    Comorbidities High BMI    Examination-Activity Limitations Transfers;Locomotion Level;Carry;Lift;Stairs;Squat    Examination-Participation Restrictions Occupation;Other     Stability/Clinical Decision Making Stable/Uncomplicated    Clinical Decision Making Low    Rehab Potential Good    PT Frequency 2x / week    PT Duration 8 weeks    PT Treatment/Interventions ADLs/Self Care Home Management;Cryotherapy;Electrical Stimulation;Iontophoresis 4mg /ml Dexamethasone;Moist Heat;Ultrasound;Therapeutic exercise;Therapeutic activities;Stair training;Gait training;Functional mobility training;Patient/family education;Manual techniques;Taping;Vasopneumatic Device;Joint Manipulations;Neuromuscular re-education;Scar mobilization;Passive range of motion    PT Next Visit Plan Progress LE strengthening regimen, SLS progression ,step exercises, TRX,   progress to plyometric/ endurance exercise as able. Assess status of the L knee. Assess for continuation vs DC from PT services.    PT Home Exercise Plan [id number]    Consulted and Agree with Plan of Care Patient             Patient will benefit from skilled therapeutic intervention in order to improve the following deficits and impairments:  Abnormal gait, Difficulty walking, Obesity, Decreased activity tolerance, Pain, Decreased strength  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  Other abnormalities of gait and mobility  Difficulty in walking, not elsewhere classified  Chronic pain of right knee     Problem List Patient Active Problem List   Diagnosis Date Noted   Postpartum depression 04/23/2015   Eczema 09/04/2014   Abnormal quad screen 03/03/2013   Obesity (BMI 30-39.9) 11/21/2012   Asthma 11/21/2012   Current smoker 11/21/2012   Condyloma acuminatum of perianal region 11/21/2012   Migraine 11/21/2012    01/21/2013 MS, PT 06/19/21 9:59 PM   [facility] [address] [place], [place], [place] Phone: [phone]   Fax:  [phone]  Name: Robyn Ortiz MRN: [name] Date of Birth: [birth year]

## 2021-06-25 ENCOUNTER — Encounter: Payer: Self-pay | Admitting: Physical Therapy

## 2021-06-25 ENCOUNTER — Ambulatory Visit: Payer: Medicaid Other | Admitting: Physical Therapy

## 2021-06-25 ENCOUNTER — Other Ambulatory Visit: Payer: Self-pay

## 2021-06-25 DIAGNOSIS — M2241 Chondromalacia patellae, right knee: Secondary | ICD-10-CM | POA: Diagnosis not present

## 2021-06-25 DIAGNOSIS — R262 Difficulty in walking, not elsewhere classified: Secondary | ICD-10-CM

## 2021-06-25 DIAGNOSIS — M25561 Pain in right knee: Secondary | ICD-10-CM

## 2021-06-25 DIAGNOSIS — G8929 Other chronic pain: Secondary | ICD-10-CM

## 2021-06-25 DIAGNOSIS — R2689 Other abnormalities of gait and mobility: Secondary | ICD-10-CM

## 2021-06-25 DIAGNOSIS — M6281 Muscle weakness (generalized): Secondary | ICD-10-CM

## 2021-06-25 NOTE — Therapy (Addendum)
[place] [facility] [place] [place], [place], [address] Phone: [phone]   Fax:  [phone]  Physical Therapy Treatment/Discharge  Patient Details  Name: Robyn Ortiz MRN: [id number] Date of Birth: 10/08/1985 Referring Provider (PT): Earlie Server, MD   Encounter Date: 06/25/2021   PT End of Session - 06/25/21 0720     Visit Number 12    Number of Visits 20    Date for PT Re-Evaluation 07/01/21    Authorization Type [place] MEDICAID [facility]    Authorization Time Period 03/14/21 - 07/12/21    Authorization - Visit Number 12    Authorization - Number of Visits 12    Progress Note Due on Visit 10    PT Start Time 0715    PT Stop Time 0800    PT Time Calculation (min) 45 min             Past Medical History:  Diagnosis Date   Diverticulitis    GERD (gastroesophageal reflux disease)    History of bleeding ulcers    told not to take ibuprofen   History of bronchitis 25yr ago   Migraine    Obesity    Sleep apnea    CPAP    Past Surgical History:  Procedure Laterality Date   CESAREAN SECTION N/A 04/08/2015   Procedure: CESAREAN SECTION;  Surgeon: PMora Bellman MD;  [facility];  Service: Obstetrics;  Laterality: N/A;   CESAREAN SECTION WITH BILATERAL TUBAL LIGATION  04/08/2015   CHOLECYSTECTOMY  03-27-14   Dr BBary Castilla  ESOPHAGOGASTRODUODENOSCOPY N/A 05/19/2017   Procedure: ESOPHAGOGASTRODUODENOSCOPY (EGD);  Surgeon: VLin Landsman MD;  [facility]  Service: Endoscopy;  Laterality: N/A;   TONSILLECTOMY  03/10/2012   Procedure: TONSILLECTOMY;  Surgeon: JMelissa Montane MD;  [facility]  Service: ENT;  Laterality: Bilateral;    There were no vitals filed for this visit.   Subjective Assessment - 06/25/21 0717     Subjective Right knee is sore. I returned to work yesterday. 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    Currently in Pain? Yes    Pain Score 7     Pain Location Knee     Pain Orientation Right;Medial;Lateral    Pain Descriptors / Indicators Sore    Pain Type Chronic pain    Aggravating Factors  return to work, stairs    Pain Relieving Factors ice, rest, ,meds                [facility] PT Assessment - 06/25/21 0001       AROM   Right Knee Extension 0    Right Knee Flexion 135      Strength   Right Knee Flexion 5/5    Right Knee Extension 5/5                   [facility] Adult PT Treatment/Exercise - 06/25/21 0001       Transfers   Five time sit to stand comments  11.6      Knee/Hip Exercises: Stretches   QSports administrator3 reps;30 seconds    Quad Stretch Limitations prone with strap      Knee/Hip Exercises: Aerobic   Recumbent Bike L3 x 5 minutes      Knee/Hip Exercises: Machines for Strengthening   Cybex Knee Flexion 25# 3x10 with eccentric single leg      Knee/Hip Exercises: Standing   Forward Step Up Step Height: 8";Hand Hold: 0;10 reps;2  sets    Forward Step Up Limitations with opp knee drive    Step Down Step Height: 4";Hand Hold: 0;10 reps;2 sets    Step Down Limitations with heel strike    Rebounder on and off foam oval 10 toss      Knee/Hip Exercises: Seated   Long Probation officer    Long Arc Quad Weight 15 lbs.    Long Arc Quad Limitations Eccentric (2 up, 1 down) 3x10                       PT Short Term Goals - 05/20/21 1039       PT SHORT TERM GOAL #1   Title Pt will be Ind in an initial HEP    Period Weeks    Status Achieved    Target Date 03/29/21      PT SHORT TERM GOAL #2   Title Pt will report understanding of measures to assist in the reduction of R knee pain.    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    Time 8    Period Weeks    Status Partially Met      PT LONG TERM GOAL #4   Title Pt will be Ind in a final HEP to maintain or progress achieved LOF    Baseline HEP updated at re-eval    Time 8    Period Weeks    Status On-going      PT LONG TERM GOAL #5   Title Improve 5XSTS to 12 sec or less as demostration of improved function of the R knee    Baseline 16 seconds; 06/25/21:11.6    Time 8    Period Weeks    Status Achieved      PT LONG TERM GOAL #6   Title Improve 2MWT to 465f as indictation of improved function of the R knee    Baseline See flowsheet; 06/25/21: 407 feet    Time 8    Period Weeks    Status On-going                   Plan - 06/25/21 0859     Clinical Impression Statement She is 9 weeks s/p R knee arthroscopy, partial medial meniscectomy, chondroplasty medial compartment patellofemoral area on 04/23/2021 with Dr. 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 She she reports  no increased pain in left knee. She reports difficulty and pain with frequent stair climbing to deliver package. Her 5 x STS has improved to <12 sec, LTG #5 met. Her MMT and AROM of right knee are WFL. She demonstrates decreased eccentric quad control with stair negotiation and difficulty completing her job duties. . She will benefit from continued skilled PT to address her primary impairments and return to her prior level of function without limitation.    Personal Factors and Comorbidities Time since  onset of injury/illness/exacerbation;Comorbidity 1    Examination-Activity Limitations Transfers;Locomotion Level;Carry;Lift;Stairs;Squat    Examination-Participation Restrictions Occupation;Other    PT Frequency 1x / week    PT Duration 6 weeks    PT Treatment/Interventions ADLs/Self Care Home Management;Cryotherapy;Electrical Stimulation;Iontophoresis 7m/ml Dexamethasone;Moist Heat;Ultrasound;Therapeutic exercise;Therapeutic activities;Stair training;Gait training;Functional mobility training;Patient/family education;Manual techniques;Taping;Vasopneumatic Device;Joint Manipulations;Neuromuscular re-education;Scar mobilization;Passive range of motion    PT Next Visit Plan Progress LE strengthening regimen, SLS progression ,step exercises, TRX,   progress to plyometric/ endurance exercise as able.    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  Other abnormalities of gait and mobility  Difficulty in walking, not elsewhere classified  Chronic pain of right knee  Muscle weakness (generalized)     Problem List Patient Active Problem List   Diagnosis Date Noted   Postpartum depression 04/23/2015   Eczema 09/04/2014   Abnormal quad screen 03/03/2013   Obesity (BMI 30-39.9) 11/21/2012   Asthma 11/21/2012   Current smoker 11/21/2012   Condyloma acuminatum of perianal region 11/21/2012   Migraine 11/21/2012    DDorene Ar PTA 06/25/2021, 9:20 AM  PHYSICAL THERAPY DISCHARGE SUMMARY  Visits from Start of Care: 12  Current functional level related to goals / functional outcomes: See above   Remaining deficits: See above   Education / Equipment: HEP   Patient agrees to discharge. Patient  goals were partially met. Patient is being discharged due to not returning since the last visit.  AGar PontoMS, PT 09/19/21 8:17 AM   [facility] [place] [address]Phone: [phone]  Fax:  [phone] Name: Robyn VogelgesangMRN: [id number] of Birth: 706/27/1987

## 2021-07-01 ENCOUNTER — Ambulatory Visit: Payer: Medicaid Other | Admitting: Physical Therapy

## 2021-07-04 ENCOUNTER — Ambulatory Visit: Payer: Medicaid Other

## 2021-07-04 ENCOUNTER — Telehealth: Payer: Self-pay

## 2021-07-04 NOTE — Telephone Encounter (Signed)
Spoke with pt by phone. Pt reports having a sick child and forgot about the appt. Advised pt re: no show. Pt reminder of upcomings appts next week.

## 2021-07-08 ENCOUNTER — Ambulatory Visit: Payer: Medicaid Other

## 2021-07-09 ENCOUNTER — Ambulatory Visit: Payer: Medicaid Other

## 2021-08-08 ENCOUNTER — Ambulatory Visit: Payer: Medicaid Other | Admitting: Gastroenterology

## 2021-10-06 ENCOUNTER — Other Ambulatory Visit: Payer: Self-pay | Admitting: Gastroenterology

## 2021-10-06 DIAGNOSIS — K9089 Other intestinal malabsorption: Secondary | ICD-10-CM

## 2021-11-18 ENCOUNTER — Ambulatory Visit: Payer: Medicaid Other | Admitting: Gastroenterology

## 2021-11-26 ENCOUNTER — Encounter: Payer: Self-pay | Admitting: Gastroenterology

## 2021-11-26 ENCOUNTER — Other Ambulatory Visit: Payer: Self-pay

## 2021-11-26 ENCOUNTER — Ambulatory Visit (INDEPENDENT_AMBULATORY_CARE_PROVIDER_SITE_OTHER): Payer: Medicaid Other | Admitting: Gastroenterology

## 2021-11-26 VITALS — BP 101/70 | HR 78 | Temp 97.9°F | Ht 65.0 in | Wt 229.4 lb

## 2021-11-26 DIAGNOSIS — Z8619 Personal history of other infectious and parasitic diseases: Secondary | ICD-10-CM

## 2021-11-26 DIAGNOSIS — K529 Noninfective gastroenteritis and colitis, unspecified: Secondary | ICD-10-CM

## 2021-11-26 DIAGNOSIS — R5383 Other fatigue: Secondary | ICD-10-CM

## 2021-11-26 DIAGNOSIS — K625 Hemorrhage of anus and rectum: Secondary | ICD-10-CM

## 2021-11-26 DIAGNOSIS — R1013 Epigastric pain: Secondary | ICD-10-CM

## 2021-11-26 DIAGNOSIS — G8929 Other chronic pain: Secondary | ICD-10-CM

## 2021-11-26 MED ORDER — NA SULFATE-K SULFATE-MG SULF 17.5-3.13-1.6 GM/177ML PO SOLN: 354.0000 mL | Freq: Once | ORAL | 0 refills | Status: AC

## 2021-11-26 NOTE — Progress Notes (Signed)
?  ?Cephas Darby, MD ?[address]  ?[address]  ?[place], [place] [address]  ?Main: [phone]  ?Fax: [phone] ? ? ? ?Gastroenterology Consultation ? ?Referring Provider:     Remi Haggard, FNP ?Primary Care Physician:  Remi Haggard, FNP ?Primary Gastroenterologist:  Dr. Cephas Darby ?Reason for Consultation:   Chronic diarrhea, history of C. difficile infection ?      ? HPI:   ?Robyn Ortiz is a 36 y.o. female referred by Dr. Remi Haggard, FNP  for consultation & management of Epigastric pain and right upper quadrant pain, sharp knot-like sensation worse after eating associated with nausea. She has been having these symptoms for at least 40 years but got worse after her last child was born 2 years ago. She has been taking ibuprofen 600 mg twice daily both for abdominal pain and headache. She has been gaining weight, denies loss of appetite, melena, rectal bleeding, trouble swallowing. She is also suffering from nonbloody diarrhea, after each meal since she had a cholecystectomy. She denies hematochezia, blood mixed with stool, lower abdominal pain, urgency, constipation. She had gallbladder removal for her upper abdominal symptoms, however her symptoms continued. She smokes daily but does not drink alcohol. She takes Protonix 40 mg daily.  ? ?Follow-up visit 10/06/2017: ?Patient underwent EGD which was unremarkable. There was no evidence of H. pylori infection. She tried cholestyramine which resulted in constipation. She is taking Protonix as needed for heartburn. She continues to have epigastric pain, abdominal cramps and frequent bowel movements with bloating. She drinks sodas, one can a day. She consumes red meat daily. She has gained weight since switching her job. She continues to smoke cigarettes. She is with her husband today. Her CBC and ferritin were normal. Celiac serologies were negative. She does have chronic history of headaches and takes BC powder as needed. ? ?Follow-up  visit 12/08/2017 ?She lost about 15 pounds intentionally since last visit the following healthy diet. She continues to have increased bowel frequency associated with abdominal cramps. She is taking cholestyramine daily and does not take on Saturday which results in severe diarrhea. She does not have a bowel movement while on cholestyramine. She is on amitriptyline 25 mg at bedtime. She tried FD guard which did not help. She continues to smoke.  ? ?She takes care of 6 kids, 3 are adopted ? ?Follow-up visit 05/19/2021 ?Patient has not seen me in 3 years.  She is here because of recurrence of diarrhea.  She reports that her diarrhea has recurred for more than a year after she ran out of cholestyramine that she was taking before.  She stopped amitriptyline.  She underwent work-up of chronic diarrhea including stool studies to rule out infection which was negative, celiac panel was negative, C. difficile was negative, no evidence of iron deficiency.  Patient reports that she has about 4-5 episodes of watery bowel movements daily.  She reports that even water runs through her.  She stopped drinking sodas.  She likes to eat cheese.  She denies any weight loss.  No evidence of anemia.  She has occasional diarrhea at night as well.  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  She does smoke half pack per day ? ?Follow-up visit 11/26/2021 ?Patient is here for follow-up of ongoing diarrhea and rectal bleeding.  She also reports epigastric pain.  She was diagnosed with C. difficile infection in October 2022, treated with 10 days course  of oral vancomycin 125 mg 4 times daily.  Patient reports that her diarrhea improved when she was on antibiotics.  It then recurred, has been taking cholestyramine twice daily which keeps her bowels soft and mushy.  She has been noticing blood mixed with the stool as well and her stools are foul-smelling.  Her weight has been stable in fact gained some  weight since she has been staying home.  She does report epigastric burning pain.  She has not tried any acid reflux medication. ? ?She denies family history of GI malignancy ?GI Procedures: EGD 06/05/2017 normal ? ? ?Past Medical History:  ?Diagnosis Date  ? Diverticulitis   ? GERD (gastroesophageal reflux disease)   ? History of bleeding ulcers   ? told not to take ibuprofen  ? History of bronchitis 46yrs ago  ? Migraine   ? Obesity   ? Sleep apnea   ? CPAP  ? ? ?Past Surgical History:  ?Procedure Laterality Date  ? CESAREAN SECTION N/A 04/08/2015  ? Procedure: CESAREAN SECTION;  Surgeon: Mora Bellman, MD;  Location: [facility];  Service: Obstetrics;  Laterality: N/A;  ? CESAREAN SECTION WITH BILATERAL TUBAL LIGATION  04/08/2015  ? CHOLECYSTECTOMY  03-27-14  ? Dr Bary Castilla  ? ESOPHAGOGASTRODUODENOSCOPY N/A 05/19/2017  ? Procedure: ESOPHAGOGASTRODUODENOSCOPY (EGD);  Surgeon: Lin Landsman, MD;  Location: [place];  Service: Endoscopy;  Laterality: N/A;  ? TONSILLECTOMY  03/10/2012  ? Procedure: TONSILLECTOMY;  Surgeon: Melissa Montane, MD;  Location: [facility];  Service: ENT;  Laterality: Bilateral;  ? ? ? ?Current Outpatient Medications:  ?  acetaminophen (TYLENOL) 325 MG tablet, Take 650 mg by mouth every 6 (six) hours as needed for moderate pain., Disp: , Rfl:  ?  celecoxib (CELEBREX) 200 MG capsule, Take by mouth., Disp: , Rfl:  ?  cetirizine (ZYRTEC) 10 MG tablet, Take 10 mg by mouth daily., Disp: , Rfl: 5 ?  cholestyramine (QUESTRAN) 4 g packet, TAKE 1 PACKET BY MOUTH 2 TIMES DAILY., Disp: 180 packet, Rfl: 1 ?  DENTA 5000 PLUS 1.1 % CREA dental cream, Take by mouth at bedtime., Disp: , Rfl:  ?  montelukast (SINGULAIR) 10 MG tablet, Take 10 mg by mouth daily., Disp: , Rfl:  ?  Na Sulfate-K Sulfate-Mg Sulf 17.5-3.13-1.6 GM/177ML SOLN, Take 354 mLs by mouth once for 1 dose., Disp: 354 mL, Rfl: 0 ?  NATURAL VITAMIN D-3 125 MCG (5000 UT) TABS, Take 1 tablet by mouth daily., Disp: , Rfl:  ?  ondansetron (ZOFRAN) 4  MG tablet, Take 4 mg by mouth every 8 (eight) hours as needed., Disp: , Rfl:  ?  PROAIR HFA 108 (90 Base) MCG/ACT inhaler, Inhale into the lungs., Disp: , Rfl:  ?  SPIRIVA RESPIMAT 1.25 MCG/ACT AERS, SMARTSIG:2 Puff(s) By Mouth Daily, Disp: , Rfl:  ?  SYMBICORT 160-4.5 MCG/ACT inhaler, SMARTSIG:2 Puff(s) By Mouth Twice Daily, Disp: , Rfl:  ?  valACYclovir (VALTREX) 1000 MG tablet, Take 4,000 mg by mouth once., Disp: , Rfl:  ?  ASPIRIN LOW DOSE 81 MG EC tablet, PLEASE SEE ATTACHED FOR DETAILED DIRECTIONS (Patient not taking: Reported on 11/26/2021), Disp: , Rfl:  ?  busPIRone (BUSPAR) 7.5 MG tablet, Take 7.5 mg by mouth 2 (two) times daily. (Patient not taking: Reported on 11/26/2021), Disp: , Rfl:  ?  cyclobenzaprine (FLEXERIL) 10 MG tablet, Take 1 tablet (10 mg total) by mouth 2 (two) times daily as needed for muscle spasms. (Patient not taking: Reported on 11/26/2021), Disp: 20 tablet, Rfl:  0 ?  cyclobenzaprine (FLEXERIL) 10 MG tablet, Take 1 tablet by mouth twice daily as needed for muscle spasms (Patient not taking: Reported on 05/19/2021), Disp: 20 tablet, Rfl: 0 ?  dicyclomine (BENTYL) 10 MG capsule, Take 1 capsule (10 mg total) by mouth 4 (four) times daily -  before meals and at bedtime., Disp: 30 capsule, Rfl: 0 ?  fluticasone (FLONASE) 50 MCG/ACT nasal spray, Place 1 spray into both nostrils daily. (Patient not taking: Reported on 11/26/2021), Disp: , Rfl:  ?  meloxicam (MOBIC) 15 MG tablet, Take 15 mg by mouth daily. (Patient not taking: Reported on 11/26/2021), Disp: , Rfl:  ?  nicotine (NICODERM CQ - DOSED IN MG/24 HR) 7 mg/24hr patch, Place 7 mg onto the skin daily. (Patient not taking: Reported on 11/26/2021), Disp: , Rfl:  ?  NURTEC 75 MG TBDP, Take 1 tablet by mouth every other day as needed. (Patient not taking: Reported on 11/26/2021), Disp: , Rfl:  ? ? ?Family History  ?Problem Relation Age of Onset  ? Diabetes Father   ? Bipolar disorder Mother   ? Heart disease Paternal Grandmother   ? Cancer Paternal  Grandfather   ?     intestional cancer  ? Other Paternal Grandfather   ?     deteriorating disc  ?  ? ?Social History  ? ?Tobacco Use  ? Smoking status: Every Day  ?  Packs/day: 1.00  ?  Years: 13.00  ?

## 2021-11-27 LAB — CBC
Hematocrit: 41.7 % (ref 34.0–46.6)
Hemoglobin: 14 g/dL (ref 11.1–15.9)
MCH: 29.9 pg (ref 26.6–33.0)
MCHC: 33.6 g/dL (ref 31.5–35.7)
MCV: 89 fL (ref 79–97)
Platelets: 289 10*3/uL (ref 150–450)
RBC: 4.69 x10E6/uL (ref 3.77–5.28)
RDW: 12.4 % (ref 11.7–15.4)
WBC: 5.8 10*3/uL (ref 3.4–10.8)

## 2021-11-27 LAB — VITAMIN B12: Vitamin B-12: 387 pg/mL (ref 232–1245)

## 2021-11-27 LAB — FERRITIN: Ferritin: 36 ng/mL (ref 15–150)

## 2021-12-18 ENCOUNTER — Other Ambulatory Visit: Payer: Self-pay

## 2021-12-18 ENCOUNTER — Ambulatory Visit
Admission: RE | Admit: 2021-12-18 | Discharge: 2021-12-18 | Disposition: A | Discharge status: Home / Self Care | Payer: Medicaid Other | Attending: Gastroenterology | Admitting: Gastroenterology

## 2021-12-18 ENCOUNTER — Encounter
Admission: RE | Disposition: A | Discharge status: Home / Self Care | Payer: Self-pay | Source: Home / Self Care | Attending: Gastroenterology

## 2021-12-18 ENCOUNTER — Ambulatory Visit: Payer: Medicaid Other | Admitting: Anesthesiology

## 2021-12-18 ENCOUNTER — Encounter: Payer: Self-pay | Admitting: Gastroenterology

## 2021-12-18 DIAGNOSIS — K6389 Other specified diseases of intestine: Secondary | ICD-10-CM | POA: Diagnosis not present

## 2021-12-18 DIAGNOSIS — E669 Obesity, unspecified: Secondary | ICD-10-CM | POA: Insufficient documentation

## 2021-12-18 DIAGNOSIS — K3189 Other diseases of stomach and duodenum: Secondary | ICD-10-CM | POA: Diagnosis not present

## 2021-12-18 DIAGNOSIS — K5730 Diverticulosis of large intestine without perforation or abscess without bleeding: Secondary | ICD-10-CM | POA: Insufficient documentation

## 2021-12-18 DIAGNOSIS — K259 Gastric ulcer, unspecified as acute or chronic, without hemorrhage or perforation: Secondary | ICD-10-CM | POA: Diagnosis not present

## 2021-12-18 DIAGNOSIS — F17210 Nicotine dependence, cigarettes, uncomplicated: Secondary | ICD-10-CM | POA: Diagnosis not present

## 2021-12-18 DIAGNOSIS — Z8619 Personal history of other infectious and parasitic diseases: Secondary | ICD-10-CM

## 2021-12-18 DIAGNOSIS — K529 Noninfective gastroenteritis and colitis, unspecified: Secondary | ICD-10-CM | POA: Insufficient documentation

## 2021-12-18 DIAGNOSIS — J45909 Unspecified asthma, uncomplicated: Secondary | ICD-10-CM | POA: Diagnosis not present

## 2021-12-18 DIAGNOSIS — Z6838 Body mass index (BMI) 38.0-38.9, adult: Secondary | ICD-10-CM | POA: Diagnosis not present

## 2021-12-18 DIAGNOSIS — G4730 Sleep apnea, unspecified: Secondary | ICD-10-CM | POA: Diagnosis not present

## 2021-12-18 DIAGNOSIS — R1013 Epigastric pain: Secondary | ICD-10-CM

## 2021-12-18 DIAGNOSIS — K625 Hemorrhage of anus and rectum: Secondary | ICD-10-CM

## 2021-12-18 DIAGNOSIS — Z8711 Personal history of peptic ulcer disease: Secondary | ICD-10-CM | POA: Insufficient documentation

## 2021-12-18 DIAGNOSIS — K2100 Gastro-esophageal reflux disease with esophagitis, without bleeding: Secondary | ICD-10-CM | POA: Insufficient documentation

## 2021-12-18 HISTORY — PX: COLONOSCOPY WITH PROPOFOL: SHX5780

## 2021-12-18 HISTORY — PX: ESOPHAGOGASTRODUODENOSCOPY (EGD) WITH PROPOFOL: SHX5813

## 2021-12-18 LAB — POCT PREGNANCY, URINE: Preg Test, Ur: NEGATIVE

## 2021-12-18 SURGERY — COLONOSCOPY WITH PROPOFOL
Anesthesia: General

## 2021-12-18 MED ORDER — PROPOFOL 500 MG/50ML IV EMUL
INTRAVENOUS | Status: DC | PRN
  Administered 2021-12-18: 145 ug/kg/min via INTRAVENOUS

## 2021-12-18 MED ORDER — GLYCOPYRROLATE 0.2 MG/ML IJ SOLN
INTRAMUSCULAR | Status: DC | PRN
  Administered 2021-12-18: .2 mg via INTRAVENOUS

## 2021-12-18 MED ORDER — MIDAZOLAM HCL 2 MG/2ML IJ SOLN
INTRAMUSCULAR | Status: DC | PRN
  Administered 2021-12-18: 2 mg via INTRAVENOUS

## 2021-12-18 MED ORDER — PROPOFOL 10 MG/ML IV BOLUS
INTRAVENOUS | Status: DC | PRN
  Administered 2021-12-18: 20 mg via INTRAVENOUS
  Administered 2021-12-18: 60 mg via INTRAVENOUS
  Administered 2021-12-18: 20 mg via INTRAVENOUS

## 2021-12-18 MED ORDER — OMEPRAZOLE 40 MG PO CPDR: 40.0000 mg | DELAYED_RELEASE_CAPSULE | Freq: Every day | ORAL | 0 refills | Status: DC

## 2021-12-18 MED ORDER — LIDOCAINE HCL (CARDIAC) PF 100 MG/5ML IV SOSY
PREFILLED_SYRINGE | INTRAVENOUS | Status: DC | PRN
  Administered 2021-12-18: 100 mg via INTRAVENOUS

## 2021-12-18 MED ORDER — SODIUM CHLORIDE 0.9 % IV SOLN: INTRAVENOUS | Status: DC

## 2021-12-18 MED ORDER — MIDAZOLAM HCL 2 MG/2ML IJ SOLN
INTRAMUSCULAR | Status: AC
  Filled 2021-12-18: qty 2

## 2021-12-18 NOTE — Anesthesia Procedure Notes (Addendum)
Procedure Name: General with mask airway ?Date/Time: 12/18/2021 9:30 AM ?Performed by: Mohammed Kindle, CRNA ?Pre-anesthesia Checklist: Patient identified, Emergency Drugs available, Suction available and Patient being monitored ?Patient Re-evaluated:Patient Re-evaluated prior to induction ?Oxygen Delivery Method: Simple face mask ?Induction Type: IV induction ?Ventilation: Oral airway inserted - appropriate to patient size ?Placement Confirmation: positive ETCO2, CO2 detector and breath sounds checked- equal and bilateral ?Dental Injury: Teeth and Oropharynx as per pre-operative assessment  ? ? ? ? ?

## 2021-12-18 NOTE — Anesthesia Postprocedure Evaluation (Signed)
Anesthesia Post Note ? ?Patient: Robyn Ortiz ? ?Procedure(s) Performed: COLONOSCOPY WITH PROPOFOL ?ESOPHAGOGASTRODUODENOSCOPY (EGD) WITH PROPOFOL ? ?Patient location during evaluation: PACU ?Anesthesia Type: General ?Level of consciousness: awake and alert, oriented and patient cooperative ?Pain management: pain level controlled ?Vital Signs Assessment: post-procedure vital signs reviewed and stable ?Respiratory status: spontaneous breathing, nonlabored ventilation and respiratory function stable ?Cardiovascular status: blood pressure returned to baseline and stable ?Postop Assessment: adequate PO intake ?Anesthetic complications: no ? ? ?No notable events documented. ? ? ?Last Vitals:  ?Vitals:  ? 12/18/21 0953 12/18/21 1013  ?BP: (!) 88/54 104/80  ?Pulse: 82   ?Resp: (!) 22   ?Temp: (!) 36.3 ?C   ?SpO2: 98%   ?  ?Last Pain:  ?Vitals:  ? 12/18/21 1013  ?TempSrc:   ?PainSc: 0-No pain  ? ? ?  ?  ?  ?  ?  ?  ? ?Darrin Nipper ? ? ? ? ?

## 2021-12-18 NOTE — Op Note (Signed)
[facility] ?Gastroenterology ?Patient Name: Robyn Ortiz ?Procedure Date: 5/4/[facility] 9:02 AM ?MRN: [id number] ?Account #: [id number] ?Date of Birth: September 04, 1985 ?Admit Type: Outpatient ?Age: 36 ?[facility] ?Gender: Female ?Note Status: Finalized ?Instrument Name: Upper Endoscope [id number] ?Procedure:             Upper GI endoscopy ?Indications:           Epigastric abdominal pain ?Providers:             Toney Reilohini Reddy Renad Jenniges MD, MD ?Referring MD:          Fernand Parkinsheryl P. Clint GuyLindley (Referring MD) ?Medicines:             General Anesthesia ?Complications:         No immediate complications. Estimated blood loss: None. ?Procedure:             Pre-Anesthesia Assessment: ?                       - Prior to the procedure, a History and Physical was  ?                       performed, and patient medications and allergies were  ?                       reviewed. The patient is competent. The risks and  ?                       benefits of the procedure and the sedation options and  ?                       risks were discussed with the patient. All questions  ?                       were answered and informed consent was obtained.  ?                       Patient identification and proposed procedure were  ?                       verified by the physician, the nurse, the  ?                       anesthesiologist, the anesthetist and the technician  ?                       in the pre-procedure area in the procedure room in the  ?                       endoscopy suite. Mental Status Examination: alert and  ?                       oriented. Airway Examination: normal oropharyngeal  ?                       airway and neck mobility. Respiratory Examination:  ?                       clear to auscultation. CV Examination: normal.  ?  Prophylactic Antibiotics: The patient does not require  ?                       prophylactic antibiotics. Prior Anticoagulants: The  ?                       patient  has taken no previous anticoagulant or  ?                       antiplatelet agents. ASA Grade Assessment: II - A  ?                       patient with mild systemic disease. After reviewing  ?                       the risks and benefits, the patient was deemed in  ?                       satisfactory condition to undergo the procedure. The  ?                       anesthesia plan was to use general anesthesia.  ?                       Immediately prior to administration of medications,  ?                       the patient was re-assessed for adequacy to receive  ?                       sedatives. The heart rate, respiratory rate, oxygen  ?                       saturations, blood pressure, adequacy of pulmonary  ?                       ventilation, and response to care were monitored  ?                       throughout the procedure. The physical status of the  ?                       patient was re-assessed after the procedure. ?                       After obtaining informed consent, the endoscope was  ?                       passed under direct vision. Throughout the procedure,  ?                       the patient's blood pressure, pulse, and oxygen  ?                       saturations were monitored continuously. The Endoscope  ?                       was introduced through the mouth, and advanced to the  ?  second part of duodenum. The upper GI endoscopy was  ?                       accomplished without difficulty. The patient tolerated  ?                       the procedure well. ?Findings: ?     The duodenal bulb and second portion of the duodenum were normal.  ?     Biopsies were taken with a cold forceps for histology. ?     Multiple dispersed medium to large linear erosions with no bleeding and  ?     no stigmata of recent bleeding were found at the incisura and in the  ?     gastric antrum. Biopsies were taken with a cold forceps for Helicobacter  ?     pylori testing. ?     The  gastric body was normal. Biopsies were taken with a cold forceps for  ?     Helicobacter pylori testing. ?     The cardia and gastric fundus were normal on retroflexion. ?     Esophagogastric landmarks were identified: the gastroesophageal junction  ?     was found at 35 cm from the incisors. ?     LA Grade A (one or more mucosal breaks less than 5 mm, not extending  ?     between tops of 2 mucosal folds) esophagitis with no bleeding was found  ?     at the gastroesophageal junction. ?Impression:            - Normal duodenal bulb and second portion of the  ?                       duodenum. Biopsied. ?                       - Erosive gastropathy with no bleeding and no stigmata  ?                       of recent bleeding. Biopsied. ?                       - Normal gastric body. Biopsied. ?                       - Esophagogastric landmarks identified. ?                       - LA Grade A reflux esophagitis with no bleeding. ?Recommendation:        - Await pathology results. ?                       - Follow an antireflux regimen. ?                       - Use Prilosec (omeprazole) 40 mg PO daily for 3  ?                       months. ?                       - Proceed with colonoscopy as scheduled ?  See colonoscopy report ?Procedure Code(s):     --- Professional --- ?                       [phone], Esophagogastroduodenoscopy, flexible,  ?                       transoral; with biopsy, single or multiple ?Diagnosis Code(s):     --- Professional --- ?                       K31.89, Other diseases of stomach and duodenum ?                       K21.00, Gastro-esophageal reflux disease with  ?                       esophagitis, without bleeding ?                       R10.13, Epigastric pain ?CPT copyright 2019 American Medical Association. All rights reserved. ?The codes documented in this report are preliminary and upon coder review may  ?be revised to meet current compliance requirements. ?Dr. Libby Maw ?Robyn Arshad Alger Memos MD, MD ?12/18/2021 9:35:47 AM ?This report has been signed electronically. ?Number of Addenda: 0 ?Note Initiated On: 12/18/2021 9:02 AM ?Estimated Blood Loss:  Estimated blood loss: none. ?     [facility] ?

## 2021-12-18 NOTE — Anesthesia Preprocedure Evaluation (Addendum)
Anesthesia Evaluation  ?Patient identified by MRN, date of birth, ID band ?Patient awake ? ? ? ?Reviewed: ?Allergy & Precautions, NPO status , Patient's Chart, lab work & pertinent test results ? ?History of Anesthesia Complications ?Negative for: history of anesthetic complications ? ?Airway ?Mallampati: II ? ? ?Neck ROM: Full ? ? ? Dental ? ?(+) Chipped, Missing ?  ?Pulmonary ?asthma , sleep apnea and Continuous Positive Airway Pressure Ventilation , Current Smoker (1/2 ppd)Patient did not abstain from smoking.,  ?  ?Pulmonary exam normal ?breath sounds clear to auscultation ? ? ? ? ? ? Cardiovascular ?Exercise Tolerance: Good ?negative cardio ROS ?Normal cardiovascular exam ?Rhythm:Regular Rate:Normal ? ? ?  ?Neuro/Psych ? Headaches,   ? GI/Hepatic ?PUD, GERD  ,  ?Endo/Other  ?Obesity  ? Renal/GU ?negative Renal ROS  ? ?  ?Musculoskeletal ? ? Abdominal ?  ?Peds ? Hematology ?negative hematology ROS ?(+)   ?Anesthesia Other Findings ? ? Reproductive/Obstetrics ? ?  ? ? ? ? ? ? ? ? ? ? ? ? ? ?  ?  ? ? ? ? ? ? ? ?Anesthesia Physical ?Anesthesia Plan ? ?ASA: 2 ? ?Anesthesia Plan: General  ? ?Post-op Pain Management:   ? ?Induction: Intravenous ? ?PONV Risk Score and Plan: 2 and Propofol infusion, TIVA and Treatment may vary due to age or medical condition ? ?Airway Management Planned: Natural Airway ? ?Additional Equipment:  ? ?Intra-op Plan:  ? ?Post-operative Plan:  ? ?Informed Consent: I have reviewed the patients History and Physical, chart, labs and discussed the procedure including the risks, benefits and alternatives for the proposed anesthesia with the patient or authorized representative who has indicated his/her understanding and acceptance.  ? ? ? ? ? ?Plan Discussed with: CRNA ? ?Anesthesia Plan Comments: (LMA/GETA backup discussed.  Patient consented for risks of anesthesia including but not limited to:  ?- adverse reactions to medications ?- damage to eyes, teeth, lips  or other oral mucosa ?- nerve damage due to positioning  ?- sore throat or hoarseness ?- damage to heart, brain, nerves, lungs, other parts of body or loss of life ? ?Informed patient about role of CRNA in peri- and intra-operative care.  Patient voiced understanding.)  ? ? ? ? ? ? ?Anesthesia Quick Evaluation ? ?

## 2021-12-18 NOTE — Op Note (Signed)
[facility] ?Gastroenterology ?Patient Name: Robyn Ortiz ?Procedure Date: 12/18/2021 9:01 AM ?MRN: [id number] ?Account #: [id number] ?Date of Birth: February 04, 1986 ?Admit Type: Outpatient ?Age: 36 ?[facility] ?Gender: Female ?Note Status: Finalized ?Instrument Name: Colonscope [id number] ?Procedure:             Colonoscopy ?Indications:           This is the patient's first colonoscopy, Chronic  ?                       diarrhea ?Providers:             Toney Reilohini Reddy Demetrice Combes MD, MD ?Referring MD:          Fernand Parkinsheryl P. Clint GuyLindley (Referring MD) ?Medicines:             General Anesthesia ?Complications:         No immediate complications. Estimated blood loss: None. ?Procedure:             Pre-Anesthesia Assessment: ?                       - Prior to the procedure, a History and Physical was  ?                       performed, and patient medications and allergies were  ?                       reviewed. The patient is competent. The risks and  ?                       benefits of the procedure and the sedation options and  ?                       risks were discussed with the patient. All questions  ?                       were answered and informed consent was obtained.  ?                       Patient identification and proposed procedure were  ?                       verified by the physician, the nurse, the  ?                       anesthesiologist, the anesthetist and the technician  ?                       in the pre-procedure area in the procedure room in the  ?                       endoscopy suite. Mental Status Examination: alert and  ?                       oriented. Airway Examination: normal oropharyngeal  ?                       airway and neck mobility. Respiratory Examination:  ?  clear to auscultation. CV Examination: normal.  ?                       Prophylactic Antibiotics: The patient does not require  ?                       prophylactic antibiotics. Prior  Anticoagulants: The  ?                       patient has taken no previous anticoagulant or  ?                       antiplatelet agents. ASA Grade Assessment: II - A  ?                       patient with mild systemic disease. After reviewing  ?                       the risks and benefits, the patient was deemed in  ?                       satisfactory condition to undergo the procedure. The  ?                       anesthesia plan was to use general anesthesia.  ?                       Immediately prior to administration of medications,  ?                       the patient was re-assessed for adequacy to receive  ?                       sedatives. The heart rate, respiratory rate, oxygen  ?                       saturations, blood pressure, adequacy of pulmonary  ?                       ventilation, and response to care were monitored  ?                       throughout the procedure. The physical status of the  ?                       patient was re-assessed after the procedure. ?                       After obtaining informed consent, the colonoscope was  ?                       passed under direct vision. Throughout the procedure,  ?                       the patient's blood pressure, pulse, and oxygen  ?                       saturations were monitored continuously. The  ?  Colonoscope was introduced through the anus and  ?                       advanced to the 15 cm into the ileum. The colonoscopy  ?                       was performed without difficulty. The patient  ?                       tolerated the procedure well. The quality of the bowel  ?                       preparation was evaluated using the BBPS [facility] Bowel  ?                       Preparation Scale) with scores of: Right Colon = 3,  ?                       Transverse Colon = 3 and Left Colon = 3 (entire mucosa  ?                       seen well with no residual staining, small fragments  ?                       of stool  or opaque liquid). The total BBPS score  ?                       equals 9. ?Findings: ?     The perianal and digital rectal examinations were normal. Pertinent  ?     negatives include normal sphincter tone and no palpable rectal lesions. ?     The terminal ileum appeared normal. ?     A patchy area of mildly erythematous mucosa was found in the sigmoid  ?     colon. Biopsies were taken with a cold forceps for histology. ?     Normal mucosa was found in the descending colon and in the right colon.  ?     Biopsies were taken with a cold forceps for histology. ?     The retroflexed view of the distal rectum and anal verge was normal and  ?     showed no anal or rectal abnormalities. ?     Many diverticula were found in the sigmoid colon. ?Impression:            - The examined portion of the ileum was normal. ?                       - Erythematous mucosa in the sigmoid colon. Biopsied. ?                       - Normal mucosa in the descending colon and in the  ?                       right colon. Biopsied. ?                       - The distal rectum and anal verge are normal on  ?  retroflexion view. ?                       - Diverticulosis in the sigmoid colon. ?Recommendation:        - Discharge patient to home (with escort). ?                       - Resume previous diet today. ?                       - Continue present medications. ?                       - Await pathology results. ?Procedure Code(s):     --- Professional --- ?                       [phone], Colonoscopy, flexible; with biopsy, single or  ?                       multiple ?Diagnosis Code(s):     --- Professional --- ?                       K63.89, Other specified diseases of intestine ?                       K52.9, Noninfective gastroenteritis and colitis,  ?                       unspecified ?CPT copyright 2019 American Medical Association. All rights reserved. ?The codes documented in this report are preliminary and upon coder  review may  ?be revised to meet current compliance requirements. ?Dr. Libby Maw ?Tanji Storrs Alger Memos MD, MD ?12/18/2021 9:55:41 AM ?This report has been signed electronically. ?Number of Addenda: 0 ?Note Initiated On: 12/18/2021 9:01 AM ?Scope Withdrawal Time: 0 hours 10 minutes 2 seconds  ?Total Procedure Duration: 0 hours 12 minutes 55 seconds  ?Estimated Blood Loss:  Estimated blood loss: none. Estimated blood loss: none. ?     [facility] ?

## 2021-12-18 NOTE — H&P (Signed)
?Arlyss Repress, MD ?[address]  ?[address]  ?[place], [place] [address]  ?Main: [phone]  ?Fax: [phone] ?Pager: [phone] ? ?Primary Care Physician:  Armando Gang, FNP ?Primary Gastroenterologist:  Dr. Arlyss Repress ? ?Pre-Procedure History & Physical: ?HPI:  Robyn Ortiz is a 36 y.o. female is here for an endoscopy and colonoscopy. ?  ?Past Medical History:  ?Diagnosis Date  ? Diverticulitis   ? GERD (gastroesophageal reflux disease)   ? History of bleeding ulcers   ? told not to take ibuprofen  ? History of bronchitis 26yrs ago  ? Migraine   ? Obesity   ? Sleep apnea   ? CPAP  ? ? ?Past Surgical History:  ?Procedure Laterality Date  ? CESAREAN SECTION N/A 04/08/2015  ? Procedure: CESAREAN SECTION;  Surgeon: Catalina Antigua, MD;  Location: [facility];  Service: Obstetrics;  Laterality: N/A;  ? CESAREAN SECTION WITH BILATERAL TUBAL LIGATION  04/08/2015  ? CHOLECYSTECTOMY  03-27-14  ? Dr Lemar Livings  ? ESOPHAGOGASTRODUODENOSCOPY N/A 05/19/2017  ? Procedure: ESOPHAGOGASTRODUODENOSCOPY (EGD);  Surgeon: Toney Reil, MD;  Location: [facility];  Service: Endoscopy;  Laterality: N/A;  ? TONSILLECTOMY  03/10/2012  ? Procedure: TONSILLECTOMY;  Surgeon: Suzanna Obey, MD;  Location: [facility] OR;  Service: ENT;  Laterality: Bilateral;  ? ? ?Prior to Admission medications   ?Medication Sig Start Date End Date Taking? Authorizing Provider  ?celecoxib (CELEBREX) 200 MG capsule Take by mouth. 05/01/21  Yes [provider]  ?montelukast (SINGULAIR) 10 MG tablet Take 10 mg by mouth daily. 09/23/21  Yes [provider]  ?SPIRIVA RESPIMAT 1.25 MCG/ACT AERS SMARTSIG:2 Puff(s) By Mouth Daily 05/08/21  Yes [provider]  ?[place] 160-4.5 MCG/ACT inhaler SMARTSIG:2 Puff(s) By Mouth Twice Daily 03/14/21  Yes [provider]  ?acetaminophen (TYLENOL) 325 MG tablet Take 650 mg by mouth every 6 (six) hours as needed for moderate pain.    [provider]  ?ASPIRIN LOW DOSE 81 MG  EC tablet PLEASE SEE ATTACHED FOR DETAILED DIRECTIONS ?Patient not taking: Reported on 11/26/2021 04/23/21   [provider]  ?busPIRone (BUSPAR) 7.5 MG tablet Take 7.5 mg by mouth 2 (two) times daily. ?Patient not taking: Reported on 11/26/2021 10/20/21   [provider]  ?cetirizine (ZYRTEC) 10 MG tablet Take 10 mg by mouth daily. 04/04/17   [provider]  ?cholestyramine (QUESTRAN) 4 g packet TAKE 1 PACKET BY MOUTH 2 TIMES DAILY. 10/07/21   Toney Reil, MD  ?cyclobenzaprine (FLEXERIL) 10 MG tablet Take 1 tablet (10 mg total) by mouth 2 (two) times daily as needed for muscle spasms. ?Patient not taking: Reported on 11/26/2021 01/16/21   Robinson, [place] N, PA-C  ?cyclobenzaprine (FLEXERIL) 10 MG tablet Take 1 tablet by mouth twice daily as needed for muscle spasms ?Patient not taking: Reported on 05/19/2021 01/16/21   Robinson, [place] N, PA-C  ?DENTA 5000 PLUS 1.1 % CREA dental cream Take by mouth at bedtime. 10/09/21   [provider]  ?dicyclomine (BENTYL) 10 MG capsule Take 1 capsule (10 mg total) by mouth 4 (four) times daily -  before meals and at bedtime. 10/06/17 11/05/17  Toney Reil, MD  ?fluticasone (FLONASE) 50 MCG/ACT nasal spray Place 1 spray into both nostrils daily. ?Patient not taking: Reported on 11/26/2021 10/26/21   [provider]  ?meloxicam (MOBIC) 15 MG tablet Take 15 mg by mouth daily. ?Patient not taking: Reported on 11/26/2021 10/20/21   [provider]  ?NATURAL VITAMIN D-3 125  MCG (5000 UT) TABS Take 1 tablet by mouth daily. ?Patient not taking: Reported on 12/18/2021 02/27/21   [provider]  ?nicotine (NICODERM CQ - DOSED IN MG/24 HR) 7 mg/24hr patch Place 7 mg onto the skin daily. ?Patient not taking: Reported on 11/26/2021 05/31/21   [provider]  ?NURTEC 75 MG TBDP Take 1 tablet by mouth every other day as needed. ?Patient not taking: Reported on 11/26/2021 03/20/21   [provider]  ?ondansetron (ZOFRAN) 4  MG tablet Take 4 mg by mouth every 8 (eight) hours as needed. ?Patient not taking: Reported on 12/18/2021 08/12/21   [provider]  ?PROAIR HFA 108 [phone] Base) MCG/ACT inhaler Inhale into the lungs. ?Patient not taking: Reported on 12/18/2021 05/16/21   [provider]  ?valACYclovir (VALTREX) 1000 MG tablet Take 4,000 mg by mouth once. ?Patient not taking: Reported on 12/18/2021 05/15/21   [provider]  ? ? ?Allergies as of 11/26/2021 - Review Complete 11/26/2021  ?Allergen Reaction Noted  ? Codeine Other (See Comments) 01/26/2012  ? Ibuprofen  11/09/2012  ? Latex  08/12/2021  ? ? ?Family History  ?Problem Relation Age of Onset  ? Diabetes Father   ? Bipolar disorder Mother   ? Heart disease Paternal Grandmother   ? Cancer Paternal Grandfather   ?     intestional cancer  ? Other Paternal Grandfather   ?     deteriorating disc  ? ? ?Social History  ? ?Socioeconomic History  ? Marital status: Married  ?  Spouse name: Not on file  ? Number of children: Not on file  ? Years of education: Not on file  ? Highest education level: Not on file  ?Occupational History  ? Not on file  ?Tobacco Use  ? Smoking status: Every Day  ?  Packs/day: 1.00  ?  Years: 13.00  ?  Pack years: 13.00  ?  Types: Cigarettes  ? Smokeless tobacco: Never  ?Vaping Use  ? Vaping Use: Never used  ?Substance and Sexual Activity  ? Alcohol use: Yes  ?  Comment: beer occasionally  ? Drug use: No  ? Sexual activity: Yes  ?  Birth control/protection: Surgical  ?  Comment: BTL  ?Other Topics Concern  ? Not on file  ?Social History Narrative  ? Not on file  ? ?Social Determinants of Health  ? ?Financial Resource Strain: Not on file  ?Food Insecurity: Not on file  ?Transportation Needs: Not on file  ?Physical Activity: Not on file  ?Stress: Not on file  ?Social Connections: Not on file  ?Intimate Partner Violence: Not on file  ? ? ?Review of Systems: ?See HPI, otherwise negative ROS ? ?Physical Exam: ?BP 120/79   Pulse 73   Temp 97.9  ?F (36.6 ?C) (Temporal)   Resp 20   Ht 5\' 5"  (1.651 m)   Wt 104.3 kg   SpO2 99%   BMI 38.27 kg/m?  ?General:   Alert,  pleasant and cooperative in NAD ?Head:  Normocephalic and atraumatic. ?Neck:  Supple; no masses or thyromegaly. ?Lungs:  Clear throughout to auscultation.    ?Heart:  Regular rate and rhythm. ?Abdomen:  Soft, nontender and nondistended. Normal bowel sounds, without guarding, and without rebound.   ?Neurologic:  Alert and  oriented x4;  grossly normal neurologically. ? ?Impression/Plan: ?Ruble Malkowski is here for an endoscopy and colonoscopy to be performed for h/o recurrent C Difficile infection, chronic epigastric pain ? ?Risks, benefits, limitations, and alternatives regarding  endoscopy and colonoscopy have been reviewed with the patient.  Questions have been answered.  All parties agreeable. ? ? ?Lannette Donathohini Ozie Dimaria, MD  12/18/2021, 9:15 AM ?

## 2021-12-18 NOTE — Transfer of Care (Signed)
Immediate Anesthesia Transfer of Care Note ? ?Patient: Robyn Ortiz ? ?Procedure(s) Performed: COLONOSCOPY WITH PROPOFOL ?ESOPHAGOGASTRODUODENOSCOPY (EGD) WITH PROPOFOL ? ?Patient Location: Endoscopy Unit ? ?Anesthesia Type:General ? ?Level of Consciousness: awake, drowsy and patient cooperative ? ?Airway & Oxygen Therapy: Patient Spontanous Breathing and Patient connected to face mask oxygen ? ?Post-op Assessment: Report given to RN and Post -op Vital signs reviewed and stable ? ?Post vital signs: Reviewed and stable ? ?Last Vitals:  ?Vitals Value Taken Time  ?BP 88/54 12/18/21 0954  ?Temp 36.3 ?C 12/18/21 0953  ?Pulse 77 12/18/21 0956  ?Resp 22 12/18/21 0956  ?SpO2 98 % 12/18/21 0956  ?Vitals shown include unvalidated device data. ? ?Last Pain:  ?Vitals:  ? 12/18/21 0953  ?TempSrc: Temporal  ?PainSc: Asleep  ?   ? ?  ? ?Complications: No notable events documented. ?

## 2021-12-19 ENCOUNTER — Encounter: Payer: Self-pay | Admitting: Gastroenterology

## 2021-12-19 LAB — SURGICAL PATHOLOGY

## 2021-12-22 ENCOUNTER — Telehealth: Payer: Self-pay

## 2021-12-22 NOTE — Telephone Encounter (Signed)
-----   Message from Lin Landsman, MD sent at 12/19/2021 12:49 PM EDT ----- ?The pathology results from upper endoscopy and colonoscopy came back normal.  She has to take omeprazole 40 mg once a day as prescribed for gastric erosions.  I also advised patient to drop off the stool specimen to rule out C. difficile again.  Please remind her to do so ? ?Thank you ?Rohini Vanga ?

## 2021-12-22 NOTE — Telephone Encounter (Signed)
Patient verbalized understanding of results. She states she will drop off the sample as soon as she can  ?

## 2021-12-22 NOTE — Telephone Encounter (Signed)
Tried to call patient but mailbox is full  

## 2022-01-09 ENCOUNTER — Other Ambulatory Visit: Payer: Self-pay | Admitting: Gastroenterology

## 2022-01-19 ENCOUNTER — Other Ambulatory Visit: Payer: Self-pay | Admitting: Gastroenterology

## 2022-01-31 ENCOUNTER — Other Ambulatory Visit: Payer: Self-pay | Admitting: Gastroenterology

## 2022-01-31 DIAGNOSIS — K9089 Other intestinal malabsorption: Secondary | ICD-10-CM

## 2022-02-05 ENCOUNTER — Emergency Department
Admission: EM | Admit: 2022-02-05 | Discharge: 2022-02-06 | Disposition: A | Discharge status: Home / Self Care | Payer: Medicaid Other | Attending: Emergency Medicine | Admitting: Emergency Medicine

## 2022-02-05 ENCOUNTER — Encounter: Payer: Self-pay | Admitting: Emergency Medicine

## 2022-02-05 ENCOUNTER — Emergency Department: Payer: Medicaid Other

## 2022-02-05 DIAGNOSIS — R0789 Other chest pain: Secondary | ICD-10-CM | POA: Insufficient documentation

## 2022-02-05 DIAGNOSIS — R110 Nausea: Secondary | ICD-10-CM | POA: Insufficient documentation

## 2022-02-05 LAB — CBC
HCT: 38.5 % (ref 36.0–46.0)
Hemoglobin: 12.6 g/dL (ref 12.0–15.0)
MCH: 29.2 pg (ref 26.0–34.0)
MCHC: 32.7 g/dL (ref 30.0–36.0)
MCV: 89.1 fL (ref 80.0–100.0)
Platelets: 274 10*3/uL (ref 150–400)
RBC: 4.32 MIL/uL (ref 3.87–5.11)
RDW: 11.9 % (ref 11.5–15.5)
WBC: 5.8 10*3/uL (ref 4.0–10.5)
nRBC: 0 % (ref 0.0–0.2)

## 2022-02-05 LAB — BASIC METABOLIC PANEL
Anion gap: 5 (ref 5–15)
BUN: 17 mg/dL (ref 6–20)
CO2: 24 mmol/L (ref 22–32)
Calcium: 9.1 mg/dL (ref 8.9–10.3)
Chloride: 112 mmol/L — ABNORMAL HIGH (ref 98–111)
Creatinine, Ser: 0.54 mg/dL (ref 0.44–1.00)
GFR, Estimated: >60 mL/min (ref >60)
Glucose, Bld: 82 mg/dL (ref 70–99)
Potassium: 3.3 mmol/L — ABNORMAL LOW (ref 3.5–5.1)
Sodium: 141 mmol/L (ref 135–145)

## 2022-02-05 LAB — URINALYSIS, ROUTINE W REFLEX MICROSCOPIC
Bilirubin Urine: NEGATIVE
Glucose, UA: NEGATIVE mg/dL
Hgb urine dipstick: NEGATIVE
Ketones, ur: NEGATIVE mg/dL
Leukocytes,Ua: NEGATIVE
Nitrite: NEGATIVE
Protein, ur: NEGATIVE mg/dL
Specific Gravity, Urine: 1.021 (ref 1.005–1.030)
pH: 6 (ref 5.0–8.0)

## 2022-02-05 LAB — POC URINE PREG, ED: Preg Test, Ur: NEGATIVE

## 2022-02-05 LAB — TROPONIN I (HIGH SENSITIVITY): Troponin I (High Sensitivity): <2 ng/L (ref <18)

## 2022-02-05 NOTE — ED Triage Notes (Signed)
Pt presents via POV with complaints of CP that started 3 hours ago. She notes the pain is midsternal and it radiates to the left side of her chest. She describes the pain as "tightness". No meds taken PTA. Hx of GERD and takes Prilosec and states the pain is different than her GERD sx. Denies SOB, N/V.

## 2022-02-06 LAB — TROPONIN I (HIGH SENSITIVITY): Troponin I (High Sensitivity): <2 ng/L (ref <18)

## 2022-02-23 ENCOUNTER — Ambulatory Visit (INDEPENDENT_AMBULATORY_CARE_PROVIDER_SITE_OTHER): Payer: Medicaid Other | Admitting: Cardiology

## 2022-02-23 ENCOUNTER — Encounter: Payer: Self-pay | Admitting: Cardiology

## 2022-02-23 DIAGNOSIS — E669 Obesity, unspecified: Secondary | ICD-10-CM | POA: Diagnosis not present

## 2022-02-23 DIAGNOSIS — R0789 Other chest pain: Secondary | ICD-10-CM | POA: Diagnosis not present

## 2022-02-23 MED ORDER — PREDNISONE 10 MG (21) PO TBPK: ORAL_TABLET | ORAL | 0 refills | Status: DC

## 2022-02-23 NOTE — Progress Notes (Unsigned)
Primary Care Provider: Armando Gang, FNP [facility] Cardiologist: None Electrophysiologist: None  Clinic Note: Chief Complaint  Patient presents with   New Patient (Initial Visit)   Chest Pain    Atypical/chest wall.   Headache   Shortness of Breath   Edema    ===================================  ASSESSMENT/PLAN   Problem List Items Addressed This Visit     Anterior chest wall pain (Chronic)    ===================================  HPI:    Robyn Ortiz is a 36 y.o. female who is being seen today for the evaluation of *** at the request of Robyn Rose, MD.  Robyn Ortiz was last seen on ***  Recent Hospitalizations: ***  Reviewed  CV studies:    The following studies were reviewed today: (if available, images/films reviewed: From Epic Chart or Care Everywhere) ***:   Interval History:   Robyn Ortiz   CV Review of Symptoms (Summary) Cardiovascular ROS: {roscv:310661}  REVIEWED OF SYSTEMS   ROS  I have reviewed and (if needed) personally updated the patient's problem list, medications, allergies, past medical and surgical history, social and family history.   PAST MEDICAL HISTORY   Past Medical History:  Diagnosis Date   Diverticulitis    GERD (gastroesophageal reflux disease)    History of bleeding ulcers    told not to take ibuprofen   History of bronchitis 30yrs ago   Migraine    Obesity    Sleep apnea    CPAP    PAST SURGICAL HISTORY   Past Surgical History:  Procedure Laterality Date   CESAREAN SECTION N/A 04/08/2015   Procedure: CESAREAN SECTION;  Surgeon: Catalina Antigua, MD;  Location: [facility];  Service: Obstetrics;  Laterality: N/A;   CESAREAN SECTION WITH BILATERAL TUBAL LIGATION  04/08/2015   CHOLECYSTECTOMY  03-27-14   Dr Lemar Livings   COLONOSCOPY WITH PROPOFOL N/A 12/18/2021   Procedure: COLONOSCOPY WITH PROPOFOL;  Surgeon: Toney Reil, MD;  Location: [facility];  Service: Gastroenterology;  Laterality: N/A;    ESOPHAGOGASTRODUODENOSCOPY N/A 05/19/2017   Procedure: ESOPHAGOGASTRODUODENOSCOPY (EGD);  Surgeon: Toney Reil, MD;  Location: [facility];  Service: Endoscopy;  Laterality: N/A;   ESOPHAGOGASTRODUODENOSCOPY (EGD) WITH PROPOFOL N/A 12/18/2021   Procedure: ESOPHAGOGASTRODUODENOSCOPY (EGD) WITH PROPOFOL;  Surgeon: Toney Reil, MD;  Location: [facility] END[facility]COPY;  Service: Gastroenterology;  Laterality: N/A;   TONSILLECTOMY  03/10/2012   Procedure: TONSILLECTOMY;  Surgeon: Suzanna Obey, MD;  Location: [facility] OR;  Service: ENT;  Laterality: Bilateral;    Immunization History  Administered Date(s) Administered   Influenza, Seasonal, Injecte, Preservative Fre 05/02/2013   Influenza,inj,Quad PF,6+ Mos 04/23/2015   Influenza-Unspecified 06/17/2014   Pneumococcal Polysaccharide-23 06/19/2013   Tdap 04/04/2013, 01/18/2015    MEDICATIONS/ALLERGIES   Current Meds  Medication Sig   acetaminophen (TYLENOL) 325 MG tablet Take 650 mg by mouth every 6 (six) hours as needed for moderate pain.   celecoxib (CELEBREX) 200 MG capsule Take by mouth.   cetirizine (ZYRTEC) 10 MG tablet Take 10 mg by mouth daily.   cholestyramine (QUESTRAN) 4 g packet TAKE 1 PACKET BY MOUTH 2 TIMES DAILY.   DENTA 5000 PLUS 1.1 % CREA dental cream Take by mouth at bedtime.   dicyclomine (BENTYL) 10 MG capsule Take 1 capsule (10 mg total) by mouth 4 (four) times daily -  before meals and at bedtime.   montelukast (SINGULAIR) 10 MG tablet Take 10 mg by mouth daily.   nicotine (NICODERM CQ - DOSED IN MG/24 HR) 7 mg/24hr patch Place 7  mg onto the skin daily.   omeprazole (PRILOSEC) 40 MG capsule TAKE 1 CAPSULE (40 MG TOTAL) BY MOUTH DAILY.   PROAIR HFA 108 (90 Base) MCG/ACT inhaler Inhale into the lungs.   SPIRIVA RESPIMAT 1.25 MCG/ACT AERS SMARTSIG:2 Puff(s) By Mouth Daily   SYMBICORT 160-4.5 MCG/ACT inhaler SMARTSIG:2 Puff(s) By Mouth Twice Daily    Allergies  Allergen Reactions   Codeine Other (See Comments)     Pt. States she had a seizure   Ibuprofen     Patient was diagnosed with bleeding ulcers and advised not to take this medication.   Latex     SOCIAL HISTORY/FAMILY HISTORY   Reviewed in Epic:   Social History   Tobacco Use   Smoking status: Every Day    Packs/day: 1.00    Years: 13.00    Total pack years: 13.00    Types: Cigarettes   Smokeless tobacco: Never  Vaping Use   Vaping Use: Never used  Substance Use Topics   Alcohol use: Yes    Comment: beer occasionally   Drug use: No   Social History   Social History Narrative   Not on file   Family History  Problem Relation Age of Onset   Diabetes Father    Bipolar disorder Mother    Heart disease Paternal Grandmother    Cancer Paternal Grandfather        intestional cancer   Other Paternal Grandfather        deteriorating disc    OBJCTIVE -PE, EKG, labs   Wt Readings from Last 3 Encounters:  02/23/22 209 lb (94.8 kg)  02/05/22 216 lb (98 kg)  12/18/21 230 lb (104.3 kg)    Physical Exam: BP (!) 90/58 (BP Location: Left Arm, Patient Position: Sitting, Cuff Size: Large)   Pulse 72   Ht 5\' 5"  (1.651 m)   Wt 209 lb (94.8 kg)   LMP 01/26/2022 (Exact Date)   BMI 34.78 kg/m  Physical Exam   Adult ECG Report  Rate: *** ;  Rhythm: {rhythm:17366};   Narrative Interpretation: ***  Recent Labs:  ***  No results found for: "CHOL", "HDL", "LDLCALC", "LDLDIRECT", "TRIG", "CHOLHDL" Lab Results  Component Value Date   CREATININE 0.54 02/05/2022   BUN 17 02/05/2022   NA 141 02/05/2022   K 3.3 (L) 02/05/2022   CL 112 (H) 02/05/2022   CO2 24 02/05/2022      Latest Ref Rng & Units 02/05/2022    9:53 PM 11/26/2021    3:35 PM 01/16/2021    2:52 PM  CBC  WBC 4.0 - 10.5 K/uL 5.8  5.8  6.2   Hemoglobin 12.0 - 15.0 g/dL 03/18/2021  83.1  51.7   Hematocrit 36.0 - 46.0 % 38.5  41.7  43.3   Platelets 150 - 400 K/uL 274  289  274     No results found for: "HGBA1C" Lab Results  Component Value Date   TSH 1.503 04/23/2015     ================================================== I spent a total of *** minutes with the patient spent in direct patient consultation.  Additional time spent with chart review  / charting (studies, outside notes, etc): *** min Total Time: *** min  Current medicines are reviewed at length with the patient today.  (+/- concerns) ***  Notice: This dictation was prepared with Dragon dictation along with smart phrase technology. Any transcriptional errors that result from this process are unintentional and may not be corrected upon review.   Studies Ordered:  No orders  of the defined types were placed in this encounter.  No orders of the defined types were placed in this encounter.   Shared Decision Making/Informed Consent{ All outpatient stress tests require an informed consent ([id number]) ATTESTATION ORDER       :[id number]} The risks [chest pain, shortness of breath, cardiac arrhythmias, dizziness, blood pressure fluctuations, myocardial infarction, stroke/transient ischemic attack, and life-threatening complications (estimated to be 1 in 10,000)], benefits (risk stratification, diagnosing coronary artery disease, treatment guidance) and alternatives of an exercise tolerance test were discussed in detail with Robyn Ortiz and she agrees to proceed.   Patient Instructions / Medication Changes & Studies & Tests Ordered   There are no Patient Instructions on file for this visit.    Bryan Lemma, M.D., M.S. Interventional Cardiologist   Pager # [phone] Phone # [phone] [address] [place], [place] [address]   Thank you for choosing [facility] at [facility]!!

## 2022-02-23 NOTE — Patient Instructions (Addendum)
Medication Instructions:    Sterapred 21 tablet pak - follow instructions  *If you need a refill on your cardiac medications before your next appointment, please call your pharmacy*   Lab Work: Not needed    Testing/Procedures: Will be schedule for next week at [address]  Your physician has requested that you have an exercise tolerance test.information please  Please also follow instruction sheet, as given.   Do not drink or eat foods with caffeine for 24 hours before the test. (Chocolate, coffee, tea, or energy drinks) If you use an inhaler, bring it with you to the test. Do not smoke for 4 hours before the test. Wear comfortable shoes and clothing.   Follow-Up: At [facility], you and your health needs are our priority.  As part of our continuing mission to provide you with exceptional heart care, we have created designated Provider Care Teams.  These Care Teams include your primary Cardiologist (physician) and Advanced Practice Providers (APPs -  Physician Assistants and Nurse Practitioners) who all work together to provide you with the care you need, when you need it.     Your next appointment:   2 month(s)  The format for your next appointment:   In Person  Provider:   Bryan Lemma, MD

## 2022-02-26 ENCOUNTER — Encounter: Payer: Self-pay | Admitting: Cardiology

## 2022-02-26 NOTE — Assessment & Plan Note (Signed)
We talked about her weight and how this is affecting her symptoms she is having now but also will need TAVR significant for baseline She is also smoking.  Smoking cessation and weight loss discussed.  Bryan Lemma, MD

## 2022-02-26 NOTE — Assessment & Plan Note (Signed)
She presents with chest pain that was a prolonged spell she ruled out for MI after having several days of chest pain makes it less likely cardiac in nature.  It was not made worse with exertion, began after eating driving home after eating.  Is worse with certain arm movements and worse with deep inspiration.  Reproducible on exam.  My assessment is that I think this is probably costochondritis and we will treat with prednisone taper -> 60 x 2 days to 40 x 2 days x 2 days 20 sensitive tenderness 2 days and stop.  (Basically 2 Dosepaks)  Since she is taking NSAIDs for pain and also has GI issues with gastritis and leery of recommending NSAIDs, would use Tylenol in the future.  Just exclude CAD we will have her do a treadmill GXT.  Please see Informed/Shared Decision-Making Statementbelow

## 2022-03-02 ENCOUNTER — Telehealth: Payer: Self-pay | Admitting: Cardiology

## 2022-03-02 NOTE — Telephone Encounter (Signed)
Pt c/o medication issue:  1. Name of Medication:  predniSONE (STERAPRED UNI-PAK 21 TAB) 10 MG (21) TBPK tablet  2. How are you currently taking this medication (dosage and times per day)? Not currently taking  3. Are you having a reaction (difficulty breathing--STAT)? No   4. What is your medication issue? Patient is stating she was just able to pickup this medication and is wanting to know if it is okay to start it now.

## 2022-03-02 NOTE — Telephone Encounter (Signed)
Called patient, advised per last OV with Dr.Harding to start Prednisone pack to see if it helped with chest wall pain. Patient verbalized understanding. She will go ahead and start.

## 2022-03-05 ENCOUNTER — Telehealth (HOSPITAL_COMMUNITY): Payer: Self-pay | Admitting: *Deleted

## 2022-03-05 NOTE — Telephone Encounter (Signed)
Close encounter 

## 2022-03-06 ENCOUNTER — Ambulatory Visit (HOSPITAL_COMMUNITY)
Admission: RE | Admit: 2022-03-06 | Discharge: 2022-03-06 | Disposition: A | Discharge status: Home / Self Care | Payer: Medicaid Other | Source: Ambulatory Visit | Attending: Cardiovascular Disease | Admitting: Cardiovascular Disease

## 2022-03-06 DIAGNOSIS — R0789 Other chest pain: Secondary | ICD-10-CM | POA: Diagnosis present

## 2022-03-06 HISTORY — PX: OTHER SURGICAL HISTORY: SHX169

## 2022-03-06 LAB — EXERCISE TOLERANCE TEST
Angina Index: 0
Base ST Depression (mm): 0 mm
Duke Treadmill Score: 10
Estimated workload: 12.3
Exercise duration (min): 10 min
Exercise duration (sec): 24 s
MPHR: 186 {beats}/min
Peak HR: 169 {beats}/min
Percent HR: 91 %
Rest HR: 71 {beats}/min
ST Depression (mm): 0 mm

## 2022-03-13 ENCOUNTER — Ambulatory Visit: Payer: Medicaid Other | Admitting: Cardiology

## 2022-04-06 ENCOUNTER — Other Ambulatory Visit: Payer: Self-pay

## 2022-04-13 ENCOUNTER — Ambulatory Visit (INDEPENDENT_AMBULATORY_CARE_PROVIDER_SITE_OTHER): Payer: Medicaid Other | Admitting: Gastroenterology

## 2022-04-13 ENCOUNTER — Encounter: Payer: Self-pay | Admitting: Gastroenterology

## 2022-04-13 VITALS — BP 118/77 | HR 80 | Temp 98.3°F | Ht 65.0 in | Wt 202.4 lb

## 2022-04-13 DIAGNOSIS — K5909 Other constipation: Secondary | ICD-10-CM

## 2022-04-13 DIAGNOSIS — K648 Other hemorrhoids: Secondary | ICD-10-CM | POA: Diagnosis not present

## 2022-04-13 DIAGNOSIS — K625 Hemorrhage of anus and rectum: Secondary | ICD-10-CM

## 2022-04-13 NOTE — Progress Notes (Signed)
Arlyss Repress, MD [address]  [address]  [place], [place] [address]  Main: [phone]  Fax: [phone]    Gastroenterology Consultation  Referring Provider:     Armando Gang, FNP Primary Care Physician:  Armando Gang, FNP Primary Gastroenterologist:  Dr. Arlyss Repress Reason for Consultation: Rectal bleeding        HPI:   Robyn Ortiz is a 36 y.o. female referred by Dr. Armando Gang, FNP  for consultation & management of Epigastric pain and right upper quadrant pain, sharp knot-like sensation worse after eating associated with nausea. She has been having these symptoms for at least 40 years but got worse after her last child was born 2 years ago. She has been taking ibuprofen 600 mg twice daily both for abdominal pain and headache. She has been gaining weight, denies loss of appetite, melena, rectal bleeding, trouble swallowing. She is also suffering from nonbloody diarrhea, after each meal since she had a cholecystectomy. She denies hematochezia, blood mixed with stool, lower abdominal pain, urgency, constipation. She had gallbladder removal for her upper abdominal symptoms, however her symptoms continued. She smokes daily but does not drink alcohol. She takes Protonix 40 mg daily.   Follow-up visit 10/06/2017: Patient underwent EGD which was unremarkable. There was no evidence of H. pylori infection. She tried cholestyramine which resulted in constipation. She is taking Protonix as needed for heartburn. She continues to have epigastric pain, abdominal cramps and frequent bowel movements with bloating. She drinks sodas, one can a day. She consumes red meat daily. She has gained weight since switching her job. She continues to smoke cigarettes. She is with her husband today. Her CBC and ferritin were normal. Celiac serologies were negative. She does have chronic history of headaches and takes BC powder as needed.  Follow-up visit 12/08/2017 She lost about 15  pounds intentionally since last visit the following healthy diet. She continues to have increased bowel frequency associated with abdominal cramps. She is taking cholestyramine daily and does not take on Saturday which results in severe diarrhea. She does not have a bowel movement while on cholestyramine. She is on amitriptyline 25 mg at bedtime. She tried FD guard which did not help. She continues to smoke.   She takes care of 6 kids, 3 are adopted  Follow-up visit 05/19/2021 Patient has not seen me in 3 years.  She is here because of recurrence of diarrhea.  She reports that her diarrhea has recurred for more than a year after she ran out of cholestyramine that she was taking before.  She stopped amitriptyline.  She underwent work-up of chronic diarrhea including stool studies to rule out infection which was negative, celiac panel was negative, C. difficile was negative, no evidence of iron deficiency.  Patient reports that she has about 4-5 episodes of watery bowel movements daily.  She reports that even water runs through her.  She stopped drinking sodas.  She likes to eat cheese.  She denies any weight loss.  No evidence of anemia.  She has occasional diarrhea at night as well.  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  She does smoke half pack per day  Follow-up visit 11/26/2021 Patient is here for follow-up of ongoing diarrhea and rectal bleeding.  She also reports epigastric pain.  She was diagnosed with C. difficile infection in October 2022, treated with 10 days course of oral vancomycin 125 mg 4 times  daily.  Patient reports that her diarrhea improved when she was on antibiotics.  It then recurred, has been taking cholestyramine twice daily which keeps her bowels soft and mushy.  She has been noticing blood mixed with the stool as well and her stools are foul-smelling.  Her weight has been stable in fact gained some weight since she has been staying  home.  She does report epigastric burning pain.  She has not tried any acid reflux medication.  Follow-up visit 04/05/2022 Patient is here for follow-up of rectal bleeding.  She is currently constipated, having hard bowel movements and sometimes formed, associated with straining.  Patient is taking cholestyramine 1 pack twice daily which she thinks is attributing to hard bowel movements.  She stopped taking cholestyramine 2 days ago, has not had a bowel movement in last 2 days.  She reports some rectal discomfort associated with bright red blood per rectum, dripping into the toilet bowel and on wiping.  She has tried over-the-counter hemorrhoidal wipes and topical creams which did not provide any relief.  She does not want to stop cholestyramine because it results in severe diarrhea.  She tried MiraLAX in the past that has led to severe diarrhea.  She is following healthy diet and exercise, continues to lose weight.  She continues to smoke.  She also reports that her epigastric pain has significantly improved, currently on omeprazole 40 mg daily.  She underwent EGD which revealed gastric erosions and mild erosive esophagitis.  Colonoscopy was unremarkable including biopsies.  She denies family history of GI malignancy GI Procedures: EGD 06/05/2017 normal  EGD and colonoscopy 12/18/2021 - Normal duodenal bulb and second portion of the duodenum. Biopsied. - Erosive gastropathy with no bleeding and no stigmata of recent bleeding. Biopsied. - Normal gastric body. Biopsied. - Esophagogastric landmarks identified. - LA Grade A reflux esophagitis with no bleeding.  - The examined portion of the ileum was normal. - Erythematous mucosa in the sigmoid colon. Biopsied. - Normal mucosa in the descending colon and in the right colon. Biopsied. - The distal rectum and anal verge are normal on retroflexion view. - Diverticulosis in the sigmoid colon. DIAGNOSIS:  A. DUODENUM; COLD BIOPSY:  - ENTERIC MUCOSA WITH  PRESERVED VILLOUS ARCHITECTURE AND NO SIGNIFICANT  HISTOPATHOLOGIC CHANGE.  - NEGATIVE FOR FEATURES OF CELIAC, DYSPLASIA, AND MALIGNANCY.   B. STOMACH; COLD BIOPSY:  - GASTRIC ANTRAL AND OXYNTIC MUCOSA WITH FEATURES OF MILD REACTIVE  GASTROPATHY.  - NEGATIVE FOR H. PYLORI, DYSPLASIA, AND MALIGNANCY.   Comment:  The differential diagnosis for these findings includes drug/chemical  injury (NSAID vs. other), bile reflux, and changes adjacent to an area  of healing ulceration. Clinical correlation with endoscopic findings is  required.   C. COLON, RIGHT; COLD BIOPSY:  - BENIGN COLONIC MUCOSA WITH NO SIGNIFICANT HISTOPATHOLOGIC CHANGE.  - NEGATIVE FOR ACTIVE MUCOSAL COLITIS, FEATURES OF MICROSCOPIC COLITIS,  AND ARCHITECTURAL CHANGES OF CHRONICITY.  - NEGATIVE FOR DYSPLASIA AND MALIGNANCY.   D. COLON, DESCENDING; COLD BIOPSY:  - BENIGN COLONIC MUCOSA WITH NO SIGNIFICANT HISTOPATHOLOGIC CHANGE.  - NEGATIVE FOR ACTIVE MUCOSAL COLITIS, FEATURES OF MICROSCOPIC COLITIS,  AND ARCHITECTURAL CHANGES OF CHRONICITY.  - NEGATIVE FOR DYSPLASIA AND MALIGNANCY.   E. COLON ERYTHEMA, SIGMOID; COLD BIOPSY:  - BENIGN COLONIC MUCOSA WITH MILD LAMINA PROPRIA FIBROSIS AND  SUPERFICIAL REACTIVE/HYPERPLASTIC CHANGES.  - NEGATIVE FOR ACTIVE MUCOSAL COLITIS, DYSPLASIA, AND MALIGNANCY.   Past Medical History:  Diagnosis Date   Diverticulitis    GERD (gastroesophageal reflux disease)  History of bleeding ulcers    told not to take ibuprofen   History of bronchitis 74yrs ago   Migraine    Obesity    Sleep apnea    CPAP    Past Surgical History:  Procedure Laterality Date   CESAREAN SECTION N/A 04/08/2015   Procedure: CESAREAN SECTION;  Surgeon: Catalina Antigua, MD;  Location: [facility];  Service: Obstetrics;  Laterality: N/A;   CESAREAN SECTION WITH BILATERAL TUBAL LIGATION  04/08/2015   CHOLECYSTECTOMY  03-27-14   Dr Lemar Livings   COLONOSCOPY WITH PROPOFOL N/A 12/18/2021   Procedure: COLONOSCOPY WITH  PROPOFOL;  Surgeon: Toney Reil, MD;  Location: [facility] ENDOSCOPY;  Service: Gastroenterology;  Laterality: N/A;   ESOPHAGOGASTRODUODENOSCOPY N/A 05/19/2017   Procedure: ESOPHAGOGASTRODUODENOSCOPY (EGD);  Surgeon: Toney Reil, MD;  Location: [facility];  Service: Endoscopy;  Laterality: N/A;   ESOPHAGOGASTRODUODENOSCOPY (EGD) WITH PROPOFOL N/A 12/18/2021   Procedure: ESOPHAGOGASTRODUODENOSCOPY (EGD) WITH PROPOFOL;  Surgeon: Toney Reil, MD;  Location: [facility] ENDOSCOPY;  Service: Gastroenterology;  Laterality: N/A;   TONSILLECTOMY  03/10/2012   Procedure: TONSILLECTOMY;  Surgeon: Suzanna Obey, MD;  Location: [facility] OR;  Service: ENT;  Laterality: Bilateral;     Current Outpatient Medications:    acetaminophen (TYLENOL) 325 MG tablet, Take 650 mg by mouth every 6 (six) hours as needed for moderate pain., Disp: , Rfl:    AIMOVIG 140 MG/ML SOAJ, SMARTSIG:140 Milligram(s) SUB-Q Every 4 Weeks, Disp: , Rfl:    cetirizine (ZYRTEC) 10 MG tablet, Take 10 mg by mouth daily., Disp: , Rfl: 5   cholestyramine (QUESTRAN) 4 g packet, TAKE 1 PACKET BY MOUTH 2 TIMES DAILY., Disp: 180 packet, Rfl: 1   DENTA 5000 PLUS 1.1 % CREA dental cream, Take by mouth at bedtime., Disp: , Rfl:    fluticasone (FLONASE) 50 MCG/ACT nasal spray, Place 1 spray into both nostrils daily., Disp: , Rfl:    montelukast (SINGULAIR) 10 MG tablet, Take 10 mg by mouth daily., Disp: , Rfl:    nicotine (NICODERM CQ - DOSED IN MG/24 HR) 7 mg/24hr patch, Place 7 mg onto the skin daily., Disp: , Rfl:    phentermine (ADIPEX-P) 37.5 MG tablet, Take 37.5 mg by mouth daily., Disp: , Rfl:    predniSONE (STERAPRED UNI-PAK 21 TAB) 10 MG (21) TBPK tablet, Follow direction on the package, Disp: 21 tablet, Rfl: 0   PROAIR HFA 108 (90 Base) MCG/ACT inhaler, Inhale into the lungs., Disp: , Rfl:    SPIRIVA RESPIMAT 1.25 MCG/ACT AERS, SMARTSIG:2 Puff(s) By Mouth Daily, Disp: , Rfl:    SYMBICORT 160-4.5 MCG/ACT inhaler, SMARTSIG:2 Puff(s)  By Mouth Twice Daily, Disp: , Rfl:    dicyclomine (BENTYL) 10 MG capsule, Take 1 capsule (10 mg total) by mouth 4 (four) times daily -  before meals and at bedtime., Disp: 30 capsule, Rfl: 0   omeprazole (PRILOSEC) 40 MG capsule, TAKE 1 CAPSULE (40 MG TOTAL) BY MOUTH DAILY., Disp: 90 capsule, Rfl: 0   Family History  Problem Relation Age of Onset   Diabetes Father    Bipolar disorder Mother    Heart disease Paternal Grandmother    Cancer Paternal Grandfather        intestional cancer   Other Paternal Grandfather        deteriorating disc     Social History   Tobacco Use   Smoking status: Every Day    Packs/day: 1.00    Years: 13.00    Total pack years: 13.00  Types: Cigarettes   Smokeless tobacco: Never  Vaping Use   Vaping Use: Never used  Substance Use Topics   Alcohol use: Yes    Comment: beer occasionally   Drug use: No    Allergies as of 04/13/2022 - Review Complete 04/13/2022  Allergen Reaction Noted   Codeine Other (See Comments) 01/26/2012   Ibuprofen  11/09/2012   Latex  08/12/2021    Review of Systems:    All systems reviewed and negative except where noted in HPI.   Physical Exam:  BP 118/77 (BP Location: Left Arm, Patient Position: Sitting, Cuff Size: Normal)   Pulse 80   Temp 98.3 F (36.8 C) (Oral)   Ht 5\' 5"  (1.651 m)   Wt 202 lb 6 oz (91.8 kg)   BMI 33.68 kg/m  No LMP recorded.  General:   Alert,  Well-developed, well-nourished, pleasant and cooperative in NAD Head:  Normocephalic and atraumatic. Eyes:  Sclera clear, no icterus.   Conjunctiva pink. Ears:  Normal auditory acuity. Nose:  No deformity, discharge, or lesions. Mouth:  No deformity or lesions,oropharynx pink & moist. Neck:  Supple; no masses or thyromegaly. Lungs:  Respirations even and unlabored.  Clear throughout to auscultation.   No wheezes, crackles, or rhonchi. No acute distress. Heart:  Regular rate and rhythm; no murmurs, clicks, rubs, or gallops. Abdomen:  Normal  bowel sounds.  No bruits.  Soft, non-tender and non-distended without masses, hepatosplenomegaly or hernias noted.  No guarding or rebound tenderness.   Rectal: Normal perianal exam, nontender digital rectal exam Msk:  Symmetrical without gross deformities. Good, equal movement & strength bilaterally. Pulses:  Normal pulses noted. Extremities:  No clubbing or edema.  No cyanosis. Neurologic:  Alert and oriented x3;  grossly normal neurologically. Skin:  Intact without significant lesions or rashes. No jaundice. Psych:  Alert and cooperative. Normal mood and affect.  Imaging Studies: Reviewed  Assessment and Plan:   Robyn Ortiz is a 36 y.o. female status postcholecystectomy with chronic diarrhea, diagnosed with C. difficile infection in October 2022, s/p treatment with oral vancomycin.  EGD and colonoscopy with biopsies were unremarkable for chronic diarrhea.  Diarrhea has resolved with cholestyramine. Patient now presents with approximately 3 to 4 months history of rectal bleeding with mild rectal discomfort.  Colonoscopy was unremarkable for any proctitis or colitis  Rectal bleeding Was no evidence of any anal fissure based on the rectal exam today Patient is bleeding secondary to internal hemorrhoids in setting of constipation I have discussed with patient regarding outpatient hemorrhoid ligation, the procedure, risks and benefits Consent obtained Proceed with hemorrhoid ligation today  Chronic intermittent constipation Advised patient to reduce the dose of cholestyramine to 1 pack daily Trial of MiraLAX today  Follow up in 2 weeks  Asked her to contact me via my chart as needed  Cephas Darby, MD

## 2022-04-13 NOTE — Progress Notes (Signed)

## 2022-04-16 ENCOUNTER — Encounter: Payer: Self-pay | Admitting: Cardiology

## 2022-04-16 ENCOUNTER — Ambulatory Visit: Payer: Medicaid Other | Attending: Cardiology | Admitting: Cardiology

## 2022-04-16 VITALS — BP 128/62 | HR 62 | Ht 65.0 in | Wt 202.4 lb

## 2022-04-16 DIAGNOSIS — R0789 Other chest pain: Secondary | ICD-10-CM | POA: Diagnosis not present

## 2022-04-16 DIAGNOSIS — F17200 Nicotine dependence, unspecified, uncomplicated: Secondary | ICD-10-CM

## 2022-04-16 MED ORDER — PREDNISONE 10 MG (21) PO TBPK: ORAL_TABLET | ORAL | 3 refills | Status: DC

## 2022-04-16 NOTE — Patient Instructions (Addendum)
Medication Instructions:  Take prednisone pack as needed as directed.  When you are on half pack, take Tylenol 500 mg (2 tablets) three times a day for 3 days,  2 tablet twice a day 2 days, then 250 mg (1 tablet) once a day for 1 day.    *If you need a refill on your cardiac medications before your next appointment, please call your pharmacy*   Lab Work: None ordered today   Testing/Procedures: None ordered today   Follow-Up: At [facility], you and your health needs are our priority.  As part of our continuing mission to provide you with exceptional heart care, we have created designated Provider Care Teams.  These Care Teams include your primary Cardiologist (physician) and Advanced Practice Providers (APPs -  Physician Assistants and Nurse Practitioners) who all work together to provide you with the care you need, when you need it.  We recommend signing up for the patient portal called "MyChart".  Sign up information is provided on this After Visit Summary.  MyChart is used to connect with patients for Virtual Visits (Telemedicine).  Patients are able to view lab/test results, encounter notes, upcoming appointments, etc.  Non-urgent messages can be sent to your provider as well.   To learn more about what you can do with MyChart, go to [email or web address].    Your next appointment:   6 month(s)  The format for your next appointment:   In Person  Provider:   Bryan Lemma, MD

## 2022-04-16 NOTE — Progress Notes (Signed)
Primary Care Provider: Armando Gang, FNP [facility] cardiologist: Bryan Lemma, MD Electrophysiologist: None  Clinic Note: Chief Complaint  Patient presents with   Follow-up    ETT  follow up, no new cardiac   ===================================  ASSESSMENT/PLAN   Problem List Items Addressed This Visit     Anterior chest wall pain - Primary (Chronic)    Symptoms somewhat resolved after using prednisone taper.  I do suspect this is probably most consistent with costochondritis.  Talked about using as needed Tylenol for mild symptoms, but for significant symptoms that were similar to her initial presenting symptoms, we talked about having a PRN prednisone Dosepak to use just in case.  She will actually combine PRN x-ray Tylenol with the prednisone when she gets to the end of the Dosepak and then continue that for several days downstream.  See patient instructions.      Relevant Orders   EKG 12-Lead (Completed)   Current smoker (Chronic)    We did spend some time talk about smoking cessation.  This is often contributing to some of her dyspnea.  Not necessarily to the chest discomfort however.  She realizes she needs to quit, but has not yet worked on efforts to quit.       ===================================  HPI:    Robyn Ortiz is a 36 y.o. female with a PMH below who presents today for 6 wk f/u discuss results of GXT(ETT) evaluate chest pain.Robyn Ortiz was seen on February 23, 2022 for ER visit follow-up to evaluate anterior chest wall pain.  Clinically sounds like costochondritis.  Treated with prednisone taper.  (Chosen over NSAIDs because of ongoing GI issues.)  Chest pain seem to come and go over 3 days.  Center of the chest radiating to the left shoulder.  With driving.  Also noticed some forceful pounding heartbeats.  Recent Hospitalizations: None  Reviewed  CV studies:    The following studies were reviewed today: (if available,  images/films reviewed: From Epic Chart or Care Everywhere) GXT 03/06/2022: Good exercise tolerance-10:24 min.  Reached peak heart rate of 169 bpm equals 91% MPHR at 186 bpm.  No EKG changes, chest pain or dyspnea.  Interval History:   Robyn Ortiz returns here today stating that she did definitely feel better after he steroid taper, but so every now and then has chest pain not every day and not exertional.  Not associate with any dyspnea, just very bothersome to her.  Essentially negative cardiac review of symptoms other than the resting chest pain usually worse with lying down on her side, always happening at rest.  CV Review of Symptoms (Summary) Cardiovascular ROS: positive for - chest pain negative for - dyspnea on exertion, edema, irregular heartbeat, orthopnea, palpitations, paroxysmal nocturnal dyspnea, rapid heart rate, shortness of breath, or syncope/near syncope or TIA/amaurosis fugax, claudication  Still has some baseline exertional dyspnea cough and congestion associated with smoking and allergies.  REVIEWED OF SYSTEMS   Review of Systems  Constitutional:  Positive for malaise/fatigue.  HENT:  Positive for congestion.   Respiratory:  Positive for cough, shortness of breath and wheezing.        She with allergies and asthma.  Neurological:  Negative for dizziness and focal weakness.  Psychiatric/Behavioral:  The patient is nervous/anxious and has insomnia.    I have reviewed and (if needed) personally updated the patient's problem list, medications, allergies, past medical and surgical history, social and family history.   PAST MEDICAL HISTORY  Past Medical History:  Diagnosis Date   Diverticulitis    GERD (gastroesophageal reflux disease)    History of bleeding ulcers    told not to take ibuprofen   History of bronchitis 48yrs ago   Migraine    Obesity    Sleep apnea    CPAP    PAST SURGICAL HISTORY   Past Surgical History:  Procedure Laterality Date    CESAREAN SECTION N/A 04/08/2015   Procedure: CESAREAN SECTION;  Surgeon: Catalina Antigua, MD;  Location: [facility];  Service: Obstetrics;  Laterality: N/A;   CESAREAN SECTION WITH BILATERAL TUBAL LIGATION  04/08/2015   CHOLECYSTECTOMY  03/27/2014   Dr Lemar Livings   COLONOSCOPY WITH PROPOFOL N/A 12/18/2021   Procedure: COLONOSCOPY WITH PROPOFOL;  Surgeon: Toney Reil, MD;  Location: [facility] END[facility]COPY;  Service: Gastroenterology;  Laterality: N/A;   ESOPHAGOGASTRODUODENOSCOPY N/A 05/19/2017   Procedure: ESOPHAGOGASTRODUODENOSCOPY (EGD);  Surgeon: Toney Reil, MD;  Location: [facility];  Service: Endoscopy;  Laterality: N/A;   ESOPHAGOGASTRODUODENOSCOPY (EGD) WITH PROPOFOL N/A 12/18/2021   Procedure: ESOPHAGOGASTRODUODENOSCOPY (EGD) WITH PROPOFOL;  Surgeon: Toney Reil, MD;  Location: [facility] ENDOSCOPY;  Service: Gastroenterology;  Laterality: N/A;   GRADUATED/TOLERANCE TEST  03/06/2022   Good exercise tolerance-10:24 min.  Reached peak heart rate of 169 bpm equals 91% MPHR at 186 bpm.  No EKG changes, chest pain or dyspnea.   TONSILLECTOMY  03/10/2012   Procedure: TONSILLECTOMY;  Surgeon: Suzanna Obey, MD;  Location: [facility] OR;  Service: ENT;  Laterality: Bilateral;    Immunization History  Administered Date(s) Administered   Influenza, Seasonal, Injecte, Preservative Fre 05/02/2013   Influenza,inj,Quad PF,6+ Mos 04/23/2015   Influenza-Unspecified 06/17/2014   Pneumococcal Polysaccharide-23 06/19/2013   Tdap 04/04/2013, 01/18/2015    MEDICATIONS/ALLERGIES   Current Meds  Medication Sig   acetaminophen (TYLENOL) 325 MG tablet Take 650 mg by mouth every 6 (six) hours as needed for moderate pain.   AIMOVIG 140 MG/ML SOAJ SMARTSIG:140 Milligram(s) SUB-Q Every 4 Weeks   cetirizine (ZYRTEC) 10 MG tablet Take 10 mg by mouth daily.   cholestyramine (QUESTRAN) 4 g packet TAKE 1 PACKET BY MOUTH 2 TIMES DAILY.   DENTA 5000 PLUS 1.1 % CREA dental cream Take by mouth at bedtime.    dicyclomine (BENTYL) 10 MG capsule Take 1 capsule (10 mg total) by mouth 4 (four) times daily -  before meals and at bedtime.   naproxen (NAPROSYN) 500 MG tablet Take 1 tablet by mouth 2 (two) times daily with a meal.   nicotine (NICODERM CQ - DOSED IN MG/24 HR) 7 mg/24hr patch Place 7 mg onto the skin daily.   omeprazole (PRILOSEC) 40 MG capsule TAKE 1 CAPSULE (40 MG TOTAL) BY MOUTH DAILY.   phentermine (ADIPEX-P) 37.5 MG tablet Take 37.5 mg by mouth daily.   predniSONE (STERAPRED UNI-PAK 21 TAB) 10 MG (21) TBPK tablet Take as needed (Follow direction on package)   PROAIR HFA 108 (90 Base) MCG/ACT inhaler Inhale into the lungs.   SPIRIVA RESPIMAT 1.25 MCG/ACT AERS SMARTSIG:2 Puff(s) By Mouth Daily   SYMBICORT 160-4.5 MCG/ACT inhaler SMARTSIG:2 Puff(s) By Mouth Twice Daily   topiramate (TOPAMAX) 25 MG capsule Take 25 mg by mouth daily.    Allergies  Allergen Reactions   Codeine Other (See Comments)    Pt. States she had a seizure   Ibuprofen     Patient was diagnosed with bleeding ulcers and advised not to take this medication.   Latex     SOCIAL HISTORY/FAMILY HISTORY  Reviewed in Epic:  Pertinent findings:  Social History   Tobacco Use   Smoking status: Every Day    Packs/day: 1.00    Years: 13.00    Total pack years: 13.00    Types: Cigarettes   Smokeless tobacco: Never  Vaping Use   Vaping Use: Never used  Substance Use Topics   Alcohol use: Yes    Comment: beer occasionally   Drug use: No   Social History   Social History Narrative   Not on file    OBJCTIVE -PE, EKG, labs   Wt Readings from Last 3 Encounters:  04/16/22 202 lb 6.4 oz (91.8 kg)  04/13/22 202 lb 6 oz (91.8 kg)  02/23/22 209 lb (94.8 kg)    Physical Exam: BP 128/62 (BP Location: Left Arm, Patient Position: Sitting, Cuff Size: Large)   Pulse 62   Ht 5\' 5"  (1.651 m)   Wt 202 lb 6.4 oz (91.8 kg)   SpO2 99%   BMI 33.68 kg/m  Physical Exam Constitutional:      General: She is not in acute  distress.    Appearance: Normal appearance. She is obese. She is not ill-appearing or toxic-appearing.  HENT:     Head: Normocephalic and atraumatic.  Cardiovascular:     Rate and Rhythm: Normal rate.     Pulses: Normal pulses.  Pulmonary:     Effort: Pulmonary effort is normal.  Chest:     Chest wall: Tenderness present.  Musculoskeletal:        General: No swelling. Normal range of motion.     Cervical back: Normal range of motion and neck supple.  Skin:    General: Skin is warm and dry.  Neurological:     General: No focal deficit present.     Mental Status: She is alert and oriented to person, place, and time.  Psychiatric:        Mood and Affect: Mood normal.        Behavior: Behavior normal.        Thought Content: Thought content normal.        Judgment: Judgment normal.    Adult ECG Report  Rate: 62 ;  Rhythm: normal sinus rhythm and axis, intervals and durations. ;   Narrative Interpretation: Normal reviewed  Recent Labs:  reviewed  No results found for: "CHOL", "HDL", "LDLCALC", "LDLDIRECT", "TRIG", "CHOLHDL" Lab Results  Component Value Date   CREATININE 0.54 02/05/2022   BUN 17 02/05/2022   NA 141 02/05/2022   K 3.3 (L) 02/05/2022   CL 112 (H) 02/05/2022   CO2 24 02/05/2022      Latest Ref Rng & Units 02/05/2022    9:53 PM 11/26/2021    3:35 PM 01/16/2021    2:52 PM  CBC  WBC 4.0 - 10.5 K/uL 5.8  5.8  6.2   Hemoglobin 12.0 - 15.0 g/dL 03/18/2021  00.1  74.9   Hematocrit 36.0 - 46.0 % 38.5  41.7  43.3   Platelets 150 - 400 K/uL 274  289  274     No results found for: "HGBA1C" Lab Results  Component Value Date   TSH 1.503 04/23/2015    ================================================== I spent a total of 12 minutes with the patient spent in direct patient consultation.  Additional time spent with chart review  / charting (studies, outside notes, etc): 12 min Total Time: 24 min  Current medicines are reviewed at length with the patient today.  (+/-  concerns) N/A  Notice: This dictation was prepared with Dragon dictation along with smart phrase technology. Any transcriptional errors that result from this process are unintentional and may not be corrected upon review.  Studies Ordered:  Orders Placed This Encounter  Procedures   EKG 12-Lead   Meds ordered this encounter  Medications   predniSONE (STERAPRED UNI-PAK 21 TAB) 10 MG (21) TBPK tablet    Sig: Take as needed (Follow direction on package)    Dispense:  1 each    Refill:  3    Patient Instructions / Medication Changes & Studies & Tests Ordered   Patient Instructions  Medication Instructions:  Take prednisone pack as needed as directed.  When you are on half pack, take Tylenol 500 mg (2 tablets) three times a day for 3 days,  2 tablet twice a day 2 days, then 250 mg (1 tablet) once a day for 1 day.    *If you need a refill on your cardiac medications before your next appointment, please call your pharmacy*   Lab Work: None ordered today   Testing/Procedures: None ordered today   Follow-Up: At [facility], you and your health needs are our priority.  As part of our continuing mission to provide you with exceptional heart care, we have created designated Provider Care Teams.  These Care Teams include your primary Cardiologist (physician) and Advanced Practice Providers (APPs -  Physician Assistants and Nurse Practitioners) who all work together to provide you with the care you need, when you need it.  We recommend signing up for the patient portal called "MyChart".  Sign up information is provided on this After Visit Summary.  MyChart is used to connect with patients for Virtual Visits (Telemedicine).  Patients are able to view lab/test results, encounter notes, upcoming appointments, etc.  Non-urgent messages can be sent to your provider as well.   To learn more about what you can do with MyChart, go to [email or web address].    Your next appointment:    6 month(s)  The format for your next appointment:   In Person  Provider:   Bryan Lemma, MD         Marykay Lex, MD, MS Bryan Lemma, M.D., M.S. Interventional Cardiologist  [facility]   [address]; [address] [place], [place]  [address] [phone]           Fax [phone]    Thank you for choosing [facility] in [place]!!

## 2022-04-18 ENCOUNTER — Encounter: Payer: Self-pay | Admitting: Cardiology

## 2022-04-18 NOTE — Assessment & Plan Note (Signed)
We did spend some time talk about smoking cessation.  This is often contributing to some of her dyspnea.  Not necessarily to the chest discomfort however.  She realizes she needs to quit, but has not yet worked on efforts to quit.

## 2022-04-18 NOTE — Assessment & Plan Note (Signed)
Symptoms somewhat resolved after using prednisone taper.  I do suspect this is probably most consistent with costochondritis.  Talked about using as needed Tylenol for mild symptoms, but for significant symptoms that were similar to her initial presenting symptoms, we talked about having a PRN prednisone Dosepak to use just in case.  She will actually combine PRN x-ray Tylenol with the prednisone when she gets to the end of the Dosepak and then continue that for several days downstream.  See patient instructions.

## 2022-04-29 ENCOUNTER — Ambulatory Visit: Payer: Medicaid Other | Admitting: Gastroenterology

## 2022-05-27 ENCOUNTER — Other Ambulatory Visit: Payer: Self-pay | Admitting: Gastroenterology

## 2022-08-21 ENCOUNTER — Other Ambulatory Visit: Payer: Self-pay | Admitting: Gastroenterology

## 2022-08-21 DIAGNOSIS — K9089 Other intestinal malabsorption: Secondary | ICD-10-CM

## 2022-10-16 ENCOUNTER — Ambulatory Visit
Admission: EM | Admit: 2022-10-16 | Discharge: 2022-10-16 | Disposition: A | Discharge status: Home / Self Care | Payer: Medicaid Other | Attending: Urgent Care | Admitting: Urgent Care

## 2022-10-16 DIAGNOSIS — R6889 Other general symptoms and signs: Secondary | ICD-10-CM

## 2022-10-16 MED ORDER — OSELTAMIVIR PHOSPHATE 75 MG PO CAPS: 75.0000 mg | ORAL_CAPSULE | Freq: Two times a day (BID) | ORAL | 0 refills | Status: DC

## 2022-10-16 NOTE — ED Triage Notes (Signed)
Headache, congestion, chills, body aches, fever 101.5 at home, nausea that started yesterday. [place] daughter was positive for the flu yesterday and pt states she has been around her recently. Taking tylenol and ibuprofen.

## 2022-10-16 NOTE — ED Provider Notes (Signed)
Robyn Ortiz    CSN: [id number] Arrival date & time: 10/16/22  0805      History   Chief Complaint Chief Complaint  Patient presents with   Headache   Generalized Body Aches    HPI Robyn Ortiz is a 37 y.o. female.    Headache   Presents to UC with c/o symptoms starting 2 days ago, including headache, cough, nausea, diarrhea, body aches, chills, fever (tmax 101.5).   She states granddaughter dx with flu and has been with her frequently.   Denies vomiting. Able to hold food and fluids down.  PMH included asthma.  Past Medical History:  Diagnosis Date   Diverticulitis    GERD (gastroesophageal reflux disease)    History of bleeding ulcers    told not to take ibuprofen   History of bronchitis 38yr ago   Migraine    Obesity    Sleep apnea    CPAP    Patient Active Problem List   Diagnosis Date Noted   Anterior chest wall pain 02/23/2022   Abdominal pain, epigastric    Gastric erosion    Rectal bleeding    History of Clostridium difficile infection    Postpartum depression 04/23/2015   Eczema 09/04/2014   Abnormal quad screen 03/03/2013   Obesity (BMI 30-39.9) 11/21/2012   Asthma 11/21/2012   Current smoker 11/21/2012   Condyloma acuminatum of perianal region 11/21/2012   Migraine 11/21/2012    Past Surgical History:  Procedure Laterality Date   CESAREAN SECTION N/A 04/08/2015   Procedure: CESAREAN SECTION;  Surgeon: PMora Bellman MD;  [facility];  Service: Obstetrics;  Laterality: N/A;   CESAREAN SECTION WITH BILATERAL TUBAL LIGATION  04/08/2015   CHOLECYSTECTOMY  03/27/2014   Dr BBary Castilla  COLONOSCOPY WITH PROPOFOL N/A 12/18/2021   Procedure: COLONOSCOPY WITH PROPOFOL;  Surgeon: VLin Landsman MD;  [facility];  Service: Gastroenterology;  Laterality: N/A;   ESOPHAGOGASTRODUODENOSCOPY N/A 05/19/2017   Procedure: ESOPHAGOGASTRODUODENOSCOPY (EGD);  Surgeon: VLin Landsman MD;  [place] [place]   Service: Endoscopy;  Laterality: N/A;   ESOPHAGOGASTRODUODENOSCOPY (EGD) WITH PROPOFOL N/A 12/18/2021   Procedure: ESOPHAGOGASTRODUODENOSCOPY (EGD) WITH PROPOFOL;  Surgeon: VLin Landsman MD;  [facility];  Service: Gastroenterology;  Laterality: N/A;   GRADUATED/TOLERANCE TEST  03/06/2022   Good exercise tolerance-10:24 min.  Reached peak heart rate of 169 bpm equals 91% MPHR at 186 bpm.  No EKG changes, chest pain or dyspnea.   TONSILLECTOMY  03/10/2012   Procedure: TONSILLECTOMY;  Surgeon: JMelissa Montane MD;  [facility];  Service: E[facility];  Laterality: Bilateral;    OB History     Gravida  3   Para  3   Term  3   Preterm      AB      Living  3      SAB      IAB      Ectopic      Multiple  0   Live Births  3        Obstetric Comments  1st Menstrual Cycle:  14  1st Pregnancy:  17          Home Medications    Prior to Admission medications   Medication Sig Start Date End Date Taking? Authorizing Provider  acetaminophen (TYLENOL) 325 MG tablet Take 650 mg by mouth every 6 (six) hours as needed for moderate pain.   Yes [provider]  AIMOVIG 140 MG/ML SOAJ SMARTSIG:140 Milligram(s)  SUB-Q Every 4 Weeks 01/15/22  Yes [provider]  cetirizine (ZYRTEC) 10 MG tablet Take 10 mg by mouth daily. 04/04/17  Yes [provider]  cholestyramine (QUESTRAN) 4 g packet TAKE 1 PACKET BY MOUTH 2 TIMES DAILY. 02/02/22  Yes Vanga, Tally Due, MD  DENTA 5000 PLUS 1.1 % CREA dental cream Take by mouth at bedtime. 10/09/21  Yes [provider]  phentermine (ADIPEX-P) 37.5 MG tablet Take 37.5 mg by mouth daily. 01/13/22  Yes [provider]  PROAIR HFA 108 (90 Base) MCG/ACT inhaler Inhale into the lungs. 05/16/21  Yes [provider]  SYMBICORT 160-4.5 MCG/ACT inhaler SMARTSIG:2 Puff(s) By Mouth Twice Daily 03/14/21  Yes [provider]  dicyclomine (BENTYL) 10 MG capsule Take 1 capsule (10 mg total) by  mouth 4 (four) times daily -  before meals and at bedtime. 10/06/17 04/16/22  Lin Landsman, MD  fluticasone (FLONASE) 50 MCG/ACT nasal spray Place 1 spray into both nostrils daily. Patient not taking: Reported on 04/16/2022 01/01/22   [provider]  montelukast (SINGULAIR) 10 MG tablet Take 10 mg by mouth daily. Patient not taking: Reported on 04/16/2022 09/23/21   [provider]  naproxen (NAPROSYN) 500 MG tablet Take 1 tablet by mouth 2 (two) times daily with a meal.    [provider]  nicotine (NICODERM CQ - DOSED IN MG/24 HR) 7 mg/24hr patch Place 7 mg onto the skin daily. 05/31/21   [provider]  omeprazole (PRILOSEC) 40 MG capsule TAKE 1 CAPSULE (40 MG TOTAL) BY MOUTH DAILY. 05/27/22 06/26/22  Lin Landsman, MD  predniSONE (STERAPRED UNI-PAK 21 TAB) 10 MG (21) TBPK tablet Take as needed (Follow direction on package) 04/16/22   Leonie Man, MD  SPIRIVA RESPIMAT 1.25 MCG/ACT AERS SMARTSIG:2 Puff(s) By Mouth Daily 05/08/21   [provider]  topiramate (TOPAMAX) 25 MG capsule Take 25 mg by mouth daily. 03/12/22   [provider]    Family History Family History  Problem Relation Age of Onset   Diabetes Father    Bipolar disorder Mother    Heart disease Paternal Grandmother    Cancer Paternal Grandfather        intestional cancer   Other Paternal Grandfather        deteriorating disc    Social History Social History   Tobacco Use   Smoking status: Every Day    Packs/day: 1.00    Years: 13.00    Total pack years: 13.00    Types: Cigarettes   Smokeless tobacco: Never  Vaping Use   Vaping Use: Never used  Substance Use Topics   Alcohol use: Yes    Comment: beer occasionally   Drug use: No     Allergies   Codeine, Ibuprofen, and Latex   Review of Systems Review of Systems  Neurological:  Positive for headaches.     Physical Exam Triage Vital Signs ED Triage Vitals  Enc Vitals Group     BP  10/16/22 0822 110/74     Pulse Rate 10/16/22 0822 68     Resp 10/16/22 0822 16     Temp 10/16/22 0822 98 F (36.7 C)     Temp Source 10/16/22 0822 Oral     SpO2 10/16/22 0822 97 %     Weight --      Height --      Head Circumference --      Peak Flow --      Pain Score  10/16/22 0821 8     Pain Loc --      Pain Edu? --      Excl. in [place]? --    No data found.  Updated Vital Signs BP 110/74 (BP Location: Left Arm)   Pulse 68   Temp 98 F (36.7 C) (Oral)   Resp 16   LMP 09/24/2022 (Approximate)   SpO2 97%   Visual Acuity Right Eye Distance:   Left Eye Distance:   Bilateral Distance:    Right Eye Near:   Left Eye Near:    Bilateral Near:     Physical Exam Vitals reviewed.  Constitutional:      Appearance: She is well-developed. She is ill-appearing.  HENT:     Mouth/Throat:     Pharynx: Oropharyngeal exudate and posterior oropharyngeal erythema present.  Cardiovascular:     Rate and Rhythm: Normal rate and regular rhythm.     Heart sounds: Normal heart sounds.  Pulmonary:     Effort: Pulmonary effort is normal.     Breath sounds: Normal breath sounds.  Abdominal:     General: Abdomen is flat.     Palpations: Abdomen is soft.     Tenderness: There is generalized abdominal tenderness.  Skin:    General: Skin is warm and dry.  Neurological:     Mental Status: She is alert and oriented to person, place, and time.  Psychiatric:        Mood and Affect: Mood normal.        Behavior: Behavior normal.      UC Treatments / Results  Labs (all labs ordered are listed, but only abnormal results are displayed) Labs Reviewed - No data to display  EKG   Radiology No results found.  Procedures Procedures (including critical care time)  Medications Ordered in UC Medications - No data to display  Initial Impression / Assessment and Plan / UC Course  I have reviewed the triage vital signs and the nursing notes.  Pertinent labs & imaging results that were  available during my care of the patient were reviewed by me and considered in my medical decision making (see chart for details).   Patient is afebrile here without recent antipyretics. Satting well on room air. Overall is ill appearing, well hydrated, without respiratory distress. Pulmonary exam is unremarkable.  Lungs CTAB without wheezing, rhonchi, rales.  Pharyngeal erythema is present but without peritonsillar exudates.  Patient's symptoms are consistent with an acute viral process.  Given her confirmed exposure to a influenza positive individual, will prescribe Tamiflu.  Also recommended use of OTC medication for symptom control, and pushing fluids.  Final Clinical Impressions(s) / UC Diagnoses   Final diagnoses:  None   Discharge Instructions   None    ED Prescriptions   None    PDMP not reviewed this encounter.   Rose Phi, [place] 10/16/22 [phone]

## 2022-10-16 NOTE — Discharge Instructions (Addendum)
You have been diagnosed with a viral upper respiratory infection based on your symptoms and exam. Viral illnesses cannot be treated with antibiotics - they are self limiting - and you should find your symptoms resolving within a few days. Get plenty of rest and non-caffeinated fluids. Watch for signs of dehydration including reduced urine output and dark colored urine.  I have prescribed Tamiflu, antiviral therapy for influenza A, based on a presumptive diagnosis of influenza.   We recommend you use over-the-counter medications for symptom control including acetaminophen (Tylenol), ibuprofen (Advil/Motrin) or naproxen (Aleve) for throat pain, fever, chills or body aches. You may combine use of acetaminophen and ibuprofen/naproxen if needed.  Some patients find an pain-relieving throat spray such as Chloraseptic to be effective.  Also recommend cold/cough medication containing a cough suppressant such as dextromethorphan, as needed.   Saline mist spray is helpful for removing excess mucus from your nose.  Room humidifiers are helpful to ease breathing at night. I recommend guaifenesin (Mucinex) with plenty of water throughout the day to help thin and loosen mucus secretions in your respiratory passages.   If appropriate based upon your other medical problems, you might also find relief of nasal/sinus congestion symptoms by using a nasal decongestant such as fluticasone (Flonase ) or pseudoephedrine (Sudafed sinus).  You will need to obtain Sudafed from behind the pharmacist counter.  Speak to the pharmacist to verify that you are not duplicating medications with other over-the-counter formulations that you may be using.

## 2022-10-21 ENCOUNTER — Telehealth: Payer: Self-pay | Admitting: Gastroenterology

## 2022-10-21 ENCOUNTER — Other Ambulatory Visit: Payer: Self-pay | Admitting: Gastroenterology

## 2022-10-21 DIAGNOSIS — K9089 Other intestinal malabsorption: Secondary | ICD-10-CM

## 2022-10-21 NOTE — Telephone Encounter (Signed)
Pt would like refill of omeprazole  will call back in June to schedule appointment

## 2022-10-22 ENCOUNTER — Ambulatory Visit: Payer: Medicaid Other | Admitting: Cardiology

## 2022-10-26 MED ORDER — OMEPRAZOLE 40 MG PO CPDR: 40.0000 mg | DELAYED_RELEASE_CAPSULE | Freq: Every day | ORAL | 1 refills | Status: DC

## 2022-10-26 NOTE — Telephone Encounter (Signed)
Sent medication to the pharmacy for a 60 day supply

## 2022-10-26 NOTE — Addendum Note (Signed)
Addended by: Ulyess Blossom L on: 10/26/2022 07:40 AM   Modules accepted: Orders

## 2022-11-02 ENCOUNTER — Ambulatory Visit
Admission: EM | Admit: 2022-11-02 | Discharge: 2022-11-02 | Disposition: A | Discharge status: Home / Self Care | Payer: Medicaid Other

## 2022-11-02 DIAGNOSIS — J0100 Acute maxillary sinusitis, unspecified: Secondary | ICD-10-CM | POA: Diagnosis not present

## 2022-11-02 DIAGNOSIS — J209 Acute bronchitis, unspecified: Secondary | ICD-10-CM | POA: Diagnosis not present

## 2022-11-02 DIAGNOSIS — J040 Acute laryngitis: Secondary | ICD-10-CM | POA: Diagnosis not present

## 2022-11-02 MED ORDER — PREDNISONE 10 MG PO TABS: 40.0000 mg | ORAL_TABLET | Freq: Every day | ORAL | 0 refills | Status: AC

## 2022-11-02 MED ORDER — AMOXICILLIN 875 MG PO TABS: 875.0000 mg | ORAL_TABLET | Freq: Two times a day (BID) | ORAL | 0 refills | Status: AC

## 2022-11-02 NOTE — Discharge Instructions (Addendum)
Take the amoxicillin and prednisone as directed.  Use your albuterol inhaler as directed.  Follow up with your primary care provider if your symptoms are not improving.

## 2022-11-02 NOTE — ED Triage Notes (Signed)
Patient to Urgent Care with complaints of hoarseness/ nasal congestion/ bilateral ear ache/ body aches/ cough/ and shortness of breath.   Reports symptoms started three weeks ago. Reports lingering symptoms from the flu.   Taking mucinex/ tylenol/ ibuprofen.

## 2022-11-02 NOTE — ED Provider Notes (Signed)
[name]    CSN: [id number] Arrival date & time: 11/02/22  0803      History   Chief Complaint Chief Complaint  Patient presents with   Cough    HPI Robyn Ortiz is a 37 y.o. female.  Patient presents with 3-week history of cough, shortness of breath, earache, nasal congestion, hoarse voice.  Treating with Mucinex DM, Tylenol, ibuprofen.  She denies fever, chest pain, vomiting, diarrhea, rash, or other symptoms.  Patient was seen at this urgent care on 10/16/2022; diagnosed with flulike symptoms; treated with Tamiflu.  Her symptoms improved while on Tamiflu but then returned.  Her medical history includes GERD, diverticulitis, GI bleeding, ulcer, migraines.  Currently everyday smoker.    The history is provided by the patient and medical records.    Past Medical History:  Diagnosis Date   Diverticulitis    GERD (gastroesophageal reflux disease)    History of bleeding ulcers    told not to take ibuprofen   History of bronchitis 57yrs ago   Migraine    Obesity    Sleep apnea    CPAP    Patient Active Problem List   Diagnosis Date Noted   Anterior chest wall pain 02/23/2022   Abdominal pain, epigastric    Gastric erosion    Rectal bleeding    History of Clostridium difficile infection    Postpartum depression 04/23/2015   Eczema 09/04/2014   Abnormal quad screen 03/03/2013   Obesity (BMI 30-39.9) 11/21/2012   Asthma 11/21/2012   Current smoker 11/21/2012   Condyloma acuminatum of perianal region 11/21/2012   Migraine 11/21/2012    Past Surgical History:  Procedure Laterality Date   CESAREAN SECTION N/A 04/08/2015   Procedure: CESAREAN SECTION;  Surgeon: Mora Bellman, MD;  Location: [facility];  Service: Obstetrics;  Laterality: N/A;   CESAREAN SECTION WITH BILATERAL TUBAL LIGATION  04/08/2015   CHOLECYSTECTOMY  03/27/2014   Dr Bary Castilla   COLONOSCOPY WITH PROPOFOL N/A 12/18/2021   Procedure: COLONOSCOPY WITH PROPOFOL;  Surgeon: Lin Landsman, MD;   Location: [facility] ENDOSCOPY;  Service: Gastroenterology;  Laterality: N/A;   ESOPHAGOGASTRODUODENOSCOPY N/A 05/19/2017   Procedure: ESOPHAGOGASTRODUODENOSCOPY (EGD);  Surgeon: Lin Landsman, MD;  Location: [place];  Service: Endoscopy;  Laterality: N/A;   ESOPHAGOGASTRODUODENOSCOPY (EGD) WITH PROPOFOL N/A 12/18/2021   Procedure: ESOPHAGOGASTRODUODENOSCOPY (EGD) WITH PROPOFOL;  Surgeon: Lin Landsman, MD;  Location: [facility] ENDOSCOPY;  Service: Gastroenterology;  Laterality: N/A;   GRADUATED/TOLERANCE TEST  03/06/2022   Good exercise tolerance-10:24 min.  Reached peak heart rate of 169 bpm equals 91% MPHR at 186 bpm.  No EKG changes, chest pain or dyspnea.   TONSILLECTOMY  03/10/2012   Procedure: TONSILLECTOMY;  Surgeon: Melissa Montane, MD;  Location: [facility] OR;  Service: ENT;  Laterality: Bilateral;    OB History     Gravida  3   Para  3   Term  3   Preterm      AB      Living  3      SAB      IAB      Ectopic      Multiple  0   Live Births  3        Obstetric Comments  1st Menstrual Cycle:  14  1st Pregnancy:  17          Home Medications    Prior to Admission medications   Medication Sig Start Date End Date Taking? Authorizing Provider  amoxicillin (  AMOXIL) 875 MG tablet Take 1 tablet (875 mg total) by mouth 2 (two) times daily for 10 days. 11/02/22 11/12/22 Yes Sharion Balloon, NP  celecoxib (CELEBREX) 200 MG capsule Take 200 mg by mouth daily. 10/20/22  Yes [provider]  predniSONE (DELTASONE) 10 MG tablet Take 4 tablets (40 mg total) by mouth daily for 3 days. 11/02/22 11/05/22 Yes Sharion Balloon, NP  acetaminophen (TYLENOL) 325 MG tablet Take 650 mg by mouth every 6 (six) hours as needed for moderate pain.    [provider]  AIMOVIG 140 MG/ML SOAJ SMARTSIG:140 Milligram(s) SUB-Q Every 4 Weeks 01/15/22   [provider]  cetirizine (ZYRTEC) 10 MG tablet Take 10 mg by mouth daily. 04/04/17   [provider]   cholestyramine (QUESTRAN) 4 g packet TAKE 1 PACKET BY MOUTH 2 TIMES DAILY. 10/26/22 11/25/22  Lin Landsman, MD  DENTA 5000 PLUS 1.1 % CREA dental cream Take by mouth at bedtime. 10/09/21   [provider]  dicyclomine (BENTYL) 10 MG capsule Take 1 capsule (10 mg total) by mouth 4 (four) times daily -  before meals and at bedtime. 10/06/17 04/16/22  Lin Landsman, MD  fluticasone (FLONASE) 50 MCG/ACT nasal spray Place 1 spray into both nostrils daily. Patient not taking: Reported on 04/16/2022 01/01/22   [provider]  montelukast (SINGULAIR) 10 MG tablet Take 10 mg by mouth daily. Patient not taking: Reported on 04/16/2022 09/23/21   [provider]  naproxen (NAPROSYN) 500 MG tablet Take 1 tablet by mouth 2 (two) times daily with a meal.    [provider]  nicotine (NICODERM CQ - DOSED IN MG/24 HR) 7 mg/24hr patch Place 7 mg onto the skin daily. 05/31/21   [provider]  omeprazole (PRILOSEC) 40 MG capsule Take 1 capsule (40 mg total) by mouth daily. 10/26/22   Lin Landsman, MD  oseltamivir (TAMIFLU) 75 MG capsule Take 1 capsule (75 mg total) by mouth every 12 (twelve) hours. 10/16/22   Immordino, Annie Main, FNP  phentermine (ADIPEX-P) 37.5 MG tablet Take 37.5 mg by mouth daily. Patient not taking: Reported on 11/02/2022 01/13/22   [provider]  predniSONE (STERAPRED UNI-PAK 21 TAB) 10 MG (21) TBPK tablet Take as needed (Follow direction on package) 04/16/22   Leonie Man, MD  PROAIR HFA 108 [phone] Base) MCG/ACT inhaler Inhale into the lungs. 05/16/21   [provider]  SPIRIVA RESPIMAT 1.25 MCG/ACT AERS SMARTSIG:2 Puff(s) By Mouth Daily 05/08/21   [provider]  SYMBICORT 160-4.5 MCG/ACT inhaler SMARTSIG:2 Puff(s) By Mouth Twice Daily 03/14/21   [provider]  topiramate (TOPAMAX) 25 MG capsule Take 25 mg by mouth daily. 03/12/22   [provider]    Family History Family History  Problem  Relation Age of Onset   Diabetes Father    Bipolar disorder Mother    Heart disease Paternal Grandmother    Cancer Paternal Grandfather        intestional cancer   Other Paternal Grandfather        deteriorating disc    Social History Social History   Tobacco Use   Smoking status: Every Day    Packs/day: 1.00    Years: 13.00    Additional pack years: 0.00    Total pack years: 13.00    Types: Cigarettes   Smokeless tobacco: Never  Vaping Use   Vaping Use: Never used  Substance Use Topics   Alcohol use: Yes  Comment: beer occasionally   Drug use: No     Allergies   Codeine, Ibuprofen, and Latex   Review of Systems Review of Systems  Constitutional:  Negative for chills and fever.  HENT:  Positive for congestion, ear pain, sore throat and voice change.   Respiratory:  Positive for cough and shortness of breath.   Cardiovascular:  Negative for chest pain and palpitations.  Gastrointestinal:  Negative for diarrhea and vomiting.  Skin:  Negative for color change and rash.  All other systems reviewed and are negative.    Physical Exam Triage Vital Signs ED Triage Vitals [11/02/22 0811]  Enc Vitals Group     BP      Pulse Rate 69     Resp 18     Temp 97.8 F (36.6 C)     Temp src      SpO2 98 %     Weight      Height      Head Circumference      Peak Flow      Pain Score      Pain Loc      Pain Edu?      Excl. in [place]?    No data found.  Updated Vital Signs BP 106/74   Pulse 69   Temp 97.8 F (36.6 C)   Resp 18   LMP 10/29/2022 (Approximate)   SpO2 98%   Visual Acuity Right Eye Distance:   Left Eye Distance:   Bilateral Distance:    Right Eye Near:   Left Eye Near:    Bilateral Near:     Physical Exam Vitals and nursing note reviewed.  Constitutional:      General: She is not in acute distress.    Appearance: Normal appearance. She is well-developed. She is not ill-appearing.  HENT:     Right Ear: Tympanic membrane normal.     Left  Ear: Tympanic membrane normal.     Nose: Congestion present.     Mouth/Throat:     Mouth: Mucous membranes are moist.     Pharynx: Oropharynx is clear.     Comments: Hoarse voice.  Cardiovascular:     Rate and Rhythm: Normal rate and regular rhythm.     Heart sounds: Normal heart sounds.  Pulmonary:     Effort: Pulmonary effort is normal. No respiratory distress.     Breath sounds: Normal breath sounds.     Comments: Frequent nonproductive cough.  Musculoskeletal:     Cervical back: Neck supple.  Skin:    General: Skin is warm and dry.  Neurological:     Mental Status: She is alert.  Psychiatric:        Mood and Affect: Mood normal.        Behavior: Behavior normal.      UC Treatments / Results  Labs (all labs ordered are listed, but only abnormal results are displayed) Labs Reviewed - No data to display  EKG   Radiology No results found.  Procedures Procedures (including critical care time)  Medications Ordered in UC Medications - No data to display  Initial Impression / Assessment and Plan / UC Course  I have reviewed the triage vital signs and the nursing notes.  Pertinent labs & imaging results that were available during my care of the patient were reviewed by me and considered in my medical decision making (see chart for details).    Acute sinusitis, acute bronchitis, laryngitis.  Afebrile, VSS.  Lungs  are clear and O2 sat 98% on room air.  Treating with amoxicillin and prednisone.  Patient reports she has an albuterol inhaler at home that she can use.  Instructed patient to follow up with her PCP if her symptoms are not improving.  She agrees to plan of care.    Final Clinical Impressions(s) / UC Diagnoses   Final diagnoses:  Acute non-recurrent maxillary sinusitis  Acute bronchitis, unspecified organism  Laryngitis     Discharge Instructions      Take the amoxicillin and prednisone as directed.  Use your albuterol inhaler as directed.  Follow up  with your primary care provider if your symptoms are not improving.        ED Prescriptions     Medication Sig Dispense Auth. Provider   amoxicillin (AMOXIL) 875 MG tablet Take 1 tablet (875 mg total) by mouth 2 (two) times daily for 10 days. 20 tablet Sharion Balloon, NP   predniSONE (DELTASONE) 10 MG tablet Take 4 tablets (40 mg total) by mouth daily for 3 days. 12 tablet Sharion Balloon, NP      PDMP not reviewed this encounter.   Sharion Balloon, NP 11/02/22 [phone]

## 2022-11-19 ENCOUNTER — Other Ambulatory Visit: Payer: Self-pay | Admitting: Gastroenterology

## 2022-11-19 DIAGNOSIS — K9089 Other intestinal malabsorption: Secondary | ICD-10-CM

## 2022-11-21 ENCOUNTER — Ambulatory Visit
Admission: EM | Admit: 2022-11-21 | Discharge: 2022-11-21 | Disposition: A | Discharge status: Home / Self Care | Payer: Medicaid Other | Attending: Emergency Medicine | Admitting: Emergency Medicine

## 2022-11-21 ENCOUNTER — Other Ambulatory Visit: Payer: Self-pay | Admitting: Gastroenterology

## 2022-11-21 DIAGNOSIS — Z20818 Contact with and (suspected) exposure to other bacterial communicable diseases: Secondary | ICD-10-CM | POA: Diagnosis not present

## 2022-11-21 DIAGNOSIS — J029 Acute pharyngitis, unspecified: Secondary | ICD-10-CM | POA: Diagnosis not present

## 2022-11-21 LAB — POCT RAPID STREP A (OFFICE): Rapid Strep A Screen: NEGATIVE

## 2022-11-21 NOTE — Discharge Instructions (Addendum)
Your rapid strep test is negative.  A throat culture is pending; we will call you if it is positive requiring treatment.    Take Tylenol as needed for fever or discomfort.     Follow-up with your primary care provider if your symptoms are not improving.

## 2022-11-21 NOTE — ED Triage Notes (Signed)
Patient to Urgent Care with complaints of sore throat that started two days ago. Pain on the left side of her neck. Sneezing/ body aches/ headaches. Denies any known fevers. Has had some chills/ hot flashes.   Reports her son was diagnosed with strep on Sunday.

## 2022-11-21 NOTE — ED Provider Notes (Signed)
[name]    CSN: [id number] Arrival date & time: 11/21/22  1308      History   Chief Complaint Chief Complaint  Patient presents with   Sore Throat    HPI Robyn Ortiz is a 37 y.o. female.  Patient presents with 2-day history of sore throat.  She also reports intermittent chills, sneezing, body aches, headache.  No OTC medications taken today.  She denies fever, rash, difficulty swallowing, cough, shortness of breath, or other symptoms.  Her son was diagnosed with strep last weekend.  The history is provided by the patient and medical records.    Past Medical History:  Diagnosis Date   Diverticulitis    GERD (gastroesophageal reflux disease)    History of bleeding ulcers    told not to take ibuprofen   History of bronchitis 78yrs ago   Migraine    Obesity    Sleep apnea    CPAP    Patient Active Problem List   Diagnosis Date Noted   Anterior chest wall pain 02/23/2022   Abdominal pain, epigastric    Gastric erosion    Rectal bleeding    History of Clostridium difficile infection    Postpartum depression 04/23/2015   Eczema 09/04/2014   Abnormal quad screen 03/03/2013   Obesity (BMI 30-39.9) 11/21/2012   Asthma 11/21/2012   Current smoker 11/21/2012   Condyloma acuminatum of perianal region 11/21/2012   Migraine 11/21/2012    Past Surgical History:  Procedure Laterality Date   CESAREAN SECTION N/A 04/08/2015   Procedure: CESAREAN SECTION;  Surgeon: Catalina Antigua, MD;  Location: [facility];  Service: Obstetrics;  Laterality: N/A;   CESAREAN SECTION WITH BILATERAL TUBAL LIGATION  04/08/2015   CHOLECYSTECTOMY  03/27/2014   Dr Lemar Livings   COLONOSCOPY WITH PROPOFOL N/A 12/18/2021   Procedure: COLONOSCOPY WITH PROPOFOL;  Surgeon: Toney Reil, MD;  Location: [facility] ENDOSCOPY;  Service: Gastroenterology;  Laterality: N/A;   ESOPHAGOGASTRODUODENOSCOPY N/A 05/19/2017   Procedure: ESOPHAGOGASTRODUODENOSCOPY (EGD);  Surgeon: Toney Reil, MD;   [facility]: [facility];  Service: Endoscopy;  Laterality: N/A;   ESOPHAGOGASTRODUODENOSCOPY (EGD) WITH PROPOFOL N/A 12/18/2021   Procedure: ESOPHAGOGASTRODUODENOSCOPY (EGD) WITH PROPOFOL;  Surgeon: Toney Reil, MD;  Location: [facility] ENDOSCOPY;  Service: Gastroenterology;  Laterality: N/A;   GRADUATED/TOLERANCE TEST  03/06/2022   Good exercise tolerance-10:24 min.  Reached peak heart rate of 169 bpm equals 91% MPHR at 186 bpm.  No EKG changes, chest pain or dyspnea.   TONSILLECTOMY  03/10/2012   Procedure: TONSILLECTOMY;  Surgeon: Suzanna Obey, MD;  Location: [facility] OR;  Service: ENT;  Laterality: Bilateral;    OB History     Gravida  3   Para  3   Term  3   Preterm      AB      Living  3      SAB      IAB      Ectopic      Multiple  0   Live Births  3        Obstetric Comments  1st Menstrual Cycle:  14  1st Pregnancy:  17          Home Medications    Prior to Admission medications   Medication Sig Start Date End Date Taking? Authorizing Provider  acetaminophen (TYLENOL) 325 MG tablet Take 650 mg by mouth every 6 (six) hours as needed for moderate pain.    [provider]  AIMOVIG 140 MG/ML SOAJ SMARTSIG:140  Milligram(s) SUB-Q Every 4 Weeks 01/15/22   [provider]  celecoxib (CELEBREX) 200 MG capsule Take 200 mg by mouth daily. 10/20/22   [provider]  cetirizine (ZYRTEC) 10 MG tablet Take 10 mg by mouth daily. 04/04/17   [provider]  cholestyramine (QUESTRAN) 4 g packet TAKE 1 PACKET BY MOUTH 2 TIMES DAILY. 10/26/22 11/25/22  Toney Reil, MD  DENTA 5000 PLUS 1.1 % CREA dental cream Take by mouth at bedtime. 10/09/21   [provider]  dicyclomine (BENTYL) 10 MG capsule Take 1 capsule (10 mg total) by mouth 4 (four) times daily -  before meals and at bedtime. 10/06/17 04/16/22  Toney Reil, MD  fluticasone (FLONASE) 50 MCG/ACT nasal spray Place 1 spray into both nostrils daily. Patient not  taking: Reported on 04/16/2022 01/01/22   [provider]  montelukast (SINGULAIR) 10 MG tablet Take 10 mg by mouth daily. Patient not taking: Reported on 04/16/2022 09/23/21   [provider]  naproxen (NAPROSYN) 500 MG tablet Take 1 tablet by mouth 2 (two) times daily with a meal.    [provider]  nicotine (NICODERM CQ - DOSED IN MG/24 HR) 7 mg/24hr patch Place 7 mg onto the skin daily. 05/31/21   [provider]  omeprazole (PRILOSEC) 40 MG capsule Take 1 capsule (40 mg total) by mouth daily. 10/26/22   Toney Reil, MD  oseltamivir (TAMIFLU) 75 MG capsule Take 1 capsule (75 mg total) by mouth every 12 (twelve) hours. 10/16/22   Immordino, Jeannett Senior, FNP  phentermine (ADIPEX-P) 37.5 MG tablet Take 37.5 mg by mouth daily. Patient not taking: Reported on 11/02/2022 01/13/22   [provider]  predniSONE (STERAPRED UNI-PAK 21 TAB) 10 MG (21) TBPK tablet Take as needed (Follow direction on package) 04/16/22   Marykay Lex, MD  PROAIR HFA 108 [phone] Base) MCG/ACT inhaler Inhale into the lungs. 05/16/21   [provider]  SPIRIVA RESPIMAT 1.25 MCG/ACT AERS SMARTSIG:2 Puff(s) By Mouth Daily 05/08/21   [provider]  SYMBICORT 160-4.5 MCG/ACT inhaler SMARTSIG:2 Puff(s) By Mouth Twice Daily 03/14/21   [provider]  topiramate (TOPAMAX) 25 MG capsule Take 25 mg by mouth daily. 03/12/22   [provider]    Family History Family History  Problem Relation Age of Onset   Diabetes Father    Bipolar disorder Mother    Heart disease Paternal Grandmother    Cancer Paternal Grandfather        intestional cancer   Other Paternal Grandfather        deteriorating disc    Social History Social History   Tobacco Use   Smoking status: Every Day    Packs/day: 1.00    Years: 13.00    Additional pack years: 0.00    Total pack years: 13.00    Types: Cigarettes   Smokeless tobacco: Never  Vaping Use   Vaping Use: Never used   Substance Use Topics   Alcohol use: Yes    Comment: beer occasionally   Drug use: No     Allergies   Codeine, Ibuprofen, and Latex   Review of Systems Review of Systems  Constitutional:  Positive for chills. Negative for fever.  HENT:  Positive for sneezing and sore throat. Negative for ear pain and trouble swallowing.   Respiratory:  Negative for cough and shortness of breath.   Cardiovascular:  Negative for chest pain and palpitations.  Skin:  Negative for rash.  Neurological:  Positive  for headaches.  All other systems reviewed and are negative.    Physical Exam Triage Vital Signs ED Triage Vitals  Enc Vitals Group     BP --      Pulse Rate 11/21/22 1002 70     Resp 11/21/22 1002 18     Temp 11/21/22 1002 97.9 F (36.6 C)     Temp src --      SpO2 11/21/22 1002 97 %     Weight --      Height --      Head Circumference --      Peak Flow --      Pain Score 11/21/22 1007 6     Pain Loc --      Pain Edu? --      Excl. in [place]? --    No data found.  Updated Vital Signs BP 92/68   Pulse 70   Temp 97.9 F (36.6 C)   Resp 18   LMP 11/20/2022 (Approximate)   SpO2 97%   Visual Acuity Right Eye Distance:   Left Eye Distance:   Bilateral Distance:    Right Eye Near:   Left Eye Near:    Bilateral Near:     Physical Exam Vitals and nursing note reviewed.  Constitutional:      General: She is not in acute distress.    Appearance: Normal appearance. She is well-developed. She is not ill-appearing.  HENT:     Right Ear: Tympanic membrane normal.     Left Ear: Tympanic membrane normal.     Nose: Nose normal.     Mouth/Throat:     Mouth: Mucous membranes are moist.     Pharynx: Oropharynx is clear.  Cardiovascular:     Rate and Rhythm: Normal rate and regular rhythm.     Heart sounds: Normal heart sounds.  Pulmonary:     Effort: Pulmonary effort is normal. No respiratory distress.     Breath sounds: Normal breath sounds.  Musculoskeletal:     Cervical  back: Neck supple.  Skin:    General: Skin is warm and dry.  Neurological:     Mental Status: She is alert.  Psychiatric:        Mood and Affect: Mood normal.        Behavior: Behavior normal.      UC Treatments / Results  Labs (all labs ordered are listed, but only abnormal results are displayed) Labs Reviewed  CULTURE, GROUP A STREP [facility]  POCT RAPID STREP A (OFFICE)    EKG   Radiology No results found.  Procedures Procedures (including critical care time)  Medications Ordered in UC Medications - No data to display  Initial Impression / Assessment and Plan / UC Course  I have reviewed the triage vital signs and the nursing notes.  Pertinent labs & imaging results that were available during my care of the patient were reviewed by me and considered in my medical decision making (see chart for details).   Sore throat, exposure to strep throat.  Rapid strep negative; culture pending.  Discussed symptomatic treatment including Tylenol or ibuprofen.  Instructed patient to follow up with her PCP if symptoms are not improving.  She agrees to plan of care.    Final Clinical Impressions(s) / UC Diagnoses   Final diagnoses:  Sore throat  Exposure to strep throat     Discharge Instructions      Your rapid strep test is negative.  A throat culture is  pending; we will call you if it is positive requiring treatment.    Take Tylenol as needed for fever or discomfort.     Follow-up with your primary care provider if your symptoms are not improving.         ED Prescriptions   None    PDMP not reviewed this encounter.   Mickie Bailate, Carry Weesner H, NP 11/21/22 1048

## 2022-11-24 LAB — CULTURE, GROUP A STREP (THRC)

## 2022-12-04 ENCOUNTER — Other Ambulatory Visit: Payer: Self-pay | Admitting: Gastroenterology

## 2022-12-26 ENCOUNTER — Other Ambulatory Visit: Payer: Self-pay | Admitting: Gastroenterology

## 2022-12-26 ENCOUNTER — Ambulatory Visit
Admission: EM | Admit: 2022-12-26 | Discharge: 2022-12-26 | Disposition: A | Discharge status: Home / Self Care | Payer: Medicaid Other | Attending: Urgent Care | Admitting: Urgent Care

## 2022-12-26 DIAGNOSIS — G43819 Other migraine, intractable, without status migrainosus: Secondary | ICD-10-CM

## 2022-12-26 DIAGNOSIS — R6889 Other general symptoms and signs: Secondary | ICD-10-CM | POA: Diagnosis not present

## 2022-12-26 DIAGNOSIS — K9089 Other intestinal malabsorption: Secondary | ICD-10-CM

## 2022-12-26 LAB — POCT RAPID STREP A (OFFICE): Rapid Strep A Screen: NEGATIVE

## 2022-12-26 MED ORDER — PROMETHAZINE HCL 25 MG PO TABS: 25.0000 mg | ORAL_TABLET | Freq: Four times a day (QID) | ORAL | 0 refills | Status: DC | PRN

## 2022-12-26 MED ORDER — DEXAMETHASONE SODIUM PHOSPHATE 10 MG/ML IJ SOLN
10.0000 mg | Freq: Once | INTRAMUSCULAR | Status: AC
  Administered 2022-12-26: 10 mg via INTRAMUSCULAR

## 2022-12-26 NOTE — Discharge Instructions (Addendum)
You were given an injection of a corticosteroid for your migraine.  I have also prescribed a medication called promethazine which will help with any symptoms of nausea as well as promoting sleep.  Your strep test was negative.  Your symptoms are likely caused by a viral illness which will be self-limiting and does not respond to treatment by an antibiotic.  I recommend you push fluids is much as possible and get plenty of rest.

## 2022-12-26 NOTE — ED Triage Notes (Signed)
Pt c/o sore throat, bodyaches, congestion, chills, and migraines.   Stated: yesterday   Home interventions: Mucinex DM

## 2022-12-26 NOTE — ED Provider Notes (Signed)
[name]    CSN: [id number] Arrival date & time: 12/26/22  8295      History   Chief Complaint Chief Complaint  Patient presents with   Generalized Body Aches   Sore Throat   Nasal Congestion   Chills   Migraine    HPI Robyn Ortiz is a 37 y.o. female.    Sore Throat  Migraine    Presents to urgent care with complaint of sore throat, body aches, congestion, chills, headache.  Symptoms starting yesterday.  She is treated for migraines and states migraine symptoms have been worse in past week and not responding to treatment (injections).  Past Medical History:  Diagnosis Date   Diverticulitis    GERD (gastroesophageal reflux disease)    History of bleeding ulcers    told not to take ibuprofen   History of bronchitis 40yrs ago   Migraine    Obesity    Sleep apnea    CPAP    Patient Active Problem List   Diagnosis Date Noted   Anterior chest wall pain 02/23/2022   Abdominal pain, epigastric    Gastric erosion    Rectal bleeding    History of Clostridium difficile infection    Postpartum depression 04/23/2015   Eczema 09/04/2014   Abnormal quad screen 03/03/2013   Obesity (BMI 30-39.9) 11/21/2012   Asthma 11/21/2012   Current smoker 11/21/2012   Condyloma acuminatum of perianal region 11/21/2012   Migraine 11/21/2012    Past Surgical History:  Procedure Laterality Date   CESAREAN SECTION N/A 04/08/2015   Procedure: CESAREAN SECTION;  Surgeon: Catalina Antigua, MD;  Location: [facility];  Service: Obstetrics;  Laterality: N/A;   CESAREAN SECTION WITH BILATERAL TUBAL LIGATION  04/08/2015   CHOLECYSTECTOMY  03/27/2014   Dr Lemar Livings   COLONOSCOPY WITH PROPOFOL N/A 12/18/2021   Procedure: COLONOSCOPY WITH PROPOFOL;  Surgeon: Toney Reil, MD;  Location: [facility] ENDOSCOPY;  Service: Gastroenterology;  Laterality: N/A;   ESOPHAGOGASTRODUODENOSCOPY N/A 05/19/2017   Procedure: ESOPHAGOGASTRODUODENOSCOPY (EGD);  Surgeon: Toney Reil, MD;   Location: [facility];  Service: Endoscopy;  Laterality: N/A;   ESOPHAGOGASTRODUODENOSCOPY (EGD) WITH PROPOFOL N/A 12/18/2021   Procedure: ESOPHAGOGASTRODUODENOSCOPY (EGD) WITH PROPOFOL;  Surgeon: Toney Reil, MD;  Location: [facility] ENDOSCOPY;  Service: Gastroenterology;  Laterality: N/A;   GRADUATED/TOLERANCE TEST  03/06/2022   Good exercise tolerance-10:24 min.  Reached peak heart rate of 169 bpm equals 91% MPHR at 186 bpm.  No EKG changes, chest pain or dyspnea.   TONSILLECTOMY  03/10/2012   Procedure: TONSILLECTOMY;  Surgeon: Suzanna Obey, MD;  Location: [facility] OR;  Service: ENT;  Laterality: Bilateral;    OB History     Gravida  3   Para  3   Term  3   Preterm      AB      Living  3      SAB      IAB      Ectopic      Multiple  0   Live Births  3        Obstetric Comments  1st Menstrual Cycle:  14  1st Pregnancy:  17          Home Medications    Prior to Admission medications   Medication Sig Start Date End Date Taking? Authorizing Provider  acetaminophen (TYLENOL) 325 MG tablet Take 650 mg by mouth every 6 (six) hours as needed for moderate pain.    [provider]  AIMOVIG  140 MG/ML SOAJ SMARTSIG:140 Milligram(s) SUB-Q Every 4 Weeks 01/15/22   [provider]  celecoxib (CELEBREX) 200 MG capsule Take 200 mg by mouth daily. 10/20/22   [provider]  cetirizine (ZYRTEC) 10 MG tablet Take 10 mg by mouth daily. 04/04/17   [provider]  cholestyramine (QUESTRAN) 4 g packet TAKE 1 PACKET BY MOUTH 2 TIMES DAILY. 10/26/22 11/25/22  Toney Reil, MD  DENTA 5000 PLUS 1.1 % CREA dental cream Take by mouth at bedtime. 10/09/21   [provider]  dicyclomine (BENTYL) 10 MG capsule Take 1 capsule (10 mg total) by mouth 4 (four) times daily -  before meals and at bedtime. 10/06/17 04/16/22  Toney Reil, MD  fluticasone (FLONASE) 50 MCG/ACT nasal spray Place 1 spray into both nostrils daily. Patient not  taking: Reported on 04/16/2022 01/01/22   [provider]  montelukast (SINGULAIR) 10 MG tablet Take 10 mg by mouth daily. Patient not taking: Reported on 04/16/2022 09/23/21   [provider]  naproxen (NAPROSYN) 500 MG tablet Take 1 tablet by mouth 2 (two) times daily with a meal.    [provider]  nicotine (NICODERM CQ - DOSED IN MG/24 HR) 7 mg/24hr patch Place 7 mg onto the skin daily. 05/31/21   [provider]  omeprazole (PRILOSEC) 40 MG capsule TAKE 1 CAPSULE (40 MG TOTAL) BY MOUTH DAILY. 12/04/22   Toney Reil, MD  oseltamivir (TAMIFLU) 75 MG capsule Take 1 capsule (75 mg total) by mouth every 12 (twelve) hours. 10/16/22   Nayeli Calvert, Jeannett Senior, FNP  phentermine (ADIPEX-P) 37.5 MG tablet Take 37.5 mg by mouth daily. Patient not taking: Reported on 11/02/2022 01/13/22   [provider]  predniSONE (STERAPRED UNI-PAK 21 TAB) 10 MG (21) TBPK tablet Take as needed (Follow direction on package) 04/16/22   Marykay Lex, MD  PROAIR HFA 108 [phone] Base) MCG/ACT inhaler Inhale into the lungs. 05/16/21   [provider]  SPIRIVA RESPIMAT 1.25 MCG/ACT AERS SMARTSIG:2 Puff(s) By Mouth Daily 05/08/21   [provider]  SYMBICORT 160-4.5 MCG/ACT inhaler SMARTSIG:2 Puff(s) By Mouth Twice Daily 03/14/21   [provider]  topiramate (TOPAMAX) 25 MG capsule Take 25 mg by mouth daily. 03/12/22   [provider]    Family History Family History  Problem Relation Age of Onset   Diabetes Father    Bipolar disorder Mother    Heart disease Paternal Grandmother    Cancer Paternal Grandfather        intestional cancer   Other Paternal Grandfather        deteriorating disc    Social History Social History   Tobacco Use   Smoking status: Every Day    Packs/day: 1.00    Years: 13.00    Additional pack years: 0.00    Total pack years: 13.00    Types: Cigarettes   Smokeless tobacco: Never  Vaping Use   Vaping Use: Never used   Substance Use Topics   Alcohol use: Yes    Comment: beer occasionally   Drug use: No     Allergies   Codeine, Ibuprofen, and Latex   Review of Systems Review of Systems   Physical Exam Triage Vital Signs ED Triage Vitals  Enc Vitals Group     BP 12/26/22 0934 105/70     Pulse Rate 12/26/22 0934 72     Resp 12/26/22 0934 16     Temp 12/26/22 0934 98.6 F (37 C)  Temp Source 12/26/22 0934 Oral     SpO2 12/26/22 0934 99 %     Weight --      Height --      Head Circumference --      Peak Flow --      Pain Score 12/26/22 0932 10     Pain Loc --      Pain Edu? --      Excl. in GC? --    No data found.  Updated Vital Signs BP 105/70 (BP Location: Left Arm)   Pulse 72   Temp 98.6 F (37 C) (Oral)   Resp 16   LMP 12/24/2022   SpO2 99%   Visual Acuity Right Eye Distance:   Left Eye Distance:   Bilateral Distance:    Right Eye Near:   Left Eye Near:    Bilateral Near:     Physical Exam Vitals reviewed.  Constitutional:      Appearance: She is well-developed. She is ill-appearing.  HENT:     Mouth/Throat:     Pharynx: Posterior oropharyngeal erythema present.     Tonsils: Tonsillar exudate present. 1+ on the right. 1+ on the left.  Cardiovascular:     Rate and Rhythm: Normal rate and regular rhythm.     Heart sounds: Normal heart sounds.  Pulmonary:     Effort: Pulmonary effort is normal.     Breath sounds: Normal breath sounds.  Skin:    General: Skin is warm and dry.  Neurological:     General: No focal deficit present.     Mental Status: She is alert and oriented to person, place, and time.  Psychiatric:        Mood and Affect: Mood normal.        Behavior: Behavior normal.      UC Treatments / Results  Labs (all labs ordered are listed, but only abnormal results are displayed) Labs Reviewed - No data to display  EKG   Radiology No results found.  Procedures Procedures (including critical care time)  Medications Ordered in  UC Medications - No data to display  Initial Impression / Assessment and Plan / UC Course  I have reviewed the triage vital signs and the nursing notes.  Pertinent labs & imaging results that were available during my care of the patient were reviewed by me and considered in my medical decision making (see chart for details).   Dawnell Wheatley is a 37 y.o. female presenting with flu like symptoms. Patient is afebrile without recent antipyretics, satting well on room air. Overall is ill appearing though non-toxic, well hydrated, without respiratory distress. Pulmonary exam is unremarkable.  Lungs CTAB without wheezing, rhonchi, rales. RRR.  Rapid strep is negative.  "Exudate" seen on exam possibly a tonsillar stone.  Symptoms are consistent with an acute viral process likely triggering headache syndrome.  Will give an injection of Decadron 10 mg IM in clinic and evaluate response   Supportive care is recommended with use of OTC medication for symptom control as needed.  Reviewed chart history.  Counseled patient on potential for adverse effects with medications prescribed/recommended today, ER and return-to-clinic precautions discussed, patient verbalized understanding and agreement with care plan.  Final Clinical Impressions(s) / UC Diagnoses   Final diagnoses:  None   Discharge Instructions   None    ED Prescriptions   None    PDMP not reviewed this encounter.   Charma Igo, FNP 12/26/22 1000

## 2022-12-28 ENCOUNTER — Telehealth: Payer: Self-pay

## 2022-12-28 NOTE — Telephone Encounter (Signed)
[facility] sen us a fax states patient is receiving omeprazole 40mg. States your patient has been on a proton pump inhibitor for extended duration. The log term use of this medication can increase risk for fractures, mineral, and vitamin malabsorption, pneumonia, and clostridium difficile infection   Recommendation Please evaluate your patient medication therapy to see if this medication is still needed. Consider managing occasional symptoms with over the counter antacids( Tums, Rolaids) or H2 receptor blockers (eg famotidine)  

## 2023-01-14 ENCOUNTER — Other Ambulatory Visit: Payer: Self-pay | Admitting: Gastroenterology

## 2023-01-14 DIAGNOSIS — K9089 Other intestinal malabsorption: Secondary | ICD-10-CM

## 2023-01-15 ENCOUNTER — Other Ambulatory Visit: Payer: Self-pay

## 2023-01-15 ENCOUNTER — Encounter: Payer: Self-pay | Admitting: Pharmacist

## 2023-01-15 MED ORDER — CHOLESTYRAMINE 4 G PO PACK
4.0000 g | PACK | Freq: Two times a day (BID) | ORAL | 0 refills | Status: DC
  Filled 2023-01-15: qty 60, 30d supply, fill #0

## 2023-01-20 ENCOUNTER — Other Ambulatory Visit: Payer: Self-pay

## 2023-01-31 ENCOUNTER — Ambulatory Visit
Admission: EM | Admit: 2023-01-31 | Discharge: 2023-01-31 | Disposition: A | Discharge status: Home / Self Care | Payer: Medicaid Other | Attending: Emergency Medicine | Admitting: Emergency Medicine

## 2023-01-31 DIAGNOSIS — H6692 Otitis media, unspecified, left ear: Secondary | ICD-10-CM | POA: Diagnosis not present

## 2023-01-31 MED ORDER — AZITHROMYCIN 250 MG PO TABS: 250.0000 mg | ORAL_TABLET | Freq: Every day | ORAL | 0 refills | Status: DC

## 2023-01-31 NOTE — Discharge Instructions (Addendum)
Take the Zithromax as directed.  Follow up with your primary care provider if your symptoms are not improving.   ° ° °

## 2023-01-31 NOTE — ED Provider Notes (Signed)
[name]    CSN: [id number] Arrival date & time: 01/31/23  1504      History   Chief Complaint Chief Complaint  Patient presents with   Otalgia    HPI Robyn Ortiz is a 37 y.o. female.  Patient presents with bilateral ear pain, worse on the left x 1 day.  She was swimming over the weekend.  No fever, chills, ear drainage, sore throat, cough, shortness of breath, or other symptoms.  Treating with Tylenol.  The history is provided by the patient and medical records.    Past Medical History:  Diagnosis Date   Diverticulitis    GERD (gastroesophageal reflux disease)    History of bleeding ulcers    told not to take ibuprofen   History of bronchitis 32yrs ago   Migraine    Obesity    Sleep apnea    CPAP    Patient Active Problem List   Diagnosis Date Noted   Anterior chest wall pain 02/23/2022   Abdominal pain, epigastric    Gastric erosion    Rectal bleeding    History of Clostridium difficile infection    Postpartum depression 04/23/2015   Eczema 09/04/2014   Abnormal quad screen 03/03/2013   Obesity (BMI 30-39.9) 11/21/2012   Asthma 11/21/2012   Current smoker 11/21/2012   Condyloma acuminatum of perianal region 11/21/2012   Migraine 11/21/2012    Past Surgical History:  Procedure Laterality Date   CESAREAN SECTION N/A 04/08/2015   Procedure: CESAREAN SECTION;  Surgeon: Catalina Antigua, MD;  Location: [facility];  Service: Obstetrics;  Laterality: N/A;   CESAREAN SECTION WITH BILATERAL TUBAL LIGATION  04/08/2015   CHOLECYSTECTOMY  03/27/2014   Dr Lemar Livings   COLONOSCOPY WITH PROPOFOL N/A 12/18/2021   Procedure: COLONOSCOPY WITH PROPOFOL;  Surgeon: Toney Reil, MD;  Location: [facility] ENDOSCOPY;  Service: Gastroenterology;  Laterality: N/A;   ESOPHAGOGASTRODUODENOSCOPY N/A 05/19/2017   Procedure: ESOPHAGOGASTRODUODENOSCOPY (EGD);  Surgeon: Toney Reil, MD;  Location: [facility];  Service: Endoscopy;  Laterality: N/A;    ESOPHAGOGASTRODUODENOSCOPY (EGD) WITH PROPOFOL N/A 12/18/2021   Procedure: ESOPHAGOGASTRODUODENOSCOPY (EGD) WITH PROPOFOL;  Surgeon: Toney Reil, MD;  Location: [facility] ENDOSCOPY;  Service: Gastroenterology;  Laterality: N/A;   GRADUATED/TOLERANCE TEST  03/06/2022   Good exercise tolerance-10:24 min.  Reached peak heart rate of 169 bpm equals 91% MPHR at 186 bpm.  No EKG changes, chest pain or dyspnea.   TONSILLECTOMY  03/10/2012   Procedure: TONSILLECTOMY;  Surgeon: Suzanna Obey, MD;  Location: [facility] OR;  Service: ENT;  Laterality: Bilateral;    OB History     Gravida  3   Para  3   Term  3   Preterm      AB      Living  3      SAB      IAB      Ectopic      Multiple  0   Live Births  3        Obstetric Comments  1st Menstrual Cycle:  14  1st Pregnancy:  17          Home Medications    Prior to Admission medications   Medication Sig Start Date End Date Taking? Authorizing Provider  azithromycin (ZITHROMAX) 250 MG tablet Take 1 tablet (250 mg total) by mouth daily. Take first 2 tablets together, then 1 every day until finished. 01/31/23  Yes Mickie Bail, NP  acetaminophen (TYLENOL) 325 MG tablet Take 650  mg by mouth every 6 (six) hours as needed for moderate pain.    [provider]  AIMOVIG 140 MG/ML SOAJ SMARTSIG:140 Milligram(s) SUB-Q Every 4 Weeks 01/15/22   [provider]  celecoxib (CELEBREX) 200 MG capsule Take 200 mg by mouth daily. 10/20/22   [provider]  cetirizine (ZYRTEC) 10 MG tablet Take 10 mg by mouth daily. 04/04/17   [provider]  cholestyramine (QUESTRAN) 4 g packet Take 1 packet (4 g total) by mouth 2 (two) times daily. 01/15/23 02/14/23  Toney Reil, MD  DENTA 5000 PLUS 1.1 % CREA dental cream Take by mouth at bedtime. 10/09/21   [provider]  dicyclomine (BENTYL) 10 MG capsule Take 1 capsule (10 mg total) by mouth 4 (four) times daily -  before meals and at bedtime. 10/06/17 04/16/22   Toney Reil, MD  fluticasone (FLONASE) 50 MCG/ACT nasal spray Place 1 spray into both nostrils daily. Patient not taking: Reported on 04/16/2022 01/01/22   [provider]  montelukast (SINGULAIR) 10 MG tablet Take 10 mg by mouth daily. Patient not taking: Reported on 04/16/2022 09/23/21   [provider]  naproxen (NAPROSYN) 500 MG tablet Take 1 tablet by mouth 2 (two) times daily with a meal.    [provider]  nicotine (NICODERM CQ - DOSED IN MG/24 HR) 7 mg/24hr patch Place 7 mg onto the skin daily. 05/31/21   [provider]  omeprazole (PRILOSEC) 40 MG capsule TAKE 1 CAPSULE (40 MG TOTAL) BY MOUTH DAILY. 12/04/22   Toney Reil, MD  oseltamivir (TAMIFLU) 75 MG capsule Take 1 capsule (75 mg total) by mouth every 12 (twelve) hours. 10/16/22   Immordino, Jeannett Senior, FNP  phentermine (ADIPEX-P) 37.5 MG tablet Take 37.5 mg by mouth daily. Patient not taking: Reported on 11/02/2022 01/13/22   [provider]  predniSONE (STERAPRED UNI-PAK 21 TAB) 10 MG (21) TBPK tablet Take as needed (Follow direction on package) 04/16/22   Marykay Lex, MD  PROAIR HFA 108 [phone] Base) MCG/ACT inhaler Inhale into the lungs. 05/16/21   [provider]  promethazine (PHENERGAN) 25 MG tablet Take 1 tablet (25 mg total) by mouth every 6 (six) hours as needed for nausea or vomiting. 12/26/22   Immordino, Jeannett Senior, FNP  SPIRIVA RESPIMAT 1.25 MCG/ACT AERS SMARTSIG:2 Puff(s) By Mouth Daily 05/08/21   [provider]  SYMBICORT 160-4.5 MCG/ACT inhaler SMARTSIG:2 Puff(s) By Mouth Twice Daily 03/14/21   [provider]  topiramate (TOPAMAX) 25 MG capsule Take 25 mg by mouth daily. 03/12/22   [provider]    Family History Family History  Problem Relation Age of Onset   Diabetes Father    Bipolar disorder Mother    Heart disease Paternal Grandmother    Cancer Paternal Grandfather        intestional cancer   Other Paternal Grandfather         deteriorating disc    Social History Social History   Tobacco Use   Smoking status: Every Day    Packs/day: 1.00    Years: 13.00    Additional pack years: 0.00    Total pack years: 13.00    Types: Cigarettes   Smokeless tobacco: Never  Vaping Use   Vaping Use: Never used  Substance Use Topics   Alcohol use: Yes    Comment: beer occasionally   Drug use: No     Allergies   Codeine, Ibuprofen, and Latex   Review of Systems Review  of Systems  Constitutional:  Negative for chills and fever.  HENT:  Positive for ear pain. Negative for ear discharge and sore throat.   Respiratory:  Negative for cough and shortness of breath.   Cardiovascular:  Negative for chest pain and palpitations.     Physical Exam Triage Vital Signs ED Triage Vitals  Enc Vitals Group     BP 01/31/23 1532 115/70     Pulse Rate 01/31/23 1522 73     Resp 01/31/23 1522 18     Temp 01/31/23 1522 98.2 F (36.8 C)     Temp src --      SpO2 01/31/23 1522 98 %     Weight --      Height --      Head Circumference --      Peak Flow --      Pain Score 01/31/23 1531 8     Pain Loc --      Pain Edu? --      Excl. in GC? --    No data found.  Updated Vital Signs BP 115/70   Pulse 73   Temp 98.2 F (36.8 C)   Resp 18   LMP 01/02/2023   SpO2 98%   Visual Acuity Right Eye Distance:   Left Eye Distance:   Bilateral Distance:    Right Eye Near:   Left Eye Near:    Bilateral Near:     Physical Exam Vitals and nursing note reviewed.  Constitutional:      General: She is not in acute distress.    Appearance: She is well-developed.  HENT:     Right Ear: Tympanic membrane and ear canal normal.     Left Ear: Ear canal normal. Tympanic membrane is erythematous.     Nose: Nose normal.     Mouth/Throat:     Mouth: Mucous membranes are moist.     Pharynx: Oropharynx is clear.  Eyes:     Conjunctiva/sclera: Conjunctivae normal.  Cardiovascular:     Rate and Rhythm: Normal rate and regular  rhythm.     Heart sounds: Normal heart sounds.  Pulmonary:     Effort: Pulmonary effort is normal. No respiratory distress.     Breath sounds: Normal breath sounds.  Musculoskeletal:     Cervical back: Neck supple.  Skin:    General: Skin is warm and dry.  Neurological:     Mental Status: She is alert.  Psychiatric:        Mood and Affect: Mood normal.        Behavior: Behavior normal.      UC Treatments / Results  Labs (all labs ordered are listed, but only abnormal results are displayed) Labs Reviewed - No data to display  EKG   Radiology No results found.  Procedures Procedures (including critical care time)  Medications Ordered in UC Medications - No data to display  Initial Impression / Assessment and Plan / UC Course  I have reviewed the triage vital signs and the nursing notes.  Pertinent labs & imaging results that were available during my care of the patient were reviewed by me and considered in my medical decision making (see chart for details).    Left otitis media.  Treating with Zithromax as patient ports this is worked well for her in the past and she was on amoxicillin in March.  Tylenol as needed for discomfort.  Instructed her to follow-up with her PCP if her symptoms are not improving.  She agrees to plan of care.  Final Clinical Impressions(s) / UC Diagnoses   Final diagnoses:  Left otitis media, unspecified otitis media type     Discharge Instructions      Take the Zithromax as directed.  Follow up with your primary care provider if your symptoms are not improving.        ED Prescriptions     Medication Sig Dispense Auth. Provider   azithromycin (ZITHROMAX) 250 MG tablet Take 1 tablet (250 mg total) by mouth daily. Take first 2 tablets together, then 1 every day until finished. 6 tablet Mickie Bail, NP      PDMP not reviewed this encounter.   Mickie Bail, NP 01/31/23 1550

## 2023-01-31 NOTE — ED Triage Notes (Signed)
Patient to Urgent Care with complaints of sided left ear pain that started yesterday. Right ear has also started to have symptoms. Swimming over the weekend.   Denies any known fevers. No drainage.

## 2023-02-07 IMAGING — CR DG CHEST 2V
1 series · 2 of 2 positions shown · non-contrast
Comparison: 08/26/2012

CLINICAL DATA: Chest pain for 1 day

EXAM:
CHEST - 2 VIEW

[Series 1: dg chest 2 view · 0.14mm/px · 2 of 2 slices shown]
[im 1/2]
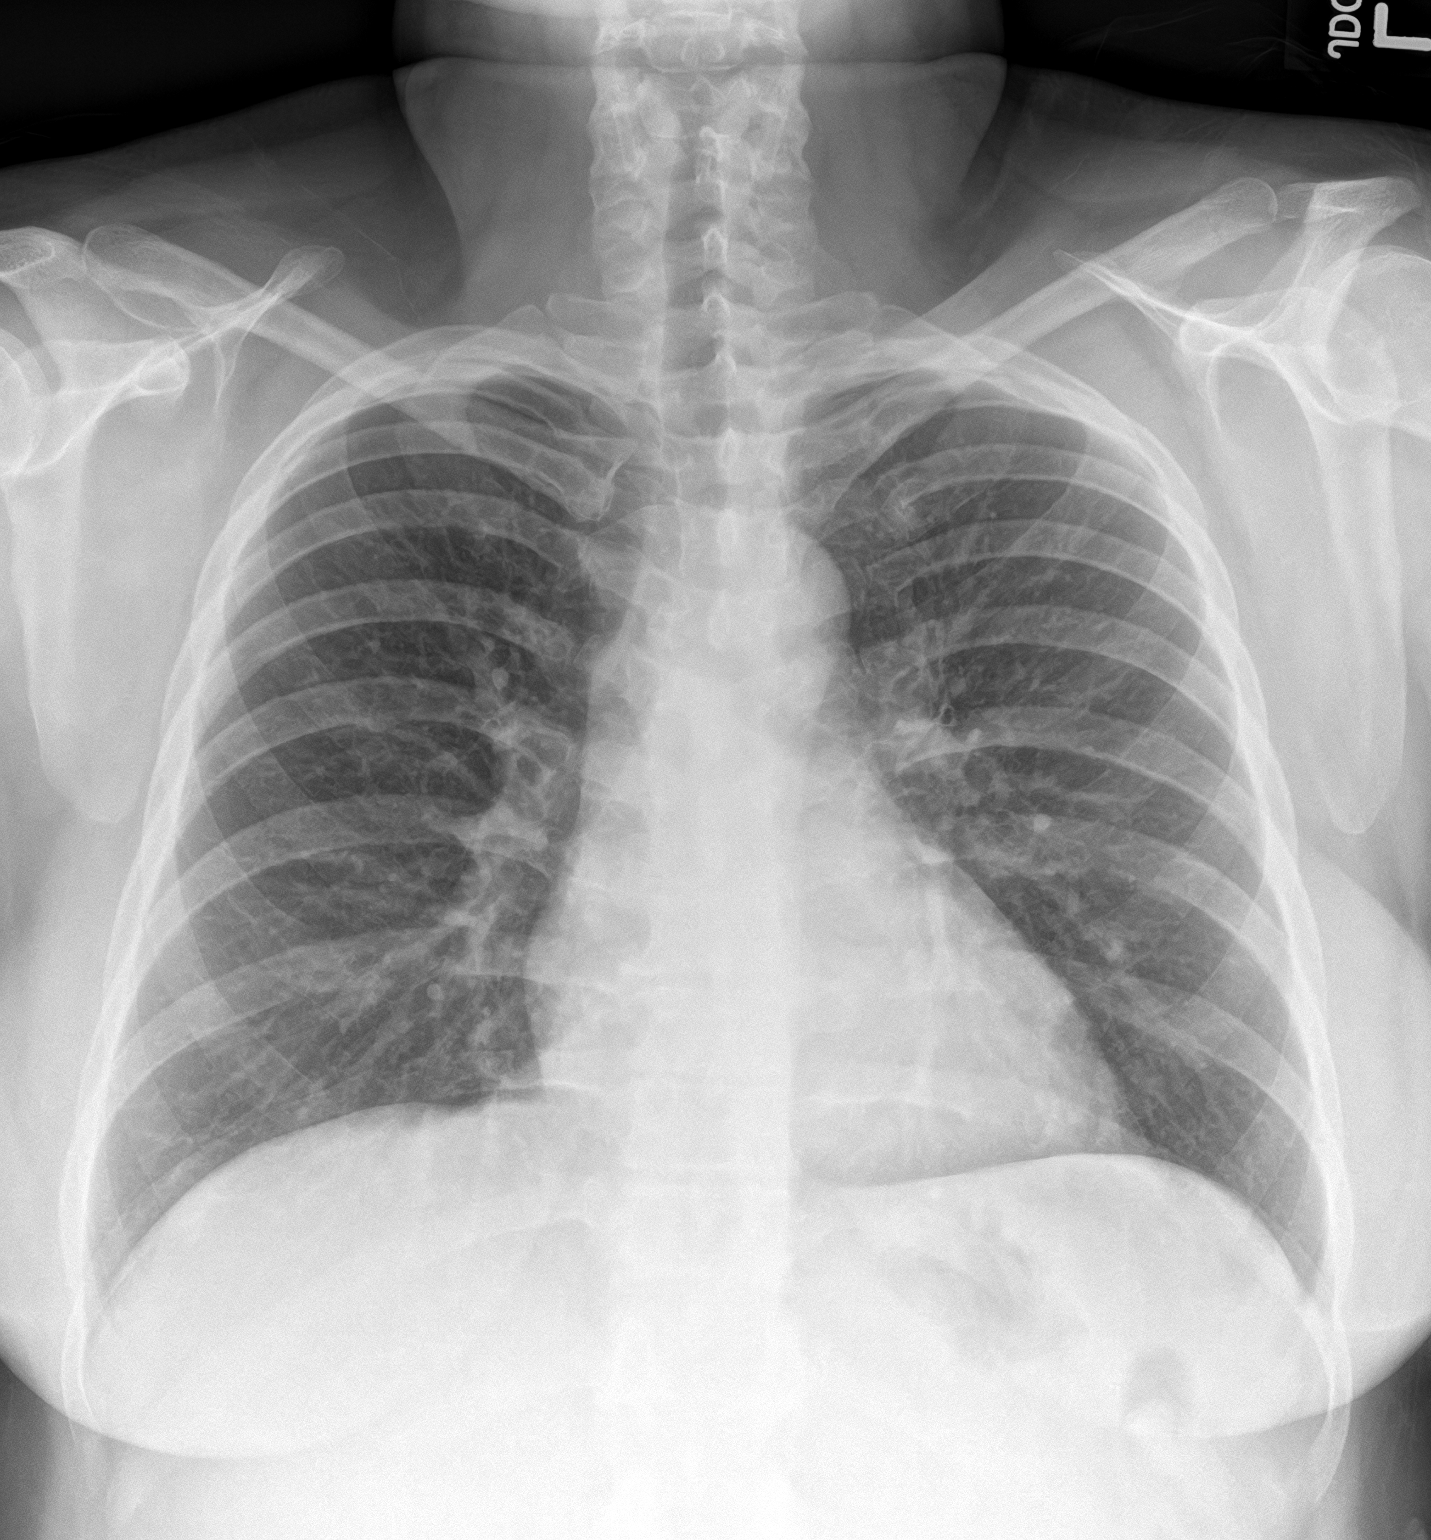
[im 2/2]
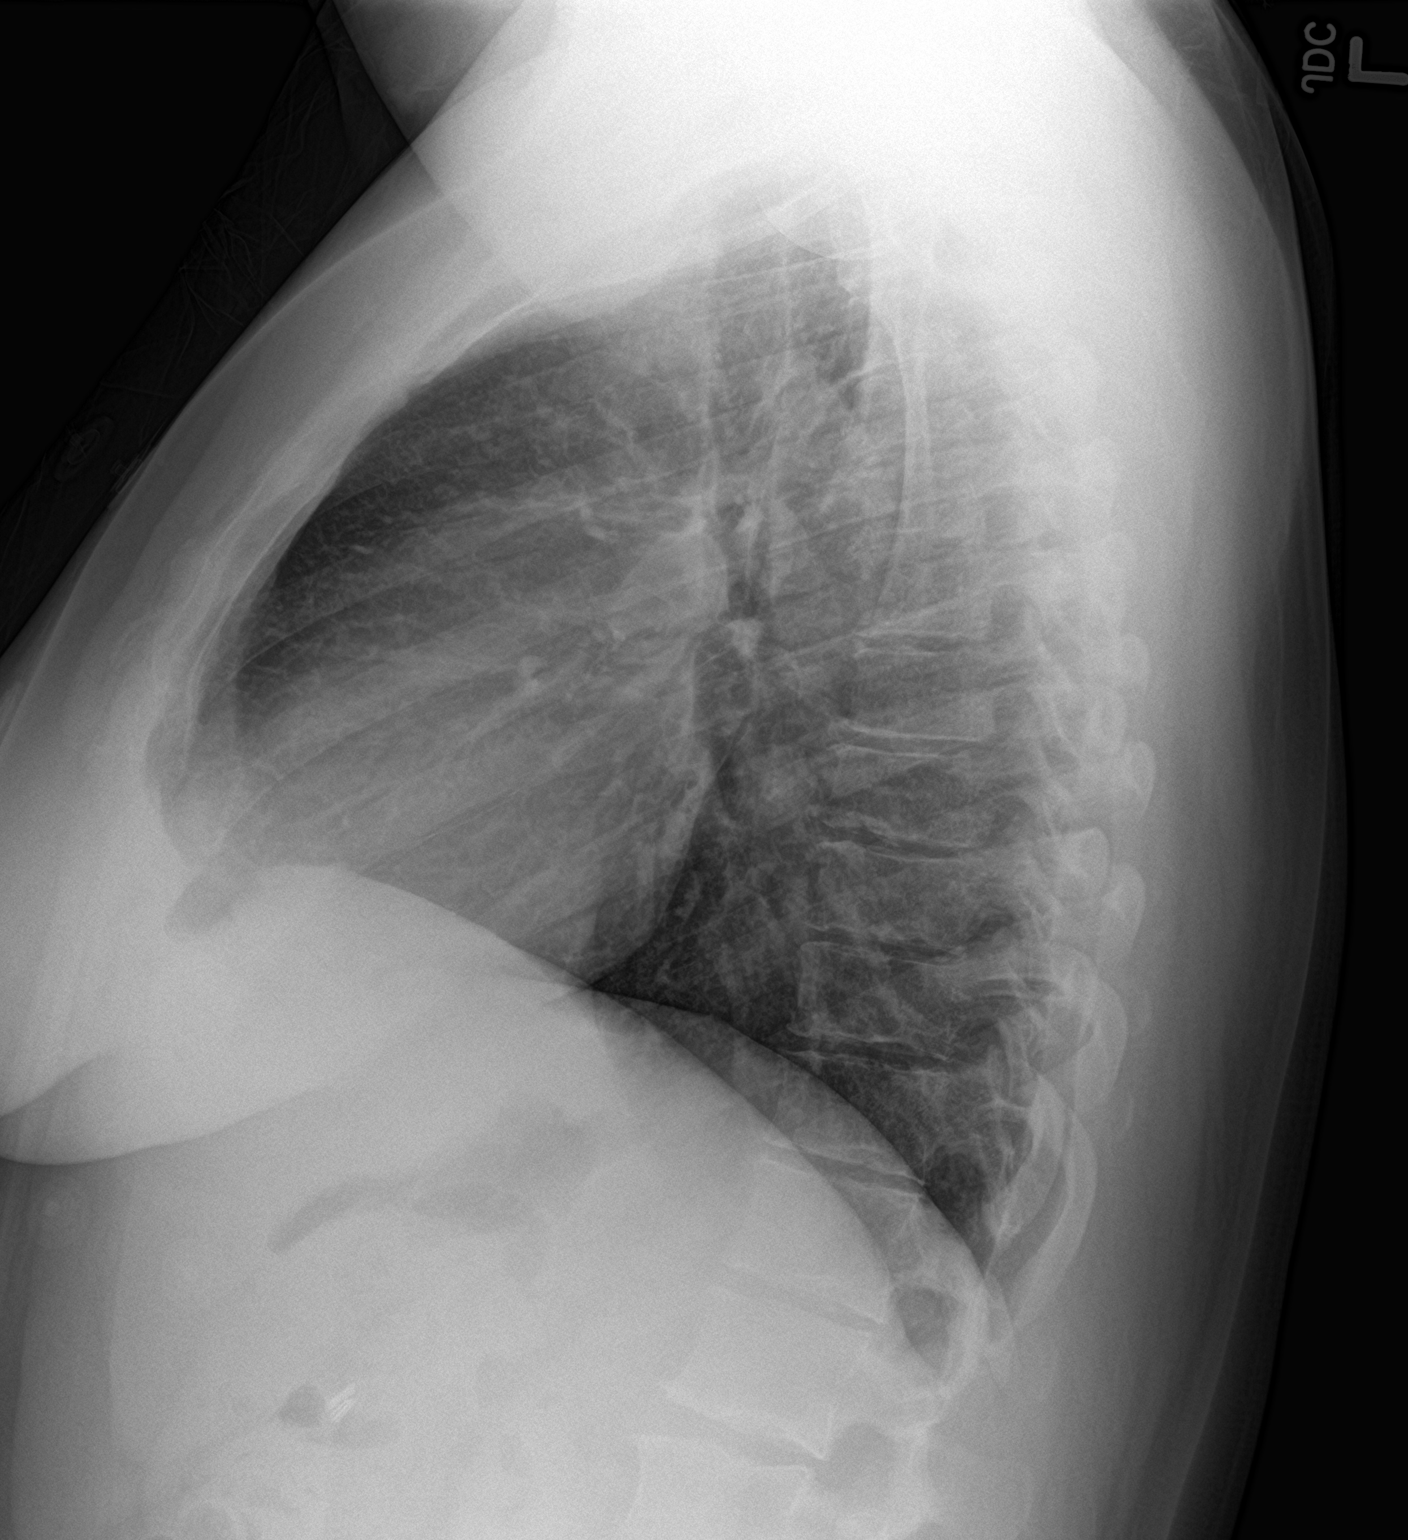

[2 of 2 positions shown; findings below may reference images not displayed]

FINDINGS: The heart size and mediastinal contours are within normal limits.
Both lungs are clear. The visualized skeletal structures are
unremarkable.
IMPRESSION: No active cardiopulmonary disease.

## 2023-02-09 ENCOUNTER — Other Ambulatory Visit: Payer: Self-pay | Admitting: Gastroenterology

## 2023-02-09 DIAGNOSIS — K9089 Other intestinal malabsorption: Secondary | ICD-10-CM

## 2023-02-11 ENCOUNTER — Ambulatory Visit (INDEPENDENT_AMBULATORY_CARE_PROVIDER_SITE_OTHER): Payer: Medicaid Other | Admitting: Family

## 2023-02-11 ENCOUNTER — Encounter: Payer: Self-pay | Admitting: Family

## 2023-02-11 VITALS — BP 108/64 | HR 64 | Temp 97.7°F | Resp 16 | Ht 65.0 in | Wt 177.0 lb

## 2023-02-11 DIAGNOSIS — G43101 Migraine with aura, not intractable, with status migrainosus: Secondary | ICD-10-CM | POA: Diagnosis not present

## 2023-02-11 DIAGNOSIS — E876 Hypokalemia: Secondary | ICD-10-CM

## 2023-02-11 DIAGNOSIS — K257 Chronic gastric ulcer without hemorrhage or perforation: Secondary | ICD-10-CM

## 2023-02-11 DIAGNOSIS — K146 Glossodynia: Secondary | ICD-10-CM

## 2023-02-11 DIAGNOSIS — F411 Generalized anxiety disorder: Secondary | ICD-10-CM | POA: Insufficient documentation

## 2023-02-11 DIAGNOSIS — K580 Irritable bowel syndrome with diarrhea: Secondary | ICD-10-CM | POA: Diagnosis not present

## 2023-02-11 DIAGNOSIS — Z720 Tobacco use: Secondary | ICD-10-CM | POA: Insufficient documentation

## 2023-02-11 DIAGNOSIS — A630 Anogenital (venereal) warts: Secondary | ICD-10-CM

## 2023-02-11 DIAGNOSIS — Z13220 Encounter for screening for lipoid disorders: Secondary | ICD-10-CM

## 2023-02-11 DIAGNOSIS — J4520 Mild intermittent asthma, uncomplicated: Secondary | ICD-10-CM

## 2023-02-11 LAB — LIPID PANEL
Cholesterol: 127 mg/dL (ref 0–200)
HDL: 48.4 mg/dL (ref >39.00)
LDL Cholesterol: 66 mg/dL (ref 0–99)
NonHDL: 79
Total CHOL/HDL Ratio: 3
Triglycerides: 65 mg/dL (ref 0.0–149.0)
VLDL: 13 mg/dL (ref 0.0–40.0)

## 2023-02-11 LAB — TSH: TSH: 1.28 u[IU]/mL (ref 0.35–5.50)

## 2023-02-11 LAB — BASIC METABOLIC PANEL
BUN: 19 mg/dL (ref 6–23)
CO2: 24 mEq/L (ref 19–32)
Calcium: 9 mg/dL (ref 8.4–10.5)
Chloride: 108 mEq/L (ref 96–112)
Creatinine, Ser: 0.7 mg/dL (ref 0.40–1.20)
GFR: 111.63 mL/min (ref >60.00)
Glucose, Bld: 84 mg/dL (ref 70–99)
Potassium: 4.1 mEq/L (ref 3.5–5.1)
Sodium: 139 mEq/L (ref 135–145)

## 2023-02-11 LAB — SEDIMENTATION RATE: Sed Rate: 3 mm/hr (ref 0–20)

## 2023-02-11 LAB — VITAMIN B12: Vitamin B-12: 315 pg/mL (ref 211–911)

## 2023-02-11 MED ORDER — PROAIR HFA 108 (90 BASE) MCG/ACT IN AERS: 2.0000 | INHALATION_SPRAY | Freq: Four times a day (QID) | RESPIRATORY_TRACT | Status: AC | PRN

## 2023-02-11 MED ORDER — RIZATRIPTAN BENZOATE 5 MG PO TABS
5.0000 mg | ORAL_TABLET | ORAL | 0 refills | Status: DC | PRN
Start: 2023-02-11 — End: 2023-03-26

## 2023-02-11 MED ORDER — ESCITALOPRAM OXALATE 10 MG PO TABS
10.0000 mg | ORAL_TABLET | Freq: Every day | ORAL | 0 refills | Status: DC
Start: 2023-02-11 — End: 2023-02-11

## 2023-02-11 MED ORDER — BUPROPION HCL ER (XL) 150 MG PO TB24
150.0000 mg | ORAL_TABLET | Freq: Every day | ORAL | 0 refills | Status: DC
Start: 2023-02-11 — End: 2023-04-20

## 2023-02-11 MED ORDER — AIMOVIG 140 MG/ML ~~LOC~~ SOAJ: SUBCUTANEOUS | 5 refills | Status: DC

## 2023-02-11 NOTE — Assessment & Plan Note (Signed)
With migraine onset Ordering b12 pending results

## 2023-02-11 NOTE — Progress Notes (Signed)
New Patient Office Visit  Subjective:  Patient ID: Robyn Ortiz, female    DOB: 03/17/1986  Age: 37 y.o. MRN: [id number]  CC:  Chief Complaint  Patient presents with   Establish Care   Labs Only    HPI Robyn Ortiz is here to establish care as a new patient.  Oriented to practice routines and expectations.  Prior provider was: Franco Nones in [place]   Pt is with acute concerns.  Headaches throughout this last week.  Was on aimovig which was helping but then ran out of RX due to transition between doctor appts. Last injection was 2-3 months ago. This was helping to control her migraines to a point, would still get migraines but would not last as long. This last and current episode started six days ago, dull achy and on the left temporal area and moving to right temporal area. Does get light sensitivity with these. Will get nauseous and or throw up, uses phenergan as needed. No loss of vision. Does get numbness on tip of her tongue and fingertips prior to onset. Will also get light aura prior to onset calls them 'fireflies' . Migraines have been   Frequency: a few times a week, usually at least twice a week, lasts for hours if not for a few days. When was on aimovig only had one about once a month.   chronic concerns:  IBS with diarrhea: uses [place], sees Dr. Allegra Lai   Anxiety, stress: three kids still at home. The kids like to fight a lot which causes her to feel even more stressed. At times this makes her feel as though she is very overwhelmed and wants to just pull her hair out. Easily irritated and agitated. In the past was on a medication    ROS: Negative unless specifically indicated above in HPI.   Current Outpatient Medications:    acetaminophen (TYLENOL) 325 MG tablet, Take 650 mg by mouth every 6 (six) hours as needed for moderate pain., Disp: , Rfl:    buPROPion (WELLBUTRIN XL) 150 MG 24 hr tablet, Take 1 tablet (150 mg total) by mouth daily., Disp: 90 tablet, Rfl:  0   cetirizine (ZYRTEC) 10 MG tablet, Take 10 mg by mouth daily., Disp: , Rfl: 5   cholestyramine (QUESTRAN) 4 g packet, TAKE 1 PACKET BY MOUTH 2 TIMES DAILY., Disp: 60 packet, Rfl: 0   omeprazole (PRILOSEC) 40 MG capsule, TAKE 1 CAPSULE (40 MG TOTAL) BY MOUTH DAILY., Disp: 90 capsule, Rfl: 0   promethazine (PHENERGAN) 25 MG tablet, Take 1 tablet (25 mg total) by mouth every 6 (six) hours as needed for nausea or vomiting., Disp: 5 tablet, Rfl: 0   rizatriptan (MAXALT) 5 MG tablet, Take 1 tablet (5 mg total) by mouth as needed for migraine. May repeat in 2 hours if needed, Disp: 10 tablet, Rfl: 0   AIMOVIG 140 MG/ML SOAJ, SMARTSIG:140 Milligram(s) SUB-Q Every 4 Weeks, Disp: 1 mL, Rfl: 5   dicyclomine (BENTYL) 10 MG capsule, Take 1 capsule (10 mg total) by mouth 4 (four) times daily -  before meals and at bedtime., Disp: 30 capsule, Rfl: 0   PROAIR HFA 108 (90 Base) MCG/ACT inhaler, Inhale 2 puffs into the lungs every 6 (six) hours as needed for wheezing or shortness of breath., Disp: , Rfl:  Past Medical History:  Diagnosis Date   Diverticulitis    GERD (gastroesophageal reflux disease)    History of bleeding ulcers    told not to take ibuprofen  History of bronchitis 27yrs ago   Migraine    Obesity    Sleep apnea    CPAP   Past Surgical History:  Procedure Laterality Date   CESAREAN SECTION N/A 04/08/2015   Procedure: CESAREAN SECTION;  Surgeon: Catalina Antigua, MD;  Location: [facility];  Service: Obstetrics;  Laterality: N/A;   CESAREAN SECTION WITH BILATERAL TUBAL LIGATION  04/08/2015   CHOLECYSTECTOMY  03/27/2014   Dr Lemar Livings   COLONOSCOPY WITH PROPOFOL N/A 12/18/2021   Procedure: COLONOSCOPY WITH PROPOFOL;  Surgeon: Toney Reil, MD;  Location: [facility] ENDOSCOPY;  Service: Gastroenterology;  Laterality: N/A;   ESOPHAGOGASTRODUODENOSCOPY N/A 05/19/2017   Procedure: ESOPHAGOGASTRODUODENOSCOPY (EGD);  Surgeon: Toney Reil, MD;  Location: [facility];  Service:  Endoscopy;  Laterality: N/A;   ESOPHAGOGASTRODUODENOSCOPY (EGD) WITH PROPOFOL N/A 12/18/2021   Procedure: ESOPHAGOGASTRODUODENOSCOPY (EGD) WITH PROPOFOL;  Surgeon: Toney Reil, MD;  Location: [facility] ENDOSCOPY;  Service: Gastroenterology;  Laterality: N/A;   GRADUATED/TOLERANCE TEST  03/06/2022   Good exercise tolerance-10:24 min.  Reached peak heart rate of 169 bpm equals 91% MPHR at 186 bpm.  No EKG changes, chest pain or dyspnea.   TONSILLECTOMY  03/10/2012   Procedure: TONSILLECTOMY;  Surgeon: Suzanna Obey, MD;  Location: [facility] OR;  Service: ENT;  Laterality: Bilateral;    Objective:   Today's Vitals: BP 108/64   Pulse 64   Temp 97.7 F (36.5 C)   Resp 16   Ht 5\' 5"  (1.651 m)   Wt 177 lb (80.3 kg)   LMP 02/04/2023 (Approximate)   SpO2 100%   BMI 29.45 kg/m   Physical Exam Constitutional:      General: She is not in acute distress.    Appearance: Normal appearance. She is obese. She is not ill-appearing, toxic-appearing or diaphoretic.  HENT:     Head: Normocephalic.  Cardiovascular:     Rate and Rhythm: Normal rate.  Pulmonary:     Effort: Pulmonary effort is normal.  Musculoskeletal:        General: Normal range of motion.  Neurological:     General: No focal deficit present.     Mental Status: She is alert and oriented to person, place, and time. Mental status is at baseline.  Psychiatric:        Mood and Affect: Mood normal.        Behavior: Behavior normal.        Thought Content: Thought content normal.        Judgment: Judgment normal.     Assessment & Plan:  Migraine with aura and with status migrainosus, not intractable Assessment & Plan: Not improving.  Restart aimovig 140 mg [place] injection qmonth, resent RX  Stop topiramate as was taking for weight loss.  Trial start rizatriptan 5 mg prn abrutor for migraines Hold off on neuro referral for now.  Ordering tsh sed rate and b12 pending results.   Orders: -     Aimovig; SMARTSIG:140 Milligram(s) SUB-Q  Every 4 Weeks  Dispense: 1 mL; Refill: 5 -     Rizatriptan Benzoate; Take 1 tablet (5 mg total) by mouth as needed for migraine. May repeat in 2 hours if needed  Dispense: 10 tablet; Refill: 0 -     Sedimentation rate -     TSH  Irritable bowel syndrome with diarrhea Assessment & Plan: Cholestyramine prn  Continue f/u with GI as scheduled Fodmap diet    Tobacco abuse Assessment & Plan: Smoking cessation instruction/counseling given:  counseled patient on the  dangers of tobacco use, advised patient to stop smoking, and reviewed strategies to maximize success  We will be giving wellbutrin for both anxiety and cessation attempt    Burning tongue Assessment & Plan: With migraine onset Ordering b12 pending results  Orders: -     Vitamin B12 -     TSH  Condyloma acuminatum of perianal region Assessment & Plan: Advised to call gyn for treatment options as currently with flare   Low blood potassium -     Basic metabolic panel  GAD (generalized anxiety disorder) Assessment & Plan: Start wellbutrin 150 mg XL once daily  Also goal for smoking cessation  No h/o seizures or eating disorders F/u two months  Orders: -     buPROPion HCl ER (XL); Take 1 tablet (150 mg total) by mouth daily.  Dispense: 90 tablet; Refill: 0  Screening for lipoid disorders -     Lipid panel  Mild intermittent asthma without complication Assessment & Plan: Stable.  Albuterol prn    Chronic gastric erosion Assessment & Plan: Continue ppi   Other orders -     ProAir HFA; Inhale 2 puffs into the lungs every 6 (six) hours as needed for wheezing or shortness of breath.    Follow-up: Return in about 2 months (around 04/13/2023) for f/u CPE.   Mort Sawyers, FNP

## 2023-02-11 NOTE — Assessment & Plan Note (Signed)
Stable  Albuterol prn

## 2023-02-11 NOTE — Assessment & Plan Note (Signed)
Continue ppi

## 2023-02-11 NOTE — Assessment & Plan Note (Signed)
Advised to call gyn for treatment options as currently with flare

## 2023-02-11 NOTE — Assessment & Plan Note (Signed)
Not improving.  Restart aimovig 140 mg [place] injection qmonth, resent RX  Stop topiramate as was taking for weight loss.  Trial start rizatriptan 5 mg prn abrutor for migraines Hold off on neuro referral for now.  Ordering tsh sed rate and b12 pending results.

## 2023-02-11 NOTE — Assessment & Plan Note (Signed)
Start wellbutrin 150 mg XL once daily  Also goal for smoking cessation  No h/o seizures or eating disorders F/u two months

## 2023-02-11 NOTE — Assessment & Plan Note (Signed)
Cholestyramine prn  Continue f/u with GI as scheduled Fodmap diet

## 2023-02-11 NOTE — Patient Instructions (Addendum)
 ------------------------------------    Start wellbutrin 150 mg every day for anxiety.   ------------------------------------  Stop topiramate Use maxalt as needed to try to get rid of a headache.  Restart aimovig injection.    Regards,   Mort Sawyers FNP-C

## 2023-02-11 NOTE — Assessment & Plan Note (Signed)
Smoking cessation instruction/counseling given:  counseled patient on the dangers of tobacco use, advised patient to stop smoking, and reviewed strategies to maximize success  We will be giving wellbutrin for both anxiety and cessation attempt

## 2023-03-02 ENCOUNTER — Other Ambulatory Visit: Payer: Self-pay | Admitting: Gastroenterology

## 2023-03-02 DIAGNOSIS — K9089 Other intestinal malabsorption: Secondary | ICD-10-CM

## 2023-03-23 ENCOUNTER — Other Ambulatory Visit: Payer: Self-pay | Admitting: Family

## 2023-03-23 DIAGNOSIS — Z20818 Contact with and (suspected) exposure to other bacterial communicable diseases: Secondary | ICD-10-CM

## 2023-03-23 MED ORDER — AZITHROMYCIN 250 MG PO TABS
ORAL_TABLET | ORAL | 0 refills | Status: AC
Start: 2023-03-23 — End: 2023-03-28

## 2023-03-23 NOTE — Progress Notes (Signed)
Pt came in with exposure to strep via her husband who was confirmed positive.  She is having a slight sore throat. Requesting treatment for strep as well.

## 2023-03-26 ENCOUNTER — Other Ambulatory Visit (HOSPITAL_COMMUNITY): Payer: Self-pay

## 2023-03-26 ENCOUNTER — Ambulatory Visit (INDEPENDENT_AMBULATORY_CARE_PROVIDER_SITE_OTHER): Payer: Medicaid Other | Admitting: Family

## 2023-03-26 ENCOUNTER — Encounter: Payer: Self-pay | Admitting: Family

## 2023-03-26 ENCOUNTER — Telehealth: Payer: Self-pay

## 2023-03-26 ENCOUNTER — Other Ambulatory Visit: Payer: Self-pay | Admitting: Family

## 2023-03-26 VITALS — BP 118/66 | HR 58 | Temp 97.4°F | Ht 65.0 in | Wt 185.0 lb

## 2023-03-26 DIAGNOSIS — E66811 Other obesity due to excess calories: Secondary | ICD-10-CM | POA: Insufficient documentation

## 2023-03-26 DIAGNOSIS — Z6830 Body mass index (BMI) 30.0-30.9, adult: Secondary | ICD-10-CM | POA: Diagnosis not present

## 2023-03-26 DIAGNOSIS — G43101 Migraine with aura, not intractable, with status migrainosus: Secondary | ICD-10-CM

## 2023-03-26 DIAGNOSIS — E6609 Other obesity due to excess calories: Secondary | ICD-10-CM | POA: Diagnosis not present

## 2023-03-26 MED ORDER — AIMOVIG 140 MG/ML ~~LOC~~ SOAJ: SUBCUTANEOUS | 5 refills | Status: DC

## 2023-03-26 MED ORDER — WEGOVY 0.5 MG/0.5ML ~~LOC~~ SOAJ
SUBCUTANEOUS | 2 refills | Status: DC
Start: 2023-03-26 — End: 2023-10-11

## 2023-03-26 MED ORDER — RIZATRIPTAN BENZOATE 5 MG PO TABS
5.0000 mg | ORAL_TABLET | ORAL | 0 refills | Status: DC | PRN
Start: 2023-03-26 — End: 2023-06-25

## 2023-03-26 MED ORDER — WEGOVY 0.25 MG/0.5ML ~~LOC~~ SOAJ
SUBCUTANEOUS | 0 refills | Status: DC
Start: 2023-03-26 — End: 2023-04-09

## 2023-03-26 NOTE — Telephone Encounter (Signed)
Mychart message sent to patient to make aware.  ?

## 2023-03-26 NOTE — Telephone Encounter (Signed)
Pharmacy Patient Advocate Encounter    Per test claim: PA required; PA submitted to [facility] Medicaid via CoverMyMeds Key/confirmation #/EOC [id number] Status is pending

## 2023-03-26 NOTE — Telephone Encounter (Signed)
Pharmacy Patient Advocate Encounter   Received notification from CoverMyMeds that prior authorization for [facility] 0.25MG /0.5ML auto-injectors is required/requested.   Insurance verification completed.   The patient is insured through [facility] [place] .   Per test claim: PA required; PA submitted to [facility] Medicaid via CoverMyMeds Key/confirmation #/EOC [id number] Status is pending

## 2023-03-26 NOTE — Progress Notes (Signed)
Established Patient Office Visit  Subjective:   Patient ID: Robyn Ortiz, female    DOB: Apr 16, 1986  Age: 37 y.o. MRN: [id number]  CC:  Chief Complaint  Patient presents with   Medical Management of Chronic Issues    C/o ongoing migraine. Not able to get Aimovig yet. Also, wants to discuss wt loss medication.     HPI: Robyn Ortiz is a 37 y.o. female presenting on 03/26/2023 for Medical Management of Chronic Issues (C/o ongoing migraine. Not able to get Aimovig yet. Also, wants to discuss wt loss medication. )   Migraines: still ongoing has had some relief with addition of maxalt 5 mg but again with every other day migraine, and still not able to fill aimovig. Will get nauseous and throw up, using phenergan as needed.   Obesity: putting on weight, hard to keep weight off.  Diet: has cut down on fried fatty foods, eats a low amount of carbs. Doesn't drink soda. She has gained 8 pounds since last visit in June. Doesn't eat a lot of sauces either or dressings.  Exercise: active job, three levels of stairs at work.  No known personal or family history of thyroid cancer.  Has had about three months phentermine in the past which worked well for her. She does admit to stress eating and has been more in the last month or so.   Wt Readings from Last 3 Encounters:  03/26/23 185 lb (83.9 kg)  02/11/23 177 lb (80.3 kg)  04/16/22 202 lb 6.4 oz (91.8 kg)       ROS: Negative unless specifically indicated above in HPI.   Relevant past medical history reviewed and updated as indicated.   Allergies and medications reviewed and updated.   Current Outpatient Medications:    acetaminophen (TYLENOL) 325 MG tablet, Take 650 mg by mouth every 6 (six) hours as needed for moderate pain., Disp: , Rfl:    buPROPion (WELLBUTRIN XL) 150 MG 24 hr tablet, Take 1 tablet (150 mg total) by mouth daily., Disp: 90 tablet, Rfl: 0   cetirizine (ZYRTEC) 10 MG tablet, Take 10 mg by mouth daily., Disp: , Rfl:  5   cholestyramine (QUESTRAN) 4 g packet, TAKE 1 PACKET BY MOUTH 2 TIMES DAILY., Disp: 60 packet, Rfl: 2   omeprazole (PRILOSEC) 40 MG capsule, TAKE 1 CAPSULE (40 MG TOTAL) BY MOUTH DAILY., Disp: 90 capsule, Rfl: 0   PROAIR HFA 108 (90 Base) MCG/ACT inhaler, Inhale 2 puffs into the lungs every 6 (six) hours as needed for wheezing or shortness of breath., Disp: , Rfl:    promethazine (PHENERGAN) 25 MG tablet, Take 1 tablet (25 mg total) by mouth every 6 (six) hours as needed for nausea or vomiting., Disp: 5 tablet, Rfl: 0   Semaglutide-Weight Management (WEGOVY) 0.25 MG/0.5ML SOAJ, Start 0.25 mg qweek for four weeks, then increase to 0.5 mg dose qweek, Disp: 2 mL, Rfl: 0   Semaglutide-Weight Management (WEGOVY) 0.5 MG/0.5ML SOAJ, After four weeks of 0.25 mg qweek, increase to 0.5 mg qweek, Disp: 2 mL, Rfl: 2   AIMOVIG 140 MG/ML SOAJ, SMARTSIG:140 Milligram(s) SUB-Q Every 4 Weeks, Disp: 1 mL, Rfl: 5   rizatriptan (MAXALT) 5 MG tablet, Take 1 tablet (5 mg total) by mouth as needed for migraine. May repeat in 2 hours if needed, Disp: 10 tablet, Rfl: 0  Allergies  Allergen Reactions   Codeine Other (See Comments)    Pt. States she had a seizure   Ibuprofen     Patient  was diagnosed with bleeding ulcers and advised not to take this medication.   Latex     Objective:   BP 118/66   Pulse (!) 58   Temp (!) 97.4 F (36.3 C) (Temporal)   Ht 5\' 5"  (1.651 m)   Wt 185 lb (83.9 kg)   LMP 03/02/2023   SpO2 100%   BMI 30.79 kg/m    Physical Exam Constitutional:      General: She is not in acute distress.    Appearance: Normal appearance. She is obese. She is not ill-appearing, toxic-appearing or diaphoretic.  HENT:     Head: Normocephalic.  Cardiovascular:     Rate and Rhythm: Normal rate and regular rhythm.  Pulmonary:     Effort: Pulmonary effort is normal.     Breath sounds: Normal breath sounds.  Musculoskeletal:        General: Normal range of motion.  Neurological:     General: No  focal deficit present.     Mental Status: She is alert and oriented to person, place, and time. Mental status is at baseline.  Psychiatric:        Mood and Affect: Mood normal.        Behavior: Behavior normal.        Thought Content: Thought content normal.        Judgment: Judgment normal.     Assessment & Plan:  Class 1 obesity due to excess calories without serious comorbidity with body mass index (BMI) of 30.0 to 30.9 in adult Assessment & Plan: Pt advised to work on diet and exercise as tolerated and advised I sent in a prescription for a weight loss medication called ZOXWRU.  If approved you can start this as prescribed. Sometimes we need to complete a prior auth prior to approval.  If this is not approved, I will try another medication. If not approved,  I will have to refer you to our weight loss clinic where they might have better approval odds, and can help you on your weight loss journey.  If approved, you will start 2.5 mg once weekly for four weeks, then increase to 5.0 mg once weekly if tolerating well after four weeks. Be sure to make a follow up appointment in three months after starting the medication so we can document weight loss, and monitor how you are doing on the medication.   The beneficiary does not have any FDA labeled contraindications to the requested agent including pregnancy, lactation, h/o medullary thyroid cancer or multiple endocrine neoplasia type II.   Orders: -     Wegovy; After four weeks of 0.25 mg qweek, increase to 0.5 mg qweek  Dispense: 2 mL; Refill: 2 -     Wegovy; Start 0.25 mg qweek for four weeks, then increase to 0.5 mg dose qweek  Dispense: 2 mL; Refill: 0  Migraine with aura and with status migrainosus, not intractable Assessment & Plan: Try again to fill aimovig , prior auth might be needed. Refill maxalt 5 mg prn headache.    Orders: -     Aimovig; SMARTSIG:140 Milligram(s) SUB-Q Every 4 Weeks  Dispense: 1 mL; Refill: 5 -      Rizatriptan Benzoate; Take 1 tablet (5 mg total) by mouth as needed for migraine. May repeat in 2 hours if needed  Dispense: 10 tablet; Refill: 0     Follow up plan: Return in about 3 months (around 06/26/2023) for f/u weight loss medication.  Mort Sawyers, FNP

## 2023-03-26 NOTE — Assessment & Plan Note (Addendum)
Pt advised to work on diet and exercise as tolerated and advised I sent in a prescription for a weight loss medication called Wegovy.  If approved you can start this as prescribed. Sometimes we need to complete a prior auth prior to approval.  If this is not approved, I will try another medication. If not approved,  I will have to refer you to our weight loss clinic where they might have better approval odds, and can help you on your weight loss journey.  If approved, you will start 2.5 mg once weekly for four weeks, then increase to 5.0 mg once weekly if tolerating well after four weeks. Be sure to make a follow up appointment in three months after starting the medication so we can document weight loss, and monitor how you are doing on the medication.   The beneficiary does not have any FDA labeled contraindications to the requested agent including pregnancy, lactation, h/o medullary thyroid cancer or multiple endocrine neoplasia type II.

## 2023-03-26 NOTE — Telephone Encounter (Signed)
Pharmacy Patient Advocate Encounter   Received notification from Patient Advice Request messages that prior authorization for Aimovig 140MG /ML auto-injectors is required/requested.   Insurance verification completed.   The patient is insured through [facility] [place] .   Per test claim: PA required; PA started via CoverMyMeds. KEY [id number].   Waiting on information from provider

## 2023-03-26 NOTE — Telephone Encounter (Signed)
She has had a tubal ligation.  So hcg preg should not be needed.

## 2023-03-26 NOTE — Telephone Encounter (Signed)
ERROR

## 2023-03-26 NOTE — Telephone Encounter (Signed)
Please submit PA for Aimovig.

## 2023-03-26 NOTE — Telephone Encounter (Signed)
Pharmacy Patient Advocate Encounter  Received notification from [facility] that Prior Authorization for Aimovig 140MG /ML auto-injectors has been APPROVED from 03/12/23 to 06/24/23. Ran test claim, Copay is $4. This test claim was processed through [facility]- copay amounts may vary at other pharmacies due to pharmacy/plan contracts, or as the patient moves through the different stages of their insurance plan.   PA #/Case ID/Reference #: [id number]

## 2023-03-26 NOTE — Assessment & Plan Note (Signed)
Try again to fill aimovig , prior auth might be needed. Refill maxalt 5 mg prn headache.

## 2023-03-29 ENCOUNTER — Other Ambulatory Visit (HOSPITAL_COMMUNITY): Payer: Self-pay

## 2023-03-29 NOTE — Telephone Encounter (Signed)
Pharmacy Patient Advocate Encounter  Received notification from [facility] Medicaid that Prior Authorization for [facility] 0.25MG /0.5ML auto-injectors has been DENIED. Please advise how you'd like to proceed. Full denial letter will be uploaded to the media tab. See denial reason below.   PA #/Case ID/Reference #: [id number]   Denial Reason: Plan requires chart notes/lab results stating no FDA labeled contraindications, including pregnancy, lactation, history of medullary thyroid cancer or multiple endocrine neoplasia type II

## 2023-03-29 NOTE — Telephone Encounter (Signed)
Just to clarify joellen, wegovy or aimovig? This thread says aimovig?

## 2023-03-29 NOTE — Telephone Encounter (Signed)
Can we look into wegovy as well for prior auth? Thanks

## 2023-03-29 NOTE — Telephone Encounter (Signed)
Duplicate request.  Prior auth needed for wegovy.

## 2023-03-29 NOTE — Telephone Encounter (Signed)
So sorry Berkley Harvey was received for  [employer]

## 2023-04-05 ENCOUNTER — Other Ambulatory Visit (HOSPITAL_COMMUNITY): Payer: Self-pay

## 2023-04-06 NOTE — Telephone Encounter (Signed)
 PA has been submitted and documented in separate encounter, please sign off on rx in this encounter as PA team is unable to resolve RX requests. Thank you

## 2023-04-07 ENCOUNTER — Telehealth: Payer: Self-pay

## 2023-04-07 NOTE — Telephone Encounter (Signed)
Received notification the appeal request was received by [facility] and a determination should be received by 04/21/2023.  Letter indexed to media tab.

## 2023-04-19 ENCOUNTER — Other Ambulatory Visit: Payer: Self-pay | Admitting: Gastroenterology

## 2023-04-19 ENCOUNTER — Other Ambulatory Visit: Payer: Self-pay | Admitting: Family

## 2023-04-19 DIAGNOSIS — F411 Generalized anxiety disorder: Secondary | ICD-10-CM

## 2023-04-20 NOTE — Telephone Encounter (Signed)
Can we re-submit prior auth now that I have added additional notation into patient's chart per medicaid to get approval for wegovy?

## 2023-04-21 ENCOUNTER — Ambulatory Visit
Admission: EM | Admit: 2023-04-21 | Discharge: 2023-04-21 | Disposition: A | Discharge status: Home / Self Care | Payer: Medicaid Other | Attending: Emergency Medicine | Admitting: Emergency Medicine

## 2023-04-21 ENCOUNTER — Other Ambulatory Visit (HOSPITAL_COMMUNITY): Payer: Self-pay

## 2023-04-21 DIAGNOSIS — B349 Viral infection, unspecified: Secondary | ICD-10-CM | POA: Insufficient documentation

## 2023-04-21 DIAGNOSIS — Z720 Tobacco use: Secondary | ICD-10-CM | POA: Insufficient documentation

## 2023-04-21 DIAGNOSIS — Z20822 Contact with and (suspected) exposure to covid-19: Secondary | ICD-10-CM

## 2023-04-21 LAB — SARS CORONAVIRUS 2 (TAT 6-24 HRS): SARS Coronavirus 2: NEGATIVE

## 2023-04-21 NOTE — ED Triage Notes (Signed)
Patient to Urgent Care with complaints of sore throat/ generalized body aches/ upset stomach/ fatigue.   Reports symptoms started yesterday. Covid exposures at work.

## 2023-04-21 NOTE — Discharge Instructions (Addendum)
Most likely you have a viral illness: no antibiotic as indicated at this time, May treat with OTC meds of choice. Make sure to drink plenty of fluids to stay hydrated(gatorade, water, popsicles,jello,etc), avoid caffeine products. Follow up with PCP. Check my chart for results. Return as needed. Stop vaping.

## 2023-04-21 NOTE — Telephone Encounter (Signed)
Pharmacy Patient Advocate Encounter  Received notification from [facility] that Prior Authorization for Reginal Lutes has been APPROVED from 03/26/23 to 10/17/23   PA #/Case ID/Reference #: [id number]  Approval letter indexed to media tab   Left a voicemail with [facility] to notify of the approval.

## 2023-04-21 NOTE — ED Provider Notes (Signed)
Robyn Ortiz    CSN: [id number] Arrival date & time: 04/21/23  1237      History   Chief Complaint Chief Complaint  Patient presents with   Generalized Body Aches    HPI Robyn Ortiz is a 37 y.o. female.   37 year old female, Robyn Ortiz, presents to urgent care for evaluation of sore throat, body aches, fatigue,upsset stomach since yesterday. Possible covid exposure at work. Daughter here with similar symptoms.  The history is provided by the patient. No language interpreter was used.    Past Medical History:  Diagnosis Date   Diverticulitis    GERD (gastroesophageal reflux disease)    History of bleeding ulcers    told not to take ibuprofen   History of bronchitis 2yrs ago   Migraine    Obesity    Sleep apnea    CPAP    Patient Active Problem List   Diagnosis Date Noted   Viral illness 04/21/2023   Exposure to COVID-19 virus 04/21/2023   Class 1 obesity due to excess calories without serious comorbidity with body mass index (BMI) of 30.0 to 30.9 in adult 03/26/2023   Irritable bowel syndrome with diarrhea 02/11/2023   Vapes nicotine containing substance 02/11/2023   GAD (generalized anxiety disorder) 02/11/2023   Gastric erosion    Eczema 09/04/2014   Asthma 11/21/2012   Current smoker 11/21/2012   Condyloma acuminatum of perianal region 11/21/2012   Migraine 11/21/2012    Past Surgical History:  Procedure Laterality Date   CESAREAN SECTION N/A 04/08/2015   Procedure: CESAREAN SECTION;  Surgeon: Catalina Antigua, MD;  Location: [facility];  Service: Obstetrics;  Laterality: N/A;   CESAREAN SECTION WITH BILATERAL TUBAL LIGATION  04/08/2015   CHOLECYSTECTOMY  03/27/2014   Dr Lemar Livings   COLONOSCOPY WITH PROPOFOL N/A 12/18/2021   Procedure: COLONOSCOPY WITH PROPOFOL;  Surgeon: Toney Reil, MD;  Location: [facility] ENDOSCOPY;  Service: Gastroenterology;  Laterality: N/A;   ESOPHAGOGASTRODUODENOSCOPY N/A 05/19/2017   Procedure:  ESOPHAGOGASTRODUODENOSCOPY (EGD);  Surgeon: Toney Reil, MD;  Location: [facility];  Service: Endoscopy;  Laterality: N/A;   ESOPHAGOGASTRODUODENOSCOPY (EGD) WITH PROPOFOL N/A 12/18/2021   Procedure: ESOPHAGOGASTRODUODENOSCOPY (EGD) WITH PROPOFOL;  Surgeon: Toney Reil, MD;  Location: [facility] ENDOSCOPY;  Service: Gastroenterology;  Laterality: N/A;   GRADUATED/TOLERANCE TEST  03/06/2022   Good exercise tolerance-10:24 min.  Reached peak heart rate of 169 bpm equals 91% MPHR at 186 bpm.  No EKG changes, chest pain or dyspnea.   TONSILLECTOMY  03/10/2012   Procedure: TONSILLECTOMY;  Surgeon: Suzanna Obey, MD;  Location: [facility] OR;  Service: ENT;  Laterality: Bilateral;    OB History     Gravida  3   Para  3   Term  3   Preterm      AB      Living  3      SAB      IAB      Ectopic      Multiple  0   Live Births  3        Obstetric Comments  1st Menstrual Cycle:  14  1st Pregnancy:  17          Home Medications    Prior to Admission medications   Medication Sig Start Date End Date Taking? Authorizing Provider  montelukast (SINGULAIR) 10 MG tablet Take 10 mg by mouth daily. 04/19/23  Yes [provider]  acetaminophen (TYLENOL) 325 MG tablet Take 650 mg by mouth  every 6 (six) hours as needed for moderate pain.    [provider]  AIMOVIG 140 MG/ML SOAJ SMARTSIG:140 Milligram(s) SUB-Q Every 4 Weeks 03/26/23   Mort Sawyers, FNP  buPROPion (WELLBUTRIN XL) 150 MG 24 hr tablet TAKE 1 TABLET BY MOUTH EVERY DAY 04/20/23   Mort Sawyers, FNP  cetirizine (ZYRTEC) 10 MG tablet Take 10 mg by mouth daily. 04/04/17   [provider]  cholestyramine (QUESTRAN) 4 g packet TAKE 1 PACKET BY MOUTH 2 TIMES DAILY. 03/02/23   Toney Reil, MD  omeprazole (PRILOSEC) 40 MG capsule TAKE 1 CAPSULE (40 MG TOTAL) BY MOUTH DAILY. 04/20/23   Toney Reil, MD  PROAIR HFA 108 [phone] Base) MCG/ACT inhaler Inhale 2 puffs into the lungs every 6 (six)  hours as needed for wheezing or shortness of breath. 02/11/23   Mort Sawyers, FNP  promethazine (PHENERGAN) 25 MG tablet Take 1 tablet (25 mg total) by mouth every 6 (six) hours as needed for nausea or vomiting. 12/26/22   Immordino, Jeannett Senior, FNP  rizatriptan (MAXALT) 5 MG tablet Take 1 tablet (5 mg total) by mouth as needed for migraine. May repeat in 2 hours if needed 03/26/23   Mort Sawyers, FNP  Semaglutide-Weight Management (WEGOVY) 0.25 MG/0.5ML SOAJ INJECT 0.25MG  DOSE UNDER THE SKIN ONCE WEEKLY FOR 4 WEEKS, THEN INCREASE TO 0.5MG  WEEKLY 04/09/23   Mort Sawyers, FNP  Semaglutide-Weight Management (WEGOVY) 0.5 MG/0.5ML SOAJ After four weeks of 0.25 mg qweek, increase to 0.5 mg qweek 03/26/23   Mort Sawyers, FNP    Family History Family History  Problem Relation Age of Onset   Bipolar disorder Mother    Schizophrenia Mother    Diabetes Father    Heart disease Sister    Suicidality Brother    Heart disease Maternal Grandmother    Alcohol abuse Maternal Grandfather    Gastric cancer Paternal Grandfather    Arthritis Paternal Grandfather     Social History Social History   Tobacco Use   Smoking status: Every Day    Current packs/day: 1.00    Average packs/day: 1 pack/day for 13.0 years (13.0 ttl pk-yrs)    Types: Cigarettes   Smokeless tobacco: Never  Vaping Use   Vaping status: Never Used  Substance Use Topics   Alcohol use: Yes    Comment: beer occasionally   Drug use: No     Allergies   Codeine, Ibuprofen, and Latex   Review of Systems Review of Systems  Constitutional:  Positive for fatigue. Negative for fever.  HENT:  Positive for sore throat.   Respiratory:  Negative for cough.   Gastrointestinal:  Positive for diarrhea.  Musculoskeletal:  Positive for myalgias.  All other systems reviewed and are negative.    Physical Exam Triage Vital Signs ED Triage Vitals  Encounter Vitals Group     BP 04/21/23 1248 109/73     Systolic BP Percentile --       Diastolic BP Percentile --      Pulse Rate 04/21/23 1248 64     Resp 04/21/23 1248 16     Temp 04/21/23 1248 98.2 F (36.8 C)     Temp Source 04/21/23 1248 Oral     SpO2 04/21/23 1248 99 %     Weight --      Height --      Head Circumference --      Peak Flow --      Pain Score 04/21/23 1246 8     Pain  Loc --      Pain Education --      Exclude from Growth Chart --    No data found.  Updated Vital Signs BP 109/73 (BP Location: Left Arm)   Pulse 64   Temp 98.2 F (36.8 C) (Oral)   Resp 16   LMP 04/02/2023   SpO2 99%   Visual Acuity Right Eye Distance:   Left Eye Distance:   Bilateral Distance:    Right Eye Near:   Left Eye Near:    Bilateral Near:     Physical Exam Vitals and nursing note reviewed.  Constitutional:      General: She is not in acute distress.    Appearance: She is well-developed.  HENT:     Head: Normocephalic.     Right Ear: Tympanic membrane is retracted.     Left Ear: Tympanic membrane is retracted.     Nose: Congestion present.     Mouth/Throat:     Lips: Pink.     Mouth: Mucous membranes are moist.     Pharynx: Oropharynx is clear.  Eyes:     General: Lids are normal.     Conjunctiva/sclera: Conjunctivae normal.     Pupils: Pupils are equal, round, and reactive to light.  Neck:     Trachea: No tracheal deviation.  Cardiovascular:     Rate and Rhythm: Normal rate and regular rhythm.     Pulses: Normal pulses.     Heart sounds: Normal heart sounds. No murmur heard. Pulmonary:     Effort: Pulmonary effort is normal.     Breath sounds: Normal breath sounds and air entry.  Abdominal:     General: Bowel sounds are normal.     Palpations: Abdomen is soft.     Tenderness: There is no abdominal tenderness.  Musculoskeletal:        General: Normal range of motion.     Cervical back: Normal range of motion.  Lymphadenopathy:     Cervical: No cervical adenopathy.  Skin:    General: Skin is warm and dry.     Findings: No rash.   Neurological:     General: No focal deficit present.     Mental Status: She is alert and oriented to person, place, and time.     GCS: GCS eye subscore is 4. GCS verbal subscore is 5. GCS motor subscore is 6.  Psychiatric:        Attention and Perception: Attention normal.        Mood and Affect: Mood normal.        Speech: Speech normal.        Behavior: Behavior normal. Behavior is cooperative.      UC Treatments / Results  Labs (all labs ordered are listed, but only abnormal results are displayed) Labs Reviewed  SARS CORONAVIRUS 2 (TAT 6-24 HRS)    EKG   Radiology No results found.  Procedures Procedures (including critical care time)  Medications Ordered in UC Medications - No data to display  Initial Impression / Assessment and Plan / UC Course  I have reviewed the triage vital signs and the nursing notes.  Pertinent labs & imaging results that were available during my care of the patient were reviewed by me and considered in my medical decision making (see chart for details).    Discussed exam findings and plan of care with patient, strict go to ER precautions given.   Patient verbalized understanding to this provider.  Ddx: Viral  illness, Covid exposure, allergies Final Clinical Impressions(s) / UC Diagnoses   Final diagnoses:  Viral illness  Exposure to COVID-19 virus  Vapes nicotine containing substance     Discharge Instructions      Most likely you have a viral illness: no antibiotic as indicated at this time, May treat with OTC meds of choice. Make sure to drink plenty of fluids to stay hydrated(gatorade, water, popsicles,jello,etc), avoid caffeine products. Follow up with PCP. Check my chart for results. Return as needed. Stop vaping.     ED Prescriptions   None    PDMP not reviewed this encounter.   Clancy Gourd, NP 04/21/23 1739

## 2023-04-22 ENCOUNTER — Ambulatory Visit (INDEPENDENT_AMBULATORY_CARE_PROVIDER_SITE_OTHER): Payer: Medicaid Other | Admitting: Internal Medicine

## 2023-04-22 ENCOUNTER — Encounter: Payer: Self-pay | Admitting: Internal Medicine

## 2023-04-22 VITALS — BP 100/62 | HR 70 | Temp 97.9°F | Ht 65.0 in | Wt 185.0 lb

## 2023-04-22 DIAGNOSIS — J069 Acute upper respiratory infection, unspecified: Secondary | ICD-10-CM | POA: Insufficient documentation

## 2023-04-22 NOTE — Progress Notes (Signed)
Subjective:    Patient ID: Robyn Ortiz, female    DOB: [birth year], 37 y.o.   MRN: [id number]  HPI Here due to respiratory infection  Feels worse today--doesn't feel any better since urgent care visit yesterday  Started 2 days ago Sore throat, stuffy nose--now draining Facial pressure--maxillary Headache, nose bleeds, body aches Upset stomach No fever Having cold and hot spells Some cough---using cough drops (help some) No SOB  Using mucinex DM--has helped cough Tylenol also  Current Outpatient Medications on File Prior to Visit  Medication Sig Dispense Refill   acetaminophen (TYLENOL) 325 MG tablet Take 650 mg by mouth every 6 (six) hours as needed for moderate pain.     AIMOVIG 140 MG/ML SOAJ SMARTSIG:140 Milligram(s) SUB-Q Every 4 Weeks 1 mL 5   buPROPion (WELLBUTRIN XL) 150 MG 24 hr tablet TAKE 1 TABLET BY MOUTH EVERY DAY 90 tablet 0   cetirizine (ZYRTEC) 10 MG tablet Take 10 mg by mouth daily.  5   cholestyramine (QUESTRAN) 4 g packet TAKE 1 PACKET BY MOUTH 2 TIMES DAILY. 60 packet 2   montelukast (SINGULAIR) 10 MG tablet Take 10 mg by mouth daily.     omeprazole (PRILOSEC) 40 MG capsule TAKE 1 CAPSULE (40 MG TOTAL) BY MOUTH DAILY. 90 capsule 0   PROAIR HFA 108 (90 Base) MCG/ACT inhaler Inhale 2 puffs into the lungs every 6 (six) hours as needed for wheezing or shortness of breath.     promethazine (PHENERGAN) 25 MG tablet Take 1 tablet (25 mg total) by mouth every 6 (six) hours as needed for nausea or vomiting. 5 tablet 0   rizatriptan (MAXALT) 5 MG tablet Take 1 tablet (5 mg total) by mouth as needed for migraine. May repeat in 2 hours if needed 10 tablet 0   Semaglutide-Weight Management (WEGOVY) 0.25 MG/0.5ML SOAJ INJECT 0.25MG  DOSE UNDER THE SKIN ONCE WEEKLY FOR 4 WEEKS, THEN INCREASE TO 0.5MG  WEEKLY (Patient not taking: Reported on 04/22/2023) 3 mL 0   Semaglutide-Weight Management (WEGOVY) 0.5 MG/0.5ML SOAJ After four weeks of 0.25 mg qweek, increase to 0.5 mg qweek  (Patient not taking: Reported on 04/22/2023) 2 mL 2   No current facility-administered medications on file prior to visit.    Allergies  Allergen Reactions   Codeine Other (See Comments)    Pt. States she had a seizure   Ibuprofen     Patient was diagnosed with bleeding ulcers and advised not to take this medication.   Latex     Past Medical History:  Diagnosis Date   Diverticulitis    GERD (gastroesophageal reflux disease)    History of bleeding ulcers    told not to take ibuprofen   History of bronchitis 58yrs ago   Migraine    Obesity    Sleep apnea    CPAP    Past Surgical History:  Procedure Laterality Date   CESAREAN SECTION N/A 04/08/2015   Procedure: CESAREAN SECTION;  Surgeon: Catalina Antigua, MD;  Location: [facility];  Service: Obstetrics;  Laterality: N/A;   CESAREAN SECTION WITH BILATERAL TUBAL LIGATION  04/08/2015   CHOLECYSTECTOMY  03/27/2014   Dr Lemar Livings   COLONOSCOPY WITH PROPOFOL N/A 12/18/2021   Procedure: COLONOSCOPY WITH PROPOFOL;  Surgeon: Toney Reil, MD;  Location: [facility] ENDOSCOPY;  Service: Gastroenterology;  Laterality: N/A;   ESOPHAGOGASTRODUODENOSCOPY N/A 05/19/2017   Procedure: ESOPHAGOGASTRODUODENOSCOPY (EGD);  Surgeon: Toney Reil, MD;  Location: [facility];  Service: Endoscopy;  Laterality: N/A;   ESOPHAGOGASTRODUODENOSCOPY (EGD) WITH  PROPOFOL N/A 12/18/2021   Procedure: ESOPHAGOGASTRODUODENOSCOPY (EGD) WITH PROPOFOL;  Surgeon: Toney Reil, MD;  Location: [facility] ENDOSCOPY;  Service: Gastroenterology;  Laterality: N/A;   GRADUATED/TOLERANCE TEST  03/06/2022   Good exercise tolerance-10:24 min.  Reached peak heart rate of 169 bpm equals 91% MPHR at 186 bpm.  No EKG changes, chest pain or dyspnea.   TONSILLECTOMY  03/10/2012   Procedure: TONSILLECTOMY;  Surgeon: Suzanna Obey, MD;  Location: [facility] OR;  Service: ENT;  Laterality: Bilateral;    Family History  Problem Relation Age of Onset   Bipolar disorder Mother     Schizophrenia Mother    Diabetes Father    Heart disease Sister    Suicidality Brother    Heart disease Maternal Grandmother    Alcohol abuse Maternal Grandfather    Gastric cancer Paternal Grandfather    Arthritis Paternal Grandfather     Social History   Socioeconomic History   Marital status: Married    Spouse name: Not on file   Number of children: Not on file   Years of education: Not on file   Highest education level: Not on file  Occupational History   Occupation: [employer]    Comment: [employer]  Tobacco Use   Smoking status: Every Day    Current packs/day: 1.00    Average packs/day: 1 pack/day for 13.0 years (13.0 ttl pk-yrs)    Types: Cigarettes   Smokeless tobacco: Never  Vaping Use   Vaping status: Never Used  Substance and Sexual Activity   Alcohol use: Yes    Comment: beer occasionally   Drug use: No   Sexual activity: Yes    Birth control/protection: Surgical    Comment: BTL  Other Topics Concern   Not on file  Social History Narrative   3 biological    1 step    And two adopted (niece and nephew)   Social Determinants of Health   Financial Resource Strain: Not on file  Food Insecurity: Not on file  Transportation Needs: Not on file  Physical Activity: Not on file  Stress: Not on file  Social Connections: Not on file  Intimate Partner Violence: Not on file   Review of Systems No diarrhea --but sons who are also sick have this (and daughter) Some nausea --but no vomiting Still able to eat and drink--but appetite is off Has been out of work this week    Objective:   Physical Exam Constitutional:      Appearance: Normal appearance.  HENT:     Head:     Comments: Maxillary and frontal tenderness    Right Ear: Tympanic membrane and ear canal normal.     Left Ear: Tympanic membrane and ear canal normal.     Mouth/Throat:     Pharynx: No oropharyngeal exudate or posterior oropharyngeal erythema.  Pulmonary:     Effort: Pulmonary  effort is normal.     Breath sounds: Normal breath sounds. No wheezing or rales.  Musculoskeletal:     Cervical back: Neck supple.  Lymphadenopathy:     Cervical: No cervical adenopathy.  Neurological:     Mental Status: She is alert.            Assessment & Plan:

## 2023-04-22 NOTE — Assessment & Plan Note (Addendum)
COVID negative Not really flu like Discussed analgesics, DM cough med Discussed adding cetirizine 10 or fexofenadine 180 daily for allergy season If persists or worsens next week, will send Rx for doxy 100 bid x 7 days

## 2023-04-27 ENCOUNTER — Ambulatory Visit: Payer: Medicaid Other | Admitting: Gastroenterology

## 2023-04-27 ENCOUNTER — Encounter: Payer: Self-pay | Admitting: Internal Medicine

## 2023-04-28 MED ORDER — DOXYCYCLINE HYCLATE 100 MG PO TABS
100.0000 mg | ORAL_TABLET | Freq: Two times a day (BID) | ORAL | 0 refills | Status: DC
Start: 2023-04-28 — End: 2023-05-12

## 2023-05-05 ENCOUNTER — Ambulatory Visit: Payer: Medicaid Other | Admitting: Gastroenterology

## 2023-05-06 ENCOUNTER — Other Ambulatory Visit: Payer: Self-pay

## 2023-05-12 ENCOUNTER — Encounter: Payer: Self-pay | Admitting: Gastroenterology

## 2023-05-12 ENCOUNTER — Ambulatory Visit (INDEPENDENT_AMBULATORY_CARE_PROVIDER_SITE_OTHER): Payer: Medicaid Other | Admitting: Gastroenterology

## 2023-05-12 VITALS — BP 103/71 | HR 67 | Temp 98.0°F | Ht 65.0 in | Wt 187.5 lb

## 2023-05-12 DIAGNOSIS — R140 Abdominal distension (gaseous): Secondary | ICD-10-CM | POA: Diagnosis not present

## 2023-05-12 DIAGNOSIS — R1012 Left upper quadrant pain: Secondary | ICD-10-CM

## 2023-05-12 DIAGNOSIS — R1013 Epigastric pain: Secondary | ICD-10-CM | POA: Diagnosis not present

## 2023-05-12 NOTE — Progress Notes (Signed)
Arlyss Repress, MD [address]  [address]  [place], [place] [address]  Main: [phone]  Fax: [phone]    Gastroenterology Consultation  Referring Provider:     Mort Sawyers, FNP Primary Care Physician:  Mort Sawyers, FNP Primary Gastroenterologist:  Dr. Arlyss Repress Reason for Consultation: Dyspepsia        HPI:   Robyn Ortiz is a 37 y.o. female referred by Mort Sawyers, FNP  for consultation & management of dyspepsia.  Patient states that she started [facility] 2 weeks ago, switch from phentermine after being on 6 months.  Her weight was down to 177 pounds end of June, has been regaining weight since discontinuation of phentermine.  She states that she started [facility] about 2 weeks ago.  For last 2 weeks, she has been experiencing left upper side abdominal pain that goes across her upper abdomen associated with nausea and postprandial abdominal bloating, heartburn.  She is currently on omeprazole 40 mg daily.  She underwent EGD in 12/2021 which revealed gastric erosions and mild erosive esophagitis.  Colonoscopy was unremarkable including biopsies.  She denies family history of GI malignancy GI Procedures: EGD 06/05/2017 normal  EGD and colonoscopy 12/18/2021 - Normal duodenal bulb and second portion of the duodenum. Biopsied. - Erosive gastropathy with no bleeding and no stigmata of recent bleeding. Biopsied. - Normal gastric body. Biopsied. - Esophagogastric landmarks identified. - LA Grade A reflux esophagitis with no bleeding.  - The examined portion of the ileum was normal. - Erythematous mucosa in the sigmoid colon. Biopsied. - Normal mucosa in the descending colon and in the right colon. Biopsied. - The distal rectum and anal verge are normal on retroflexion view. - Diverticulosis in the sigmoid colon. DIAGNOSIS:  A. DUODENUM; COLD BIOPSY:  - ENTERIC MUCOSA WITH PRESERVED VILLOUS ARCHITECTURE AND NO SIGNIFICANT  HISTOPATHOLOGIC CHANGE.  - NEGATIVE FOR  FEATURES OF CELIAC, DYSPLASIA, AND MALIGNANCY.   B. STOMACH; COLD BIOPSY:  - GASTRIC ANTRAL AND OXYNTIC MUCOSA WITH FEATURES OF MILD REACTIVE  GASTROPATHY.  - NEGATIVE FOR H. PYLORI, DYSPLASIA, AND MALIGNANCY.   Comment:  The differential diagnosis for these findings includes drug/chemical  injury (NSAID vs. other), bile reflux, and changes adjacent to an area  of healing ulceration. Clinical correlation with endoscopic findings is  required.   C. COLON, RIGHT; COLD BIOPSY:  - BENIGN COLONIC MUCOSA WITH NO SIGNIFICANT HISTOPATHOLOGIC CHANGE.  - NEGATIVE FOR ACTIVE MUCOSAL COLITIS, FEATURES OF MICROSCOPIC COLITIS,  AND ARCHITECTURAL CHANGES OF CHRONICITY.  - NEGATIVE FOR DYSPLASIA AND MALIGNANCY.   D. COLON, DESCENDING; COLD BIOPSY:  - BENIGN COLONIC MUCOSA WITH NO SIGNIFICANT HISTOPATHOLOGIC CHANGE.  - NEGATIVE FOR ACTIVE MUCOSAL COLITIS, FEATURES OF MICROSCOPIC COLITIS,  AND ARCHITECTURAL CHANGES OF CHRONICITY.  - NEGATIVE FOR DYSPLASIA AND MALIGNANCY.   E. COLON ERYTHEMA, SIGMOID; COLD BIOPSY:  - BENIGN COLONIC MUCOSA WITH MILD LAMINA PROPRIA FIBROSIS AND  SUPERFICIAL REACTIVE/HYPERPLASTIC CHANGES.  - NEGATIVE FOR ACTIVE MUCOSAL COLITIS, DYSPLASIA, AND MALIGNANCY.   Past Medical History:  Diagnosis Date   Diverticulitis    GERD (gastroesophageal reflux disease)    History of bleeding ulcers    told not to take ibuprofen   History of bronchitis 11yrs ago   Migraine    Obesity    Sleep apnea    CPAP    Past Surgical History:  Procedure Laterality Date   CESAREAN SECTION N/A 04/08/2015   Procedure: CESAREAN SECTION;  Surgeon: Catalina Antigua, MD;  Location: [facility];  Service: Obstetrics;  Laterality: N/A;   CESAREAN SECTION WITH BILATERAL TUBAL LIGATION  04/08/2015   CHOLECYSTECTOMY  03/27/2014   Dr Lemar Livings   COLONOSCOPY WITH PROPOFOL N/A 12/18/2021   Procedure: COLONOSCOPY WITH PROPOFOL;  Surgeon: Toney Reil, MD;  Location: [facility] ENDOSCOPY;  Service:  Gastroenterology;  Laterality: N/A;   ESOPHAGOGASTRODUODENOSCOPY N/A 05/19/2017   Procedure: ESOPHAGOGASTRODUODENOSCOPY (EGD);  Surgeon: Toney Reil, MD;  Location: [facility];  Service: Endoscopy;  Laterality: N/A;   ESOPHAGOGASTRODUODENOSCOPY (EGD) WITH PROPOFOL N/A 12/18/2021   Procedure: ESOPHAGOGASTRODUODENOSCOPY (EGD) WITH PROPOFOL;  Surgeon: Toney Reil, MD;  Location: [facility] ENDOSCOPY;  Service: Gastroenterology;  Laterality: N/A;   GRADUATED/TOLERANCE TEST  03/06/2022   Good exercise tolerance-10:24 min.  Reached peak heart rate of 169 bpm equals 91% MPHR at 186 bpm.  No EKG changes, chest pain or dyspnea.   TONSILLECTOMY  03/10/2012   Procedure: TONSILLECTOMY;  Surgeon: Suzanna Obey, MD;  Location: [facility] OR;  Service: ENT;  Laterality: Bilateral;     Current Outpatient Medications:    acetaminophen (TYLENOL) 325 MG tablet, Take 650 mg by mouth every 6 (six) hours as needed for moderate pain., Disp: , Rfl:    AIMOVIG 140 MG/ML SOAJ, SMARTSIG:140 Milligram(s) SUB-Q Every 4 Weeks, Disp: 1 mL, Rfl: 5   cetirizine (ZYRTEC) 10 MG tablet, Take 10 mg by mouth daily., Disp: , Rfl: 5   cholestyramine (QUESTRAN) 4 g packet, TAKE 1 PACKET BY MOUTH 2 TIMES DAILY., Disp: 60 packet, Rfl: 2   montelukast (SINGULAIR) 10 MG tablet, Take 10 mg by mouth daily., Disp: , Rfl:    PROAIR HFA 108 (90 Base) MCG/ACT inhaler, Inhale 2 puffs into the lungs every 6 (six) hours as needed for wheezing or shortness of breath., Disp: , Rfl:    promethazine (PHENERGAN) 25 MG tablet, Take 1 tablet (25 mg total) by mouth every 6 (six) hours as needed for nausea or vomiting., Disp: 5 tablet, Rfl: 0   rizatriptan (MAXALT) 5 MG tablet, Take 1 tablet (5 mg total) by mouth as needed for migraine. May repeat in 2 hours if needed, Disp: 10 tablet, Rfl: 0   Semaglutide-Weight Management (WEGOVY) 0.5 MG/0.5ML SOAJ, After four weeks of 0.25 mg qweek, increase to 0.5 mg qweek, Disp: 2 mL, Rfl: 2   Family History   Problem Relation Age of Onset   Bipolar disorder Mother    Schizophrenia Mother    Diabetes Father    Heart disease Sister    Suicidality Brother    Heart disease Maternal Grandmother    Alcohol abuse Maternal Grandfather    Gastric cancer Paternal Grandfather    Arthritis Paternal Grandfather      Social History   Tobacco Use   Smoking status: Every Day    Current packs/day: 1.00    Average packs/day: 1 pack/day for 13.0 years (13.0 ttl pk-yrs)    Types: Cigarettes   Smokeless tobacco: Never  Vaping Use   Vaping status: Never Used  Substance Use Topics   Alcohol use: Yes    Comment: beer occasionally   Drug use: No    Allergies as of 05/12/2023 - Review Complete 05/12/2023  Allergen Reaction Noted   Codeine Other (See Comments) 01/26/2012   Latex  08/12/2021    Review of Systems:    All systems reviewed and negative except where noted in HPI.   Physical Exam:  BP 103/71 (BP Location: Left Arm, Patient Position: Sitting, Cuff Size: Normal)   Pulse 67   Temp 98 F (36.7  C) (Oral)   Ht 5\' 5"  (1.651 m)   Wt 187 lb 8 oz (85 kg)   LMP 04/02/2023   BMI 31.20 kg/m  Patient's last menstrual period was 04/02/2023.  General:   Alert,  Well-developed, well-nourished, pleasant and cooperative in NAD Head:  Normocephalic and atraumatic. Eyes:  Sclera clear, no icterus.   Conjunctiva pink. Ears:  Normal auditory acuity. Nose:  No deformity, discharge, or lesions. Mouth:  No deformity or lesions,oropharynx pink & moist. Neck:  Supple; no masses or thyromegaly. Lungs:  Respirations even and unlabored.  Clear throughout to auscultation.   No wheezes, crackles, or rhonchi. No acute distress. Heart:  Regular rate and rhythm; no murmurs, clicks, rubs, or gallops. Abdomen:  Normal bowel sounds.  No bruits.  Soft, non-tender and non-distended without masses, hepatosplenomegaly or hernias noted.  No guarding or rebound tenderness.   Rectal: Normal perianal exam, nontender  digital rectal exam Msk:  Symmetrical without gross deformities. Good, equal movement & strength bilaterally. Pulses:  Normal pulses noted. Extremities:  No clubbing or edema.  No cyanosis. Neurologic:  Alert and oriented x3;  grossly normal neurologically. Skin:  Intact without significant lesions or rashes. No jaundice. Psych:  Alert and cooperative. Normal mood and affect.  Imaging Studies: Reviewed  Assessment and Plan:   Robyn Ortiz is a 37 y.o. female status postcholecystectomy with chronic diarrhea, diagnosed with C. difficile infection in October 2022, s/p treatment with oral vancomycin.  EGD and colonoscopy with biopsies were unremarkable for chronic diarrhea.  Diarrhea has resolved with cholestyramine. She then had constipation, rectal bleeding, underwent hemorrhoid ligation.  Constipation has resolved along with rectal bleeding.  Patient was previously on phentermine for weight loss for 6 months, lost weight down to 170s and was feeling good.  Phentermine has been discontinued, started regaining weight, Wegovy started 2 weeks ago.  Patient presents with 2 weeks of symptoms of left-sided upper abdominal discomfort, epigastric discomfort with postprandial abdominal bloating as well as heartburn.  Likely secondary to [facility], encouraged to have small portion meal, continue exercise 5 days a week.  Take Zofran as needed Also, encouraged her to try higher dose on Wegovy  Increase omeprazole to 40 mg p.o. twice daily before meals given history of acid reflux   Follow up as needed Asked her to contact me via my chart as needed  Arlyss Repress, MD

## 2023-05-18 ENCOUNTER — Ambulatory Visit (INDEPENDENT_AMBULATORY_CARE_PROVIDER_SITE_OTHER): Payer: Medicaid Other | Admitting: Family Medicine

## 2023-05-18 ENCOUNTER — Telehealth: Payer: Self-pay

## 2023-05-18 ENCOUNTER — Encounter: Payer: Self-pay | Admitting: Family Medicine

## 2023-05-18 VITALS — BP 120/78 | HR 63 | Temp 97.8°F | Ht 65.0 in | Wt 185.1 lb

## 2023-05-18 DIAGNOSIS — R52 Pain, unspecified: Secondary | ICD-10-CM

## 2023-05-18 DIAGNOSIS — J4520 Mild intermittent asthma, uncomplicated: Secondary | ICD-10-CM

## 2023-05-18 DIAGNOSIS — Z87891 Personal history of nicotine dependence: Secondary | ICD-10-CM | POA: Diagnosis not present

## 2023-05-18 DIAGNOSIS — J029 Acute pharyngitis, unspecified: Secondary | ICD-10-CM

## 2023-05-18 DIAGNOSIS — J069 Acute upper respiratory infection, unspecified: Secondary | ICD-10-CM | POA: Diagnosis not present

## 2023-05-18 LAB — POC COVID19 BINAXNOW: SARS Coronavirus 2 Ag: NEGATIVE

## 2023-05-18 LAB — POCT RAPID STREP A (OFFICE): Rapid Strep A Screen: NEGATIVE

## 2023-05-18 LAB — POCT INFLUENZA A/B
Influenza A, POC: NEGATIVE
Influenza B, POC: NEGATIVE

## 2023-05-18 NOTE — Assessment & Plan Note (Addendum)
Anticipate viral upper respiratory infection given short duration.  Supportive measures encouraged as per instructions. Update if not improving with treatment as expected.  Red flags to seek further care reviewed.

## 2023-05-18 NOTE — Assessment & Plan Note (Signed)
Lungs clear. No current asthma exacerbation. Continue PRN albuterol.

## 2023-05-18 NOTE — Assessment & Plan Note (Signed)
Quit smoking 03/2023, congratulated. Continues vaping some.

## 2023-05-18 NOTE — Telephone Encounter (Signed)
Pt scheduled OV today with Dr Reece Agar at 12:00. Need to offer virtual visit with Dr Ermalene Searing today.  Attempted to contact pt. No answer. Vm box full. I will send MyChart message.

## 2023-05-18 NOTE — Patient Instructions (Addendum)
You have a viral upper respiratory infection. Antibiotics are not needed for this. Viral infections usually take 7-10 days to resolve.  A post-viral cough can last a few weeks to go away. Push fluids and plenty of rest. Please return if you are not improving as expected, or if you have high fevers (>101.5) or difficulty swallowing or worsening productive cough.  Call clinic with questions.   Good to see you today. I hope you start feeling better soon.

## 2023-05-18 NOTE — Progress Notes (Signed)
Ph: [phone] Fax: [phone]   Patient ID: Robyn Ortiz, female    DOB: 1985/08/23, 37 y.o.   MRN: [id number]  This visit was conducted in person.  BP 120/78   Pulse 63   Temp 97.8 F (36.6 C) (Temporal)   Ht 5\' 5"  (1.651 m)   Wt 185 lb 2 oz (84 kg)   LMP 05/18/2023   SpO2 98%   BMI 30.81 kg/m    CC: ST, body aches, headache  Subjective:   HPI: Robyn Ortiz is a 37 y.o. female presenting on 05/18/2023 for Sore Throat (C/o ST, HA, body aches and diarrhea. Sxs started 05/17/23. )   1d h/o headache, GI upset with diarrhea. Symptoms started with a migraine. This morning developed ST, PNdrainage, body aches. Some chills. Some loss of taste/smell.   No fevers, ear or tooth pain, cough, wheezing.  Treating with mucinex DM, midol.   No sick contacts at home.  Quit smoking cigarettes 03/2023, now just vaping. H/o asthma.   COVID vaccines - none      Relevant past medical, surgical, family and social history reviewed and updated as indicated. Interim medical history since our last visit reviewed. Allergies and medications reviewed and updated. Outpatient Medications Prior to Visit  Medication Sig Dispense Refill   acetaminophen (TYLENOL) 325 MG tablet Take 650 mg by mouth every 6 (six) hours as needed for moderate pain.     AIMOVIG 140 MG/ML SOAJ SMARTSIG:140 Milligram(s) SUB-Q Every 4 Weeks 1 mL 5   cetirizine (ZYRTEC) 10 MG tablet Take 10 mg by mouth daily.  5   cholestyramine (QUESTRAN) 4 g packet TAKE 1 PACKET BY MOUTH 2 TIMES DAILY. 60 packet 2   montelukast (SINGULAIR) 10 MG tablet Take 10 mg by mouth daily.     PROAIR HFA 108 (90 Base) MCG/ACT inhaler Inhale 2 puffs into the lungs every 6 (six) hours as needed for wheezing or shortness of breath.     promethazine (PHENERGAN) 25 MG tablet Take 1 tablet (25 mg total) by mouth every 6 (six) hours as needed for nausea or vomiting. 5 tablet 0   rizatriptan (MAXALT) 5 MG tablet Take 1 tablet (5 mg total) by mouth as  needed for migraine. May repeat in 2 hours if needed 10 tablet 0   Semaglutide-Weight Management (WEGOVY) 0.5 MG/0.5ML SOAJ After four weeks of 0.25 mg qweek, increase to 0.5 mg qweek 2 mL 2   No facility-administered medications prior to visit.     Per HPI unless specifically indicated in ROS section below Review of Systems  Objective:  BP 120/78   Pulse 63   Temp 97.8 F (36.6 C) (Temporal)   Ht 5\' 5"  (1.651 m)   Wt 185 lb 2 oz (84 kg)   LMP 05/18/2023   SpO2 98%   BMI 30.81 kg/m   Wt Readings from Last 3 Encounters:  05/18/23 185 lb 2 oz (84 kg)  05/12/23 187 lb 8 oz (85 kg)  04/22/23 185 lb (83.9 kg)      Physical Exam Vitals and nursing note reviewed.  Constitutional:      Appearance: Normal appearance. She is not ill-appearing.  HENT:     Head: Normocephalic and atraumatic.     Right Ear: Tympanic membrane, ear canal and external ear normal. There is no impacted cerumen.     Left Ear: Tympanic membrane, ear canal and external ear normal. There is no impacted cerumen.     Nose: Congestion and rhinorrhea present.  Mouth/Throat:     Mouth: Mucous membranes are moist. Oral lesions present.     Pharynx: Oropharynx is clear. No oropharyngeal exudate or posterior oropharyngeal erythema.     Comments: Small sore to posterior R oropharynx Eyes:     Extraocular Movements: Extraocular movements intact.     Conjunctiva/sclera: Conjunctivae normal.     Pupils: Pupils are equal, round, and reactive to light.  Cardiovascular:     Rate and Rhythm: Normal rate and regular rhythm.     Pulses: Normal pulses.     Heart sounds: Normal heart sounds. No murmur heard. Pulmonary:     Effort: Pulmonary effort is normal. No respiratory distress.     Breath sounds: Normal breath sounds. No wheezing, rhonchi or rales.  Musculoskeletal:     Cervical back: Normal range of motion and neck supple.  Lymphadenopathy:     Head:     Right side of head: No submental, submandibular,  tonsillar, preauricular or posterior auricular adenopathy.     Left side of head: No submental, submandibular, tonsillar, preauricular or posterior auricular adenopathy.     Cervical: No cervical adenopathy.     Right cervical: No superficial cervical adenopathy.    Left cervical: No superficial cervical adenopathy.     Upper Body:     Right upper body: No supraclavicular adenopathy.     Left upper body: No supraclavicular adenopathy.  Skin:    Findings: No rash.  Neurological:     Mental Status: She is alert.  Psychiatric:        Mood and Affect: Mood normal.        Behavior: Behavior normal.       Results for orders placed or performed in visit on 05/18/23  POC COVID-19 BinaxNow  Result Value Ref Range   SARS Coronavirus 2 Ag Negative Negative  POCT Influenza A/B  Result Value Ref Range   Influenza A, POC Negative Negative   Influenza B, POC Negative Negative  POCT rapid strep A  Result Value Ref Range   Rapid Strep A Screen Negative Negative    Assessment & Plan:   Problem List Items Addressed This Visit     Asthma    Lungs clear. No current asthma exacerbation. Continue PRN albuterol.       Ex-smoker    Quit smoking 03/2023, congratulated. Continues vaping some.       Viral URI - Primary    Anticipate viral upper respiratory infection given short duration.  Supportive measures encouraged as per instructions. Update if not improving with treatment as expected.  Red flags to seek further care reviewed.       Other Visit Diagnoses     Sore throat       Relevant Orders   POCT rapid strep A (Completed)   Body aches       Relevant Orders   POC COVID-19 BinaxNow (Completed)   POCT Influenza A/B (Completed)        No orders of the defined types were placed in this encounter.   Orders Placed This Encounter  Procedures   POC COVID-19 BinaxNow    Order Specific Question:   Previously tested for COVID-19    Answer:   Yes    Order Specific Question:    Resident in a congregate (group) care setting    Answer:   No    Order Specific Question:   Employed in healthcare setting    Answer:   No    Order Specific Question:  Pregnant    Answer:   No   POCT Influenza A/B   POCT rapid strep A    Patient Instructions  You have a viral upper respiratory infection. Antibiotics are not needed for this. Viral infections usually take 7-10 days to resolve.  A post-viral cough can last a few weeks to go away. Push fluids and plenty of rest. Please return if you are not improving as expected, or if you have high fevers (>101.5) or difficulty swallowing or worsening productive cough.  Call clinic with questions.   Good to see you today. I hope you start feeling better soon.   Follow up plan: Return if symptoms worsen or fail to improve.  Eustaquio Boyden, MD

## 2023-05-20 ENCOUNTER — Other Ambulatory Visit: Payer: Self-pay | Admitting: Gastroenterology

## 2023-05-20 DIAGNOSIS — K9089 Other intestinal malabsorption: Secondary | ICD-10-CM

## 2023-05-23 ENCOUNTER — Encounter: Payer: Self-pay | Admitting: Family

## 2023-05-23 ENCOUNTER — Ambulatory Visit
Admission: EM | Admit: 2023-05-23 | Discharge: 2023-05-23 | Disposition: A | Discharge status: Home / Self Care | Payer: Medicaid Other | Attending: Emergency Medicine | Admitting: Emergency Medicine

## 2023-05-23 DIAGNOSIS — H6691 Otitis media, unspecified, right ear: Secondary | ICD-10-CM | POA: Diagnosis not present

## 2023-05-23 DIAGNOSIS — J0100 Acute maxillary sinusitis, unspecified: Secondary | ICD-10-CM | POA: Diagnosis not present

## 2023-05-23 MED ORDER — AZITHROMYCIN 250 MG PO TABS: 250.0000 mg | ORAL_TABLET | Freq: Every day | ORAL | 0 refills | Status: DC

## 2023-05-23 NOTE — ED Triage Notes (Signed)
Patient to Urgent Care with complaints of right sided ear pain/ aching. No drainage.   Symptoms started today. URI since Tuesday.   Taking tylenol/ ibuprofen/ Mucinex-DM.

## 2023-05-23 NOTE — Discharge Instructions (Addendum)
Take the Zithromax as directed.  Follow up with your primary care provider if your symptoms are not improving.    

## 2023-05-23 NOTE — ED Provider Notes (Signed)
Robyn Ortiz    CSN: [id number] Arrival date & time: 05/23/23  1403      History   Chief Complaint Chief Complaint  Patient presents with   Otalgia    HPI Robyn Ortiz is a 37 y.o. female.  Patient presents with right ear pain today.  She has had congestion and cough x 5 days.  No fever, rash, wheezing, shortness of breath, or other symptoms.  Treating with Mucinex.  Her medical history includes asthma, IBS, migraine headache, GERD.  The history is provided by the patient and medical records.    Past Medical History:  Diagnosis Date   Diverticulitis    GERD (gastroesophageal reflux disease)    History of bleeding ulcers    told not to take ibuprofen   History of bronchitis 10yrs ago   Migraine    Obesity    Sleep apnea    CPAP    Patient Active Problem List   Diagnosis Date Noted   Viral URI 04/22/2023   Exposure to COVID-19 virus 04/21/2023   Class 1 obesity due to excess calories without serious comorbidity with body mass index (BMI) of 30.0 to 30.9 in adult 03/26/2023   Irritable bowel syndrome with diarrhea 02/11/2023   Vapes nicotine containing substance 02/11/2023   GAD (generalized anxiety disorder) 02/11/2023   Eczema 09/04/2014   Asthma 11/21/2012   Ex-smoker 11/21/2012   Condyloma acuminatum of perianal region 11/21/2012   Migraine 11/21/2012    Past Surgical History:  Procedure Laterality Date   CESAREAN SECTION N/A 04/08/2015   Procedure: CESAREAN SECTION;  Surgeon: Catalina Antigua, MD;  Location: [facility];  Service: Obstetrics;  Laterality: N/A;   CESAREAN SECTION WITH BILATERAL TUBAL LIGATION  04/08/2015   CHOLECYSTECTOMY  03/27/2014   Dr Lemar Livings   COLONOSCOPY WITH PROPOFOL N/A 12/18/2021   Procedure: COLONOSCOPY WITH PROPOFOL;  Surgeon: Toney Reil, MD;  Location: [facility] ENDOSCOPY;  Service: Gastroenterology;  Laterality: N/A;   ESOPHAGOGASTRODUODENOSCOPY N/A 05/19/2017   Procedure: ESOPHAGOGASTRODUODENOSCOPY (EGD);  Surgeon:  Toney Reil, MD;  Location: [facility];  Service: Endoscopy;  Laterality: N/A;   ESOPHAGOGASTRODUODENOSCOPY (EGD) WITH PROPOFOL N/A 12/18/2021   Procedure: ESOPHAGOGASTRODUODENOSCOPY (EGD) WITH PROPOFOL;  Surgeon: Toney Reil, MD;  Location: [facility] ENDOSCOPY;  Service: Gastroenterology;  Laterality: N/A;   GRADUATED/TOLERANCE TEST  03/06/2022   Good exercise tolerance-10:24 min.  Reached peak heart rate of 169 bpm equals 91% MPHR at 186 bpm.  No EKG changes, chest pain or dyspnea.   TONSILLECTOMY  03/10/2012   Procedure: TONSILLECTOMY;  Surgeon: Suzanna Obey, MD;  Location: [facility] OR;  Service: ENT;  Laterality: Bilateral;    OB History     Gravida  3   Para  3   Term  3   Preterm      AB      Living  3      SAB      IAB      Ectopic      Multiple  0   Live Births  3        Obstetric Comments  1st Menstrual Cycle:  14  1st Pregnancy:  17          Home Medications    Prior to Admission medications   Medication Sig Start Date End Date Taking? Authorizing Provider  azithromycin (ZITHROMAX) 250 MG tablet Take 1 tablet (250 mg total) by mouth daily. Take first 2 tablets together, then 1 every day until finished. 05/23/23  Yes  Mickie Bail, NP  acetaminophen (TYLENOL) 325 MG tablet Take 650 mg by mouth every 6 (six) hours as needed for moderate pain.    [provider]  AIMOVIG 140 MG/ML SOAJ SMARTSIG:140 Milligram(s) SUB-Q Every 4 Weeks 03/26/23   Mort Sawyers, FNP  cetirizine (ZYRTEC) 10 MG tablet Take 10 mg by mouth daily. 04/04/17   [provider]  cholestyramine (QUESTRAN) 4 g packet TAKE 1 PACKET BY MOUTH 2 TIMES DAILY. 05/20/23   Toney Reil, MD  montelukast (SINGULAIR) 10 MG tablet Take 10 mg by mouth daily. 04/19/23   [provider]  PROAIR HFA 108 (90 Base) MCG/ACT inhaler Inhale 2 puffs into the lungs every 6 (six) hours as needed for wheezing or shortness of breath. 02/11/23   Mort Sawyers, FNP   promethazine (PHENERGAN) 25 MG tablet Take 1 tablet (25 mg total) by mouth every 6 (six) hours as needed for nausea or vomiting. 12/26/22   Immordino, Jeannett Senior, FNP  rizatriptan (MAXALT) 5 MG tablet Take 1 tablet (5 mg total) by mouth as needed for migraine. May repeat in 2 hours if needed 03/26/23   Mort Sawyers, FNP  Semaglutide-Weight Management [facility]) 0.5 MG/0.5ML SOAJ After four weeks of 0.25 mg qweek, increase to 0.5 mg qweek 03/26/23   Mort Sawyers, FNP    Family History Family History  Problem Relation Age of Onset   Bipolar disorder Mother    Schizophrenia Mother    Diabetes Father    Heart disease Sister    Suicidality Brother    Heart disease Maternal Grandmother    Alcohol abuse Maternal Grandfather    Gastric cancer Paternal Grandfather    Arthritis Paternal Grandfather     Social History Social History   Tobacco Use   Smoking status: Every Day    Current packs/day: 1.00    Average packs/day: 1 pack/day for 13.0 years (13.0 ttl pk-yrs)    Types: Cigarettes   Smokeless tobacco: Never  Vaping Use   Vaping status: Never Used  Substance Use Topics   Alcohol use: Yes    Comment: beer occasionally   Drug use: No     Allergies   Codeine and Latex   Review of Systems Review of Systems  Constitutional:  Negative for chills and fever.  HENT:  Positive for congestion, ear pain and sore throat. Negative for ear discharge.   Respiratory:  Positive for cough. Negative for shortness of breath and wheezing.   Cardiovascular:  Negative for chest pain and palpitations.     Physical Exam Triage Vital Signs ED Triage Vitals  Encounter Vitals Group     BP 05/23/23 1538 120/77     Systolic BP Percentile --      Diastolic BP Percentile --      Pulse --      Resp --      Temp --      Temp src --      SpO2 --      Weight --      Height --      Head Circumference --      Peak Flow --      Pain Score 05/23/23 1531 9     Pain Loc --      Pain Education --       Exclude from Growth Chart --    No data found.  Updated Vital Signs BP 120/77   Pulse 66   Temp 97.7 F (36.5 C)   Resp 18  LMP 05/18/2023   SpO2 98%   Visual Acuity Right Eye Distance:   Left Eye Distance:   Bilateral Distance:    Right Eye Near:   Left Eye Near:    Bilateral Near:     Physical Exam Constitutional:      General: She is not in acute distress. HENT:     Right Ear: Tympanic membrane is erythematous.     Left Ear: Tympanic membrane normal.     Nose: Congestion present.     Mouth/Throat:     Mouth: Mucous membranes are moist.     Pharynx: Oropharynx is clear.  Cardiovascular:     Rate and Rhythm: Normal rate and regular rhythm.     Heart sounds: Normal heart sounds.  Pulmonary:     Effort: Pulmonary effort is normal. No respiratory distress.     Breath sounds: Normal breath sounds.  Skin:    General: Skin is warm and dry.  Neurological:     Mental Status: She is alert.  Psychiatric:        Mood and Affect: Mood normal.        Behavior: Behavior normal.      UC Treatments / Results  Labs (all labs ordered are listed, but only abnormal results are displayed) Labs Reviewed - No data to display  EKG   Radiology No results found.  Procedures Procedures (including critical care time)  Medications Ordered in UC Medications - No data to display  Initial Impression / Assessment and Plan / UC Course  I have reviewed the triage vital signs and the nursing notes.  Pertinent labs & imaging results that were available during my care of the patient were reviewed by me and considered in my medical decision making (see chart for details).    Right otitis media, acute sinusitis.  Treating with Zithromax as patient reports this works well for her.  Tylenol or ibuprofen as needed.  Instructed her to follow-up with her PCP if she is not improving.  She agrees to plan of care.  Final Clinical Impressions(s) / UC Diagnoses   Final diagnoses:  Right  otitis media, unspecified otitis media type  Acute non-recurrent maxillary sinusitis     Discharge Instructions      Take the Zithromax as directed.  Follow-up with your primary care provider if your symptoms are not improving.      ED Prescriptions     Medication Sig Dispense Auth. Provider   azithromycin (ZITHROMAX) 250 MG tablet Take 1 tablet (250 mg total) by mouth daily. Take first 2 tablets together, then 1 every day until finished. 6 tablet Mickie Bail, NP      PDMP not reviewed this encounter.   Mickie Bail, NP 05/23/23 [phone]

## 2023-05-24 ENCOUNTER — Other Ambulatory Visit (HOSPITAL_COMMUNITY): Payer: Self-pay

## 2023-05-27 ENCOUNTER — Encounter: Payer: Self-pay | Admitting: Family

## 2023-05-27 ENCOUNTER — Telehealth: Payer: Self-pay

## 2023-05-27 ENCOUNTER — Other Ambulatory Visit (HOSPITAL_COMMUNITY): Payer: Self-pay

## 2023-05-27 NOTE — Telephone Encounter (Signed)
Pharmacy Patient Advocate Encounter   Received notification from Patient Advice Request messages that prior authorization for Wegovy 0.5MG /0.5ML auto-injectors is required/requested.   Insurance verification completed.   The patient is insured through [facility] [place] .   Per test claim: PA required; PA submitted to [facility] [place] Medicaid via CoverMyMeds Key/confirmation #/EOC [id number] Status is pending

## 2023-05-28 ENCOUNTER — Other Ambulatory Visit: Payer: Self-pay | Admitting: Family

## 2023-05-28 ENCOUNTER — Other Ambulatory Visit (HOSPITAL_COMMUNITY): Payer: Self-pay

## 2023-05-28 DIAGNOSIS — E66811 Obesity, class 1: Secondary | ICD-10-CM

## 2023-05-28 NOTE — Telephone Encounter (Signed)
Pharmacy Patient Advocate Encounter  Received notification from [facility] Medicaid that Prior Authorization for [facility] 0.5MG /0.5ML auto-injectors has been APPROVED from 05/27/23 to 11/23/23   PA #/Case ID/Reference #:  [id number]

## 2023-05-28 NOTE — Telephone Encounter (Signed)
PA has already been approved in separate encounter, pt should be able to fill at her preferred pharmacy for $4.00

## 2023-06-01 ENCOUNTER — Telehealth: Payer: Self-pay | Admitting: Pharmacy Technician

## 2023-06-01 NOTE — Telephone Encounter (Signed)
Pharmacy Patient Advocate Encounter   Received notification from CoverMyMeds that prior authorization for Aimovig 140MG /ML auto-injectors is required/requested.   Insurance verification completed.   The patient is insured through [facility] [place] .   Per test claim: PA required; PA submitted to [facility] Medicaid via CoverMyMeds Key/confirmation #/EOC [id number] Status is pending

## 2023-06-04 NOTE — Telephone Encounter (Signed)
Pharmacy Patient Advocate Encounter  Received notification from [facility] that Prior Authorization for Aimovig has been APPROVED from 05/18/2023 to 05/31/2024   PA #/Case ID/Reference #: [id number]

## 2023-06-22 ENCOUNTER — Other Ambulatory Visit: Payer: Self-pay | Admitting: Family

## 2023-06-22 ENCOUNTER — Encounter: Payer: Self-pay | Admitting: Family

## 2023-06-22 DIAGNOSIS — G43101 Migraine with aura, not intractable, with status migrainosus: Secondary | ICD-10-CM

## 2023-06-22 DIAGNOSIS — F411 Generalized anxiety disorder: Secondary | ICD-10-CM

## 2023-06-24 NOTE — Telephone Encounter (Signed)
Spoke with patient and she is not taking lexapro. Stated that it must have been an automated request from [facility]. She did also ask if her Maxalt could be refilled while speaking with her?

## 2023-06-25 MED ORDER — RIZATRIPTAN BENZOATE 5 MG PO TABS
5.0000 mg | ORAL_TABLET | ORAL | 0 refills | Status: DC | PRN
Start: 2023-06-25 — End: 2023-07-23

## 2023-06-25 NOTE — Telephone Encounter (Signed)
Sent in maxalt refill.

## 2023-07-15 ENCOUNTER — Emergency Department (HOSPITAL_COMMUNITY)
Admission: EM | Admit: 2023-07-15 | Discharge: 2023-07-15 | Disposition: A | Discharge status: Home / Self Care | Payer: Medicaid Other

## 2023-07-15 ENCOUNTER — Emergency Department (HOSPITAL_COMMUNITY): Payer: Medicaid Other

## 2023-07-15 ENCOUNTER — Encounter (HOSPITAL_COMMUNITY): Payer: Self-pay

## 2023-07-15 ENCOUNTER — Other Ambulatory Visit: Payer: Self-pay

## 2023-07-15 DIAGNOSIS — Z91040 Latex allergy status: Secondary | ICD-10-CM | POA: Diagnosis not present

## 2023-07-15 DIAGNOSIS — B349 Viral infection, unspecified: Secondary | ICD-10-CM | POA: Diagnosis not present

## 2023-07-15 DIAGNOSIS — M791 Myalgia, unspecified site: Secondary | ICD-10-CM | POA: Diagnosis present

## 2023-07-15 DIAGNOSIS — Z1152 Encounter for screening for COVID-19: Secondary | ICD-10-CM | POA: Diagnosis not present

## 2023-07-15 LAB — RESP PANEL BY RT-PCR (RSV, FLU A&B, COVID)  RVPGX2
Influenza A by PCR: NEGATIVE
Influenza B by PCR: NEGATIVE
Resp Syncytial Virus by PCR: NEGATIVE
SARS Coronavirus 2 by RT PCR: NEGATIVE

## 2023-07-15 MED ORDER — IBUPROFEN 200 MG PO TABS
600.0000 mg | ORAL_TABLET | Freq: Once | ORAL | Status: AC
  Administered 2023-07-15: 600 mg via ORAL
  Filled 2023-07-15: qty 3

## 2023-07-15 MED ORDER — ONDANSETRON HCL 4 MG PO TABS: 4.0000 mg | ORAL_TABLET | Freq: Four times a day (QID) | ORAL | 0 refills | Status: AC

## 2023-07-15 MED ORDER — BENZONATATE 100 MG PO CAPS: 100.0000 mg | ORAL_CAPSULE | Freq: Three times a day (TID) | ORAL | 0 refills | Status: DC

## 2023-07-15 MED ORDER — ONDANSETRON 4 MG PO TBDP
4.0000 mg | ORAL_TABLET | Freq: Once | ORAL | Status: AC
  Administered 2023-07-15: 4 mg via ORAL
  Filled 2023-07-15: qty 1

## 2023-07-15 NOTE — Discharge Instructions (Addendum)
Your flu/COVID/RSV test were negative.  As discussed continue supportive care with Tylenol, Motrin and rest.  We are prescribing you medications for nausea and cough.  Please maintain adequate hydration.  Follow-up with your primary doctor.  Return immediately if develop fevers, chest pain, worsening shortness of breath, inability eat or drink due to nausea vomiting.  Severe pain or any new or worsening symptoms that are concerning to you.

## 2023-07-15 NOTE — ED Provider Notes (Signed)
EMERGENCY DEPARTMENT AT [facility] Provider Note   CSN: [id number] Arrival date & time: 07/15/23  2206     History  Chief Complaint  Patient presents with   Generalized Body Aches    Robyn Ortiz is a 37 y.o. female.  37 year old female presenting emergency department with flulike symptoms.  Complains of symptoms since yesterday.  Acute onset with generalized malaise, myalgia.  Cough, sore throat.  Subjective fevers and chills at home.  Nausea, no vomiting.  She has had some right-sided rib pain as well.        Home Medications Prior to Admission medications   Medication Sig Start Date End Date Taking? Authorizing Provider  acetaminophen (TYLENOL) 325 MG tablet Take 650 mg by mouth every 6 (six) hours as needed for moderate pain.    [provider]  AIMOVIG 140 MG/ML SOAJ SMARTSIG:140 Milligram(s) SUB-Q Every 4 Weeks 03/26/23   Mort Sawyers, FNP  cetirizine (ZYRTEC) 10 MG tablet Take 10 mg by mouth daily. 04/04/17   [provider]  cholestyramine (QUESTRAN) 4 g packet TAKE 1 PACKET BY MOUTH 2 TIMES DAILY. 05/20/23   Toney Reil, MD  montelukast (SINGULAIR) 10 MG tablet Take 10 mg by mouth daily. 04/19/23   [provider]  PROAIR HFA 108 (90 Base) MCG/ACT inhaler Inhale 2 puffs into the lungs every 6 (six) hours as needed for wheezing or shortness of breath. 02/11/23   Mort Sawyers, FNP  promethazine (PHENERGAN) 25 MG tablet Take 1 tablet (25 mg total) by mouth every 6 (six) hours as needed for nausea or vomiting. 12/26/22   Immordino, Jeannett Senior, FNP  rizatriptan (MAXALT) 5 MG tablet Take 1 tablet (5 mg total) by mouth as needed for migraine. May repeat in 2 hours if needed 06/25/23   Mort Sawyers, FNP  Semaglutide-Weight Management [facility]) 0.5 MG/0.5ML SOAJ After four weeks of 0.25 mg qweek, increase to 0.5 mg qweek 03/26/23   Mort Sawyers, FNP      Allergies    Codeine and Latex    Review of Systems   Review of  Systems  Physical Exam Updated Vital Signs BP 116/72   Pulse 82   Temp 98.2 F (36.8 C) (Oral)   Resp 18   Ht 5\' 5"  (1.651 m)   Wt 84 kg   LMP 07/08/2023   SpO2 97%   BMI 30.82 kg/m  Physical Exam Vitals and nursing note reviewed.  Constitutional:      General: She is not in acute distress.    Appearance: She is not toxic-appearing.  HENT:     Head: Normocephalic.     Nose: Nose normal.     Mouth/Throat:     Mouth: Mucous membranes are moist.     Pharynx: No oropharyngeal exudate or posterior oropharyngeal erythema.  Eyes:     Extraocular Movements: Extraocular movements intact.     Conjunctiva/sclera: Conjunctivae normal.  Cardiovascular:     Rate and Rhythm: Normal rate and regular rhythm.  Pulmonary:     Effort: Pulmonary effort is normal.     Breath sounds: Normal breath sounds.  Abdominal:     General: Abdomen is flat. There is no distension.     Palpations: Abdomen is soft.     Tenderness: There is no abdominal tenderness. There is no guarding or rebound.  Musculoskeletal:        General: Normal range of motion.  Skin:    General: Skin is warm and dry.  Capillary Refill: Capillary refill takes less than 2 seconds.  Neurological:     Mental Status: She is alert and oriented to person, place, and time.  Psychiatric:        Mood and Affect: Mood normal.     ED Results / Procedures / Treatments   Labs (all labs ordered are listed, but only abnormal results are displayed) Labs Reviewed  RESP PANEL BY RT-PCR (RSV, FLU A&B, COVID)  [id number]    EKG None  Radiology DG Chest 2 View  Result Date: 07/15/2023 CLINICAL DATA:  Cough for 1 week EXAM: CHEST - 2 VIEW COMPARISON:  02/05/2022 FINDINGS: The heart size and mediastinal contours are within normal limits. Both lungs are clear. The visualized skeletal structures are unremarkable. IMPRESSION: No active cardiopulmonary disease. Electronically Signed   By: Jasmine Pang M.D.   On: 07/15/2023 22:39     Procedures Procedures    Medications Ordered in ED Medications  ondansetron (ZOFRAN-ODT) disintegrating tablet 4 mg (4 mg Oral Given 07/15/23 2302)  ibuprofen (ADVIL) tablet 600 mg (600 mg Oral Given 07/15/23 2302)    ED Course/ Medical Decision Making/ A&P Clinical Course as of 07/15/23 2313  Thu Jul 15, 2023  2248 DG Chest 2 View IMPRESSION: No active cardiopulmonary disease.   [TY]  2310 SARS Coronavirus 2 by RT PCR: NEGATIVE [TY]  2310 Influenza A By PCR: NEGATIVE [TY]  2310 Respiratory Syncytial Virus by PCR: NEGATIVE [TY]    Clinical Course User Index [TY] Coral Spikes, DO                                 Medical Decision Making 37 year old female present emergency department with viral URI symptoms.  Description concern for possible viral etiology such as flu or COVID.  However flu/COVID/RSV negative.  Given his cough and right-sided rib pain concern for possible pneumonia.  However chest x-ray negative.  Patient clinically well-appearing.  SHe is tolerating p.o.  Clinically does not appear to be dehydrated.  Suspect viral etiology.  Will forego labs at this time as I have low suspicion for acute systemic infection or metabolic derangements.  Discussed supportive care.  Will discharge with medications.  Amount and/or Complexity of Data Reviewed Labs:  Decision-making details documented in ED Course. Radiology: ordered. Decision-making details documented in ED Course.  Risk OTC drugs. Prescription drug management.          Final Clinical Impression(s) / ED Diagnoses Final diagnoses:  None    Rx / DC Orders ED Discharge Orders     None         Coral Spikes, DO 07/15/23 2313

## 2023-07-15 NOTE — ED Triage Notes (Signed)
Pt reports with generalized body aches, right rib pain, cough, and sore throat since yesterday. Pt states that she feels like she has been hit by a truck.

## 2023-07-19 ENCOUNTER — Encounter: Payer: Self-pay | Admitting: Family

## 2023-07-19 NOTE — Telephone Encounter (Signed)
Have scheduled patient

## 2023-07-19 NOTE — Telephone Encounter (Signed)
Yes please

## 2023-07-23 ENCOUNTER — Ambulatory Visit
Admission: RE | Admit: 2023-07-23 | Discharge: 2023-07-23 | Disposition: A | Discharge status: Home / Self Care | Payer: Medicaid Other | Source: Ambulatory Visit | Attending: Family | Admitting: Family

## 2023-07-23 ENCOUNTER — Encounter: Payer: Self-pay | Admitting: Family

## 2023-07-23 ENCOUNTER — Other Ambulatory Visit: Payer: Self-pay | Admitting: Family

## 2023-07-23 ENCOUNTER — Ambulatory Visit (INDEPENDENT_AMBULATORY_CARE_PROVIDER_SITE_OTHER): Payer: Medicaid Other | Admitting: Family

## 2023-07-23 VITALS — BP 112/70 | HR 78 | Temp 98.0°F | Ht 65.0 in | Wt 189.0 lb

## 2023-07-23 DIAGNOSIS — R519 Headache, unspecified: Secondary | ICD-10-CM | POA: Diagnosis not present

## 2023-07-23 DIAGNOSIS — J029 Acute pharyngitis, unspecified: Secondary | ICD-10-CM | POA: Diagnosis not present

## 2023-07-23 DIAGNOSIS — R1031 Right lower quadrant pain: Secondary | ICD-10-CM | POA: Insufficient documentation

## 2023-07-23 DIAGNOSIS — R1011 Right upper quadrant pain: Secondary | ICD-10-CM | POA: Insufficient documentation

## 2023-07-23 DIAGNOSIS — G43101 Migraine with aura, not intractable, with status migrainosus: Secondary | ICD-10-CM

## 2023-07-23 DIAGNOSIS — J0110 Acute frontal sinusitis, unspecified: Secondary | ICD-10-CM | POA: Insufficient documentation

## 2023-07-23 DIAGNOSIS — E538 Deficiency of other specified B group vitamins: Secondary | ICD-10-CM | POA: Diagnosis not present

## 2023-07-23 DIAGNOSIS — R040 Epistaxis: Secondary | ICD-10-CM | POA: Insufficient documentation

## 2023-07-23 DIAGNOSIS — R110 Nausea: Secondary | ICD-10-CM

## 2023-07-23 DIAGNOSIS — G43111 Migraine with aura, intractable, with status migrainosus: Secondary | ICD-10-CM

## 2023-07-23 LAB — CBC WITH DIFFERENTIAL/PLATELET
Basophils Absolute: 0 10*3/uL (ref 0.0–0.1)
Basophils Relative: 0.4 % (ref 0.0–3.0)
Eosinophils Absolute: 0.1 10*3/uL (ref 0.0–0.7)
Eosinophils Relative: 1.2 % (ref 0.0–5.0)
HCT: 38.8 % (ref 36.0–46.0)
Hemoglobin: 13 g/dL (ref 12.0–15.0)
Lymphocytes Relative: 24.5 % (ref 12.0–46.0)
Lymphs Abs: 1.9 10*3/uL (ref 0.7–4.0)
MCHC: 33.6 g/dL (ref 30.0–36.0)
MCV: 91.9 fL (ref 78.0–100.0)
Monocytes Absolute: 0.4 10*3/uL (ref 0.1–1.0)
Monocytes Relative: 5 % (ref 3.0–12.0)
Neutro Abs: 5.4 10*3/uL (ref 1.4–7.7)
Neutrophils Relative %: 68.9 % (ref 43.0–77.0)
Platelets: 335 10*3/uL (ref 150.0–400.0)
RBC: 4.23 Mil/uL (ref 3.87–5.11)
RDW: 12.6 % (ref 11.5–15.5)
WBC: 7.9 10*3/uL (ref 4.0–10.5)

## 2023-07-23 LAB — COMPREHENSIVE METABOLIC PANEL
ALT: 41 U/L — ABNORMAL HIGH (ref 0–35)
AST: 19 U/L (ref 0–37)
Albumin: 4.1 g/dL (ref 3.5–5.2)
Alkaline Phosphatase: 44 U/L (ref 39–117)
BUN: 11 mg/dL (ref 6–23)
CO2: 24 meq/L (ref 19–32)
Calcium: 8.6 mg/dL (ref 8.4–10.5)
Chloride: 110 meq/L (ref 96–112)
Creatinine, Ser: 0.55 mg/dL (ref 0.40–1.20)
GFR: 117.94 mL/min (ref >60.00)
Glucose, Bld: 81 mg/dL (ref 70–99)
Potassium: 3.5 meq/L (ref 3.5–5.1)
Sodium: 140 meq/L (ref 135–145)
Total Bilirubin: 0.3 mg/dL (ref 0.2–1.2)
Total Protein: 7.1 g/dL (ref 6.0–8.3)

## 2023-07-23 LAB — C-REACTIVE PROTEIN: CRP: <1 mg/dL (ref 0.5–20.0)

## 2023-07-23 LAB — VITAMIN B12: Vitamin B-12: 516 pg/mL (ref 211–911)

## 2023-07-23 LAB — POCT RAPID STREP A (OFFICE): Rapid Strep A Screen: NEGATIVE

## 2023-07-23 LAB — SEDIMENTATION RATE: Sed Rate: 11 mm/h (ref 0–20)

## 2023-07-23 MED ORDER — PREDNISONE 10 MG (21) PO TBPK: ORAL_TABLET | ORAL | 0 refills | Status: DC

## 2023-07-23 MED ORDER — IOHEXOL 300 MG/ML  SOLN
100.0000 mL | Freq: Once | INTRAMUSCULAR | Status: AC | PRN
  Administered 2023-07-23: 100 mL via INTRAVENOUS

## 2023-07-23 MED ORDER — AMOXICILLIN-POT CLAVULANATE 875-125 MG PO TABS
1.0000 | ORAL_TABLET | Freq: Two times a day (BID) | ORAL | 0 refills | Status: DC
Start: 2023-07-23 — End: 2023-08-13

## 2023-07-23 MED ORDER — MUPIROCIN 2 % EX OINT: 1.0000 | TOPICAL_OINTMENT | Freq: Two times a day (BID) | CUTANEOUS | 0 refills | Status: DC

## 2023-07-23 NOTE — Assessment & Plan Note (Signed)
Suspect sinus driven will treat for sinusitis

## 2023-07-23 NOTE — Assessment & Plan Note (Signed)
Active in office.  Cauterized with silver nitrate.  Pt advised to use nasal saline x 4 a day and apply mupirocin ointment. Also can use aquaphor at night time.

## 2023-07-23 NOTE — Assessment & Plan Note (Signed)
Multiple treatment failures,  Referral to neurology for eval/treat  Sed rate and crp ordered  Tsh reviewed from previous, normal .  Given conservative recommendations as well.

## 2023-07-23 NOTE — Assessment & Plan Note (Signed)
Prescription given for augmentin 875/125 mg po bid for ten days and prednisone, however pt advised to wait on this regimen pending results of CT. Pt to continue tylenol/ibuprofen prn sinus pain. Continue with humidifier prn and steam showers recommended as well. instructed If no symptom improvement in 48 hours please f/u

## 2023-07-23 NOTE — Patient Instructions (Addendum)
  apply over-the-counter saline spray to your nose at least four times daily, and you can use it more frequently if needed. Additionally, apply a small amount of Vaseline or Aquaphor to the affected area using a Q-tip or clean fingertip for the next month, especially before sleeping. The use of a humidifier in the bedroom is recommended to maintain moisture and prevent further nosebleeds.  A referral was placed today for neurology  Please let us know if you have not heard back within 2 weeks about the referral.  ------------------------------------   MIGRAINE RECOMMENDATIONS TO CONSIDER  1. Limit use of pain relievers to no more than 2 days out of the week.  These medications include acetaminophen, NSAIDs (ibuprofen/Advil/Motrin, naproxen/Aleve, triptans (Imitrex/sumatriptan), Excedrin, and narcotics.  This will help reduce risk of rebound headaches. 2. Be aware of common food triggers:             - Caffeine:  coffee, black tea, cola, Mt. Dew             - Chocolate             - Dairy:  aged cheeses (brie, blue, cheddar, gouda, [place], provolone, [place], Swiss, etc), chocolate milk, buttermilk, sour cream, limit eggs and yogurt             - Nuts, peanut butter             - Alcohol             - Cereals/grains:  FRESH breads (fresh bagels, sourdough, doughnuts), yeast productions             - Processed/canned/aged/cured meats (pre-packaged deli meats, hotdogs)             - MSG/glutamate:  soy sauce, flavor enhancer, pickled/preserved/marinated foods             - Sweeteners:  aspartame (Equal, Nutrasweet).  Sugar and Splenda are okay             - Vegetables:  legumes (lima beans, lentils, snow peas, fava beans, pinto peans, peas, garbanzo beans), sauerkraut, onions, olives, pickles             - Fruit:  avocados, bananas, citrus fruit (orange, lemon, grapefruit), mango             - Other:  Frozen meals, macaroni and cheese 7. Routine exercise 8. Stay adequately hydrated (aim for 64  oz water daily) 9. Keep headache diary 10. Maintain proper stress management 11. Maintain proper sleep hygiene 12. Do not skip meals  13. Consider supplements:  magnesium citrate 400mg  daily, riboflavin 400mg  daily, coenzyme Q10 100mg  three times daily.

## 2023-07-23 NOTE — Assessment & Plan Note (Signed)
8/10 pain  Nausea associated.  Stat ct abd pelvis with  Npo since 4 hours ago.  Stat cbc cmp ordered Given strict red flag precautions to go to Er/call 911.   Ddx appendicitis, low suspicion diverticulitis, uti.

## 2023-07-23 NOTE — Progress Notes (Signed)
Established Patient Office Visit  Subjective:   Patient ID: Robyn Ortiz, female    DOB: 28-Sep-1985  Age: 37 y.o. MRN: [id number]  CC:  Chief Complaint  Patient presents with   Migraine    Onset was 07/14/23.    HPI: Robyn Ortiz is a 37 y.o. female presenting on 07/23/2023 for Migraine (Onset was 07/14/23.)  Viral syndrome: went to Er 11/28 for c/o flu like symptoms.  Covid and flu was negative. CXR was negative.  Right upper abdominal pain that is 'achy' was sharp previously. Not sure if this is from coughing or not. Worse with movement. Given zofran and ibuprofen without much relief.   Has since been taking tylenol cold and flu and delsym without much relief.  Symptoms today include nasal congestion, productive cough (mild), chest congestion, nose bleeds, pnd, and sore throat. Hard to sleep at night time due to coughing. She has a migraine that feels like 'it is on fire' the front of her head is 'burning', and this sensation comes and goes. It will go on for bout an hour or two, will take tylenol advil and the burning will stop and then just throb instead. Does also get light headed and dizzy.   She does report she initially went to ER because she has bad right lower abdominal quadrant pain. She states pain is still ongoing, thre pain is a constant dull but if you touch it or palpate it the pain is 8/10  Migraines: since she was a teenager, occur about three times a week, typically last for at least 4 hours to at times the entire day. Light sensitive, sound sensitive, nausea, sometimes throws up. No diarrhea. Does get tingling in her tongue with the migraines. Does get auras but not always. Has seen neurologist in the past but many years ago she states. Overdue for eye exam. Is back on aimovig, did decrease frequency of her headaches from daily to three times a week but this headache she has currently is different from usual. Uses maxalt prn as well. In the past was on topamax,  nurtec, RX nose spray anti migraine. She states she has been on more but doesn't quite remember the name.   Urinary frequency urgency and a darker yellow color while also taking in a lot of oral fluids/water.      ROS: Negative unless specifically indicated above in HPI.   Relevant past medical history reviewed and updated as indicated.   Allergies and medications reviewed and updated.   Current Outpatient Medications:    acetaminophen (TYLENOL) 325 MG tablet, Take 650 mg by mouth every 6 (six) hours as needed for moderate pain., Disp: , Rfl:    AIMOVIG 140 MG/ML SOAJ, SMARTSIG:140 Milligram(s) SUB-Q Every 4 Weeks, Disp: 1 mL, Rfl: 5   amoxicillin-clavulanate (AUGMENTIN) 875-125 MG tablet, Take 1 tablet by mouth 2 (two) times daily., Disp: 20 tablet, Rfl: 0   cetirizine (ZYRTEC) 10 MG tablet, Take 10 mg by mouth daily., Disp: , Rfl: 5   cholestyramine (QUESTRAN) 4 g packet, TAKE 1 PACKET BY MOUTH 2 TIMES DAILY., Disp: 180 packet, Rfl: 5   montelukast (SINGULAIR) 10 MG tablet, Take 10 mg by mouth daily., Disp: , Rfl:    mupirocin ointment (BACTROBAN) 2 %, Apply 1 Application topically 2 (two) times daily., Disp: 22 g, Rfl: 0   ondansetron (ZOFRAN) 4 MG tablet, Take 1 tablet (4 mg total) by mouth every 6 (six) hours., Disp: 12 tablet, Rfl: 0   predniSONE (STERAPRED UNI-PAK  21 TAB) 10 MG (21) TBPK tablet, Take as directed, Disp: 1 each, Rfl: 0   PROAIR HFA 108 (90 Base) MCG/ACT inhaler, Inhale 2 puffs into the lungs every 6 (six) hours as needed for wheezing or shortness of breath., Disp: , Rfl:    promethazine (PHENERGAN) 25 MG tablet, Take 1 tablet (25 mg total) by mouth every 6 (six) hours as needed for nausea or vomiting., Disp: 5 tablet, Rfl: 0   rizatriptan (MAXALT) 5 MG tablet, Take 1 tablet (5 mg total) by mouth as needed for migraine. May repeat in 2 hours if needed, Disp: 10 tablet, Rfl: 0   Semaglutide-Weight Management (WEGOVY) 0.5 MG/0.5ML SOAJ, After four weeks of 0.25 mg qweek,  increase to 0.5 mg qweek, Disp: 2 mL, Rfl: 2  Allergies  Allergen Reactions   Codeine Other (See Comments)    Pt. States she had a seizure   Latex     Objective:   BP 112/70 (BP Location: Left Arm, Patient Position: Sitting, Cuff Size: Normal)   Pulse 78   Temp 98 F (36.7 C) (Temporal)   Ht 5\' 5"  (1.651 m)   Wt 189 lb (85.7 kg)   LMP 07/08/2023   SpO2 98%   BMI 31.45 kg/m    Physical Exam Constitutional:      General: She is not in acute distress.    Appearance: Normal appearance. She is normal weight. She is not ill-appearing, toxic-appearing or diaphoretic.  HENT:     Head: Normocephalic.     Right Ear: Tympanic membrane normal.     Left Ear: Tympanic membrane normal. There is impacted cerumen.     Nose: Nasal tenderness, mucosal edema, congestion and rhinorrhea present.     Right Nostril: Epistaxis (dried) present.     Left Nostril: Epistaxis (dried) present.     Right Turbinates: Enlarged and swollen.     Left Turbinates: Enlarged and swollen.     Right Sinus: Maxillary sinus tenderness and frontal sinus tenderness present.     Left Sinus: Maxillary sinus tenderness and frontal sinus tenderness present.     Mouth/Throat:     Mouth: Mucous membranes are dry.     Pharynx: Oropharyngeal exudate, posterior oropharyngeal erythema and postnasal drip present.     Tonsils: No tonsillar exudate. 0 on the right. 0 on the left.     Comments: No tonsils present Eyes:     Extraocular Movements: Extraocular movements intact.     Pupils: Pupils are equal, round, and reactive to light.  Cardiovascular:     Rate and Rhythm: Normal rate and regular rhythm.     Pulses: Normal pulses.     Heart sounds: Normal heart sounds.  Pulmonary:     Effort: Pulmonary effort is normal.     Breath sounds: Normal breath sounds.  Abdominal:     General: Abdomen is flat. Bowel sounds are normal. There is no distension.     Palpations: Abdomen is soft.     Tenderness: There is abdominal  tenderness in the right upper quadrant and right lower quadrant. Positive signs include Rovsing's sign, McBurney's sign, psoas sign and obturator sign. Negative signs include Murphy's sign.     Hernia: No hernia is present.  Musculoskeletal:     Cervical back: Normal range of motion.  Lymphadenopathy:     Cervical: Cervical adenopathy present.     Right cervical: Superficial cervical adenopathy present.     Left cervical: Superficial cervical adenopathy present.  Neurological:  General: No focal deficit present.     Mental Status: She is alert and oriented to person, place, and time. Mental status is at baseline.     Cranial Nerves: No cranial nerve deficit or facial asymmetry.     Sensory: Sensation is intact.  Psychiatric:        Mood and Affect: Mood normal.        Behavior: Behavior normal.        Thought Content: Thought content normal.        Judgment: Judgment normal.   Rlq abdominal pain 8/10 on palpation per pt   Assessment & Plan:  Epistaxis Assessment & Plan: Active in office.  Cauterized with silver nitrate.  Pt advised to use nasal saline x 4 a day and apply mupirocin ointment. Also can use aquaphor at night time.   Orders: -     Mupirocin; Apply 1 Application topically 2 (two) times daily.  Dispense: 22 g; Refill: 0 -     CBC with Differential/Platelet  Acute nonintractable headache, unspecified headache type Assessment & Plan: Suspect sinus driven will treat for sinusitis   Orders: -     Sedimentation rate -     Vitamin B12  Low serum vitamin B12 -     Vitamin B12  Right upper quadrant abdominal pain -     Comprehensive metabolic panel  Intractable migraine with aura with status migrainosus Assessment & Plan: Multiple treatment failures,  Referral to neurology for eval/treat  Sed rate and crp ordered  Tsh reviewed from previous, normal .  Given conservative recommendations as well.    Orders: -     C-reactive protein -     Ambulatory referral  to Neurology  Sore throat -     POCT rapid strep A  Acute non-recurrent frontal sinusitis Assessment & Plan: Prescription given for augmentin 875/125 mg po bid for ten days and prednisone, however pt advised to wait on this regimen pending results of CT. Pt to continue tylenol/ibuprofen prn sinus pain. Continue with humidifier prn and steam showers recommended as well. instructed If no symptom improvement in 48 hours please f/u   Orders: -     Amoxicillin-Pot Clavulanate; Take 1 tablet by mouth 2 (two) times daily.  Dispense: 20 tablet; Refill: 0 -     predniSONE; Take as directed  Dispense: 1 each; Refill: 0  Right lower quadrant abdominal pain Assessment & Plan: 8/10 pain  Nausea associated.  Stat ct abd pelvis with  Npo since 4 hours ago.  Stat cbc cmp ordered Given strict red flag precautions to go to Er/call 911.   Ddx appendicitis, low suspicion diverticulitis, uti.   Orders: -     CT ABDOMEN PELVIS W CONTRAST; Future -     Urinalysis w microscopic + reflex cultur  Nausea     Follow up plan: Return in about 3 months (around 10/21/2023) for follow up headaches.  Mort Sawyers, FNP

## 2023-07-24 LAB — URINALYSIS W MICROSCOPIC + REFLEX CULTURE
Bacteria, UA: NONE SEEN /[HPF]
Bilirubin Urine: NEGATIVE
Glucose, UA: NEGATIVE
Hgb urine dipstick: NEGATIVE
Hyaline Cast: NONE SEEN /[LPF]
Ketones, ur: NEGATIVE
Leukocyte Esterase: NEGATIVE
Nitrites, Initial: NEGATIVE
Protein, ur: NEGATIVE
RBC / HPF: NONE SEEN /[HPF] (ref 0–2)
Specific Gravity, Urine: 1.012 (ref 1.001–1.035)
WBC, UA: NONE SEEN /[HPF] (ref 0–5)
pH: 8 (ref 5.0–8.0)

## 2023-07-24 LAB — NO CULTURE INDICATED

## 2023-07-25 MED ORDER — RIZATRIPTAN BENZOATE 5 MG PO TABS: 5.0000 mg | ORAL_TABLET | ORAL | 0 refills | Status: DC | PRN

## 2023-07-26 ENCOUNTER — Encounter: Payer: Self-pay | Admitting: Neurology

## 2023-08-03 ENCOUNTER — Encounter: Payer: Self-pay | Admitting: Neurology

## 2023-08-03 ENCOUNTER — Encounter: Payer: Self-pay | Admitting: Family

## 2023-08-03 DIAGNOSIS — J0141 Acute recurrent pansinusitis: Secondary | ICD-10-CM

## 2023-08-03 DIAGNOSIS — G43101 Migraine with aura, not intractable, with status migrainosus: Secondary | ICD-10-CM

## 2023-08-05 MED ORDER — LEVOFLOXACIN 500 MG PO TABS: 500.0000 mg | ORAL_TABLET | Freq: Every day | ORAL | 0 refills | Status: AC

## 2023-08-05 NOTE — Addendum Note (Signed)
Addended by: Mort Sawyers on: 08/05/2023 03:39 PM   Modules accepted: Orders

## 2023-08-13 ENCOUNTER — Telehealth: Payer: Self-pay | Admitting: Gastroenterology

## 2023-08-13 ENCOUNTER — Encounter: Payer: Self-pay | Admitting: Family

## 2023-08-13 ENCOUNTER — Ambulatory Visit (INDEPENDENT_AMBULATORY_CARE_PROVIDER_SITE_OTHER): Payer: Medicaid Other | Admitting: Family

## 2023-08-13 ENCOUNTER — Other Ambulatory Visit: Payer: Self-pay | Admitting: Family

## 2023-08-13 VITALS — BP 108/68 | HR 76 | Temp 98.4°F | Resp 16 | Ht 65.0 in | Wt 187.0 lb

## 2023-08-13 DIAGNOSIS — R040 Epistaxis: Secondary | ICD-10-CM | POA: Diagnosis not present

## 2023-08-13 DIAGNOSIS — R1010 Upper abdominal pain, unspecified: Secondary | ICD-10-CM | POA: Insufficient documentation

## 2023-08-13 DIAGNOSIS — J309 Allergic rhinitis, unspecified: Secondary | ICD-10-CM | POA: Insufficient documentation

## 2023-08-13 DIAGNOSIS — N9489 Other specified conditions associated with female genital organs and menstrual cycle: Secondary | ICD-10-CM | POA: Insufficient documentation

## 2023-08-13 DIAGNOSIS — R7989 Other specified abnormal findings of blood chemistry: Secondary | ICD-10-CM | POA: Diagnosis not present

## 2023-08-13 DIAGNOSIS — R102 Pelvic and perineal pain: Secondary | ICD-10-CM | POA: Insufficient documentation

## 2023-08-13 DIAGNOSIS — K2100 Gastro-esophageal reflux disease with esophagitis, without bleeding: Secondary | ICD-10-CM | POA: Insufficient documentation

## 2023-08-13 DIAGNOSIS — J329 Chronic sinusitis, unspecified: Secondary | ICD-10-CM | POA: Diagnosis not present

## 2023-08-13 LAB — CBC WITH DIFFERENTIAL/PLATELET
Basophils Absolute: 0 10*3/uL (ref 0.0–0.1)
Basophils Relative: 0.4 % (ref 0.0–3.0)
Eosinophils Absolute: 0.2 10*3/uL (ref 0.0–0.7)
Eosinophils Relative: 4.5 % (ref 0.0–5.0)
HCT: 39.8 % (ref 36.0–46.0)
Hemoglobin: 13.2 g/dL (ref 12.0–15.0)
Lymphocytes Relative: 34.3 % (ref 12.0–46.0)
Lymphs Abs: 1.8 10*3/uL (ref 0.7–4.0)
MCHC: 33.3 g/dL (ref 30.0–36.0)
MCV: 92.5 fL (ref 78.0–100.0)
Monocytes Absolute: 0.4 10*3/uL (ref 0.1–1.0)
Monocytes Relative: 7.8 % (ref 3.0–12.0)
Neutro Abs: 2.7 10*3/uL (ref 1.4–7.7)
Neutrophils Relative %: 53 % (ref 43.0–77.0)
Platelets: 239 10*3/uL (ref 150.0–400.0)
RBC: 4.3 Mil/uL (ref 3.87–5.11)
RDW: 13.2 % (ref 11.5–15.5)
WBC: 5.1 10*3/uL (ref 4.0–10.5)

## 2023-08-13 LAB — COMPREHENSIVE METABOLIC PANEL
ALT: 34 U/L (ref 0–35)
AST: 27 U/L (ref 0–37)
Albumin: 4.1 g/dL (ref 3.5–5.2)
Alkaline Phosphatase: 43 U/L (ref 39–117)
BUN: 17 mg/dL (ref 6–23)
CO2: 22 meq/L (ref 19–32)
Calcium: 8.6 mg/dL (ref 8.4–10.5)
Chloride: 112 meq/L (ref 96–112)
Creatinine, Ser: 0.62 mg/dL (ref 0.40–1.20)
GFR: 114.54 mL/min (ref >60.00)
Glucose, Bld: 85 mg/dL (ref 70–99)
Potassium: 4 meq/L (ref 3.5–5.1)
Sodium: 141 meq/L (ref 135–145)
Total Bilirubin: 0.3 mg/dL (ref 0.2–1.2)
Total Protein: 6.6 g/dL (ref 6.0–8.3)

## 2023-08-13 LAB — AMYLASE: Amylase: 34 U/L (ref 27–131)

## 2023-08-13 LAB — LIPASE: Lipase: 42 U/L (ref 11.0–59.0)

## 2023-08-13 MED ORDER — PANTOPRAZOLE SODIUM 40 MG PO TBEC: 40.0000 mg | DELAYED_RELEASE_TABLET | Freq: Two times a day (BID) | ORAL | 2 refills | Status: DC

## 2023-08-13 MED ORDER — PREDNISONE 10 MG (21) PO TBPK: ORAL_TABLET | ORAL | 0 refills | Status: DC

## 2023-08-13 MED ORDER — PANTOPRAZOLE SODIUM 40 MG PO TBEC: 40.0000 mg | DELAYED_RELEASE_TABLET | Freq: Every day | ORAL | 2 refills | Status: DC

## 2023-08-13 MED ORDER — AZELASTINE-FLUTICASONE 137-50 MCG/ACT NA SUSP: 1.0000 | Freq: Two times a day (BID) | NASAL | 2 refills | Status: DC

## 2023-08-13 MED ORDER — RIZATRIPTAN BENZOATE 5 MG PO TABS: 5.0000 mg | ORAL_TABLET | ORAL | 0 refills | Status: DC | PRN

## 2023-08-13 NOTE — Patient Instructions (Addendum)
  referral was placed today for both ENT and gynecology.  Please let us know if you have not heard back within 2 weeks about the referral.

## 2023-08-13 NOTE — Progress Notes (Signed)
Established Patient Office Visit  Subjective:      CC:  Chief Complaint  Patient presents with   Discuss CT results    HPI: Robyn Ortiz is a 37 y.o. female presenting on 08/13/2023 for Discuss CT results  She has had ongoing upper respiratory symptoms for over a month at this time. She still has nasal congestion, sinus pressure, right ear ache (which is newer onset) and chest congestion with coughing. She states it is starting to cough more and this is causing her tenderness in her lower rib case area. She was given augmentin bid x 10 days and prednisone, did not help so we changed to levaquin 500 mg po bid however pt states still no relief. Still with same symptoms.   She does notice some heartburn as well. She is currently taking omeprazole 40 mg once daily, was prescribed by Dr. Allegra Lai.   Ongoing, unrelated, she doesn't drink alcohol because it causes her to feel very uncomfortable in her epigastric area to the point of feeling nauseous and about to pass out. She has seen Dr. Allegra Lai, GI, with EGD and colonoscopy 12/18/21 neg for h pylori, dysplasia and malignancy. Mild reactive gastropathy and reflux see. Last visit with GI 05/12/23. She states if she even misses a pill she will have pretty serious reflux.   Ct abd pelvis, 07/23/23: fatty liver. Minimal vascular calcifications, prominent left sided ovarian vein and uterine veins. Suspected pelvic congestion syndrome, hurts with sex during and after, does burn when she pees at times, and painful periods. She is symptomatic. She did do depo but she put on a lot of weight at that time. She does not want an IUD. Regular periods however last for seven days and heavy clots every day until the end. Pretty painful and crampy during her periods. She does vape daily.  New complaints: She still states that epitaxis still ongoing. She is having daily nose bleeds still. She is using mupirocin ointment without relief. She uses flonase daily however  she stopped it five days ago and still with nose bleeds.     Social history:  Relevant past medical, surgical, family and social history reviewed and updated as indicated. Interim medical history since our last visit reviewed.  Allergies and medications reviewed and updated.  DATA REVIEWED: CHART IN EPIC     ROS: Negative unless specifically indicated above in HPI.    Current Outpatient Medications:    Azelastine-Fluticasone 137-50 MCG/ACT SUSP, Place 1 spray into the nose every 12 (twelve) hours., Disp: 23 g, Rfl: 2   predniSONE (STERAPRED UNI-PAK 21 TAB) 10 MG (21) TBPK tablet, Take as directed, Disp: 1 each, Rfl: 0   acetaminophen (TYLENOL) 325 MG tablet, Take 650 mg by mouth every 6 (six) hours as needed for moderate pain., Disp: , Rfl:    AIMOVIG 140 MG/ML SOAJ, SMARTSIG:140 Milligram(s) SUB-Q Every 4 Weeks, Disp: 1 mL, Rfl: 5   cetirizine (ZYRTEC) 10 MG tablet, Take 10 mg by mouth daily., Disp: , Rfl: 5   cholestyramine (QUESTRAN) 4 g packet, TAKE 1 PACKET BY MOUTH 2 TIMES DAILY., Disp: 180 packet, Rfl: 5   montelukast (SINGULAIR) 10 MG tablet, Take 10 mg by mouth daily., Disp: , Rfl:    mupirocin ointment (BACTROBAN) 2 %, Apply 1 Application topically 2 (two) times daily., Disp: 22 g, Rfl: 0   ondansetron (ZOFRAN) 4 MG tablet, Take 1 tablet (4 mg total) by mouth every 6 (six) hours., Disp: 12 tablet, Rfl: 0   pantoprazole (  PROTONIX) 40 MG tablet, Take 1 tablet (40 mg total) by mouth 2 (two) times daily., Disp: 60 tablet, Rfl: 2   PROAIR HFA 108 (90 Base) MCG/ACT inhaler, Inhale 2 puffs into the lungs every 6 (six) hours as needed for wheezing or shortness of breath., Disp: , Rfl:    rizatriptan (MAXALT) 5 MG tablet, Take 1 tablet (5 mg total) by mouth as needed for migraine. May repeat in 2 hours if needed, Disp: 10 tablet, Rfl: 0   Semaglutide-Weight Management (WEGOVY) 0.5 MG/0.5ML SOAJ, After four weeks of 0.25 mg qweek, increase to 0.5 mg qweek, Disp: 2 mL, Rfl: 2       Objective:    BP 108/68   Pulse 76   Temp 98.4 F (36.9 C)   Resp 16   Ht 5\' 5"  (1.651 m)   Wt 187 lb (84.8 kg)   LMP 07/08/2023   SpO2 99%   BMI 31.12 kg/m   Wt Readings from Last 3 Encounters:  08/13/23 187 lb (84.8 kg)  07/23/23 189 lb (85.7 kg)  07/15/23 185 lb 3 oz (84 kg)    Physical Exam Vitals reviewed.  Constitutional:      General: She is not in acute distress.    Appearance: Normal appearance. She is normal weight. She is not ill-appearing, toxic-appearing or diaphoretic.  HENT:     Head: Normocephalic.     Right Ear: Tympanic membrane normal.     Left Ear: Tympanic membrane normal.     Nose:     Right Sinus: Maxillary sinus tenderness and frontal sinus tenderness present.     Left Sinus: Maxillary sinus tenderness and frontal sinus tenderness present.     Mouth/Throat:     Mouth: Mucous membranes are dry.     Pharynx: No oropharyngeal exudate or posterior oropharyngeal erythema.  Eyes:     Extraocular Movements: Extraocular movements intact.     Pupils: Pupils are equal, round, and reactive to light.  Cardiovascular:     Rate and Rhythm: Normal rate and regular rhythm.     Pulses: Normal pulses.     Heart sounds: Normal heart sounds.  Pulmonary:     Effort: Pulmonary effort is normal.     Breath sounds: Normal breath sounds.  Abdominal:     Palpations: Abdomen is soft.     Tenderness: There is abdominal tenderness in the right upper quadrant, epigastric area, left upper quadrant and left lower quadrant. There is no guarding or rebound.     Hernia: No hernia is present.  Musculoskeletal:     Cervical back: Normal range of motion.  Neurological:     General: No focal deficit present.     Mental Status: She is alert and oriented to person, place, and time. Mental status is at baseline.  Psychiatric:        Mood and Affect: Mood normal.        Behavior: Behavior normal.        Thought Content: Thought content normal.        Judgment: Judgment  normal.           Assessment & Plan:  Pelvic congestion syndrome Assessment & Plan: Reviewed CT results with pt  Pt declined depo provera  Symptomatic for this Pt to establish with gyn for further eval/treat Referral placed.  Orders: -     Ambulatory referral to Gynecology  Recurrent sinusitis Assessment & Plan: Ongoing Treatment failure augmentin, levaquin, and only mild improvement with prednisone Referral placed  for ENT to eval/treat  Orders: -     Ambulatory referral to ENT -     predniSONE; Take as directed  Dispense: 1 each; Refill: 0  Epistaxis Assessment & Plan: Ongoing Treatment failure mupirocin, did not improve with cessation of flonase Silver nitrate applied last visit without improvement Referral placed for ENT for evaluation  Orders: -     Ambulatory referral to ENT  Allergic rhinitis, unspecified seasonality, unspecified trigger Assessment & Plan: Stop flonase and zyrtec Start astelin/flonase    Orders: -     Azelastine-Fluticasone; Place 1 spray into the nose every 12 (twelve) hours.  Dispense: 23 g; Refill: 2  Gastroesophageal reflux disease with esophagitis without hemorrhage Assessment & Plan: Change from omeprazole to pantoprazole CT abdomen unremarkable for acute findings. F/u with GI  Orders: -     Pantoprazole Sodium; Take 1 tablet (40 mg total) by mouth 2 (two) times daily.  Dispense: 60 tablet; Refill: 2  Upper abdominal pain Assessment & Plan: Ongoing. Lipase and amylase ordered today Repeating cmp for liver function  Pending results.  Recent CT abd unremarkable for acute findings.  Orders: -     Lipase -     Amylase -     CBC with Differential/Platelet  Elevated LFTs -     Comprehensive metabolic panel     Return in about 3 months (around 11/11/2023).  Mort Sawyers, MSN, APRN, FNP-C [facility]

## 2023-08-13 NOTE — Assessment & Plan Note (Signed)
Change from omeprazole to pantoprazole CT abdomen unremarkable for acute findings. F/u with GI

## 2023-08-13 NOTE — Assessment & Plan Note (Signed)
Ongoing Treatment failure augmentin, levaquin, and only mild improvement with prednisone Referral placed for ENT to eval/treat

## 2023-08-13 NOTE — Telephone Encounter (Signed)
The patient called in to schedule office visit with Dr. Allegra Lai.

## 2023-08-13 NOTE — Assessment & Plan Note (Signed)
Stop flonase and zyrtec Start astelin/flonase

## 2023-08-13 NOTE — Assessment & Plan Note (Signed)
Ongoing Treatment failure mupirocin, did not improve with cessation of flonase Silver nitrate applied last visit without improvement Referral placed for ENT for evaluation

## 2023-08-13 NOTE — Assessment & Plan Note (Signed)
Reviewed CT results with pt  Pt declined depo provera  Symptomatic for this Pt to establish with gyn for further eval/treat Referral placed.

## 2023-08-13 NOTE — Assessment & Plan Note (Signed)
Ongoing. Lipase and amylase ordered today Repeating cmp for liver function  Pending results.  Recent CT abd unremarkable for acute findings.

## 2023-08-13 NOTE — Addendum Note (Signed)
Addended by: Mort Sawyers on: 08/13/2023 11:50 AM   Modules accepted: Orders

## 2023-08-19 ENCOUNTER — Encounter (INDEPENDENT_AMBULATORY_CARE_PROVIDER_SITE_OTHER): Payer: Self-pay

## 2023-08-31 ENCOUNTER — Telehealth: Payer: Self-pay | Admitting: Neurology

## 2023-08-31 ENCOUNTER — Encounter: Payer: Self-pay | Admitting: Neurology

## 2023-09-07 ENCOUNTER — Encounter: Payer: Self-pay | Admitting: Family

## 2023-09-07 DIAGNOSIS — G43101 Migraine with aura, not intractable, with status migrainosus: Secondary | ICD-10-CM

## 2023-09-22 ENCOUNTER — Other Ambulatory Visit: Payer: Self-pay | Admitting: Family

## 2023-09-22 DIAGNOSIS — G43101 Migraine with aura, not intractable, with status migrainosus: Secondary | ICD-10-CM

## 2023-09-23 ENCOUNTER — Telehealth (INDEPENDENT_AMBULATORY_CARE_PROVIDER_SITE_OTHER): Payer: Self-pay | Admitting: Otolaryngology

## 2023-09-23 NOTE — Telephone Encounter (Signed)
 Reminder call: Date: 09/24/2023 Status: Sch  Time: 10:30 AM [address] [place], [place] [address]  Confirmed time and location w/patient.

## 2023-09-24 ENCOUNTER — Encounter (INDEPENDENT_AMBULATORY_CARE_PROVIDER_SITE_OTHER): Payer: Self-pay

## 2023-09-24 ENCOUNTER — Ambulatory Visit (INDEPENDENT_AMBULATORY_CARE_PROVIDER_SITE_OTHER): Payer: Medicaid Other

## 2023-09-24 VITALS — BP 108/71 | HR 60 | Ht 65.0 in | Wt 187.0 lb

## 2023-09-24 DIAGNOSIS — J310 Chronic rhinitis: Secondary | ICD-10-CM | POA: Diagnosis not present

## 2023-09-24 DIAGNOSIS — R040 Epistaxis: Secondary | ICD-10-CM

## 2023-09-24 DIAGNOSIS — R0981 Nasal congestion: Secondary | ICD-10-CM | POA: Diagnosis not present

## 2023-09-24 DIAGNOSIS — J343 Hypertrophy of nasal turbinates: Secondary | ICD-10-CM

## 2023-09-24 DIAGNOSIS — H6123 Impacted cerumen, bilateral: Secondary | ICD-10-CM

## 2023-09-24 DIAGNOSIS — J342 Deviated nasal septum: Secondary | ICD-10-CM | POA: Diagnosis not present

## 2023-09-25 DIAGNOSIS — H6123 Impacted cerumen, bilateral: Secondary | ICD-10-CM | POA: Insufficient documentation

## 2023-09-25 DIAGNOSIS — J342 Deviated nasal septum: Secondary | ICD-10-CM | POA: Insufficient documentation

## 2023-09-25 DIAGNOSIS — J343 Hypertrophy of nasal turbinates: Secondary | ICD-10-CM | POA: Insufficient documentation

## 2023-09-25 DIAGNOSIS — J310 Chronic rhinitis: Secondary | ICD-10-CM | POA: Insufficient documentation

## 2023-09-25 NOTE — Progress Notes (Signed)
 Patient ID: Robyn Ortiz, female   DOB: 1986-08-16, 38 y.o.   MRN: [id number]  CC: Chronic nasal congestion, bilateral recurrent epistaxis  HPI:  Robyn Ortiz is a 38 y.o. female who presents today complaining of chronic nasal congestion and recurrent epistaxis.  She has been symptomatic for more than 3 years.  The frequency and severity of her nasal bleeding has increased over the past few months.  She has been having 4-5 episodes a week.  The bleeding is bilateral, usually worse on the right side.  She also complains of chronic nasal congestion.  However, she denies any facial pain, fever, or visual change.  In addition, she also complains of frequent clogging sensation in her ears.  She denies any significant otalgia or otorrhea.  She has no previous ENT surgery.  She also denies any history of facial or nasal trauma.  She is not on any blood thinner.  Past Medical History:  Diagnosis Date   Diverticulitis    GERD (gastroesophageal reflux disease)    History of bleeding ulcers    told not to take ibuprofen    History of bronchitis 25yrs ago   Migraine    Obesity    Sleep apnea    CPAP    Past Surgical History:  Procedure Laterality Date   CESAREAN SECTION N/A 04/08/2015   Procedure: CESAREAN SECTION;  Surgeon: Winton Felt, MD;  Location: [facility];  Service: Obstetrics;  Laterality: N/A;   CESAREAN SECTION WITH BILATERAL TUBAL LIGATION  04/08/2015   CHOLECYSTECTOMY  03/27/2014   Dr Dessa   COLONOSCOPY WITH PROPOFOL  N/A 12/18/2021   Procedure: COLONOSCOPY WITH PROPOFOL ;  Surgeon: Unk Corinn Skiff, MD;  [facility];  Service: Gastroenterology;  Laterality: N/A;   ESOPHAGOGASTRODUODENOSCOPY N/A 05/19/2017   Procedure: ESOPHAGOGASTRODUODENOSCOPY (EGD);  Surgeon: Unk Corinn Skiff, MD;  Location: [facility];  Service: Endoscopy;  Laterality: N/A;   ESOPHAGOGASTRODUODENOSCOPY (EGD) WITH PROPOFOL  N/A 12/18/2021   Procedure: ESOPHAGOGASTRODUODENOSCOPY (EGD)  WITH PROPOFOL ;  Surgeon: Unk Corinn Skiff, MD;  Location: [facility] ENDOSCOPY;  Service: Gastroenterology;  Laterality: N/A;   GRADUATED/TOLERANCE TEST  03/06/2022   Good exercise tolerance-10:24 min.  Reached peak heart rate of 169 bpm equals 91% MPHR at 186 bpm.  No EKG changes, chest pain or dyspnea.   TONSILLECTOMY  03/10/2012   Procedure: TONSILLECTOMY;  Surgeon: Norleen Notice, MD;  Location: [facility] OR;  Service: ENT;  Laterality: Bilateral;    Family History  Problem Relation Age of Onset   Bipolar disorder Mother    Schizophrenia Mother    Diabetes Father    Heart disease Sister    Suicidality Brother    Heart disease Maternal Grandmother    Alcohol abuse Maternal Grandfather    Gastric cancer Paternal Grandfather    Arthritis Paternal Grandfather     Social History:  reports that she has quit smoking. Her smoking use included cigarettes. She has a 13 pack-year smoking history. She has never used smokeless tobacco. She reports current alcohol use. She reports that she does not use drugs.  Allergies:  Allergies  Allergen Reactions   Codeine Other (See Comments)    Pt. States she had a seizure   Latex     Prior to Admission medications   Medication Sig Start Date End Date Taking? Authorizing Provider  acetaminophen  (TYLENOL ) 325 MG tablet Take 650 mg by mouth every 6 (six) hours as needed for moderate pain.   Yes [provider]  AIMOVIG  140 MG/ML SOAJ SMARTSIG:140 Milligram(s) SUB-Q Every 4  Weeks 03/26/23  Yes Dugal, Ginger, FNP  Azelastine -Fluticasone  137-50 MCG/ACT SUSP Place 1 spray into the nose every 12 (twelve) hours. 08/13/23  Yes Dugal, Tabitha, FNP  cetirizine  (ZYRTEC ) 10 MG tablet Take 10 mg by mouth daily. 04/04/17  Yes [provider]  cholestyramine  (QUESTRAN ) 4 g packet TAKE 1 PACKET BY MOUTH 2 TIMES DAILY. 05/20/23  Yes Vanga, Corinn Skiff, MD  montelukast (SINGULAIR) 10 MG tablet TAKE 1 TABLET BY MOUTH EVERY DAY 08/13/23  Yes Dugal, Tabitha, FNP   mupirocin  ointment (BACTROBAN ) 2 % Apply 1 Application topically 2 (two) times daily. 07/23/23  Yes Dugal, Ginger, FNP  ondansetron  (ZOFRAN ) 4 MG tablet Take 1 tablet (4 mg total) by mouth every 6 (six) hours. 07/15/23  Yes Young, Caron PARAS, DO  pantoprazole  (PROTONIX ) 40 MG tablet Take 1 tablet (40 mg total) by mouth 2 (two) times daily. 08/13/23  Yes Dugal, Ginger, FNP  predniSONE  (STERAPRED UNI-PAK 21 TAB) 10 MG (21) TBPK tablet Take as directed 08/13/23  Yes Dugal, Tabitha, FNP  PROAIR  HFA 108 (90 Base) MCG/ACT inhaler Inhale 2 puffs into the lungs every 6 (six) hours as needed for wheezing or shortness of breath. 02/11/23  Yes Dugal, Tabitha, FNP  rizatriptan  (MAXALT ) 5 MG tablet TAKE 1 TABLET BY MOUTH AS NEEDED FOR MIGRAINE. MAY REPEAT IN 2 HOURS IF NEEDED 09/22/23  Yes Dugal, Tabitha, FNP  Semaglutide -Weight Management (WEGOVY ) 0.5 MG/0.5ML SOAJ After four weeks of 0.25 mg qweek, increase to 0.5 mg qweek 03/26/23  Yes Dugal, Tabitha, FNP    Blood pressure 108/71, pulse 60, height 5' 5 (1.651 m), weight 187 lb (84.8 kg), SpO2 98%. Exam: General: Communicates without difficulty, well nourished, no acute distress. Head: Normocephalic, no evidence injury, no tenderness, facial buttresses intact without stepoff. Face/sinus: No tenderness to palpation and percussion. Facial movement is normal and symmetric. Eyes: PERRL, EOMI. No scleral icterus, conjunctivae clear. Neuro: CN II exam reveals vision grossly intact.  No nystagmus at any point of gaze. Ears: Auricles well formed without lesions.  Bilateral cerumen impaction.  Nose: External evaluation reveals normal support and skin without lesions.  Dorsum is intact.  Anterior rhinoscopy reveals congested mucosa over anterior aspect of inferior turbinates and intact septum.  Hypervascular areas are noted on her nasal septum bilaterally.  Oral:  Oral cavity and oropharynx are intact, symmetric, without erythema or edema.  Mucosa is moist without lesions. Neck:  Full range of motion without pain.  There is no significant lymphadenopathy.  No masses palpable.  Thyroid  bed within normal limits to palpation.  Parotid glands and submandibular glands equal bilaterally without mass.  Trachea is midline. Neuro:  CN 2-12 grossly intact.   Procedure:  Endoscopic control of recurrent bilateral epistaxis. Indication:  Recurrent epistaxis  Description: Both nasal cavities are sprayed with topical xylocaine  and neo-synephrine.  After adequate anesthesia is achieved, the right nasal cavity is examined with a 0 rigid endoscope.  Deviated nasal septum and bilateral inferior turbinate hypertrophy are noted.  A suction catheter is inserted into parallel with the 0 endoscope, and it is used to suction blood clots from the nasal cavity.  Several hypervascular areas are noted on the anterior and superior portion of the septum. Active bleeding is noted. A silver nitrate stick is inserted in parallel with the 0 endoscope.  It is used to repeatedly cauterized the hypervascular areas.  Good hemostasis is achieved.  The same procedure is repeated on the left side.  In addition to the left nasal septum, the left  inferior turbinate is also cauterized.  The patient tolerated the procedure well.    Procedure: Bilateral cerumen disimpaction Anesthesia: None Description: Under the operating microscope, the cerumen is carefully removed with a combination of cerumen currette, alligator forceps, and suction catheters.  After the cerumen is removed, the TMs are noted to be normal.  No mass, erythema, or lesions. The patient tolerated the procedure well.    Assessment: 1.  Chronic rhinitis with nasal mucosal congestion, nasal septal deviation, and bilateral inferior turbinate hypertrophy. 2.  Bilateral recurrent epistaxis.  Hypervascular areas are noted on the nasal septum bilaterally.  In addition, a hypervascular area with active bleeding is also noted on the left inferior turbinate. 3.   Bilateral cerumen impaction.  Plan: 1.  Otomicroscopy with bilateral cerumen disimpaction. 2.  The physical exam and nasal endoscopy findings are reviewed with the patient. 3.  Endoscopic cauterization of both nasal cavities. 4.  Humidifier/nasal ointment during the winter months. 5.  Nasal saline irrigation is encouraged. 6.  The patient will return for reevaluation in 1 month.  Robyn Ortiz 09/25/2023, 7:59 PM

## 2023-10-08 ENCOUNTER — Ambulatory Visit (INDEPENDENT_AMBULATORY_CARE_PROVIDER_SITE_OTHER): Payer: Medicaid Other | Admitting: Obstetrics

## 2023-10-08 ENCOUNTER — Other Ambulatory Visit (HOSPITAL_COMMUNITY)
Admission: RE | Admit: 2023-10-08 | Discharge: 2023-10-08 | Disposition: A | Discharge status: Home / Self Care | Payer: Medicaid Other | Source: Ambulatory Visit | Attending: Obstetrics | Admitting: Obstetrics

## 2023-10-08 ENCOUNTER — Encounter: Payer: Self-pay | Admitting: Obstetrics

## 2023-10-08 VITALS — BP 113/64 | HR 74 | Ht 65.0 in | Wt 191.0 lb

## 2023-10-08 DIAGNOSIS — N939 Abnormal uterine and vaginal bleeding, unspecified: Secondary | ICD-10-CM | POA: Diagnosis not present

## 2023-10-08 DIAGNOSIS — N920 Excessive and frequent menstruation with regular cycle: Secondary | ICD-10-CM

## 2023-10-08 DIAGNOSIS — R102 Pelvic and perineal pain: Secondary | ICD-10-CM | POA: Diagnosis not present

## 2023-10-08 DIAGNOSIS — Z124 Encounter for screening for malignant neoplasm of cervix: Secondary | ICD-10-CM | POA: Insufficient documentation

## 2023-10-08 MED ORDER — TRANEXAMIC ACID 650 MG PO TABS: 1300.0000 mg | ORAL_TABLET | Freq: Three times a day (TID) | ORAL | 0 refills | Status: DC

## 2023-10-08 NOTE — Progress Notes (Signed)
GYNECOLOGY PROGRESS NOTE  Subjective:  PCP: Mort Sawyers, FNP  Patient ID: Robyn Ortiz, female    DOB: [birth year], 38 y.o.   MRN: [id number]  HPI  Patient is a 38 y.o. [id number] female who presents for painful periods. Her periods are every month, lasting 7 days. She changes her tampon every 30 mins. Periods have been heavy for the past 9 years. Noticed a change in heaviness and pain after her first pregnancy. Pt has pelvic pain before and after her period. After BTL in 2016, felt periods became even heavier with clots. Is not on any hormonal contraception or treatment for the bleeding. Last pap 05/03/15, no hx of abnormals.  GYN Hx: Period Cycle (Days): 30 Period Duration (Days): 7 Period Pattern: Regular Menstrual Flow: Heavy Menstrual Control: Tampon Menstrual Control Change Freq (Hours): Dysmenorrhea: (!) Severe Dysmenorrhea Symptoms: Cramping STI Hx: denies  OB History     Gravida  3   Para  3   Term  3   Preterm      AB      Living  3      SAB      IAB      Ectopic      Multiple  0   Live Births  3        Obstetric Comments  1st Menstrual Cycle:  14  1st Pregnancy:  17        Past Medical History:  Diagnosis Date   Diverticulitis    GERD (gastroesophageal reflux disease)    History of bleeding ulcers    told not to take ibuprofen   History of bronchitis 70yrs ago   Migraine    Obesity    Sleep apnea    CPAP   Past Surgical History:  Procedure Laterality Date   CESAREAN SECTION N/A 04/08/2015   Procedure: CESAREAN SECTION;  Surgeon: Catalina Antigua, MD;  Location: [facility];  Service: Obstetrics;  Laterality: N/A;   CESAREAN SECTION WITH BILATERAL TUBAL LIGATION  04/08/2015   CHOLECYSTECTOMY  03/27/2014   Dr Lemar Livings   COLONOSCOPY WITH PROPOFOL N/A 12/18/2021   Procedure: COLONOSCOPY WITH PROPOFOL;  Surgeon: Toney Reil, MD;  Location: [facility] ENDOSCOPY;  Service: Gastroenterology;  Laterality: N/A;    ESOPHAGOGASTRODUODENOSCOPY N/A 05/19/2017   Procedure: ESOPHAGOGASTRODUODENOSCOPY (EGD);  Surgeon: Toney Reil, MD;  Location: [facility];  Service: Endoscopy;  Laterality: N/A;   ESOPHAGOGASTRODUODENOSCOPY (EGD) WITH PROPOFOL N/A 12/18/2021   Procedure: ESOPHAGOGASTRODUODENOSCOPY (EGD) WITH PROPOFOL;  Surgeon: Toney Reil, MD;  Location: [facility] ENDOSCOPY;  Service: Gastroenterology;  Laterality: N/A;   GRADUATED/TOLERANCE TEST  03/06/2022   Good exercise tolerance-10:24 min.  Reached peak heart rate of 169 bpm equals 91% MPHR at 186 bpm.  No EKG changes, chest pain or dyspnea.   TONSILLECTOMY  03/10/2012   Procedure: TONSILLECTOMY;  Surgeon: Suzanna Obey, MD;  Location: [facility] OR;  Service: ENT;  Laterality: Bilateral;   Family History  Problem Relation Age of Onset   Bipolar disorder Mother    Schizophrenia Mother    Diabetes Father    Heart disease Sister    Suicidality Brother    Heart disease Maternal Grandmother    Alcohol abuse Maternal Grandfather    Gastric cancer Paternal Grandfather    Arthritis Paternal Grandfather    Social History   Socioeconomic History   Marital status: Married    Spouse name: Not on file   Number of children: Not on file   Years of education:  Not on file   Highest education level: GED or equivalent  Occupational History   Occupation: [employer]    Comment: assembly operator  Tobacco Use   Smoking status: Former    Current packs/day: 1.00    Average packs/day: 1 pack/day for 13.0 years (13.0 ttl pk-yrs)    Types: Cigarettes   Smokeless tobacco: Never  Vaping Use   Vaping status: Every Day  Substance and Sexual Activity   Alcohol use: Yes    Comment: beer occasionally   Drug use: No   Sexual activity: Yes    Birth control/protection: Surgical    Comment: BTL  Other Topics Concern   Not on file  Social History Narrative   3 biological    1 step    And two adopted (niece and nephew)   Social Drivers of Manufacturing engineer Strain: Low Risk  (08/09/2023)   Overall Financial Resource Strain (CARDIA)    Difficulty of Paying Living Expenses: Not hard at all  Food Insecurity: No Food Insecurity (08/09/2023)   Hunger Vital Sign    Worried About Running Out of Food in the Last Year: Never true    Ran Out of Food in the Last Year: Never true  Transportation Needs: No Transportation Needs (08/09/2023)   PRAPARE - Administrator, Civil Service (Medical): No    Lack of Transportation (Non-Medical): No  Physical Activity: Sufficiently Active (08/09/2023)   Exercise Vital Sign    Days of Exercise per Week: 5 days    Minutes of Exercise per Session: 90 min  Stress: No Stress Concern Present (08/09/2023)   [employer] of Occupational Health - Occupational Stress Questionnaire    Feeling of Stress : Not at all  Social Connections: Unknown (08/09/2023)   Social Connection and Isolation Panel [NHANES]    Frequency of Communication with Friends and Family: More than three times a week    Frequency of Social Gatherings with Friends and Family: More than three times a week    Attends Religious Services: Patient declined    Database administrator or Organizations: No    Attends Engineer, structural: Not on file    Marital Status: Married  Catering manager Violence: Not on file   Current Outpatient Medications on File Prior to Visit  Medication Sig Dispense Refill   acetaminophen (TYLENOL) 325 MG tablet Take 650 mg by mouth every 6 (six) hours as needed for moderate pain.     AIMOVIG 140 MG/ML SOAJ SMARTSIG:140 Milligram(s) SUB-Q Every 4 Weeks 1 mL 5   Azelastine-Fluticasone 137-50 MCG/ACT SUSP Place 1 spray into the nose every 12 (twelve) hours. 23 g 2   cetirizine (ZYRTEC) 10 MG tablet Take 10 mg by mouth daily.  5   cholestyramine (QUESTRAN) 4 g packet TAKE 1 PACKET BY MOUTH 2 TIMES DAILY. 180 packet 5   montelukast (SINGULAIR) 10 MG tablet TAKE 1 TABLET BY MOUTH EVERY  DAY 90 tablet 1   mupirocin ointment (BACTROBAN) 2 % Apply 1 Application topically 2 (two) times daily. 22 g 0   ondansetron (ZOFRAN) 4 MG tablet Take 1 tablet (4 mg total) by mouth every 6 (six) hours. 12 tablet 0   pantoprazole (PROTONIX) 40 MG tablet Take 1 tablet (40 mg total) by mouth 2 (two) times daily. 60 tablet 2   predniSONE (STERAPRED UNI-PAK 21 TAB) 10 MG (21) TBPK tablet Take as directed 1 each 0   PROAIR HFA 108 (90 Base) MCG/ACT  inhaler Inhale 2 puffs into the lungs every 6 (six) hours as needed for wheezing or shortness of breath.     rizatriptan (MAXALT) 5 MG tablet TAKE 1 TABLET BY MOUTH AS NEEDED FOR MIGRAINE. MAY REPEAT IN 2 HOURS IF NEEDED 10 tablet 0   Semaglutide-Weight Management (WEGOVY) 0.5 MG/0.5ML SOAJ After four weeks of 0.25 mg qweek, increase to 0.5 mg qweek 2 mL 2   No current facility-administered medications on file prior to visit.   Allergies  Allergen Reactions   Codeine Other (See Comments)    Pt. States she had a seizure   Latex     Review of Systems Pertinent items are noted in HPI.   Objective:   Blood pressure 113/64, pulse 74, height 5\' 5"  (1.651 m), weight 191 lb (86.6 kg), last menstrual period 10/03/2023. Body mass index is 31.78 kg/m. Physical Exam Vitals and nursing note reviewed. Exam conducted with a chaperone present.  Constitutional:      Appearance: Normal appearance.  HENT:     Head: Normocephalic and atraumatic.  Eyes:     Extraocular Movements: Extraocular movements intact.  Pulmonary:     Effort: Pulmonary effort is normal.  Abdominal:     Palpations: Abdomen is soft.  Genitourinary:    General: Normal vulva.     Vagina: Normal.     Cervix: Normal. No discharge, friability, lesion or erythema.     Uterus: Normal. Not enlarged and not tender.      Adnexa:        Right: No tenderness.         Left: No tenderness.    Musculoskeletal:        General: Normal range of motion.  Neurological:     General: No focal deficit  present.     Mental Status: She is alert.  Psychiatric:        Mood and Affect: Mood normal.    Assessment/Plan:   1. Abnormal uterine bleeding (AUB)   2. Menorrhagia with regular cycle   3. Pelvic pain   4. Cervical cancer screening   Discussed management options for abnormal uterine bleeding including expectant, NSAIDs, tranexamic acid (Lysteda), oral progesterone (Provera, norethindrone, megace), Depo Provera, Levonorgestrel containing IUD, endometrial ablation (Novasure) or hysterectomy as definitive surgical management.  Discussed risks and benefits of each method.   Final management decision will hinge on results of patient's work up and whether an underlying etiology for the patients bleeding symptoms can be discerned.  We will conduct a basic work up examining using the PALM-COIEN classification system.  In the meantime the patient opts to trial Lysteda while we await results of her ultrasound and labs. Pap due, obtained today. Bleeding precautions reviewed. Is expecting next period around 3/9. Sending for [place] at [facility], follow up 2 wks to review results and discuss treatment options.    Julieanne Manson, DO [facility]

## 2023-10-09 ENCOUNTER — Other Ambulatory Visit: Payer: Self-pay | Admitting: Family

## 2023-10-09 DIAGNOSIS — E6609 Other obesity due to excess calories: Secondary | ICD-10-CM

## 2023-10-11 ENCOUNTER — Encounter: Payer: Self-pay | Admitting: Family

## 2023-10-11 ENCOUNTER — Other Ambulatory Visit: Payer: Self-pay | Admitting: Family

## 2023-10-11 DIAGNOSIS — E6609 Other obesity due to excess calories: Secondary | ICD-10-CM

## 2023-10-11 DIAGNOSIS — R635 Abnormal weight gain: Secondary | ICD-10-CM

## 2023-10-11 MED ORDER — WEGOVY 1 MG/0.5ML ~~LOC~~ SOAJ: 1.0000 mg | SUBCUTANEOUS | 2 refills | Status: DC

## 2023-10-12 ENCOUNTER — Encounter: Payer: Self-pay | Admitting: Obstetrics

## 2023-10-12 LAB — CYTOLOGY - PAP
Chlamydia: NEGATIVE
Comment: NEGATIVE
Comment: NEGATIVE
Comment: NEGATIVE
Comment: NORMAL
Diagnosis: NEGATIVE
High risk HPV: NEGATIVE
Neisseria Gonorrhea: NEGATIVE
Trichomonas: NEGATIVE

## 2023-10-13 ENCOUNTER — Encounter: Payer: Self-pay | Admitting: Gastroenterology

## 2023-10-13 ENCOUNTER — Ambulatory Visit (INDEPENDENT_AMBULATORY_CARE_PROVIDER_SITE_OTHER): Payer: Medicaid Other | Admitting: Gastroenterology

## 2023-10-13 ENCOUNTER — Other Ambulatory Visit (INDEPENDENT_AMBULATORY_CARE_PROVIDER_SITE_OTHER): Payer: Medicaid Other

## 2023-10-13 VITALS — BP 112/76 | HR 57 | Temp 98.1°F | Ht 65.0 in | Wt 188.0 lb

## 2023-10-13 DIAGNOSIS — R635 Abnormal weight gain: Secondary | ICD-10-CM | POA: Diagnosis not present

## 2023-10-13 DIAGNOSIS — R1032 Left lower quadrant pain: Secondary | ICD-10-CM

## 2023-10-13 DIAGNOSIS — K581 Irritable bowel syndrome with constipation: Secondary | ICD-10-CM

## 2023-10-13 LAB — T4, FREE: Free T4: 0.54 ng/dL — ABNORMAL LOW (ref 0.60–1.60)

## 2023-10-13 LAB — TSH: TSH: 1.62 u[IU]/mL (ref 0.35–5.50)

## 2023-10-13 LAB — T3, FREE: T3, Free: 3.4 pg/mL (ref 2.3–4.2)

## 2023-10-13 NOTE — Progress Notes (Signed)
 Robyn Repress, MD [address]  [address]  [place], [place] [address]  Main: [phone]  Fax: [phone]    Gastroenterology Consultation  Referring Provider:     Mort Sawyers, FNP Primary Care Physician:  Mort Sawyers, FNP Primary Gastroenterologist:  Dr. Arlyss Ortiz Reason for Consultation: Left lower quadrant pain        HPI:   Robyn Ortiz is a 38 y.o. female referred by Mort Sawyers, FNP  for consultation & management of left lower quadrant pain.  She reports discomfort in left lower quadrant area that started in November, was evaluated by her PCP in December, underwent CT abdomen and pelvis with contrast which revealed fatty liver, sigmoid diverticulosis, with no other significant pathology.  She reports incomplete emptying although her stools are variable consistency sometimes soft to hard associated with some straining.  She drinks prune juice to move her bowels.  Although she is on Wegovy, she has been gaining weight.  She denies any abdominal bloating, nausea or vomiting.  She noticed blood on wiping this morning.  She denies family history of GI malignancy GI Procedures: EGD 06/05/2017 normal  EGD and colonoscopy 12/18/2021 - Normal duodenal bulb and second portion of the duodenum. Biopsied. - Erosive gastropathy with no bleeding and no stigmata of recent bleeding. Biopsied. - Normal gastric body. Biopsied. - Esophagogastric landmarks identified. - LA Grade A reflux esophagitis with no bleeding.  - The examined portion of the ileum was normal. - Erythematous mucosa in the sigmoid colon. Biopsied. - Normal mucosa in the descending colon and in the right colon. Biopsied. - The distal rectum and anal verge are normal on retroflexion view. - Diverticulosis in the sigmoid colon. DIAGNOSIS:  A. DUODENUM; COLD BIOPSY:  - ENTERIC MUCOSA WITH PRESERVED VILLOUS ARCHITECTURE AND NO SIGNIFICANT  HISTOPATHOLOGIC CHANGE.  - NEGATIVE FOR FEATURES OF CELIAC,  DYSPLASIA, AND MALIGNANCY.   B. STOMACH; COLD BIOPSY:  - GASTRIC ANTRAL AND OXYNTIC MUCOSA WITH FEATURES OF MILD REACTIVE  GASTROPATHY.  - NEGATIVE FOR H. PYLORI, DYSPLASIA, AND MALIGNANCY.   Comment:  The differential diagnosis for these findings includes drug/chemical  injury (NSAID vs. other), bile reflux, and changes adjacent to an area  of healing ulceration. Clinical correlation with endoscopic findings is  required.   C. COLON, RIGHT; COLD BIOPSY:  - BENIGN COLONIC MUCOSA WITH NO SIGNIFICANT HISTOPATHOLOGIC CHANGE.  - NEGATIVE FOR ACTIVE MUCOSAL COLITIS, FEATURES OF MICROSCOPIC COLITIS,  AND ARCHITECTURAL CHANGES OF CHRONICITY.  - NEGATIVE FOR DYSPLASIA AND MALIGNANCY.   D. COLON, DESCENDING; COLD BIOPSY:  - BENIGN COLONIC MUCOSA WITH NO SIGNIFICANT HISTOPATHOLOGIC CHANGE.  - NEGATIVE FOR ACTIVE MUCOSAL COLITIS, FEATURES OF MICROSCOPIC COLITIS,  AND ARCHITECTURAL CHANGES OF CHRONICITY.  - NEGATIVE FOR DYSPLASIA AND MALIGNANCY.   E. COLON ERYTHEMA, SIGMOID; COLD BIOPSY:  - BENIGN COLONIC MUCOSA WITH MILD LAMINA PROPRIA FIBROSIS AND  SUPERFICIAL REACTIVE/HYPERPLASTIC CHANGES.  - NEGATIVE FOR ACTIVE MUCOSAL COLITIS, DYSPLASIA, AND MALIGNANCY.   Past Medical History:  Diagnosis Date   Diverticulitis    GERD (gastroesophageal reflux disease)    History of bleeding ulcers    told not to take ibuprofen   History of bronchitis 37yrs ago   Migraine    Obesity    Sleep apnea    CPAP    Past Surgical History:  Procedure Laterality Date   CESAREAN SECTION N/A 04/08/2015   Procedure: CESAREAN SECTION;  Surgeon: Catalina Antigua, MD;  Location: [facility];  Service: Obstetrics;  Laterality: N/A;  CESAREAN SECTION WITH BILATERAL TUBAL LIGATION  04/08/2015   CHOLECYSTECTOMY  03/27/2014   Dr Lemar Livings   COLONOSCOPY WITH PROPOFOL N/A 12/18/2021   Procedure: COLONOSCOPY WITH PROPOFOL;  Surgeon: Toney Reil, MD;  Location: [facility] ENDOSCOPY;  Service: Gastroenterology;  Laterality:  N/A;   ESOPHAGOGASTRODUODENOSCOPY N/A 05/19/2017   Procedure: ESOPHAGOGASTRODUODENOSCOPY (EGD);  Surgeon: Toney Reil, MD;  Location: [facility];  Service: Endoscopy;  Laterality: N/A;   ESOPHAGOGASTRODUODENOSCOPY (EGD) WITH PROPOFOL N/A 12/18/2021   Procedure: ESOPHAGOGASTRODUODENOSCOPY (EGD) WITH PROPOFOL;  Surgeon: Toney Reil, MD;  Location: [facility] ENDOSCOPY;  Service: Gastroenterology;  Laterality: N/A;   GRADUATED/TOLERANCE TEST  03/06/2022   Good exercise tolerance-10:24 min.  Reached peak heart rate of 169 bpm equals 91% MPHR at 186 bpm.  No EKG changes, chest pain or dyspnea.   TONSILLECTOMY  03/10/2012   Procedure: TONSILLECTOMY;  Surgeon: Suzanna Obey, MD;  Location: [facility] OR;  Service: ENT;  Laterality: Bilateral;     Current Outpatient Medications:    acetaminophen (TYLENOL) 325 MG tablet, Take 650 mg by mouth every 6 (six) hours as needed for moderate pain., Disp: , Rfl:    AIMOVIG 140 MG/ML SOAJ, SMARTSIG:140 Milligram(s) SUB-Q Every 4 Weeks, Disp: 1 mL, Rfl: 5   Azelastine-Fluticasone 137-50 MCG/ACT SUSP, Place 1 spray into the nose every 12 (twelve) hours., Disp: 23 g, Rfl: 2   cetirizine (ZYRTEC) 10 MG tablet, Take 10 mg by mouth daily., Disp: , Rfl: 5   cholestyramine (QUESTRAN) 4 g packet, TAKE 1 PACKET BY MOUTH 2 TIMES DAILY., Disp: 180 packet, Rfl: 5   montelukast (SINGULAIR) 10 MG tablet, TAKE 1 TABLET BY MOUTH EVERY DAY, Disp: 90 tablet, Rfl: 1   mupirocin ointment (BACTROBAN) 2 %, Apply 1 Application topically 2 (two) times daily., Disp: 22 g, Rfl: 0   ondansetron (ZOFRAN) 4 MG tablet, Take 1 tablet (4 mg total) by mouth every 6 (six) hours., Disp: 12 tablet, Rfl: 0   pantoprazole (PROTONIX) 40 MG tablet, Take 1 tablet (40 mg total) by mouth 2 (two) times daily., Disp: 60 tablet, Rfl: 2   PROAIR HFA 108 (90 Base) MCG/ACT inhaler, Inhale 2 puffs into the lungs every 6 (six) hours as needed for wheezing or shortness of breath., Disp: , Rfl:     rizatriptan (MAXALT) 5 MG tablet, TAKE 1 TABLET BY MOUTH AS NEEDED FOR MIGRAINE. MAY REPEAT IN 2 HOURS IF NEEDED, Disp: 10 tablet, Rfl: 0   Semaglutide-Weight Management (WEGOVY) 1 MG/0.5ML SOAJ, Inject 1 mg into the skin once a week., Disp: 2 mL, Rfl: 2   tranexamic acid (LYSTEDA) 650 MG TABS tablet, Take 2 tablets (1,300 mg total) by mouth 3 (three) times daily. Take with food. Take during menses for a maximum of five days, Disp: 30 tablet, Rfl: 0   Family History  Problem Relation Age of Onset   Bipolar disorder Mother    Schizophrenia Mother    Diabetes Father    Heart disease Sister    Suicidality Brother    Heart disease Maternal Grandmother    Alcohol abuse Maternal Grandfather    Gastric cancer Paternal Grandfather    Arthritis Paternal Grandfather      Social History   Tobacco Use   Smoking status: Former    Current packs/day: 1.00    Average packs/day: 1 pack/day for 13.0 years (13.0 ttl pk-yrs)    Types: Cigarettes   Smokeless tobacco: Never  Vaping Use   Vaping status: Every Day  Substance Use Topics  Alcohol use: Yes    Comment: beer occasionally   Drug use: No    Allergies as of 10/13/2023 - Review Complete 10/13/2023  Allergen Reaction Noted   Codeine Other (See Comments) 01/26/2012   Latex  08/12/2021    Review of Systems:    All systems reviewed and negative except where noted in HPI.   Physical Exam:  BP 112/76 (BP Location: Left Arm, Patient Position: Sitting, Cuff Size: Normal)   Pulse (!) 57   Temp 98.1 F (36.7 C) (Oral)   Ht 5\' 5"  (1.651 m)   Wt 188 lb (85.3 kg)   LMP 10/03/2023   BMI 31.28 kg/m  Patient's last menstrual period was 10/03/2023.  General:   Alert,  Well-developed, well-nourished, pleasant and cooperative in NAD Head:  Normocephalic and atraumatic. Eyes:  Sclera clear, no icterus.   Conjunctiva pink. Ears:  Normal auditory acuity. Nose:  No deformity, discharge, or lesions. Mouth:  No deformity or lesions,oropharynx  pink & moist. Neck:  Supple; no masses or thyromegaly. Lungs:  Respirations even and unlabored.  Clear throughout to auscultation.   No wheezes, crackles, or rhonchi. No acute distress. Heart:  Regular rate and rhythm; no murmurs, clicks, rubs, or gallops. Abdomen:  Normal bowel sounds.  No bruits.  Soft, non-tender and non-distended without masses, hepatosplenomegaly or hernias noted.  No guarding or rebound tenderness.   Rectal: Normal perianal exam, nontender digital rectal exam Msk:  Symmetrical without gross deformities. Good, equal movement & strength bilaterally. Pulses:  Normal pulses noted. Extremities:  No clubbing or edema.  No cyanosis. Neurologic:  Alert and oriented x3;  grossly normal neurologically. Skin:  Intact without significant lesions or rashes. No jaundice. Psych:  Alert and cooperative. Normal mood and affect.  Imaging Studies: Reviewed  Assessment and Plan:   Driana Sedam is a 38 y.o. female status postcholecystectomy with chronic diarrhea, diagnosed with C. difficile infection in October 2022, s/p treatment with oral vancomycin.  EGD and colonoscopy with biopsies were unremarkable for chronic diarrhea.  Diarrhea has resolved with cholestyramine. She then had constipation, rectal bleeding, underwent hemorrhoid ligation.  Constipation has resolved along with rectal bleeding.  Patient was previously on phentermine for weight loss for 6 months, lost weight down to 170s and was feeling good.  Phentermine has been discontinued, started regaining weight, Reginal Lutes was started in 04/2023 with weight loss.  Patient is here for follow-up of left lower quadrant discomfort   Left lower quadrant discomfort likely secondary to irregular bowel movements Differentials include IBS constipation, with history of sigmoid diverticulosis, SCAD is a possibility Recommend trial of IBS Rela, samples provided  Follow up as needed contact me via my chart as needed  Robyn Repress, MD

## 2023-10-13 NOTE — Patient Instructions (Signed)
 Gave samples of Ibsrela take 1 tablet twice a day

## 2023-10-14 ENCOUNTER — Ambulatory Visit
Admission: RE | Admit: 2023-10-14 | Discharge: 2023-10-14 | Disposition: A | Discharge status: Home / Self Care | Payer: Medicaid Other | Source: Ambulatory Visit | Attending: Obstetrics | Admitting: Obstetrics

## 2023-10-14 DIAGNOSIS — N939 Abnormal uterine and vaginal bleeding, unspecified: Secondary | ICD-10-CM | POA: Diagnosis present

## 2023-10-14 DIAGNOSIS — R102 Pelvic and perineal pain: Secondary | ICD-10-CM | POA: Diagnosis present

## 2023-10-14 DIAGNOSIS — N920 Excessive and frequent menstruation with regular cycle: Secondary | ICD-10-CM | POA: Insufficient documentation

## 2023-10-19 ENCOUNTER — Encounter: Payer: Self-pay | Admitting: Family

## 2023-10-23 ENCOUNTER — Other Ambulatory Visit: Payer: Self-pay | Admitting: Family

## 2023-10-23 DIAGNOSIS — G43101 Migraine with aura, not intractable, with status migrainosus: Secondary | ICD-10-CM

## 2023-10-25 ENCOUNTER — Telehealth: Payer: Self-pay | Admitting: Family

## 2023-10-25 NOTE — Telephone Encounter (Signed)
 Patient rescheduled.

## 2023-10-25 NOTE — Telephone Encounter (Signed)
-----   Message from Alvina Chou sent at 10/25/2023  9:59 AM EDT ----- Regarding: Lab orders for Wednesday, 3.26.25 Lab orders, thanks

## 2023-10-25 NOTE — Telephone Encounter (Signed)
 Pt is supposed to come in office for a follow up appt and we'll repeat labs at that visit. Can you please schedule her for in office appt and cancel lab appt?  Terri just an fyi I'm sending this to admin.

## 2023-10-28 ENCOUNTER — Telehealth (INDEPENDENT_AMBULATORY_CARE_PROVIDER_SITE_OTHER): Payer: Self-pay | Admitting: Otolaryngology

## 2023-10-28 NOTE — Telephone Encounter (Signed)
 Called to confirm appt and address for 10/29/2023, vm box full

## 2023-10-29 ENCOUNTER — Ambulatory Visit (INDEPENDENT_AMBULATORY_CARE_PROVIDER_SITE_OTHER): Payer: Medicaid Other

## 2023-11-02 ENCOUNTER — Other Ambulatory Visit: Payer: Self-pay | Admitting: Family

## 2023-11-03 ENCOUNTER — Other Ambulatory Visit: Payer: Self-pay | Admitting: Neurology

## 2023-11-03 DIAGNOSIS — R519 Headache, unspecified: Secondary | ICD-10-CM

## 2023-11-04 ENCOUNTER — Encounter: Payer: Self-pay | Admitting: Obstetrics

## 2023-11-04 ENCOUNTER — Ambulatory Visit: Admitting: Obstetrics

## 2023-11-04 VITALS — BP 109/64 | HR 69 | Ht 65.0 in | Wt 186.0 lb

## 2023-11-04 DIAGNOSIS — N920 Excessive and frequent menstruation with regular cycle: Secondary | ICD-10-CM | POA: Diagnosis not present

## 2023-11-04 DIAGNOSIS — B001 Herpesviral vesicular dermatitis: Secondary | ICD-10-CM

## 2023-11-04 MED ORDER — NORETHIN ACE-ETH ESTRAD-FE 1.5-30 MG-MCG PO TABS: 1.0000 | ORAL_TABLET | Freq: Every day | ORAL | 4 refills | Status: DC

## 2023-11-04 MED ORDER — VALACYCLOVIR HCL 1 G PO TABS: ORAL_TABLET | ORAL | 1 refills | Status: DC

## 2023-11-04 NOTE — Addendum Note (Signed)
 Addended by: Julieanne Manson on: 11/04/2023 05:10 PM   Modules accepted: Orders

## 2023-11-04 NOTE — Telephone Encounter (Signed)
 Prescription has been sent.

## 2023-11-04 NOTE — Progress Notes (Addendum)
    GYNECOLOGY PROGRESS NOTE  Subjective:  PCP: Mort Sawyers, FNP  Patient ID: Robyn Ortiz, female    DOB: 1986-04-20, 38 y.o.   MRN: [id number]  HPI  Patient is a 38 y.o. [id number] female who presents for f/up for AUB/HMB. We last saw pt on 10/08/23 and ordered an [place], pt was given Lysteda to help with next cycle while awaiting results.  [place] on 3/15 was unremarkable, EMT 11mm. Lysteda did not lighten her period, feels it made it last longer. Usually is 7 days, LMP 10/16/23 and lasted 10 days, did not feel bleeding was any lighter. Has failed the IUD (unsure if was hormonal or non-hormonal), Depo, and does not yet desire a procedure. Is s/p BTL.  Pt also with cold sore on bottom lip at R commissure, started 2 days ago. Gets frequently, has never taken medication for them, uses Abreva which doesn't help much.   The following portions of the patient's history were reviewed and updated as appropriate: allergies, current medications, past family history, past medical history, past social history, past surgical history, and problem list.  Review of Systems Pertinent items are noted in HPI.   Objective:   Blood pressure 109/64, pulse 69, height 5\' 5"  (1.651 m), weight 186 lb (84.4 kg), last menstrual period 10/16/2023. Body mass index is 30.95 kg/m.  General appearance: alert, cooperative, appears stated age, and no distress Extremities: extremities normal, atraumatic, no cyanosis or edema Neurologic: Grossly normal  EXAM: TRANSABDOMINAL AND TRANSVAGINAL ULTRASOUND OF PELVIS   TECHNIQUE: Both transabdominal and transvaginal ultrasound examinations of the pelvis were performed. Transabdominal technique was performed for global imaging of the pelvis including uterus, ovaries, adnexal regions, and pelvic cul-de-sac. It was necessary to proceed with endovaginal exam following the transabdominal exam to visualize the endometrium.   COMPARISON:  CT abdomen pelvis 07/23/2023    FINDINGS: Uterus   Measurements: 9.7 x 5.2 x 5.0 cm = volume: 131.1 mL. No fibroids or other mass visualized.   Endometrium   Thickness: 11.7 mm.  No focal abnormality visualized.   Right ovary   Measurements: 3.7 x 2.2 x 3.4 cm = volume: 19.6 mL. Probable right ovarian corpus luteum.   Left ovary   Measurements: 3.0 x 1.2 x 1.9 cm = volume: 2.5 mL. Normal appearance/no adnexal mass.   Other findings   No abnormal free fluid.   IMPRESSION: No acute process within the pelvis.  Assessment/Plan:   1. Menorrhagia with regular cycle   2. Recurrent cold sores    Robyn Ortiz is a 38 y.o. [id number] s/p BTL with HMB, refractory to an IUD and Depo.  -We discussed treatment options including estrogen/progesterone contraception, retrying Mirena IUD, endometrial ablation, [place], and hysterectomy. Pt desires to start with COCs.  -Rx for Junel 1.5/30 sent, will take in a cyclical fashion for the first 3 mos while thinning the lining, we discussed eventually working up to amenorrhea/suppression. -Side effects and dosing reviewed  Cold sores:  -Rx for Valtrex, episodic dosing   RTC 3 mos for f/u, sooner prn   Robyn Manson, DO  [facility]

## 2023-11-05 ENCOUNTER — Ambulatory Visit: Admission: EM | Admit: 2023-11-05 | Discharge: 2023-11-05 | Disposition: A | Discharge status: Home / Self Care

## 2023-11-05 ENCOUNTER — Encounter: Payer: Self-pay | Admitting: Emergency Medicine

## 2023-11-05 DIAGNOSIS — H9202 Otalgia, left ear: Secondary | ICD-10-CM

## 2023-11-05 NOTE — Discharge Instructions (Addendum)
 Take Tylenol or ibuprofen as needed for fever or discomfort.  Follow-up with your primary care provider if your symptoms are not improving.

## 2023-11-05 NOTE — ED Provider Notes (Signed)
 Robyn Ortiz    CSN: [id number] Arrival date & time: 11/05/23  1450      History   Chief Complaint No chief complaint on file.   HPI Robyn Ortiz is a 38 y.o. female.  Patient presents with 1 day history of ear pain.  She is concerned for possible foreign body.  No fever, ear drainage, sore throat, cough.  No trauma.  No OTC medication taken today.   The history is provided by the patient and medical records.    Past Medical History:  Diagnosis Date   Diverticulitis    GERD (gastroesophageal reflux disease)    History of bleeding ulcers    told not to take ibuprofen   History of bronchitis 20yrs ago   Migraine    Obesity    Sleep apnea    CPAP    Patient Active Problem List   Diagnosis Date Noted   Menorrhagia with regular cycle 10/08/2023   Abnormal uterine bleeding (AUB) 10/08/2023   Chronic rhinitis 09/25/2023   Deviated nasal septum 09/25/2023   Hypertrophy of nasal turbinates 09/25/2023   Pelvic pain 08/13/2023   Recurrent sinusitis 08/13/2023   Allergic rhinitis 08/13/2023   Gastroesophageal reflux disease with esophagitis without hemorrhage 08/13/2023   Class 1 obesity due to excess calories without serious comorbidity with body mass index (BMI) of 30.0 to 30.9 in adult 03/26/2023   Vapes nicotine containing substance 02/11/2023   GAD (generalized anxiety disorder) 02/11/2023   Eczema 09/04/2014   Asthma 11/21/2012   Ex-smoker 11/21/2012   Condyloma acuminatum of perianal region 11/21/2012   Migraine 11/21/2012    Past Surgical History:  Procedure Laterality Date   CESAREAN SECTION N/A 04/08/2015   Procedure: CESAREAN SECTION;  Surgeon: Catalina Antigua, MD;  Location: [facility];  Service: Obstetrics;  Laterality: N/A;   CESAREAN SECTION WITH BILATERAL TUBAL LIGATION  04/08/2015   CHOLECYSTECTOMY  03/27/2014   Dr Lemar Livings   COLONOSCOPY WITH PROPOFOL N/A 12/18/2021   Procedure: COLONOSCOPY WITH PROPOFOL;  Surgeon: Toney Reil, MD;   Location: [facility] ENDOSCOPY;  Service: Gastroenterology;  Laterality: N/A;   ESOPHAGOGASTRODUODENOSCOPY N/A 05/19/2017   Procedure: ESOPHAGOGASTRODUODENOSCOPY (EGD);  Surgeon: Toney Reil, MD;  Location: [facility] SURGERY [facility];  Service: Endoscopy;  Laterality: N/A;   ESOPHAGOGASTRODUODENOSCOPY (EGD) WITH PROPOFOL N/A 12/18/2021   Procedure: ESOPHAGOGASTRODUODENOSCOPY (EGD) WITH PROPOFOL;  Surgeon: Toney Reil, MD;  Location: [facility] ENDOSCOPY;  Service: Gastroenterology;  Laterality: N/A;   GRADUATED/TOLERANCE TEST  03/06/2022   Good exercise tolerance-10:24 min.  Reached peak heart rate of 169 bpm equals 91% MPHR at 186 bpm.  No EKG changes, chest pain or dyspnea.   TONSILLECTOMY  03/10/2012   Procedure: TONSILLECTOMY;  Surgeon: Suzanna Obey, MD;  Location: [facility] OR;  Service: ENT;  Laterality: Bilateral;    OB History     Gravida  3   Para  3   Term  3   Preterm      AB      Living  3      SAB      IAB      Ectopic      Multiple  0   Live Births  3        Obstetric Comments  1st Menstrual Cycle:  14  1st Pregnancy:  17          Home Medications    Prior to Admission medications   Medication Sig Start Date End Date Taking? Authorizing Provider  acetaminophen (TYLENOL) 325  MG tablet Take 650 mg by mouth every 6 (six) hours as needed for moderate pain.    [provider]  AIMOVIG 140 MG/ML SOAJ SMARTSIG:140 Milligram(s) SUB-Q Every 4 Weeks 03/26/23   Mort Sawyers, FNP  Azelastine-Fluticasone 137-50 MCG/ACT SUSP Place 1 spray into the nose every 12 (twelve) hours. 08/13/23   Mort Sawyers, FNP  cetirizine (ZYRTEC) 10 MG tablet TAKE 1 TABLET BY MOUTH EVERY DAY 11/02/23   Mort Sawyers, FNP  cholestyramine (QUESTRAN) 4 g packet TAKE 1 PACKET BY MOUTH 2 TIMES DAILY. 05/20/23   Toney Reil, MD  montelukast (SINGULAIR) 10 MG tablet TAKE 1 TABLET BY MOUTH EVERY DAY 08/13/23   Mort Sawyers, FNP  mupirocin ointment (BACTROBAN) 2 % Apply 1  Application topically 2 (two) times daily. 07/23/23   Mort Sawyers, FNP  norethindrone-ethinyl estradiol-iron (JUNEL FE 1.5/30) 1.5-30 MG-MCG tablet Take 1 tablet by mouth daily. 11/04/23   Julieanne Manson, MD  ondansetron (ZOFRAN) 4 MG tablet Take 1 tablet (4 mg total) by mouth every 6 (six) hours. 07/15/23   Coral Spikes, DO  pantoprazole (PROTONIX) 40 MG tablet Take 1 tablet (40 mg total) by mouth 2 (two) times daily. 08/13/23   Mort Sawyers, FNP  PROAIR HFA 108 (90 Base) MCG/ACT inhaler Inhale 2 puffs into the lungs every 6 (six) hours as needed for wheezing or shortness of breath. 02/11/23   Mort Sawyers, FNP  rizatriptan (MAXALT) 5 MG tablet TAKE 1 TABLET BY MOUTH AS NEEDED FOR MIGRAINE. MAY REPEAT IN 2 HOURS IF NEEDED 10/25/23   Mort Sawyers, FNP  Semaglutide-Weight Management (WEGOVY) 1 MG/0.5ML SOAJ Inject 1 mg into the skin once a week. 10/11/23   Mort Sawyers, FNP  valACYclovir (VALTREX) 1000 MG tablet Take 2 tabs (2000 mg) by mouth at onset of first symptoms of cold sore. Take 2 tabs again 12hrs later, then stop. 11/04/23   Julieanne Manson, MD    Family History Family History  Problem Relation Age of Onset   Bipolar disorder Mother    Schizophrenia Mother    Diabetes Father    Heart disease Sister    Suicidality Brother    Heart disease Maternal Grandmother    Alcohol abuse Maternal Grandfather    Gastric cancer Paternal Grandfather    Arthritis Paternal Grandfather     Social History Social History   Tobacco Use   Smoking status: Former    Current packs/day: 1.00    Average packs/day: 1 pack/day for 13.0 years (13.0 ttl pk-yrs)    Types: Cigarettes   Smokeless tobacco: Never  Vaping Use   Vaping status: Every Day  Substance Use Topics   Alcohol use: Yes    Comment: beer occasionally   Drug use: No     Allergies   Codeine and Latex   Review of Systems Review of Systems  Constitutional:  Negative for chills and fever.  HENT:  Positive for ear pain. Negative  for ear discharge and sore throat.   Respiratory:  Negative for cough and shortness of breath.      Physical Exam Triage Vital Signs ED Triage Vitals  Encounter Vitals Group     BP      Systolic BP Percentile      Diastolic BP Percentile      Pulse      Resp      Temp      Temp src      SpO2      Weight  Height      Head Circumference      Peak Flow      Pain Score      Pain Loc      Pain Education      Exclude from Growth Chart    No data found.  Updated Vital Signs BP 104/70   Pulse 74   Temp 98.2 F (36.8 C)   Resp 18   LMP 10/16/2023   SpO2 96%   Visual Acuity Right Eye Distance:   Left Eye Distance:   Bilateral Distance:    Right Eye Near:   Left Eye Near:    Bilateral Near:     Physical Exam Constitutional:      General: She is not in acute distress. HENT:     Right Ear: Tympanic membrane and ear canal normal.     Left Ear: Tympanic membrane and ear canal normal.     Nose: Nose normal.     Mouth/Throat:     Mouth: Mucous membranes are moist.     Pharynx: Oropharynx is clear.  Cardiovascular:     Rate and Rhythm: Normal rate and regular rhythm.     Heart sounds: Normal heart sounds.  Pulmonary:     Effort: Pulmonary effort is normal. No respiratory distress.     Breath sounds: Normal breath sounds.  Neurological:     Mental Status: She is alert.      UC Treatments / Results  Labs (all labs ordered are listed, but only abnormal results are displayed) Labs Reviewed - No data to display  EKG   Radiology No results found.  Procedures Procedures (including critical care time)  Medications Ordered in UC Medications - No data to display  Initial Impression / Assessment and Plan / UC Course  I have reviewed the triage vital signs and the nursing notes.  Pertinent labs & imaging results that were available during my care of the patient were reviewed by me and considered in my medical decision making (see chart for details).     Left otalgia.  No foreign body noted on exam.  Afebrile and vital signs are stable.  Discussed symptomatic treatment for ear pain including Tylenol or ibuprofen as needed.  Education provided on adult earache.  Instructed patient to follow up with her PCP if her symptoms are not improving.  She agrees to plan of care.   Final Clinical Impressions(s) / UC Diagnoses   Final diagnoses:  Otalgia, left     Discharge Instructions      Take Tylenol or ibuprofen as needed for fever or discomfort.      Follow-up with your primary care provider if your symptoms are not improving.         ED Prescriptions   None    PDMP not reviewed this encounter.   Mickie Bail, NP 11/05/23 [phone]

## 2023-11-06 ENCOUNTER — Ambulatory Visit
Admission: RE | Admit: 2023-11-06 | Discharge: 2023-11-06 | Disposition: A | Discharge status: Home / Self Care | Source: Ambulatory Visit | Attending: Neurology | Admitting: Neurology

## 2023-11-06 DIAGNOSIS — R519 Headache, unspecified: Secondary | ICD-10-CM | POA: Insufficient documentation

## 2023-11-10 ENCOUNTER — Other Ambulatory Visit

## 2023-11-12 ENCOUNTER — Encounter: Payer: Self-pay | Admitting: Family

## 2023-11-12 ENCOUNTER — Ambulatory Visit (INDEPENDENT_AMBULATORY_CARE_PROVIDER_SITE_OTHER): Admitting: Family

## 2023-11-12 VITALS — BP 120/74 | HR 65 | Temp 98.4°F | Ht 65.0 in | Wt 188.0 lb

## 2023-11-12 DIAGNOSIS — R400 Somnolence: Secondary | ICD-10-CM | POA: Insufficient documentation

## 2023-11-12 DIAGNOSIS — Z13220 Encounter for screening for lipoid disorders: Secondary | ICD-10-CM

## 2023-11-12 DIAGNOSIS — R112 Nausea with vomiting, unspecified: Secondary | ICD-10-CM | POA: Diagnosis not present

## 2023-11-12 DIAGNOSIS — E66811 Obesity, class 1: Secondary | ICD-10-CM | POA: Diagnosis not present

## 2023-11-12 DIAGNOSIS — E6609 Other obesity due to excess calories: Secondary | ICD-10-CM

## 2023-11-12 DIAGNOSIS — Z6830 Body mass index (BMI) 30.0-30.9, adult: Secondary | ICD-10-CM

## 2023-11-12 DIAGNOSIS — R7989 Other specified abnormal findings of blood chemistry: Secondary | ICD-10-CM

## 2023-11-12 DIAGNOSIS — H9201 Otalgia, right ear: Secondary | ICD-10-CM | POA: Insufficient documentation

## 2023-11-12 DIAGNOSIS — J301 Allergic rhinitis due to pollen: Secondary | ICD-10-CM | POA: Diagnosis not present

## 2023-11-12 LAB — BASIC METABOLIC PANEL WITH GFR
BUN: 11 mg/dL (ref 6–23)
CO2: 27 meq/L (ref 19–32)
Calcium: 9 mg/dL (ref 8.4–10.5)
Chloride: 108 meq/L (ref 96–112)
Creatinine, Ser: 0.56 mg/dL (ref 0.40–1.20)
GFR: 117.18 mL/min (ref >60.00)
Glucose, Bld: 73 mg/dL (ref 70–99)
Potassium: 4.6 meq/L (ref 3.5–5.1)
Sodium: 141 meq/L (ref 135–145)

## 2023-11-12 LAB — LIPID PANEL
Cholesterol: 147 mg/dL (ref 0–200)
HDL: 56.1 mg/dL (ref >39.00)
LDL Cholesterol: 80 mg/dL (ref 0–99)
NonHDL: 90.5
Total CHOL/HDL Ratio: 3
Triglycerides: 55 mg/dL (ref 0.0–149.0)
VLDL: 11 mg/dL (ref 0.0–40.0)

## 2023-11-12 LAB — T4, FREE: Free T4: 0.62 ng/dL (ref 0.60–1.60)

## 2023-11-12 LAB — T3, FREE: T3, Free: 3.3 pg/mL (ref 2.3–4.2)

## 2023-11-12 LAB — TSH: TSH: 1.49 u[IU]/mL (ref 0.35–5.50)

## 2023-11-12 MED ORDER — LEVOCETIRIZINE DIHYDROCHLORIDE 5 MG PO TABS: 5.0000 mg | ORAL_TABLET | Freq: Every evening | ORAL | 3 refills | Status: AC

## 2023-11-12 MED ORDER — NEOMYCIN-POLYMYXIN-HC 3.5-10000-1 OT SOLN
3.0000 [drp] | Freq: Four times a day (QID) | OTIC | 0 refills | Status: AC
Start: 2023-11-12 — End: 2023-11-19

## 2023-11-12 NOTE — Assessment & Plan Note (Signed)
 Labs wnl  Pt at this time denies depressive symptoms Sleep study ordered and pending with neurology  Could be suspicious for osa

## 2023-11-12 NOTE — Patient Instructions (Addendum)
  Stop zyrtec start xyzal Restart your nose spray.  Continue singulair.

## 2023-11-12 NOTE — Assessment & Plan Note (Signed)
 Restart nose spray  Stop zyrtec start xyzal

## 2023-11-12 NOTE — Assessment & Plan Note (Signed)
 Cont wegovy  However did advise pt to try to eat less fried fatty foods as this can increase n/v Did advise pt however if this becomes more frequent and or problematic we may need to d/c wegovy

## 2023-11-12 NOTE — Progress Notes (Signed)
 Established Patient Office Visit  Subjective:      CC:  Chief Complaint  Patient presents with   Medical Management of Chronic Issues    HPI: Robyn Ortiz is a 38 y.o. female presenting on 11/12/2023 for Medical Management of Chronic Issues  Right ear otalgia, went to urgent care 3/21 as she felt that something was inside of her ear and was really itchy they didn't see anything on physical exam and they recommended tylenol and ibuprofen which she states does not help at all. She also has reports of sinus pressure and runny nose as well as itchy sore throat. Slight dry cough. She takes singulair she is on zyrtec but has been on this for a long time.   Obesity: on wegovy 1 mg weekly and she is not losing weight.  Exercise: active with the kids, baseball with kids  Diet: trying to stay away from carbs, has cut down on them slightly. She does admit to eating carbs again. Gas more than normal and nausea more than normal. She does have constipation here and there. Has vomited twice since starting the wegovy. She does admit to fast food at times which makes these symptoms worse. Her daughter is getting married in September and she wants to lose weight for the wedding.   Wt Readings from Last 3 Encounters:  11/12/23 188 lb (85.3 kg)  11/04/23 186 lb (84.4 kg)  10/13/23 188 lb (85.3 kg)    New complaints: Feels really tired throughout the day. Restless at night will toss and turn. Will wake up a lot at night. She does snore. She does have a sleep study pending.      Social history:  Relevant past medical, surgical, family and social history reviewed and updated as indicated. Interim medical history since our last visit reviewed.  Allergies and medications reviewed and updated.  DATA REVIEWED: CHART IN EPIC     ROS: Negative unless specifically indicated above in HPI.    Current Outpatient Medications:    acetaminophen (TYLENOL) 325 MG tablet, Take 650 mg by mouth every 6  (six) hours as needed for moderate pain., Disp: , Rfl:    AIMOVIG 140 MG/ML SOAJ, SMARTSIG:140 Milligram(s) SUB-Q Every 4 Weeks, Disp: 1 mL, Rfl: 5   Azelastine-Fluticasone 137-50 MCG/ACT SUSP, Place 1 spray into the nose every 12 (twelve) hours., Disp: 23 g, Rfl: 2   cholestyramine (QUESTRAN) 4 g packet, TAKE 1 PACKET BY MOUTH 2 TIMES DAILY., Disp: 180 packet, Rfl: 5   levocetirizine (XYZAL) 5 MG tablet, Take 1 tablet (5 mg total) by mouth every evening., Disp: 90 tablet, Rfl: 3   montelukast (SINGULAIR) 10 MG tablet, TAKE 1 TABLET BY MOUTH EVERY DAY, Disp: 90 tablet, Rfl: 1   mupirocin ointment (BACTROBAN) 2 %, Apply 1 Application topically 2 (two) times daily., Disp: 22 g, Rfl: 0   neomycin-polymyxin-hydrocortisone (CORTISPORIN) OTIC solution, Place 3 drops into the right ear 4 (four) times daily for 7 days., Disp: 10 mL, Rfl: 0   norethindrone-ethinyl estradiol-iron (JUNEL FE 1.5/30) 1.5-30 MG-MCG tablet, Take 1 tablet by mouth daily., Disp: 84 tablet, Rfl: 4   ondansetron (ZOFRAN) 4 MG tablet, Take 1 tablet (4 mg total) by mouth every 6 (six) hours., Disp: 12 tablet, Rfl: 0   pantoprazole (PROTONIX) 40 MG tablet, Take 1 tablet (40 mg total) by mouth 2 (two) times daily., Disp: 60 tablet, Rfl: 2   PROAIR HFA 108 (90 Base) MCG/ACT inhaler, Inhale 2 puffs into the lungs every 6 (  six) hours as needed for wheezing or shortness of breath., Disp: , Rfl:    rizatriptan (MAXALT) 5 MG tablet, TAKE 1 TABLET BY MOUTH AS NEEDED FOR MIGRAINE. MAY REPEAT IN 2 HOURS IF NEEDED, Disp: 10 tablet, Rfl: 0   Semaglutide-Weight Management (WEGOVY) 1 MG/0.5ML SOAJ, Inject 1 mg into the skin once a week., Disp: 2 mL, Rfl: 2   valACYclovir (VALTREX) 1000 MG tablet, Take 2 tabs (2000 mg) by mouth at onset of first symptoms of cold sore. Take 2 tabs again 12hrs later, then stop., Disp: 30 tablet, Rfl: 1      Objective:    BP 120/74 (BP Location: Left Arm, Patient Position: Sitting, Cuff Size: Normal)   Pulse 65   Temp  98.4 F (36.9 C) (Temporal)   Ht 5\' 5"  (1.651 m)   Wt 188 lb (85.3 kg)   LMP 10/16/2023   SpO2 98%   BMI 31.28 kg/m   Wt Readings from Last 3 Encounters:  11/12/23 188 lb (85.3 kg)  11/04/23 186 lb (84.4 kg)  10/13/23 188 lb (85.3 kg)    Physical Exam Constitutional:      General: She is not in acute distress.    Appearance: Normal appearance. She is normal weight. She is not ill-appearing, toxic-appearing or diaphoretic.  HENT:     Head: Normocephalic.     Right Ear: Tympanic membrane normal. There is no impacted cerumen. Tympanic membrane is not perforated or erythematous.     Left Ear: Tympanic membrane normal.     Ears:     Comments: Slight ear canal inflammation      Nose: Rhinorrhea present. No congestion.     Right Turbinates: Swollen.     Left Turbinates: Swollen.     Mouth/Throat:     Mouth: Mucous membranes are dry.     Pharynx: No oropharyngeal exudate or posterior oropharyngeal erythema.  Eyes:     Extraocular Movements: Extraocular movements intact.     Pupils: Pupils are equal, round, and reactive to light.  Cardiovascular:     Rate and Rhythm: Normal rate and regular rhythm.     Pulses: Normal pulses.     Heart sounds: Normal heart sounds.  Pulmonary:     Effort: Pulmonary effort is normal.     Breath sounds: Normal breath sounds.  Musculoskeletal:     Cervical back: Normal range of motion.  Neurological:     General: No focal deficit present.     Mental Status: She is alert and oriented to person, place, and time. Mental status is at baseline.  Psychiatric:        Mood and Affect: Mood normal.        Behavior: Behavior normal.        Thought Content: Thought content normal.        Judgment: Judgment normal.           Assessment & Plan:  Seasonal allergic rhinitis due to pollen Assessment & Plan: Restart nose spray  Stop zyrtec start xyzal    Orders: -     Levocetirizine Dihydrochloride; Take 1 tablet (5 mg total) by mouth every  evening.  Dispense: 90 tablet; Refill: 3  Class 1 obesity due to excess calories without serious comorbidity with body mass index (BMI) of 30.0 to 30.9 in adult Assessment & Plan: Cont wegovy  However did advise pt to try to eat less fried fatty foods as this can increase n/v Did advise pt however if this becomes more frequent  and or problematic we may need to d/c wegovy   Orders: -     Amb Ref to Medical Weight Management  Abnormal serum thyroxine (T4) level Assessment & Plan: Repeat today pending results   Orders: -     Thyroid Peroxidase Antibodies (TPO) (REFL) -     T3, free -     T4, free -     TSH  Nausea and vomiting, unspecified vomiting type -     Basic metabolic panel with GFR  Screening for lipoid disorders -     Lipid panel  Otalgia of right ear Assessment & Plan: Ear drops for slight ear canal inflammation    Orders: -     Neomycin-Polymyxin-HC; Place 3 drops into the right ear 4 (four) times daily for 7 days.  Dispense: 10 mL; Refill: 0  Daytime sleepiness Assessment & Plan: Labs wnl  Pt at this time denies depressive symptoms Sleep study ordered and pending with neurology  Could be suspicious for osa      Return in about 6 months (around 05/14/2024) for f/u CPE.  Mort Sawyers, MSN, APRN, FNP-C [facility]

## 2023-11-12 NOTE — Assessment & Plan Note (Signed)
 Ear drops for slight ear canal inflammation

## 2023-11-12 NOTE — Assessment & Plan Note (Signed)
 Repeat today pending results.

## 2023-11-15 ENCOUNTER — Encounter (INDEPENDENT_AMBULATORY_CARE_PROVIDER_SITE_OTHER): Payer: Self-pay

## 2023-11-15 ENCOUNTER — Encounter: Payer: Self-pay | Admitting: Family

## 2023-11-15 LAB — THYROID PEROXIDASE ANTIBODIES (TPO) (REFL): Thyroperoxidase Ab SerPl-aCnc: 1 [IU]/mL (ref <9)

## 2023-11-19 ENCOUNTER — Ambulatory Visit: Payer: Medicaid Other | Admitting: Neurology

## 2023-11-22 ENCOUNTER — Other Ambulatory Visit: Payer: Self-pay | Admitting: Family

## 2023-11-22 DIAGNOSIS — G43101 Migraine with aura, not intractable, with status migrainosus: Secondary | ICD-10-CM

## 2023-11-23 ENCOUNTER — Ambulatory Visit: Payer: Medicaid Other | Admitting: Neurology

## 2023-11-24 ENCOUNTER — Encounter: Payer: Self-pay | Admitting: Obstetrics

## 2023-12-07 NOTE — Progress Notes (Deleted)
 NEUROLOGY CONSULTATION NOTE  Robyn Ortiz MRN: [id number] DOB: 01-Feb-1986  Referring provider: Felicita Horns, FNP Primary care provider: Felicita Horns, FNP  Reason for consult:  migraines  Assessment/Plan:   ***   Subjective:  Robyn Ortiz is a 38 year old ***-handed female with sleep apnea and IBS who presents for migraines.  History supplemented by referring provider's note.  Onset:  *** Location:  *** Quality:  *** Intensity:  ***.  *** denies new headache, thunderclap headache or severe headache that wakes *** from sleep. Aura:  *** Prodrome:  *** Postdrome:  *** Associated symptoms:  ***.  *** denies associated unilateral numbness or weakness. Duration:  *** Frequency:  *** Frequency of abortive medication: *** Triggers:  *** Relieving factors:  *** Activity:  ***  Past NSAIDS/analgesics:  *** Past abortive triptans:  *** Past abortive ergotamine:  *** Past muscle relaxants:  *** Past anti-emetic:  *** Past antihypertensive medications:  *** Past antidepressant medications:  *** Past anticonvulsant medications:  *** Past anti-CGRP:  *** Past vitamins/Herbal/Supplements:  *** Past antihistamines/decongestants:  *** Other past therapies:  ***  Current NSAIDS/analgesics:  *** Current triptans:  *** Current ergotamine:  *** Current anti-emetic:  *** Current muscle relaxants:  *** Current Antihypertensive medications:  *** Current Antidepressant medications:  *** Current Anticonvulsant medications:  *** Current anti-CGRP:  *** Current Vitamins/Herbal/Supplements:  *** Current Antihistamines/Decongestants:  *** Other therapy:  *** Birth control:  *** Other medications:  ***   Caffeine:  *** Alcohol:  *** Smoker:  *** Diet:  *** Exercise:  *** Depression:  ***; Anxiety:  *** Other pain:  *** Sleep hygiene:  *** Family history of headache:  ***      PAST MEDICAL HISTORY: Past Medical History:  Diagnosis Date   Diverticulitis    GERD  (gastroesophageal reflux disease)    History of bleeding ulcers    told not to take ibuprofen    History of bronchitis 28yrs ago   Migraine    Obesity    Sleep apnea    CPAP    PAST SURGICAL HISTORY: Past Surgical History:  Procedure Laterality Date   CESAREAN SECTION N/A 04/08/2015   Procedure: CESAREAN SECTION;  Surgeon: Verlyn Goad, MD;  Location: [facility];  Service: Obstetrics;  Laterality: N/A;   CESAREAN SECTION WITH BILATERAL TUBAL LIGATION  04/08/2015   CHOLECYSTECTOMY  03/27/2014   Dr Marquita Situ   COLONOSCOPY WITH PROPOFOL  N/A 12/18/2021   Procedure: COLONOSCOPY WITH PROPOFOL ;  Surgeon: Selena Daily, MD;  Location: [facility];  Service: Gastroenterology;  Laterality: N/A;   ESOPHAGOGASTRODUODENOSCOPY N/A 05/19/2017   Procedure: ESOPHAGOGASTRODUODENOSCOPY (EGD);  Surgeon: Selena Daily, MD;  Location: [facility];  Service: Endoscopy;  Laterality: N/A;   ESOPHAGOGASTRODUODENOSCOPY (EGD) WITH PROPOFOL  N/A 12/18/2021   Procedure: ESOPHAGOGASTRODUODENOSCOPY (EGD) WITH PROPOFOL ;  Surgeon: Selena Daily, MD;  Location: [facility] ENDOSCOPY;  Service: Gastroenterology;  Laterality: N/A;   GRADUATED/TOLERANCE TEST  03/06/2022   Good exercise tolerance-10:24 min.  Reached peak heart rate of 169 bpm equals 91% MPHR at 186 bpm.  No EKG changes, chest pain or dyspnea.   TONSILLECTOMY  03/10/2012   Procedure: TONSILLECTOMY;  Surgeon: Vernadine Golas, MD;  Location: [facility] OR;  Service: ENT;  Laterality: Bilateral;    MEDICATIONS: Current Outpatient Medications on File Prior to Visit  Medication Sig Dispense Refill   acetaminophen  (TYLENOL ) 325 MG tablet Take 650 mg by mouth every 6 (six) hours as needed for moderate pain.     AIMOVIG  140 MG/ML SOAJ SMARTSIG:140  Milligram(s) SUB-Q Every 4 Weeks 1 mL 5   Azelastine -Fluticasone  137-50 MCG/ACT SUSP Place 1 spray into the nose every 12 (twelve) hours. 23 g 2   cholestyramine  (QUESTRAN ) 4 g packet TAKE 1 PACKET BY MOUTH 2 TIMES  DAILY. 180 packet 5   levocetirizine (XYZAL ) 5 MG tablet Take 1 tablet (5 mg total) by mouth every evening. 90 tablet 3   montelukast (SINGULAIR) 10 MG tablet TAKE 1 TABLET BY MOUTH EVERY DAY 90 tablet 1   mupirocin  ointment (BACTROBAN ) 2 % Apply 1 Application topically 2 (two) times daily. 22 g 0   norethindrone-ethinyl estradiol -iron (JUNEL FE 1.5/30) 1.5-30 MG-MCG tablet Take 1 tablet by mouth daily. 84 tablet 4   ondansetron  (ZOFRAN ) 4 MG tablet Take 1 tablet (4 mg total) by mouth every 6 (six) hours. 12 tablet 0   pantoprazole  (PROTONIX ) 40 MG tablet Take 1 tablet (40 mg total) by mouth 2 (two) times daily. 60 tablet 2   PROAIR  HFA 108 (90 Base) MCG/ACT inhaler Inhale 2 puffs into the lungs every 6 (six) hours as needed for wheezing or shortness of breath.     rizatriptan  (MAXALT ) 5 MG tablet TAKE 1 TABLET BY MOUTH AS NEEDED FOR MIGRAINE. MAY REPEAT IN 2 HOURS IF NEEDED 10 tablet 0   Semaglutide -Weight Management (WEGOVY ) 1 MG/0.5ML SOAJ Inject 1 mg into the skin once a week. 2 mL 2   valACYclovir  (VALTREX ) 1000 MG tablet Take 2 tabs (2000 mg) by mouth at onset of first symptoms of cold sore. Take 2 tabs again 12hrs later, then stop. 30 tablet 1   No current facility-administered medications on file prior to visit.    ALLERGIES: Allergies  Allergen Reactions   Codeine Other (See Comments)    Pt. States she had a seizure   Latex     FAMILY HISTORY: Family History  Problem Relation Age of Onset   Bipolar disorder Mother    Schizophrenia Mother    Diabetes Father    Heart disease Sister    Suicidality Brother    Heart disease Maternal Grandmother    Alcohol abuse Maternal Grandfather    Gastric cancer Paternal Grandfather    Arthritis Paternal Grandfather     Objective:  *** General: No acute distress.  Patient appears well-groomed.   Head:  Normocephalic/atraumatic Eyes:  fundi examined but not visualized Neck: supple, no paraspinal tenderness, full range of motion Back:  No paraspinal tenderness Heart: regular rate and rhythm Lungs: Clear to auscultation bilaterally. Vascular: No carotid bruits. Neurological Exam: Mental status: alert and oriented to person, place, and time, speech fluent and not dysarthric, language intact. Cranial nerves: CN I: not tested CN II: pupils equal, round and reactive to light, visual fields intact CN III, IV, VI:  full range of motion, no nystagmus, no ptosis CN V: facial sensation intact. CN VII: upper and lower face symmetric CN VIII: hearing intact CN IX, X: gag intact, uvula midline CN XI: sternocleidomastoid and trapezius muscles intact CN XII: tongue midline Bulk & Tone: normal, no fasciculations. Motor:  muscle strength 5/5 throughout Sensation:  Pinprick, temperature and vibratory sensation intact. Deep Tendon Reflexes:  2+ throughout,  toes downgoing.   Finger to nose testing:  Without dysmetria.   Heel to shin:  Without dysmetria.   Gait:  Normal station and stride.  Romberg negative.    Thank you for allowing me to take part in the care of this patient.  Janne Members, DO  CC: ***

## 2023-12-08 ENCOUNTER — Ambulatory Visit: Payer: Medicaid Other | Admitting: Neurology

## 2023-12-10 ENCOUNTER — Ambulatory Visit: Payer: Medicaid Other | Admitting: Neurology

## 2023-12-13 ENCOUNTER — Encounter: Payer: Self-pay | Admitting: Family

## 2023-12-13 ENCOUNTER — Telehealth: Payer: Self-pay | Admitting: Pharmacy Technician

## 2023-12-13 ENCOUNTER — Other Ambulatory Visit (HOSPITAL_COMMUNITY): Payer: Self-pay

## 2023-12-13 DIAGNOSIS — E66811 Obesity, class 1: Secondary | ICD-10-CM

## 2023-12-13 NOTE — Telephone Encounter (Signed)
 Pharmacy Patient Advocate Encounter   Received notification from CoverMyMeds that prior authorization for Wegovy  1MG /0.5ML auto-injectors is required/requested.   Insurance verification completed.   The patient is insured through [facility]  [place] .   Per test claim: PA required; PA submitted to above mentioned insurance via CoverMyMeds Key/confirmation #/EOC [id number] Status is pending

## 2023-12-13 NOTE — Telephone Encounter (Signed)
 Pharmacy Patient Advocate Encounter  Received notification from [facility] Medicaid that Prior Authorization for WEGOVY  1MG  has been DENIED.  Full denial letter will be uploaded to the media tab. See denial reason below.      PA #/Case ID/Reference #: [id number]

## 2023-12-14 ENCOUNTER — Other Ambulatory Visit: Payer: Self-pay | Admitting: Family

## 2023-12-14 DIAGNOSIS — E66811 Obesity, class 1: Secondary | ICD-10-CM

## 2023-12-14 MED ORDER — PHENTERMINE HCL 15 MG PO CAPS
ORAL_CAPSULE | ORAL | 0 refills | Status: DC
Start: 2023-12-14 — End: 2024-02-29

## 2023-12-14 MED ORDER — PHENTERMINE HCL 37.5 MG PO CAPS: ORAL_CAPSULE | ORAL | 0 refills | Status: DC

## 2023-12-14 NOTE — Addendum Note (Signed)
 Addended by: Felicita Horns on: 12/14/2023 02:52 PM   Modules accepted: Orders

## 2023-12-23 ENCOUNTER — Other Ambulatory Visit: Payer: Self-pay | Admitting: Family

## 2023-12-23 DIAGNOSIS — K2100 Gastro-esophageal reflux disease with esophagitis, without bleeding: Secondary | ICD-10-CM

## 2024-01-08 ENCOUNTER — Other Ambulatory Visit: Payer: Self-pay | Admitting: Family

## 2024-01-08 DIAGNOSIS — E66811 Obesity, class 1: Secondary | ICD-10-CM

## 2024-01-10 NOTE — Addendum Note (Signed)
 Addended by: Felicita Horns on: 01/10/2024 08:04 PM   Modules accepted: Orders

## 2024-01-14 ENCOUNTER — Ambulatory Visit (INDEPENDENT_AMBULATORY_CARE_PROVIDER_SITE_OTHER): Admitting: Otolaryngology

## 2024-01-18 ENCOUNTER — Other Ambulatory Visit: Payer: Self-pay | Admitting: Family

## 2024-01-18 DIAGNOSIS — G43101 Migraine with aura, not intractable, with status migrainosus: Secondary | ICD-10-CM

## 2024-02-02 ENCOUNTER — Other Ambulatory Visit: Payer: Self-pay | Admitting: Family

## 2024-02-02 DIAGNOSIS — G43101 Migraine with aura, not intractable, with status migrainosus: Secondary | ICD-10-CM

## 2024-02-04 ENCOUNTER — Encounter: Payer: Self-pay | Admitting: Family

## 2024-02-09 ENCOUNTER — Other Ambulatory Visit: Payer: Self-pay | Admitting: Family

## 2024-02-10 ENCOUNTER — Other Ambulatory Visit: Payer: Self-pay | Admitting: Family

## 2024-02-10 DIAGNOSIS — R040 Epistaxis: Secondary | ICD-10-CM

## 2024-02-10 DIAGNOSIS — G43101 Migraine with aura, not intractable, with status migrainosus: Secondary | ICD-10-CM

## 2024-02-10 DIAGNOSIS — E66811 Obesity, class 1: Secondary | ICD-10-CM

## 2024-02-10 DIAGNOSIS — F411 Generalized anxiety disorder: Secondary | ICD-10-CM

## 2024-02-14 ENCOUNTER — Encounter (INDEPENDENT_AMBULATORY_CARE_PROVIDER_SITE_OTHER): Payer: Self-pay

## 2024-02-27 ENCOUNTER — Other Ambulatory Visit: Payer: Self-pay | Admitting: Family

## 2024-02-27 DIAGNOSIS — F411 Generalized anxiety disorder: Secondary | ICD-10-CM

## 2024-02-28 ENCOUNTER — Encounter: Payer: Self-pay | Admitting: Family

## 2024-02-28 DIAGNOSIS — E66811 Obesity, class 1: Secondary | ICD-10-CM

## 2024-02-29 MED ORDER — PHENTERMINE HCL 37.5 MG PO TABS
37.5000 mg | ORAL_TABLET | Freq: Every day | ORAL | 0 refills | Status: DC
Start: 2024-02-29 — End: 2024-04-18

## 2024-02-29 NOTE — Addendum Note (Signed)
 Addended by: CORWIN ANTU on: 02/29/2024 09:11 AM   Modules accepted: Orders

## 2024-03-08 ENCOUNTER — Ambulatory Visit: Admitting: Family Medicine

## 2024-03-09 ENCOUNTER — Encounter: Payer: Self-pay | Admitting: Family Medicine

## 2024-03-09 ENCOUNTER — Ambulatory Visit: Admitting: Family Medicine

## 2024-03-09 VITALS — BP 100/70 | HR 67 | Temp 98.0°F | Ht 65.0 in | Wt 191.1 lb

## 2024-03-09 DIAGNOSIS — M25552 Pain in left hip: Secondary | ICD-10-CM

## 2024-03-09 DIAGNOSIS — M545 Low back pain, unspecified: Secondary | ICD-10-CM | POA: Diagnosis not present

## 2024-03-09 DIAGNOSIS — S76012A Strain of muscle, fascia and tendon of left hip, initial encounter: Secondary | ICD-10-CM

## 2024-03-09 MED ORDER — PREDNISONE 20 MG PO TABS
ORAL_TABLET | ORAL | 0 refills | Status: DC
Start: 2024-03-09 — End: 2024-03-28

## 2024-03-09 MED ORDER — TIZANIDINE HCL 4 MG PO TABS: 4.0000 mg | ORAL_TABLET | Freq: Every evening | ORAL | 1 refills | Status: AC | PRN

## 2024-03-09 NOTE — Progress Notes (Unsigned)
     Robyn Ortiz T. Robyn Creasey, MD, CAQ Sports Medicine [facility] [address] [place], [address]  Phone: [phone]  FAX: [phone]  Robyn Ortiz - 38 y.o. female  MRN [id number]  Date of Birth: October 28, 1985  Date: 03/09/2024  PCP: Robyn Antu, FNP  Referral: Robyn Antu, FNP  Chief Complaint  Patient presents with   Hip Pain    Left-Radiates to Groin Area   Neck Pain   Subjective:   Robyn Ortiz is a 38 y.o. very pleasant female patient with Body mass index is 31.8 kg/m. who presents with the following:  L groin pain  Up and down stairs for   Put paint on kodak paper  One month Heating pad Massage Ice Motrin , tylenol , asa  L top of foot and bottom pinprick less    Review of Systems is noted in the HPI, as appropriate  Objective:   BP 100/70   Pulse 67   Temp 98 F (36.7 C) (Temporal)   Ht 5' 5 (1.651 m)   Wt 191 lb 2 oz (86.7 kg)   LMP 02/23/2024   SpO2 99%   BMI 31.80 kg/m   GEN: No acute distress; alert,appropriate. PULM: Breathing comfortably in no respiratory distress PSYCH: Normally interactive.   Laboratory and Imaging Data:  Assessment and Plan:   ***

## 2024-03-28 ENCOUNTER — Ambulatory Visit
Admission: EM | Admit: 2024-03-28 | Discharge: 2024-03-28 | Disposition: A | Discharge status: Home / Self Care | Attending: Emergency Medicine | Admitting: Emergency Medicine

## 2024-03-28 ENCOUNTER — Encounter: Payer: Self-pay | Admitting: Emergency Medicine

## 2024-03-28 DIAGNOSIS — B349 Viral infection, unspecified: Secondary | ICD-10-CM

## 2024-03-28 DIAGNOSIS — U071 COVID-19: Secondary | ICD-10-CM | POA: Diagnosis not present

## 2024-03-28 LAB — POC SOFIA SARS ANTIGEN FIA: SARS Coronavirus 2 Ag: POSITIVE — AB

## 2024-03-28 MED ORDER — BENZONATATE 100 MG PO CAPS: 100.0000 mg | ORAL_CAPSULE | Freq: Three times a day (TID) | ORAL | 0 refills | Status: DC

## 2024-03-28 MED ORDER — PROMETHAZINE-DM 6.25-15 MG/5ML PO SYRP: 5.0000 mL | ORAL_SOLUTION | Freq: Four times a day (QID) | ORAL | 0 refills | Status: DC | PRN

## 2024-03-28 MED ORDER — PREDNISONE 10 MG (21) PO TBPK: ORAL_TABLET | Freq: Every day | ORAL | 0 refills | Status: DC

## 2024-03-28 MED ORDER — PAXLOVID (300/100) 20 X 150 MG & 10 X 100MG PO TBPK: 3.0000 | ORAL_TABLET | Freq: Two times a day (BID) | ORAL | 0 refills | Status: AC

## 2024-03-28 NOTE — ED Triage Notes (Signed)
 Patient complains of cough with yellowish-brownish sputum, head ache, body aches and nasal drainage x 3 days. Patient has taken dayquil/Tylenol  and Ibuprofen . Patient last dose of medication was Dayquil at 11:30 am. Rates head ache 9/10. Rates body aches 6/10.

## 2024-03-28 NOTE — Discharge Instructions (Signed)
 Covid 19 is a virus and should steadily improve in time it can take up to 7 to 10 days before you truly start to see a turnaround however things will get better  Begin Paxlovid  every morning and every evening for 5 days to reduce the amount of virus in your body which helps settle down symptoms but does not fully take away your illness    Per the CDC you will need to quarantine and to your 24 hours without fever, if no fever may continue activity wearing mask  Started tomorrow take prednisone  every morning with food to help relax airway she may be easier for you to  May take Tessalon  Perles every 8 hours with cough syrup every 6 hours, please be mindful the cough syrup can make you feel drowsy  You can take Tylenol  and/or Ibuprofen  as needed for fever reduction and pain relief.   For cough: honey 1/2 to 1 teaspoon (you can dilute the honey in water or another fluid).  You can also use guaifenesin  and dextromethorphan  for cough. You can use a humidifier for chest congestion and cough.  If you don't have a humidifier, you can sit in the bathroom with the hot shower running.      For sore throat: try warm salt water gargles, cepacol lozenges, throat spray, warm tea or water with lemon/honey, popsicles or ice, or OTC cold relief medicine for throat discomfort.   For congestion: take a daily anti-histamine like Zyrtec , Claritin, and a oral decongestant, such as pseudoephedrine.  You can also use Flonase 1-2 sprays in each nostril daily.   It is important to stay hydrated: drink plenty of fluids (water, gatorade/powerade/pedialyte, juices, or teas) to keep your throat moisturized and help further relieve irritation/discomfort.

## 2024-03-28 NOTE — ED Provider Notes (Signed)
 Robyn Ortiz    CSN: [id number] Arrival date & time: 03/28/24  1647      History   Chief Complaint Chief Complaint  Patient presents with   Cough   Generalized Body Aches    HPI Robyn Ortiz is a 38 y.o. female.   Patient presents for evaluation of chills, generalized bodyaches, intermittent headaches, productive cough, nasal congestion, bilateral ear pain, sore throat, shortness of breath at rest and mild wheezing present for 3 days.  No known sick contact prior.  Decreased appetite but tolerable to some food and liquids.  Has attempted use of DayQuil, Tylenol  and ibuprofen .  Past Medical History:  Diagnosis Date   Diverticulitis    GERD (gastroesophageal reflux disease)    History of bleeding ulcers    told not to take ibuprofen    History of bronchitis 55yrs ago   Migraine    Obesity    Sleep apnea    CPAP    Patient Active Problem List   Diagnosis Date Noted   Abnormal serum thyroxine (T4) level 11/12/2023   Daytime sleepiness 11/12/2023   Menorrhagia with regular cycle 10/08/2023   Abnormal uterine bleeding (AUB) 10/08/2023   Deviated nasal septum 09/25/2023   Hypertrophy of nasal turbinates 09/25/2023   Allergic rhinitis 08/13/2023   Gastroesophageal reflux disease with esophagitis without hemorrhage 08/13/2023   Class 1 obesity due to excess calories without serious comorbidity with body mass index (BMI) of 30.0 to 30.9 in adult 03/26/2023   Vapes nicotine  containing substance 02/11/2023   GAD (generalized anxiety disorder) 02/11/2023   Eczema 09/04/2014   Asthma 11/21/2012   Ex-smoker 11/21/2012   Condyloma acuminatum of perianal region 11/21/2012   Migraine 11/21/2012    Past Surgical History:  Procedure Laterality Date   CESAREAN SECTION N/A 04/08/2015   Procedure: CESAREAN SECTION;  Surgeon: Winton Felt, MD;  Location: [facility];  Service: Obstetrics;  Laterality: N/A;   CESAREAN SECTION WITH BILATERAL TUBAL LIGATION  04/08/2015    CHOLECYSTECTOMY  03/27/2014   Dr Dessa   COLONOSCOPY WITH PROPOFOL  N/A 12/18/2021   Procedure: COLONOSCOPY WITH PROPOFOL ;  Surgeon: Unk Corinn Skiff, MD;  Location: [facility] ENDOSCOPY;  Service: Gastroenterology;  Laterality: N/A;   ESOPHAGOGASTRODUODENOSCOPY N/A 05/19/2017   Procedure: ESOPHAGOGASTRODUODENOSCOPY (EGD);  Surgeon: Unk Corinn Skiff, MD;  Location: [facility];  Service: Endoscopy;  Laterality: N/A;   ESOPHAGOGASTRODUODENOSCOPY (EGD) WITH PROPOFOL  N/A 12/18/2021   Procedure: ESOPHAGOGASTRODUODENOSCOPY (EGD) WITH PROPOFOL ;  Surgeon: Unk Corinn Skiff, MD;  Location: [facility] ENDOSCOPY;  Service: Gastroenterology;  Laterality: N/A;   GRADUATED/TOLERANCE TEST  03/06/2022   Good exercise tolerance-10:24 min.  Reached peak heart rate of 169 bpm equals 91% MPHR at 186 bpm.  No EKG changes, chest pain or dyspnea.   TONSILLECTOMY  03/10/2012   Procedure: TONSILLECTOMY;  Surgeon: Norleen Notice, MD;  Location: [facility] OR;  Service: ENT;  Laterality: Bilateral;    OB History     Gravida  3   Para  3   Term  3   Preterm      AB      Living  3      SAB      IAB      Ectopic      Multiple  0   Live Births  3        Obstetric Comments  1st Menstrual Cycle:  14  1st Pregnancy:  17          Home Medications    Prior to  Admission medications   Medication Sig Start Date End Date Taking? Authorizing Provider  benzonatate  (TESSALON ) 100 MG capsule Take 1 capsule (100 mg total) by mouth every 8 (eight) hours. 03/28/24  Yes Casten Floren R, NP  nirmatrelvir/ritonavir (PAXLOVID , 300/100,) 20 x 150 MG & 10 x 100MG  TBPK Take 3 tablets by mouth 2 (two) times Robyn for 5 days. Patient GFR is 117. Take nirmatrelvir (150 mg) two tablets twice Robyn for 5 days and ritonavir (100 mg) one tablet twice Robyn for 5 days. 03/28/24 04/02/24 Yes Daje Stark R, NP  predniSONE  (STERAPRED UNI-PAK 21 TAB) 10 MG (21) TBPK tablet Take by mouth Robyn. Take 6 tabs by mouth Robyn  for 1  days, then 5 tabs for 1 days, then 4 tabs for 1 days, then 3 tabs for 1 days, 2 tabs for 1 days, then 1 tab by mouth Robyn for 1 days 03/28/24  Yes Junko Ohagan R, NP  promethazine -dextromethorphan  (PROMETHAZINE -DM) 6.25-15 MG/5ML syrup Take 5 mLs by mouth 4 (four) times Robyn as needed for cough. 03/28/24  Yes Deonte Otting R, NP  acetaminophen  (TYLENOL ) 325 MG tablet Take 650 mg by mouth every 6 (six) hours as needed for moderate pain.    [provider]  AIMOVIG  140 MG/ML SOAJ INJECT 140 MILLIGRAM(S) SUBCUTANEOUSLY EVERY 4 WEEKS 01/18/24   Dugal, Ginger, FNP  Azelastine -Fluticasone  137-50 MCG/ACT SUSP Place 1 spray into the nose every 12 (twelve) hours. 08/13/23   Dugal, Tabitha, FNP  cholestyramine  (QUESTRAN ) 4 g packet TAKE 1 PACKET BY MOUTH 2 TIMES Robyn. 05/20/23   Vanga, Rohini Reddy, MD  levocetirizine (XYZAL ) 5 MG tablet Take 1 tablet (5 mg total) by mouth every evening. 11/12/23   Corwin Ginger, FNP  montelukast (SINGULAIR) 10 MG tablet TAKE 1 TABLET BY MOUTH EVERY DAY 02/10/24   Dugal, Tabitha, FNP  mupirocin  ointment (BACTROBAN ) 2 % APPLY TO AFFECTED AREA TWICE A DAY 02/11/24   Corwin Ginger, FNP  ondansetron  (ZOFRAN ) 4 MG tablet Take 1 tablet (4 mg total) by mouth every 6 (six) hours. 07/15/23   Neysa Caron PARAS, DO  pantoprazole  (PROTONIX ) 40 MG tablet TAKE 1 TABLET BY MOUTH TWICE A DAY 12/23/23   Corwin Ginger, FNP  phentermine  (ADIPEX-P ) 37.5 MG tablet Take 1 tablet (37.5 mg total) by mouth Robyn before breakfast. 02/29/24   Corwin Ginger, FNP  PROAIR  HFA 108 (90 Base) MCG/ACT inhaler Inhale 2 puffs into the lungs every 6 (six) hours as needed for wheezing or shortness of breath. 02/11/23   Dugal, Ginger, FNP  rizatriptan  (MAXALT ) 5 MG tablet TAKE 1 TABLET BY MOUTH AS NEEDED FOR MIGRAINE. MAY REPEAT IN 2 HOURS IF NEEDED 02/10/24   Corwin Ginger, FNP  tiZANidine  (ZANAFLEX ) 4 MG tablet Take 1 tablet (4 mg total) by mouth at bedtime as needed for muscle spasms. 03/09/24   Copland,  Jacques, MD  UBRELVY 100 MG TABS Take 1 tablet by mouth Robyn as needed. 03/04/24   [provider]  valACYclovir  (VALTREX ) 1000 MG tablet Take 2 tabs (2000 mg) by mouth at onset of first symptoms of cold sore. Take 2 tabs again 12hrs later, then stop. 11/04/23   Leigh Sober, MD    Family History Family History  Problem Relation Age of Onset   Bipolar disorder Mother    Schizophrenia Mother    Diabetes Father    Heart disease Sister    Suicidality Brother    Heart disease Maternal Grandmother    Alcohol abuse Maternal Risk analyst  cancer Paternal Grandfather    Arthritis Paternal Grandfather     Social History Social History   Tobacco Use   Smoking status: Former    Current packs/day: 1.00    Average packs/day: 1 pack/day for 13.0 years (13.0 ttl pk-yrs)    Types: Cigarettes   Smokeless tobacco: Never  Vaping Use   Vaping status: Every Day  Substance Use Topics   Alcohol use: Yes    Comment: beer occasionally   Drug use: No     Allergies   Codeine and Latex   Review of Systems Review of Systems   Physical Exam Triage Vital Signs ED Triage Vitals  Encounter Vitals Group     BP 03/28/24 1654 95/65     Girls Systolic BP Percentile --      Girls Diastolic BP Percentile --      Boys Systolic BP Percentile --      Boys Diastolic BP Percentile --      Pulse Rate 03/28/24 1654 65     Resp 03/28/24 1654 20     Temp 03/28/24 1654 98 F (36.7 C)     Temp Source 03/28/24 1654 Oral     SpO2 03/28/24 1654 99 %     Weight --      Height --      Head Circumference --      Peak Flow --      Pain Score 03/28/24 1658 9     Pain Loc --      Pain Education --      Exclude from Growth Chart --    No data found.  Updated Vital Signs BP 95/65 (BP Location: Left Arm)   Pulse 65   Temp 98 F (36.7 C) (Oral)   Resp 20   LMP 03/20/2024 (Approximate)   SpO2 99%   Visual Acuity Right Eye Distance:   Left Eye Distance:   Bilateral Distance:     Right Eye Near:   Left Eye Near:    Bilateral Near:     Physical Exam Constitutional:      Appearance: Normal appearance.  HENT:     Head: Normocephalic.     Right Ear: Tympanic membrane, ear canal and external ear normal.     Left Ear: Tympanic membrane, ear canal and external ear normal.     Nose: Congestion present.     Mouth/Throat:     Mouth: Mucous membranes are moist.     Pharynx: Oropharynx is clear. No oropharyngeal exudate or posterior oropharyngeal erythema.  Eyes:     Extraocular Movements: Extraocular movements intact.  Cardiovascular:     Rate and Rhythm: Normal rate and regular rhythm.     Pulses: Normal pulses.     Heart sounds: Normal heart sounds.  Pulmonary:     Effort: Pulmonary effort is normal.     Breath sounds: Normal breath sounds.  Musculoskeletal:     Cervical back: Normal range of motion and neck supple.  Neurological:     Mental Status: She is alert and oriented to person, place, and time. Mental status is at baseline.      UC Treatments / Results  Labs (all labs ordered are listed, but only abnormal results are displayed) Labs Reviewed  POC SOFIA SARS ANTIGEN FIA - Abnormal; Notable for the following components:      Result Value   SARS Coronavirus 2 Ag Positive (*)    All other components within normal limits    EKG  Radiology No results found.  Procedures Procedures (including critical care time)  Medications Ordered in UC Medications - No data to display  Initial Impression / Assessment and Plan / UC Course  I have reviewed the triage vital signs and the nursing notes.  Pertinent labs & imaging results that were available during my care of the patient were reviewed by me and considered in my medical decision making (see chart for details).  COVID-19  Patient is in no signs of distress nor toxic appearing.  Vital signs are stable.  Low suspicion for pneumonia, pneumothorax or bronchitis and therefore will defer imaging.   History of reoccurring bronchitis, prescribed Paxlovid  and discussed administration, discussed potential interaction with you Ubrevly.  Prescribed Tessalon  and Promethazine  DM and prednisone .May use additional over-the-counter medications as needed for supportive care.  May follow-up with urgent care as needed if symptoms persist or worsen.  Note given.   Final Clinical Impressions(s) / UC Diagnoses   Final diagnoses:  Viral illness  COVID-19     Discharge Instructions      Covid 19 is a virus and should steadily improve in time it can take up to 7 to 10 days before you truly start to see a turnaround however things will get better  Begin Paxlovid  every morning and every evening for 5 days to reduce the amount of virus in your body which helps settle down symptoms but does not fully take away your illness    Per the CDC you will need to quarantine and to your 24 hours without fever, if no fever may continue activity wearing mask  Started tomorrow take prednisone  every morning with food to help relax airway she may be easier for you to  May take Tessalon  Perles every 8 hours with cough syrup every 6 hours, please be mindful the cough syrup can make you feel drowsy  You can take Tylenol  and/or Ibuprofen  as needed for fever reduction and pain relief.   For cough: honey 1/2 to 1 teaspoon (you can dilute the honey in water or another fluid).  You can also use guaifenesin  and dextromethorphan  for cough. You can use a humidifier for chest congestion and cough.  If you don't have a humidifier, you can sit in the bathroom with the hot shower running.      For sore throat: try warm salt water gargles, cepacol lozenges, throat spray, warm tea or water with lemon/honey, popsicles or ice, or OTC cold relief medicine for throat discomfort.   For congestion: take a Robyn anti-histamine like Zyrtec , Claritin, and a oral decongestant, such as pseudoephedrine.  You can also use Flonase 1-2 sprays in  each nostril Robyn.   It is important to stay hydrated: drink plenty of fluids (water, gatorade/powerade/pedialyte, juices, or teas) to keep your throat moisturized and help further relieve irritation/discomfort.    ED Prescriptions     Medication Sig Dispense Auth. Provider   nirmatrelvir/ritonavir (PAXLOVID , 300/100,) 20 x 150 MG & 10 x 100MG  TBPK Take 3 tablets by mouth 2 (two) times Robyn for 5 days. Patient GFR is 117. Take nirmatrelvir (150 mg) two tablets twice Robyn for 5 days and ritonavir (100 mg) one tablet twice Robyn for 5 days. 30 tablet Oneika Simonian R, NP   benzonatate  (TESSALON ) 100 MG capsule Take 1 capsule (100 mg total) by mouth every 8 (eight) hours. 21 capsule Marta Bouie R, NP   predniSONE  (STERAPRED UNI-PAK 21 TAB) 10 MG (21) TBPK tablet Take by mouth Robyn. Take 6 tabs  by mouth Robyn  for 1 days, then 5 tabs for 1 days, then 4 tabs for 1 days, then 3 tabs for 1 days, 2 tabs for 1 days, then 1 tab by mouth Robyn for 1 days 21 tablet Ercell Perlman R, NP   promethazine -dextromethorphan  (PROMETHAZINE -DM) 6.25-15 MG/5ML syrup Take 5 mLs by mouth 4 (four) times Robyn as needed for cough. 118 mL Chelisa Hennen, Shelba SAUNDERS, NP      I have reviewed the PDMP during this encounter.   Teresa Shelba SAUNDERS, NP 03/28/24 1821

## 2024-03-30 ENCOUNTER — Institutional Professional Consult (permissible substitution) (INDEPENDENT_AMBULATORY_CARE_PROVIDER_SITE_OTHER): Payer: Self-pay | Admitting: Adult Health

## 2024-03-31 ENCOUNTER — Other Ambulatory Visit: Payer: Self-pay | Admitting: Family Medicine

## 2024-04-02 ENCOUNTER — Other Ambulatory Visit: Payer: Self-pay | Admitting: Family

## 2024-04-02 DIAGNOSIS — J309 Allergic rhinitis, unspecified: Secondary | ICD-10-CM

## 2024-04-04 ENCOUNTER — Other Ambulatory Visit: Payer: Self-pay | Admitting: Family

## 2024-04-04 DIAGNOSIS — E66811 Obesity, class 1: Secondary | ICD-10-CM

## 2024-04-05 NOTE — Progress Notes (Unsigned)
 [address] [place], [place], [place] [address] Office: [phone]  /  Fax: [phone]   Initial Consultation    Robyn Ortiz was seen in clinic today to evaluate for obesity. She is interested in losing weight to improve overall health and reduce the risk of weight related complications. She presents today to review program treatment options, initial physical assessment, and evaluation.    Anthropometrics and Bioimpedance Analysis   There is no height or weight on file to calculate BMI. Body Fat Mass : *** % Visceral Fat Mass Rating : *** Waist to Height Ratio: ***  Obesity Related Diseases and Complications  Obesity Quality of Life and Psychosocial Complications: {emqolpsychosoc:33006::Reduced health-related quality of life}  Cardiometabolic: {emcardiometabolic complications:33007}  Biomechanical: {embiomechanical:33008}   Weight Related History  She was referred by: {emreferby:28303}  When asked what they would like to accomplish? She states: {EMHopetoaccomplish:28304::Adopt a healthier eating pattern and lifestyle,Improve energy levels and physical activity,Improve existing medical conditions,Improve quality of life}  Weight history: ***  Highest weight: ***  Contributing factors: {EMcontributingfactors:28307}  Prior weight loss attempts: {emweightlossprograms:31590::None}  Current or previous pharmacotherapy: {EM previousRx:28311}  Response to medication: {EMResponsetomedication:28312}  Current nutrition plan: {EMNutritionplan:28309::None}  Greatest challenge with dieting: {emgreatestchallengediet:31593}.  Current level of physical activity: {EMcurrentPA:28310::None}  Barriers to Exercise: {embarrierstoexercise:32606::no barriers}  Readiness and Motivation  On a scale from 0 to 10 How ready are you to make changes to your eating and physical activity to lose weight? {NUMBER 1-10:22536} How important is it for you to lose weight right  now ? {NUMBER 1-10:22536} How confident are you that you can lose weight if you try? {NUMBER [id number]  Past Medical History   Past Medical History:  Diagnosis Date   Diverticulitis    GERD (gastroesophageal reflux disease)    History of bleeding ulcers    told not to take ibuprofen    History of bronchitis 39yrs ago   Migraine    Obesity    Sleep apnea    CPAP     Objective    LMP 03/20/2024 (Approximate)  She was weighed on the bioimpedance scale: There is no height or weight on file to calculate BMI.    General:  Alert, oriented and cooperative. Patient is in no acute distress.  Respiratory: Normal respiratory effort, no problems with respiration noted   Gait: able to ambulate independently  Mental Status: Normal mood and affect. Normal behavior. Normal judgment and thought content.   Diagnostic Data Reviewed  BMET    Component Value Date/Time   NA 141 11/12/2023 0927   K 4.6 11/12/2023 0927   CL 108 11/12/2023 0927   CO2 27 11/12/2023 0927   GLUCOSE 73 11/12/2023 0927   BUN 11 11/12/2023 0927   CREATININE 0.56 11/12/2023 0927   CALCIUM 9.0 11/12/2023 0927   GFRNONAA >60 02/05/2022 2153   GFRAA >60 08/03/2019 0707   No results found for: HGBA1C No results found for: INSULIN CBC    Component Value Date/Time   WBC 5.1 08/13/2023 0802   RBC 4.30 08/13/2023 0802   HGB 13.2 08/13/2023 0802   HGB 14.0 11/26/2021 1535   HCT 39.8 08/13/2023 0802   HCT 41.7 11/26/2021 1535   PLT 239.0 08/13/2023 0802   PLT 289 11/26/2021 1535   MCV 92.5 08/13/2023 0802   MCV 89 11/26/2021 1535   MCH 29.2 02/05/2022 2153   MCHC 33.3 08/13/2023 0802   RDW 13.2 08/13/2023 0802   RDW 12.4 11/26/2021 1535   Iron/TIBC/Ferritin/ %Sat    Component Value  Date/Time   IRON 111 12/09/2017 1259   TIBC 398 12/09/2017 1259   FERRITIN 36 11/26/2021 1535   IRONPCTSAT 28 12/09/2017 1259   Lipid Panel     Component Value Date/Time   CHOL 147 11/12/2023 0927   TRIG 55.0  11/12/2023 0927   HDL 56.10 11/12/2023 0927   CHOLHDL 3 11/12/2023 0927   VLDL 11.0 11/12/2023 0927   LDLCALC 80 11/12/2023 0927   Hepatic Function Panel     Component Value Date/Time   PROT 6.6 08/13/2023 0802   ALBUMIN 4.1 08/13/2023 0802   AST 27 08/13/2023 0802   ALT 34 08/13/2023 0802   ALKPHOS 43 08/13/2023 0802   BILITOT 0.3 08/13/2023 0802   BILIDIR 0.1 12/09/2017 1259   IBILI 0.5 12/09/2017 1259      Component Value Date/Time   TSH 1.49 11/12/2023 0927    Medications  Outpatient Encounter Medications as of 04/06/2024  Medication Sig   acetaminophen  (TYLENOL ) 325 MG tablet Take 650 mg by mouth every 6 (six) hours as needed for moderate pain.   AIMOVIG  140 MG/ML SOAJ INJECT 140 MILLIGRAM(S) SUBCUTANEOUSLY EVERY 4 WEEKS   Azelastine -Fluticasone  (DYMISTA ) 137-50 MCG/ACT SUSP PLACE 1 SPRAY INTO THE NOSE EVERY 12 (TWELVE) HOURS.   benzonatate  (TESSALON ) 100 MG capsule Take 1 capsule (100 mg total) by mouth every 8 (eight) hours.   cholestyramine  (QUESTRAN ) 4 g packet TAKE 1 PACKET BY MOUTH 2 TIMES DAILY.   levocetirizine (XYZAL ) 5 MG tablet Take 1 tablet (5 mg total) by mouth every evening.   montelukast (SINGULAIR) 10 MG tablet TAKE 1 TABLET BY MOUTH EVERY DAY   mupirocin  ointment (BACTROBAN ) 2 % APPLY TO AFFECTED AREA TWICE A DAY   ondansetron  (ZOFRAN ) 4 MG tablet Take 1 tablet (4 mg total) by mouth every 6 (six) hours.   pantoprazole  (PROTONIX ) 40 MG tablet TAKE 1 TABLET BY MOUTH TWICE A DAY   phentermine  (ADIPEX-P ) 37.5 MG tablet Take 1 tablet (37.5 mg total) by mouth daily before breakfast.   predniSONE  (STERAPRED UNI-PAK 21 TAB) 10 MG (21) TBPK tablet Take by mouth daily. Take 6 tabs by mouth daily  for 1 days, then 5 tabs for 1 days, then 4 tabs for 1 days, then 3 tabs for 1 days, 2 tabs for 1 days, then 1 tab by mouth daily for 1 days   PROAIR  HFA 108 (90 Base) MCG/ACT inhaler Inhale 2 puffs into the lungs every 6 (six) hours as needed for wheezing or shortness of  breath.   promethazine -dextromethorphan  (PROMETHAZINE -DM) 6.25-15 MG/5ML syrup Take 5 mLs by mouth 4 (four) times daily as needed for cough.   rizatriptan  (MAXALT ) 5 MG tablet TAKE 1 TABLET BY MOUTH AS NEEDED FOR MIGRAINE. MAY REPEAT IN 2 HOURS IF NEEDED   tiZANidine  (ZANAFLEX ) 4 MG tablet Take 1 tablet (4 mg total) by mouth at bedtime as needed for muscle spasms.   UBRELVY 100 MG TABS Take 1 tablet by mouth daily as needed.   valACYclovir  (VALTREX ) 1000 MG tablet Take 2 tabs (2000 mg) by mouth at onset of first symptoms of cold sore. Take 2 tabs again 12hrs later, then stop.   No facility-administered encounter medications on file as of 04/06/2024.     Assessment and Plan   There are no diagnoses linked to this encounter.     Obesity Treatment and Action Plan:  {EMobesityactionplanscribe:28314::Patient will work on garnering support from family and friends to begin weight loss journey.,Will work on eliminating or reducing the presence of highly  palatable, calorie dense foods in the home.,Will complete provided nutritional and psychosocial assessment questionnaire before the next appointment.,Will be scheduled for indirect calorimetry to determine resting energy expenditure in a fasting state.  This will allow us  to create a reduced calorie, high-protein meal plan to promote loss of fat mass while preserving muscle mass.,Counseled on the health benefits of losing 5%-15% of total body weight.,Was counseled on nutritional approaches to weight loss and benefits of reducing processed foods and consuming plant-based foods and high quality protein as part of nutritional weight management.,Was counseled on pharmacotherapy and role as an adjunct in weight management. }  Education and Additional resources  She was weighed on the bioimpedance scale and results were discussed and documented in the synopsis.  We discussed obesity as a progressive, chronic disease and the importance of a more  detailed evaluation of all the factors contributing to the disease.  We reviewed the basic principles in obesity management.   We discussed the importance of long term lifestyle changes which include nutrition, exercise and behavioral modification as well as the importance of customizing this to her specific health and social needs.  We reviewed the role of medical interventions including pharmacotherapy and surgical interventions.   We discussed the benefits of reaching a healthier weight to alleviate the symptoms of existing conditions and reduce the risks of the biomechanical, cardiometabolic and psychological effects of obesity.  We reviewed our program approach and philosophy, which are guided by the four pillars of obesity medicine.  We discussed how to prepare for intake appointment and the importance of fasting and avoidance of stimulants for at least 8 hours prior to indirect calorimetry.  Robyn Ortiz appears to be in the action stage of change and reports being ready to initiate intensive lifestyle and behavioral modifications as part of their weight loss journey.  Attestation  Reviewed by clinician on day of visit: allergies, medications, problem list, medical history, surgical history, family history, social history, and previous encounter notes pertinent to obesity diagnosis.  I have spent *** minutes in the care of the patient today including: {NUMBER 1-10:22536} minutes before the visit reviewing and preparing the chart. *** minutes face-to-face {emfacetoface:32598::assessing and reviewing listed medical problems as outlined in obesity care plan,providing nutritional and behavioral counseling on topics outlined in the obesity care plan,independently interpreting test results and goals of care, as described in assessment and plan,reviewing and discussing biometric information and progress} {NUMBER 1-10:22536} minutes after the visit updating chart and documentation of  encounter.  Eulon Allnutt ANP-C

## 2024-04-06 ENCOUNTER — Institutional Professional Consult (permissible substitution) (INDEPENDENT_AMBULATORY_CARE_PROVIDER_SITE_OTHER): Admitting: Nurse Practitioner

## 2024-04-06 ENCOUNTER — Encounter (INDEPENDENT_AMBULATORY_CARE_PROVIDER_SITE_OTHER): Payer: Self-pay

## 2024-04-06 ENCOUNTER — Ambulatory Visit: Admitting: Internal Medicine

## 2024-04-06 ENCOUNTER — Ambulatory Visit: Payer: Self-pay

## 2024-04-06 ENCOUNTER — Encounter: Payer: Self-pay | Admitting: Internal Medicine

## 2024-04-06 VITALS — BP 100/60 | HR 64 | Temp 98.0°F | Ht 65.0 in | Wt 188.0 lb

## 2024-04-06 DIAGNOSIS — K2100 Gastro-esophageal reflux disease with esophagitis, without bleeding: Secondary | ICD-10-CM

## 2024-04-06 DIAGNOSIS — K760 Fatty (change of) liver, not elsewhere classified: Secondary | ICD-10-CM

## 2024-04-06 DIAGNOSIS — Z6831 Body mass index (BMI) 31.0-31.9, adult: Secondary | ICD-10-CM

## 2024-04-06 DIAGNOSIS — K529 Noninfective gastroenteritis and colitis, unspecified: Secondary | ICD-10-CM | POA: Insufficient documentation

## 2024-04-06 MED ORDER — PROMETHAZINE HCL 12.5 MG PO TABS: 12.5000 mg | ORAL_TABLET | Freq: Three times a day (TID) | ORAL | 0 refills | Status: DC | PRN

## 2024-04-06 NOTE — Telephone Encounter (Signed)
 NOTED

## 2024-04-06 NOTE — Telephone Encounter (Signed)
 FYI Only or Action Required?: FYI only for provider.  Patient was last seen in primary care on 03/09/2024 by Watt Mirza, MD.  Called Nurse Triage reporting Vomiting and Diarrhea.  Symptoms began today.  Interventions attempted: Rest, hydration, or home remedies.  Symptoms are: gradually worsening.  Triage Disposition: See HCP Within 4 Hours (Or PCP Triage)  Patient/caregiver understands and will follow disposition?: Yes     Copied from CRM #[id number]. Topic: Clinical - Red Word Triage >> Apr 06, 2024 11:09 AM Rosina BIRCH wrote: Reason for RMF:[id number] up, upset stomach and runny bowels Reason for Disposition  [1] Vomiting AND [2] contains bile (green color)  Answer Assessment - Initial Assessment Questions 1. VOMITING SEVERITY: How many times have you vomited in the past 24 hours?      4 x in past 24hrs 2. ONSET: When did the vomiting begin?      This AM 3. FLUIDS: What fluids or food have you vomited up today? Have you been able to keep any fluids down?     no 4. ABDOMEN PAIN: Are your having any abdomen pain? If Yes : How bad is it and what does it feel like? (e.g., crampy, dull, intermittent, constant)      Yes, it feels like contractions - cramping 5. DIARRHEA: Is there any diarrhea? If Yes, ask: How many times today?      6x today - water with clay color stool 6. CONTACTS: Is there anyone else in the family with the same symptoms?      denies 7. CAUSE: What do you think is causing your vomiting?     denies 8. HYDRATION STATUS: Any signs of dehydration? (e.g., dry mouth [not only dry lips], too weak to stand) When did you last urinate?     Unknown UOP - thinks this AM Endorses lightheaded 9. OTHER SYMPTOMS: Do you have any other symptoms? (e.g., fever, headache, vertigo, vomiting blood or coffee grounds, recent head injury)     Throat is burning Dry mouth 10. PREGNANCY: Is there any chance you are pregnant? When was your last  menstrual period?       denies    Triager strongly advised ORS and to go to ED if unable to tolerate ORS/worsening before AV with PCP. Patient verbalized understanding and to call back with questions.  Protocols used: Vomiting-A-AH

## 2024-04-06 NOTE — Assessment & Plan Note (Signed)
 No suspect food---likely viral Reassuring benign exam Discussed keeping up with fluids---and pantoprazole  Will try promethazine  for the nausea

## 2024-04-06 NOTE — Telephone Encounter (Signed)
 See OV  note

## 2024-04-06 NOTE — Progress Notes (Signed)
 Subjective:    Patient ID: Robyn Ortiz, female    DOB: Dec 29, 1985, 38 y.o.   MRN: [id number]  HPI Here due to GI symptoms  Started this morning Got to work at 5:40AM---nausea started then Soon started vomiting and having diarrhea Vomited at least 5 times---- 8 stools or so (watery) Drinking water but then has to move bowels  No fever Some lower abdominal pain when moving bowels--brown without blood Some pain in LUQ when vomiting  Tried ondansetron  and didn't help  Bad tasting burps  Current Outpatient Medications on File Prior to Visit  Medication Sig Dispense Refill   acetaminophen  (TYLENOL ) 325 MG tablet Take 650 mg by mouth every 6 (six) hours as needed for moderate pain.     AIMOVIG  140 MG/ML SOAJ INJECT 140 MILLIGRAM(S) SUBCUTANEOUSLY EVERY 4 WEEKS 3 mL 1   Azelastine -Fluticasone  (DYMISTA ) 137-50 MCG/ACT SUSP PLACE 1 SPRAY INTO THE NOSE EVERY 12 (TWELVE) HOURS. 23 g 2   benzonatate  (TESSALON ) 100 MG capsule Take 1 capsule (100 mg total) by mouth every 8 (eight) hours. 21 capsule 0   cholestyramine  (QUESTRAN ) 4 g packet TAKE 1 PACKET BY MOUTH 2 TIMES DAILY. 180 packet 5   levocetirizine (XYZAL ) 5 MG tablet Take 1 tablet (5 mg total) by mouth every evening. 90 tablet 3   montelukast (SINGULAIR) 10 MG tablet TAKE 1 TABLET BY MOUTH EVERY DAY 90 tablet 1   mupirocin  ointment (BACTROBAN ) 2 % APPLY TO AFFECTED AREA TWICE A DAY 22 g 0   ondansetron  (ZOFRAN ) 4 MG tablet Take 1 tablet (4 mg total) by mouth every 6 (six) hours. 12 tablet 0   pantoprazole  (PROTONIX ) 40 MG tablet TAKE 1 TABLET BY MOUTH TWICE A DAY 180 tablet 1   phentermine  (ADIPEX-P ) 37.5 MG tablet Take 1 tablet (37.5 mg total) by mouth daily before breakfast. 30 tablet 0   PROAIR  HFA 108 (90 Base) MCG/ACT inhaler Inhale 2 puffs into the lungs every 6 (six) hours as needed for wheezing or shortness of breath.     promethazine -dextromethorphan  (PROMETHAZINE -DM) 6.25-15 MG/5ML syrup Take 5 mLs by mouth 4 (four) times  daily as needed for cough. 118 mL 0   rizatriptan  (MAXALT ) 5 MG tablet TAKE 1 TABLET BY MOUTH AS NEEDED FOR MIGRAINE. MAY REPEAT IN 2 HOURS IF NEEDED 2 tablet 4   tiZANidine  (ZANAFLEX ) 4 MG tablet Take 1 tablet (4 mg total) by mouth at bedtime as needed for muscle spasms. 30 tablet 1   UBRELVY 100 MG TABS Take 1 tablet by mouth daily as needed.     valACYclovir  (VALTREX ) 1000 MG tablet Take 2 tabs (2000 mg) by mouth at onset of first symptoms of cold sore. Take 2 tabs again 12hrs later, then stop. 30 tablet 1   No current facility-administered medications on file prior to visit.    Allergies  Allergen Reactions   Codeine Other (See Comments)    Pt. States she had a seizure   Latex     Past Medical History:  Diagnosis Date   Diverticulitis    GERD (gastroesophageal reflux disease)    History of bleeding ulcers    told not to take ibuprofen    History of bronchitis 38yrs ago   Migraine    Obesity    Sleep apnea    CPAP    Past Surgical History:  Procedure Laterality Date   CESAREAN SECTION N/A 04/08/2015   Procedure: CESAREAN SECTION;  Surgeon: Winton Felt, MD;  Location: [facility];  Service: Obstetrics;  Laterality: N/A;   CESAREAN SECTION WITH BILATERAL TUBAL LIGATION  04/08/2015   CHOLECYSTECTOMY  03/27/2014   Dr Dessa   COLONOSCOPY WITH PROPOFOL  N/A 12/18/2021   Procedure: COLONOSCOPY WITH PROPOFOL ;  Surgeon: Unk Corinn Skiff, MD;  Location: [facility] ENDOSCOPY;  Service: Gastroenterology;  Laterality: N/A;   ESOPHAGOGASTRODUODENOSCOPY N/A 05/19/2017   Procedure: ESOPHAGOGASTRODUODENOSCOPY (EGD);  Surgeon: Unk Corinn Skiff, MD;  Location[facility];  Service: Endoscopy;  Laterality: N/A;   ESOPHAGOGASTRODUODENOSCOPY (EGD) WITH PROPOFOL  N/A 12/18/2021   Procedure: ESOPHAGOGASTRODUODENOSCOPY (EGD) WITH PROPOFOL ;  Surgeon: Unk Corinn Skiff, MD;  Location: [facility] ENDOSCOPY;  Service: Gastroenterology;  Laterality: N/A;   GRADUATED/TOLERANCE TEST  03/06/2022   Good  exercise tolerance-10:24 min.  Reached peak heart rate of 169 bpm equals 91% MPHR at 186 bpm.  No EKG changes, chest pain or dyspnea.   TONSILLECTOMY  03/10/2012   Procedure: TONSILLECTOMY;  Surgeon: Norleen Notice, MD;  Location: [facility] OR;  Service: ENT;  Laterality: Bilateral;    Family History  Problem Relation Age of Onset   Bipolar disorder Mother    Schizophrenia Mother    Diabetes Father    Heart disease Sister    Suicidality Brother    Heart disease Maternal Grandmother    Alcohol abuse Maternal Grandfather    Gastric cancer Paternal Grandfather    Arthritis Paternal Grandfather     Social History   Socioeconomic History   Marital status: Married    Spouse name: Not on file   Number of children: Not on file   Years of education: Not on file   Highest education level: GED or equivalent  Occupational History   Occupation: [employer]    Comment: Civil Service fast streamer  Tobacco Use   Smoking status: Former    Current packs/day: 1.00    Average packs/day: 1 pack/day for 13.0 years (13.0 ttl pk-yrs)    Types: Cigarettes   Smokeless tobacco: Never  Vaping Use   Vaping status: Every Day  Substance and Sexual Activity   Alcohol use: Yes    Comment: beer occasionally   Drug use: No   Sexual activity: Yes    Birth control/protection: Surgical    Comment: BTL  Other Topics Concern   Not on file  Social History Narrative   3 biological    1 step    And two adopted (niece and nephew)   Social Drivers of Corporate investment banker Strain: Low Risk  (03/09/2024)   Overall Financial Resource Strain (CARDIA)    Difficulty of Paying Living Expenses: Not hard at all  Food Insecurity: No Food Insecurity (03/09/2024)   Hunger Vital Sign    Worried About Running Out of Food in the Last Year: Never true    Ran Out of Food in the Last Year: Never true  Transportation Needs: No Transportation Needs (03/09/2024)   PRAPARE - Administrator, Civil Service (Medical): No    Lack  of Transportation (Non-Medical): No  Physical Activity: Sufficiently Active (03/09/2024)   Exercise Vital Sign    Days of Exercise per Week: 5 days    Minutes of Exercise per Session: 120 min  Stress: No Stress Concern Present (03/09/2024)   [employer] of Occupational Health - Occupational Stress Questionnaire    Feeling of Stress: Not at all  Social Connections: Unknown (03/09/2024)   Social Connection and Isolation Panel    Frequency of Communication with Friends and Family: More than three times a week    Frequency of  Social Gatherings with Friends and Family: More than three times a week    Attends Religious Services: Patient declined    Database administrator or Organizations: Patient declined    Attends Engineer, structural: Not on file    Marital Status: Married  Catering manager Violence: Not on file   Review of Systems Did have chicken last night---cooked on grill and oven (thinks it was well cooked) Was at beach--but came back 4 days ago No cough or SOB No one else with illness    Objective:   Physical Exam Constitutional:      Appearance: Normal appearance.  Pulmonary:     Effort: Pulmonary effort is normal.     Breath sounds: Normal breath sounds. No wheezing or rales.  Abdominal:     General: Bowel sounds are normal. There is no distension.     Palpations: Abdomen is soft.     Tenderness: There is no abdominal tenderness. There is no guarding or rebound.  Musculoskeletal:     Cervical back: Neck supple.  Lymphadenopathy:     Cervical: No cervical adenopathy.  Neurological:     Mental Status: She is alert.            Assessment & Plan:

## 2024-04-09 ENCOUNTER — Other Ambulatory Visit: Payer: Self-pay | Admitting: Family

## 2024-04-09 DIAGNOSIS — E66811 Obesity, class 1: Secondary | ICD-10-CM

## 2024-04-10 ENCOUNTER — Encounter: Payer: Self-pay | Admitting: Family

## 2024-04-18 ENCOUNTER — Telehealth: Payer: Self-pay | Admitting: Family

## 2024-04-18 ENCOUNTER — Telehealth: Payer: Self-pay

## 2024-04-18 ENCOUNTER — Other Ambulatory Visit (HOSPITAL_COMMUNITY): Payer: Self-pay

## 2024-04-18 ENCOUNTER — Ambulatory Visit: Admitting: Family

## 2024-04-18 VITALS — BP 102/72 | HR 84 | Temp 98.4°F | Ht 65.0 in | Wt 191.6 lb

## 2024-04-18 DIAGNOSIS — T753XXA Motion sickness, initial encounter: Secondary | ICD-10-CM | POA: Diagnosis not present

## 2024-04-18 DIAGNOSIS — E6609 Other obesity due to excess calories: Secondary | ICD-10-CM | POA: Diagnosis not present

## 2024-04-18 DIAGNOSIS — K2100 Gastro-esophageal reflux disease with esophagitis, without bleeding: Secondary | ICD-10-CM

## 2024-04-18 DIAGNOSIS — Z6830 Body mass index (BMI) 30.0-30.9, adult: Secondary | ICD-10-CM | POA: Diagnosis not present

## 2024-04-18 DIAGNOSIS — E66811 Obesity, class 1: Secondary | ICD-10-CM | POA: Diagnosis not present

## 2024-04-18 MED ORDER — SCOPOLAMINE 1 MG/3DAYS TD PT72: 1.0000 | MEDICATED_PATCH | TRANSDERMAL | 0 refills | Status: AC

## 2024-04-18 MED ORDER — ZEPBOUND 7.5 MG/0.5ML ~~LOC~~ SOAJ: 7.5000 mg | SUBCUTANEOUS | 1 refills | Status: DC

## 2024-04-18 MED ORDER — ZEPBOUND 2.5 MG/0.5ML ~~LOC~~ SOAJ: 2.5000 mg | SUBCUTANEOUS | 0 refills | Status: DC

## 2024-04-18 MED ORDER — DEXLANSOPRAZOLE 30 MG PO CPDR
30.0000 mg | DELAYED_RELEASE_CAPSULE | Freq: Every day | ORAL | 0 refills | Status: DC
Start: 2024-04-18 — End: 2024-07-10

## 2024-04-18 MED ORDER — ZEPBOUND 5 MG/0.5ML ~~LOC~~ SOAJ: 5.0000 mg | SUBCUTANEOUS | 0 refills | Status: DC

## 2024-04-18 NOTE — Telephone Encounter (Signed)
 Send p/a for dexilant   Failure lansoprazole, esomeprazole and omeprazole  and pantoprazole 

## 2024-04-18 NOTE — Telephone Encounter (Signed)
 Send p/a for zepbound  Has tried wegovy  with failure

## 2024-04-18 NOTE — Telephone Encounter (Signed)
 Pharmacy Patient Advocate Encounter   Received notification from Pt Calls Messages that prior authorization for Dexilant  30 mg cap is required/requested.   Insurance verification completed.   The patient is insured through [facility] .   Per test claim: Refill too soon. PA is not needed at this time. Medication was filled 04/18/24. Next eligible fill date is 06/24/24.

## 2024-04-18 NOTE — Progress Notes (Signed)
 Established Patient Office Visit  Subjective:      CC:  Chief Complaint  Patient presents with   Medical Management of Chronic Issues    Discuss zepbound     HPI: Robyn Ortiz is a 38 y.o. female presenting on 04/18/2024 for Medical Management of Chronic Issues (Discuss zepbound ) .  Discussed the use of AI scribe software for clinical note transcription with the patient, who gave verbal consent to proceed.  History of Present Illness Robyn Ortiz is a 38 year old female with obesity and gastroesophageal reflux disease who presents for weight management and persistent heartburn.  She is seeking medical attention for weight management. She has previously tried phentermine  for three months and Wegovy , both of which did not result in significant weight loss. She engages in physical activities such as baseball practice and using the gym at work when possible, but finds it challenging to maintain a consistent exercise routine. She has an upcoming appointment with a dietitian on September 18th.  She experiences persistent heartburn, which has worsened recently. She is currently taking pantoprazole  40 mg, which was effective initially but is no longer providing relief. She reports a history of esophagitis and erosive gastropathy, and recalls having an endoscopy on Dec 18, 2021. She has tried multiple acid-reducing medications in the past, including Prilosec, Nexium, and lansoprazole, with limited success. No history of thyroid  cancer, MEN syndrome, or seizure disorders.  She experiences occasional nausea, which she attributes to a recent COVID-19 infection. No current constipation issues, as she takes cholesterol impacts to manage bowel movements. She has a history of severe headaches, with an MRI showing no significant abnormalities. She suspects potential triggers such as vaping or exposure to smoke from her partner, Carlin.  She has a history of sleep apnea diagnosed in 2018, but a recent  sleep study within the last year showed no obstructive sleep apnea. She reports feeling tired throughout the day. She continues to vape, which she acknowledges could be a trigger for her headaches.  Wt Readings from Last 3 Encounters:  04/18/24 191 lb 9.6 oz (86.9 kg)  04/06/24 188 lb (85.3 kg)  03/09/24 191 lb 2 oz (86.7 kg)             Social history:  Relevant past medical, surgical, family and social history reviewed and updated as indicated. Interim medical history since our last visit reviewed.  Allergies and medications reviewed and updated.  DATA REVIEWED: CHART IN EPIC     ROS: Negative unless specifically indicated above in HPI.    Current Outpatient Medications:    acetaminophen  (TYLENOL ) 325 MG tablet, Take 650 mg by mouth every 6 (six) hours as needed for moderate pain., Disp: , Rfl:    AIMOVIG  140 MG/ML SOAJ, INJECT 140 MILLIGRAM(S) SUBCUTANEOUSLY EVERY 4 WEEKS, Disp: 3 mL, Rfl: 1   Azelastine -Fluticasone  (DYMISTA ) 137-50 MCG/ACT SUSP, PLACE 1 SPRAY INTO THE NOSE EVERY 12 (TWELVE) HOURS., Disp: 23 g, Rfl: 2   cholestyramine  (QUESTRAN ) 4 g packet, TAKE 1 PACKET BY MOUTH 2 TIMES DAILY., Disp: 180 packet, Rfl: 5   Dexlansoprazole  30 MG capsule DR, Take 1 capsule (30 mg total) by mouth daily., Disp: 90 capsule, Rfl: 0   levocetirizine (XYZAL ) 5 MG tablet, Take 1 tablet (5 mg total) by mouth every evening., Disp: 90 tablet, Rfl: 3   montelukast (SINGULAIR) 10 MG tablet, TAKE 1 TABLET BY MOUTH EVERY DAY, Disp: 90 tablet, Rfl: 1   mupirocin  ointment (BACTROBAN ) 2 %, APPLY TO AFFECTED AREA TWICE A  DAY, Disp: 22 g, Rfl: 0   ondansetron  (ZOFRAN ) 4 MG tablet, Take 1 tablet (4 mg total) by mouth every 6 (six) hours., Disp: 12 tablet, Rfl: 0   PROAIR  HFA 108 (90 Base) MCG/ACT inhaler, Inhale 2 puffs into the lungs every 6 (six) hours as needed for wheezing or shortness of breath., Disp: , Rfl:    rizatriptan  (MAXALT ) 5 MG tablet, TAKE 1 TABLET BY MOUTH AS NEEDED FOR  MIGRAINE. MAY REPEAT IN 2 HOURS IF NEEDED, Disp: 2 tablet, Rfl: 4   scopolamine  (TRANSDERM-SCOP) 1 MG/3DAYS, Place 1 patch (1 mg total) onto the skin every 3 (three) days., Disp: 10 patch, Rfl: 0   tirzepatide  (ZEPBOUND ) 2.5 MG/0.5ML Pen, Inject 2.5 mg into the skin once a week. Inject 0.25 mg Ambler weekly for four weeks, then increase to 0.5 mg weekly, Disp: 2 mL, Rfl: 0   [START ON 05/09/2024] tirzepatide  (ZEPBOUND ) 5 MG/0.5ML Pen, Inject 5 mg into the skin once a week. Inject 0.5 mg  weekly, Disp: 2 mL, Rfl: 0   [START ON 05/30/2024] tirzepatide  (ZEPBOUND ) 7.5 MG/0.5ML Pen, Inject 7.5 mg into the skin once a week., Disp: 2 mL, Rfl: 1   tiZANidine  (ZANAFLEX ) 4 MG tablet, Take 1 tablet (4 mg total) by mouth at bedtime as needed for muscle spasms., Disp: 30 tablet, Rfl: 1   UBRELVY 100 MG TABS, Take 1 tablet by mouth daily as needed., Disp: , Rfl:    valACYclovir  (VALTREX ) 1000 MG tablet, Take 2 tabs (2000 mg) by mouth at onset of first symptoms of cold sore. Take 2 tabs again 12hrs later, then stop., Disp: 30 tablet, Rfl: 1        Objective:        BP 102/72 (BP Location: Right Arm, Patient Position: Sitting, Cuff Size: Normal)   Pulse 84   Temp 98.4 F (36.9 C) (Temporal)   Ht 5' 5 (1.651 m)   Wt 191 lb 9.6 oz (86.9 kg)   LMP 03/20/2024 (Approximate)   SpO2 96%   BMI 31.88 kg/m   Physical Exam MEASUREMENTS: Weight- 191. ABDOMEN: Tenderness in the abdomen.  Wt Readings from Last 3 Encounters:  04/18/24 191 lb 9.6 oz (86.9 kg)  04/06/24 188 lb (85.3 kg)  03/09/24 191 lb 2 oz (86.7 kg)    Physical Exam Vitals reviewed.  Constitutional:      General: She is not in acute distress.    Appearance: Normal appearance. She is normal weight. She is not ill-appearing, toxic-appearing or diaphoretic.  HENT:     Head: Normocephalic.  Cardiovascular:     Rate and Rhythm: Normal rate and regular rhythm.  Pulmonary:     Effort: Pulmonary effort is normal.  Abdominal:     General:  Bowel sounds are normal.     Tenderness: There is abdominal tenderness in the epigastric area. There is no guarding or rebound.  Musculoskeletal:        General: Normal range of motion.  Neurological:     General: No focal deficit present.     Mental Status: She is alert and oriented to person, place, and time. Mental status is at baseline.  Psychiatric:        Mood and Affect: Mood normal.        Behavior: Behavior normal.        Thought Content: Thought content normal.        Judgment: Judgment normal.          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 Current weight is 191 lbs. No personal or family history of thyroid  cancer, MEN syndrome, or seizure disorders. No significant side effects from Wegovy  except mild, self-limited nausea. Limited success with lifestyle modifications including exercise and dietary changes. Referral to a dietitian is scheduled for September 18th. - Send prior authorization for Zepbound  as a new weight management medication. - Encourage continuation of lifestyle modifications including regular exercise and dietary changes. - Follow up with weight check within three months of starting Zepbound  to comply with Medicaid requirements.  Gastroesophageal reflux disease with esophagitis and erosive gastropathy Persistent gastroesophageal  reflux disease with esophagitis and erosive gastropathy. Recent exacerbation of heartburn symptoms despite pantoprazole  40 mg. Previous endoscopy on Dec 18, 2021, showed esophagitis without bleeding, erosive gastropathy, and no significant histopathologic changes. Concern for potential progression to Barrett's esophagus if not managed effectively. - Send prior authorization for Dexilant  due to failure of multiple proton pump inhibitors. - Refer back to gastroenterology for further evaluation and management. - Advise to follow up with gastroenterology if current provider is unavailable or if there are delays in scheduling.  Nausea Intermittent nausea, possibly related to recent COVID-19 infection. No significant nausea reported with previous use of Wegovy .  Motion sickness (prophylaxis for cruise) Motion sickness during cruises. Previous use of scopolamine  patches. - Prescribe scopolamine  patches for upcoming cruise. Instruct to apply one patch four hours prior to cruise and change every 72 hours.  Headache, unspecified Unspecified headaches with no significant abnormalities on MRI. Possible triggers include environmental factors such as vaping or exposure to smoke. No obstructive sleep apnea confirmed by recent sleep study. - Advise keeping a headache diary to identify potential triggers. - Discuss potential environmental and dietary triggers for headaches.  Recording duration: 18 minutes      Return in about 3 months (around 07/18/2024) for f/u weight loss medication.     Ginger Patrick, MSN, APRN, FNP-C [facility]

## 2024-04-18 NOTE — Telephone Encounter (Signed)
 Pharmacy Patient Advocate Encounter   Received notification from Physician's Office that prior authorization for Zepbound  2.5 is required/requested.   Insurance verification completed.   The patient is insured through [facility] MEDICAID .   Per test claim: PA required; PA submitted to above mentioned insurance via Latent Key/confirmation #/EOC [id number] Status is pending

## 2024-04-19 ENCOUNTER — Other Ambulatory Visit (HOSPITAL_COMMUNITY): Payer: Self-pay

## 2024-04-19 NOTE — Telephone Encounter (Signed)
 MyChart message has been sent to the pt making her aware of her PA decision.

## 2024-04-19 NOTE — Telephone Encounter (Signed)
 Pharmacy Patient Advocate Encounter  Received notification from [facility] MEDICAID that Prior Authorization for Zepbound  2.5 has been APPROVED from 04/18/24 to 10/15/24. Ran test claim, Copay is $4.00. This test claim was processed through [facility]- copay amounts may vary at other pharmacies due to pharmacy/plan contracts, or as the patient moves through the different stages of their insurance plan.     PA #/Case ID/Reference #: [id number]

## 2024-05-02 ENCOUNTER — Other Ambulatory Visit: Payer: Self-pay | Admitting: Obstetrics

## 2024-05-02 DIAGNOSIS — B001 Herpesviral vesicular dermatitis: Secondary | ICD-10-CM

## 2024-05-04 ENCOUNTER — Institutional Professional Consult (permissible substitution) (INDEPENDENT_AMBULATORY_CARE_PROVIDER_SITE_OTHER): Admitting: Nurse Practitioner

## 2024-05-08 ENCOUNTER — Other Ambulatory Visit (HOSPITAL_COMMUNITY): Payer: Self-pay

## 2024-06-07 ENCOUNTER — Telehealth (INDEPENDENT_AMBULATORY_CARE_PROVIDER_SITE_OTHER): Admitting: Family

## 2024-06-07 ENCOUNTER — Encounter: Payer: Self-pay | Admitting: Family

## 2024-06-07 DIAGNOSIS — H538 Other visual disturbances: Secondary | ICD-10-CM | POA: Insufficient documentation

## 2024-06-07 DIAGNOSIS — H5213 Myopia, bilateral: Secondary | ICD-10-CM | POA: Insufficient documentation

## 2024-06-07 NOTE — Progress Notes (Signed)
 Virtual Visit via Video note  I connected with Robyn Ortiz on 06/07/24 at work by video and verified that I am speaking with the correct person using two identifiers.The provider, Ginger Patrick, FNP is located in their office at time of visit.  I discussed the limitations, risks, security and privacy concerns of performing an evaluation and management service by video and the availability of in person appointments. I also discussed with the patient that there may be a patient responsible charge related to this service. The patient expressed understanding and agreed to proceed.  Subjective: PCP: Patrick Ginger, FNP  Chief Complaint  Patient presents with   Medical Management of Chronic Issues    Needs a refill for an eye doctor. She is having issues with her vision and feels like this is contributing her migraines.    HPI  Pt with frequent migraines and  Has noticed her left eye is blurrier than the right one. Her last eye exam was years ago and she is noticing when she is reading she has to squint and has had to hold the phone closer than usual.   She noticed this started about two weeks ago  She thinks this probably has been going on for some time but she has been paying more attention to it because of the frequency of her migraines.  She does state in the past she would get a 'film over her eye' which would go away after some time. She states she has noticed this over the last few months. She has noticed floaters but this is typically only with the onset of migraines. She denies any change in peripheral vision. She does report eye dryness, and uses clear eye otc which helps some.  Migraines, at times with aura, will get light and sound sensitive, she will get nauseous at times, no weakness in upper or lower extremities.  Sees neurology and on aimovig  and uses her ubrelvy as needed. If she takes the [place] in time it will take a few hours to go away otherwise it can last the whole  day. She notices the frequency is at least 3-4 days a week.   Blood pressure well controlled per her report, typically runs hypotensive.    ROS: Per HPI  Current Outpatient Medications:    acetaminophen  (TYLENOL ) 325 MG tablet, Take 650 mg by mouth every 6 (six) hours as needed for moderate pain., Disp: , Rfl:    AIMOVIG  140 MG/ML SOAJ, INJECT 140 MILLIGRAM(S) SUBCUTANEOUSLY EVERY 4 WEEKS, Disp: 3 mL, Rfl: 1   Azelastine -Fluticasone  (DYMISTA ) 137-50 MCG/ACT SUSP, PLACE 1 SPRAY INTO THE NOSE EVERY 12 (TWELVE) HOURS., Disp: 23 g, Rfl: 2   cholestyramine  (QUESTRAN ) 4 g packet, TAKE 1 PACKET BY MOUTH 2 TIMES DAILY., Disp: 180 packet, Rfl: 5   Dexlansoprazole  30 MG capsule DR, Take 1 capsule (30 mg total) by mouth daily., Disp: 90 capsule, Rfl: 0   levocetirizine (XYZAL ) 5 MG tablet, Take 1 tablet (5 mg total) by mouth every evening., Disp: 90 tablet, Rfl: 3   montelukast (SINGULAIR) 10 MG tablet, TAKE 1 TABLET BY MOUTH EVERY DAY, Disp: 90 tablet, Rfl: 1   mupirocin  ointment (BACTROBAN ) 2 %, APPLY TO AFFECTED AREA TWICE A DAY, Disp: 22 g, Rfl: 0   ondansetron  (ZOFRAN ) 4 MG tablet, Take 1 tablet (4 mg total) by mouth every 6 (six) hours., Disp: 12 tablet, Rfl: 0   PROAIR  HFA 108 (90 Base) MCG/ACT inhaler, Inhale 2 puffs into the lungs every 6 (six)  hours as needed for wheezing or shortness of breath., Disp: , Rfl:    rizatriptan  (MAXALT ) 5 MG tablet, TAKE 1 TABLET BY MOUTH AS NEEDED FOR MIGRAINE. MAY REPEAT IN 2 HOURS IF NEEDED, Disp: 2 tablet, Rfl: 4   scopolamine  (TRANSDERM-SCOP) 1 MG/3DAYS, Place 1 patch (1 mg total) onto the skin every 3 (three) days., Disp: 10 patch, Rfl: 0   tirzepatide  (ZEPBOUND ) 5 MG/0.5ML Pen, Inject 5 mg into the skin once a week. Inject 0.5 mg [place] weekly, Disp: 2 mL, Rfl: 0   tiZANidine  (ZANAFLEX ) 4 MG tablet, Take 1 tablet (4 mg total) by mouth at bedtime as needed for muscle spasms., Disp: 30 tablet, Rfl: 1   UBRELVY 100 MG TABS, Take 1 tablet by mouth daily as needed.,  Disp: , Rfl:    valACYclovir  (VALTREX ) 1000 MG tablet, TAKE 2 TABS (2000 MG) BY MOUTH AT ONSET OF FIRST SYMPTOMS OF COLD SORE. TAKE 2 TABS AGAIN 12HRS LATER, THEN STOP., Disp: 30 tablet, Rfl: 1  Observations/Objective: Physical Exam Constitutional:      General: She is not in acute distress.    Appearance: Normal appearance. She is not ill-appearing.  Eyes:     General: Lids are normal. No allergic shiner.    Extraocular Movements: Extraocular movements intact.  Pulmonary:     Effort: Pulmonary effort is normal.  Neurological:     General: No focal deficit present.     Mental Status: She is alert and oriented to person, place, and time.  Psychiatric:        Mood and Affect: Mood normal.        Behavior: Behavior normal.        Thought Content: Thought content normal.     Wt Readings from Last 3 Encounters:  04/18/24 191 lb 9.6 oz (86.9 kg)  04/06/24 188 lb (85.3 kg)  03/09/24 191 lb 2 oz (86.7 kg)   Temp Readings from Last 3 Encounters:  04/18/24 98.4 F (36.9 C) (Temporal)  04/06/24 98 F (36.7 C) (Oral)  03/28/24 98 F (36.7 C) (Oral)   BP Readings from Last 3 Encounters:  04/18/24 102/72  04/06/24 100/60  03/28/24 95/65   Pulse Readings from Last 3 Encounters:  04/18/24 84  04/06/24 64  03/28/24 65    Assessment and Plan: Blurry vision Assessment & Plan: Suspect subacute to acute however did advise pt of red flag precautions (loss of peripheral fields, sudden vision loss, black curtain vision) Suspect eye exam needed to r/o need for glasses which may be contributing to migraines.  Referral to optometrist.  Blood pressure likely not an issue as they seem well controlled per history.   Orders: -     Ambulatory referral to Optometry  Myopia of both eyes -     Ambulatory referral to Optometry    Follow Up Instructions: Return if symptoms worsen or fail to improve.   I discussed the assessment and treatment plan with the patient. The patient was  provided an opportunity to ask questions and all were answered. The patient agreed with the plan and demonstrated an understanding of the instructions.   The patient was advised to call back or seek an in-person evaluation if the symptoms worsen or if the condition fails to improve as anticipated.  The above assessment and management plan was discussed with the patient. The patient verbalized understanding of and has agreed to the management plan. Patient is aware to call the clinic if symptoms persist or worsen. Patient is aware  when to return to the clinic for a follow-up visit. Patient educated on when it is appropriate to go to the emergency department.     Ginger Patrick, MSN, APRN, FNP-C [facility]

## 2024-06-07 NOTE — Assessment & Plan Note (Signed)
 Suspect subacute to acute however did advise pt of red flag precautions (loss of peripheral fields, sudden vision loss, black curtain vision) Suspect eye exam needed to r/o need for glasses which may be contributing to migraines.  Referral to optometrist.  Blood pressure likely not an issue as they seem well controlled per history.

## 2024-06-15 ENCOUNTER — Other Ambulatory Visit (HOSPITAL_COMMUNITY): Payer: Self-pay

## 2024-06-15 ENCOUNTER — Telehealth: Payer: Self-pay

## 2024-06-15 NOTE — Telephone Encounter (Signed)
 Pharmacy Patient Advocate Encounter   Received notification from Onbase that prior authorization for Dymista  is required/requested.   Insurance verification completed.   The patient is insured through [employer].   Per test claim: The current 25 day co-pay is, $4.00.  No PA needed at this time. This test claim was processed through [facility]- copay amounts may vary at other pharmacies due to pharmacy/plan contracts, or as the patient moves through the different stages of their insurance plan.     Patient plan prefers brand. Pharmacy must run DAW 1

## 2024-06-20 ENCOUNTER — Ambulatory Visit
Admission: EM | Admit: 2024-06-20 | Discharge: 2024-06-20 | Disposition: A | Discharge status: Home / Self Care | Attending: Emergency Medicine | Admitting: Emergency Medicine

## 2024-06-20 DIAGNOSIS — J069 Acute upper respiratory infection, unspecified: Secondary | ICD-10-CM

## 2024-06-20 LAB — POC COVID19/FLU A&B COMBO
Covid Antigen, POC: NEGATIVE
Influenza A Antigen, POC: NEGATIVE
Influenza B Antigen, POC: NEGATIVE

## 2024-06-20 LAB — POCT RAPID STREP A (OFFICE): Rapid Strep A Screen: NEGATIVE

## 2024-06-20 MED ORDER — CYCLOBENZAPRINE HCL 10 MG PO TABS: 10.0000 mg | ORAL_TABLET | Freq: Two times a day (BID) | ORAL | 0 refills | Status: AC | PRN

## 2024-06-20 MED ORDER — PREDNISONE 10 MG (21) PO TBPK: ORAL_TABLET | Freq: Every day | ORAL | 0 refills | Status: DC

## 2024-06-20 NOTE — ED Triage Notes (Signed)
 Sore throat, congestion, body aches, chills, nausea x 1 day. Taking tylenol  and ibuprofen .

## 2024-06-20 NOTE — ED Provider Notes (Signed)
 [facility]    CSN: [id number] Arrival date & time: 06/20/24  1037      History   Chief Complaint Chief Complaint  Patient presents with   Sore Throat   Nasal Congestion    HPI Robyn Ortiz is a 38 y.o. female.   Patient presents for evaluation of subjective fever, chills, body aches, nasal congestion, nonproductive cough, sore throat, nausea without vomiting, intermittent headaches and left-sided ear pain occurring intermittently beginning 1 day ago.  Experiencing shortness of breath and wheezing when lying down.  Known sick contact in household.  Tolerable to food and liquids but appetite is decreased.  Has attempted use of hot tea, Tylenol  and ibuprofen .  Former smoker.  Past Medical History:  Diagnosis Date   Diverticulitis    GERD (gastroesophageal reflux disease)    History of bleeding ulcers    told not to take ibuprofen    History of bronchitis 66yrs ago   Migraine    Obesity    Sleep apnea    CPAP    Patient Active Problem List   Diagnosis Date Noted   Myopia of both eyes 06/07/2024   Blurry vision 06/07/2024   Gastroenteritis 04/06/2024   Abnormal serum thyroxine (T4) level 11/12/2023   Daytime sleepiness 11/12/2023   Menorrhagia with regular cycle 10/08/2023   Abnormal uterine bleeding (AUB) 10/08/2023   Deviated nasal septum 09/25/2023   Hypertrophy of nasal turbinates 09/25/2023   Allergic rhinitis 08/13/2023   Gastroesophageal reflux disease with esophagitis without hemorrhage 08/13/2023   Class 1 obesity due to excess calories without serious comorbidity with body mass index (BMI) of 30.0 to 30.9 in adult 03/26/2023   Vapes nicotine  containing substance 02/11/2023   GAD (generalized anxiety disorder) 02/11/2023   Eczema 09/04/2014   Asthma 11/21/2012   Ex-smoker 11/21/2012   Condyloma acuminatum of perianal region 11/21/2012   Migraine 11/21/2012    Past Surgical History:  Procedure Laterality Date   CESAREAN SECTION N/A 04/08/2015    Procedure: CESAREAN SECTION;  Surgeon: Winton Felt, MD;  Location: [facility];  Service: Obstetrics;  Laterality: N/A;   CESAREAN SECTION WITH BILATERAL TUBAL LIGATION  04/08/2015   CHOLECYSTECTOMY  03/27/2014   Dr Dessa   COLONOSCOPY WITH PROPOFOL  N/A 12/18/2021   Procedure: COLONOSCOPY WITH PROPOFOL ;  Surgeon: Unk Corinn Skiff, MD;  Location: [facility];  Service: Gastroenterology;  Laterality: N/A;   ESOPHAGOGASTRODUODENOSCOPY N/A 05/19/2017   Procedure: ESOPHAGOGASTRODUODENOSCOPY (EGD);  Surgeon: Unk Corinn Skiff, MD;  Location: [facility];  Service: Endoscopy;  Laterality: N/A;   ESOPHAGOGASTRODUODENOSCOPY (EGD) WITH PROPOFOL  N/A 12/18/2021   Procedure: ESOPHAGOGASTRODUODENOSCOPY (EGD) WITH PROPOFOL ;  Surgeon: Unk Corinn Skiff, MD;  Location: [facility] ENDOSCOPY;  Service: Gastroenterology;  Laterality: N/A;   GRADUATED/TOLERANCE TEST  03/06/2022   Good exercise tolerance-10:24 min.  Reached peak heart rate of 169 bpm equals 91% MPHR at 186 bpm.  No EKG changes, chest pain or dyspnea.   TONSILLECTOMY  03/10/2012   Procedure: TONSILLECTOMY;  Surgeon: Norleen Notice, MD;  Location: [facility] OR;  Service: ENT;  Laterality: Bilateral;    OB History     Gravida  3   Para  3   Term  3   Preterm      AB      Living  3      SAB      IAB      Ectopic      Multiple  0   Live Births  3  Obstetric Comments  1st Menstrual Cycle:  14  1st Pregnancy:  17          Home Medications    Prior to Admission medications   Medication Sig Start Date End Date Taking? Authorizing Provider  AIMOVIG  140 MG/ML SOAJ INJECT 140 MILLIGRAM(S) SUBCUTANEOUSLY EVERY 4 WEEKS 01/18/24  Yes Dugal, Tabitha, FNP  levocetirizine (XYZAL ) 5 MG tablet Take 1 tablet (5 mg total) by mouth every evening. 11/12/23  Yes Dugal, Tabitha, FNP  montelukast (SINGULAIR) 10 MG tablet TAKE 1 TABLET BY MOUTH EVERY DAY 02/10/24  Yes Dugal, Tabitha, FNP  tirzepatide  (ZEPBOUND ) 5 MG/0.5ML Pen Inject  5 mg into the skin once a week. Inject 0.5 mg [place] weekly 05/09/24  Yes Dugal, Tabitha, FNP  acetaminophen  (TYLENOL ) 325 MG tablet Take 650 mg by mouth every 6 (six) hours as needed for moderate pain.    [provider]  Azelastine -Fluticasone  (DYMISTA ) 137-50 MCG/ACT SUSP PLACE 1 SPRAY INTO THE NOSE EVERY 12 (TWELVE) HOURS. 04/03/24   Corwin Antu, FNP  cholestyramine  (QUESTRAN ) 4 g packet TAKE 1 PACKET BY MOUTH 2 TIMES DAILY. 05/20/23   Unk Corinn Skiff, MD  Dexlansoprazole  30 MG capsule DR Take 1 capsule (30 mg total) by mouth daily. 04/18/24   Dugal, Tabitha, FNP  mupirocin  ointment (BACTROBAN ) 2 % APPLY TO AFFECTED AREA TWICE A DAY 02/11/24   Corwin Antu, FNP  ondansetron  (ZOFRAN ) 4 MG tablet Take 1 tablet (4 mg total) by mouth every 6 (six) hours. 07/15/23   Neysa Caron PARAS, DO  PROAIR  HFA 108 (90 Base) MCG/ACT inhaler Inhale 2 puffs into the lungs every 6 (six) hours as needed for wheezing or shortness of breath. 02/11/23   Dugal, Antu, FNP  rizatriptan  (MAXALT ) 5 MG tablet TAKE 1 TABLET BY MOUTH AS NEEDED FOR MIGRAINE. MAY REPEAT IN 2 HOURS IF NEEDED 02/10/24   Corwin Antu, FNP  scopolamine  (TRANSDERM-SCOP) 1 MG/3DAYS Place 1 patch (1 mg total) onto the skin every 3 (three) days. 04/18/24   Dugal, Tabitha, FNP  tiZANidine  (ZANAFLEX ) 4 MG tablet Take 1 tablet (4 mg total) by mouth at bedtime as needed for muscle spasms. 03/09/24   Copland, Jacques, MD  UBRELVY 100 MG TABS Take 1 tablet by mouth daily as needed. 03/04/24   [provider]  valACYclovir  (VALTREX ) 1000 MG tablet TAKE 2 TABS (2000 MG) BY MOUTH AT ONSET OF FIRST SYMPTOMS OF COLD SORE. TAKE 2 TABS AGAIN 12HRS LATER, THEN STOP. 05/02/24   Leigh Sober, MD    Family History Family History  Problem Relation Age of Onset   Bipolar disorder Mother    Schizophrenia Mother    Diabetes Father    Heart disease Sister    Suicidality Brother    Heart disease Maternal Grandmother    Alcohol abuse Maternal Grandfather     Gastric cancer Paternal Grandfather    Arthritis Paternal Grandfather     Social History Social History   Tobacco Use   Smoking status: Former    Current packs/day: 1.00    Average packs/day: 1 pack/day for 13.0 years (13.0 ttl pk-yrs)    Types: Cigarettes   Smokeless tobacco: Never  Vaping Use   Vaping status: Every Day  Substance Use Topics   Alcohol use: Yes    Comment: beer occasionally   Drug use: No     Allergies   Codeine and Latex   Review of Systems Review of Systems   Physical Exam Triage Vital Signs ED Triage Vitals [06/20/24 1202]  Encounter Vitals Group     BP 109/75     Girls Systolic BP Percentile      Girls Diastolic BP Percentile      Boys Systolic BP Percentile      Boys Diastolic BP Percentile      Pulse Rate 71     Resp 16     Temp 98.1 F (36.7 C)     Temp Source Oral     SpO2 99 %     Weight      Height      Head Circumference      Peak Flow      Pain Score 9     Pain Loc      Pain Education      Exclude from Growth Chart    No data found.  Updated Vital Signs BP 109/75 (BP Location: Left Arm)   Pulse 71   Temp 98.1 F (36.7 C) (Oral)   Resp 16   LMP 05/31/2024 (Approximate)   SpO2 99%   Visual Acuity Right Eye Distance:   Left Eye Distance:   Bilateral Distance:    Right Eye Near:   Left Eye Near:    Bilateral Near:     Physical Exam Constitutional:      Appearance: Normal appearance.  HENT:     Right Ear: Tympanic membrane, ear canal and external ear normal.     Left Ear: Tympanic membrane, ear canal and external ear normal.     Nose: Congestion present.     Mouth/Throat:     Pharynx: Posterior oropharyngeal erythema present. No oropharyngeal exudate.  Eyes:     Extraocular Movements: Extraocular movements intact.  Cardiovascular:     Rate and Rhythm: Normal rate and regular rhythm.     Pulses: Normal pulses.     Heart sounds: Normal heart sounds.  Pulmonary:     Effort: Pulmonary effort is normal.      Breath sounds: Normal breath sounds.  Lymphadenopathy:     Cervical: Cervical adenopathy present.  Neurological:     Mental Status: She is alert and oriented to person, place, and time. Mental status is at baseline.      UC Treatments / Results  Labs (all labs ordered are listed, but only abnormal results are displayed) Labs Reviewed  POCT RAPID STREP A (OFFICE)  POC COVID19/FLU A&B COMBO    EKG   Radiology No results found.  Procedures Procedures (including critical care time)  Medications Ordered in UC Medications - No data to display  Initial Impression / Assessment and Plan / UC Course  I have reviewed the triage vital signs and the nursing notes.  Pertinent labs & imaging results that were available during my care of the patient were reviewed by me and considered in my medical decision making (see chart for details).  Viral URI with cough  Patient is in no signs of distress nor toxic appearing.  Vital signs are stable.  Low suspicion for pneumonia, pneumothorax or bronchitis and therefore will defer imaging.SABRA  COVID flu and strep testing negative.  Prescribed prednisone  and flexeril .May use additional over-the-counter medications as needed for supportive care.  May follow-up with urgent care as needed if symptoms persist or worsen.  Note given.    Final Clinical Impressions(s) / UC Diagnoses   Final diagnoses:  Viral URI with cough     Discharge Instructions      Your symptoms today are most likely being caused by a virus and  should steadily improve in time it can take up to 7 to 10 days before you truly start to see a turnaround however things will get better    You can take Tylenol  and/or Ibuprofen  as needed for fever reduction and pain relief.   For cough: honey 1/2 to 1 teaspoon (you can dilute the honey in water or another fluid).  You can also use guaifenesin  and dextromethorphan  for cough. You can use a humidifier for chest congestion and cough.  If  you don't have a humidifier, you can sit in the bathroom with the hot shower running.      For sore throat: try warm salt water gargles, cepacol lozenges, throat spray, warm tea or water with lemon/honey, popsicles or ice, or OTC cold relief medicine for throat discomfort.   For congestion: take a daily anti-histamine like Zyrtec , Claritin, and a oral decongestant, such as pseudoephedrine.  You can also use Flonase 1-2 sprays in each nostril daily.   It is important to stay hydrated: drink plenty of fluids (water, gatorade/powerade/pedialyte, juices, or teas) to keep your throat moisturized and help further relieve irritation/discomfort.    ED Prescriptions   None    PDMP not reviewed this encounter.   Teresa Shelba SAUNDERS, NP 06/20/24 1248

## 2024-06-20 NOTE — Discharge Instructions (Addendum)
 Your symptoms today are most likely being caused by a virus and should steadily improve in time it can take up to 7 to 10 days before you truly start to see a turnaround however things will get better  COVID flu and strep test negative  Begin prednisone  every morning with food as directed to help with body aching, avoid ibuprofen  while taking may use Tylenol   May use muscle relaxant twice daily be mindful this can make you dry    You can take Tylenol  and/or Ibuprofen  as needed for fever reduction and pain relief.   For cough: honey 1/2 to 1 teaspoon (you can dilute the honey in water or another fluid).  You can also use guaifenesin  and dextromethorphan  for cough. You can use a humidifier for chest congestion and cough.  If you don't have a humidifier, you can sit in the bathroom with the hot shower running.      For sore throat: try warm salt water gargles, cepacol lozenges, throat spray, warm tea or water with lemon/honey, popsicles or ice, or OTC cold relief medicine for throat discomfort.   For congestion: take a daily anti-histamine like Zyrtec , Claritin, and a oral decongestant, such as pseudoephedrine.  You can also use Flonase 1-2 sprays in each nostril daily.   It is important to stay hydrated: drink plenty of fluids (water, gatorade/powerade/pedialyte, juices, or teas) to keep your throat moisturized and help further relieve irritation/discomfort.

## 2024-06-27 ENCOUNTER — Encounter: Payer: Self-pay | Admitting: Emergency Medicine

## 2024-06-27 ENCOUNTER — Ambulatory Visit
Admission: EM | Admit: 2024-06-27 | Discharge: 2024-06-27 | Disposition: A | Discharge status: Home / Self Care | Attending: Emergency Medicine | Admitting: Emergency Medicine

## 2024-06-27 DIAGNOSIS — J069 Acute upper respiratory infection, unspecified: Secondary | ICD-10-CM

## 2024-06-27 MED ORDER — AZITHROMYCIN 250 MG PO TABS: 250.0000 mg | ORAL_TABLET | Freq: Every day | ORAL | 0 refills | Status: AC

## 2024-06-27 NOTE — Discharge Instructions (Addendum)
 Take the Zithromax as directed.  Follow up with your primary care provider.  Go to the emergency department if you have worsening symptoms.

## 2024-06-27 NOTE — ED Provider Notes (Signed)
 CAY RALPH PELT    CSN: [id number] Arrival date & time: 06/27/24  1236      History   Chief Complaint No chief complaint on file.   HPI Robyn Ortiz is a 38 y.o. female.  Patient presents with 8-day history of congestion, cough, headache.  No fever or shortness of breath.  She has been treating her symptoms with Tylenol  and ibuprofen .  Patient was seen at this urgent care on 06/20/2024; diagnosed with viral URI with cough; negative for strep, COVID, flu; treated with cyclobenzaprine  and prednisone .  The history is provided by the patient and medical records.    Past Medical History:  Diagnosis Date   Diverticulitis    GERD (gastroesophageal reflux disease)    History of bleeding ulcers    told not to take ibuprofen    History of bronchitis 73yrs ago   Migraine    Obesity    Sleep apnea    CPAP    Patient Active Problem List   Diagnosis Date Noted   Myopia of both eyes 06/07/2024   Blurry vision 06/07/2024   Gastroenteritis 04/06/2024   Abnormal serum thyroxine (T4) level 11/12/2023   Daytime sleepiness 11/12/2023   Menorrhagia with regular cycle 10/08/2023   Abnormal uterine bleeding (AUB) 10/08/2023   Deviated nasal septum 09/25/2023   Hypertrophy of nasal turbinates 09/25/2023   Allergic rhinitis 08/13/2023   Gastroesophageal reflux disease with esophagitis without hemorrhage 08/13/2023   Class 1 obesity due to excess calories without serious comorbidity with body mass index (BMI) of 30.0 to 30.9 in adult 03/26/2023   Vapes nicotine  containing substance 02/11/2023   GAD (generalized anxiety disorder) 02/11/2023   Eczema 09/04/2014   Asthma 11/21/2012   Ex-smoker 11/21/2012   Condyloma acuminatum of perianal region 11/21/2012   Migraine 11/21/2012    Past Surgical History:  Procedure Laterality Date   CESAREAN SECTION N/A 04/08/2015   Procedure: CESAREAN SECTION;  Surgeon: Winton Felt, MD;  Location: [facility];  Service: Obstetrics;  Laterality: N/A;    CESAREAN SECTION WITH BILATERAL TUBAL LIGATION  04/08/2015   CHOLECYSTECTOMY  03/27/2014   Dr Dessa   COLONOSCOPY WITH PROPOFOL  N/A 12/18/2021   Procedure: COLONOSCOPY WITH PROPOFOL ;  Surgeon: Unk Corinn Skiff, MD;  Location: [facility] END[facility]COPY;  Service: Gastroenterology;  Laterality: N/A;   ESOPHAGOGASTRODUODENOSCOPY N/A 05/19/2017   Procedure: ESOPHAGOGASTRODUODENOSCOPY (EGD);  Surgeon: Unk Corinn Skiff, MD;  Location: [facility];  Service: Endoscopy;  Laterality: N/A;   ESOPHAGOGASTRODUODENOSCOPY (EGD) WITH PROPOFOL  N/A 12/18/2021   Procedure: ESOPHAGOGASTRODUODENOSCOPY (EGD) WITH PROPOFOL ;  Surgeon: Unk Corinn Skiff, MD;  Location: [facility] ENDOSCOPY;  Service: Gastroenterology;  Laterality: N/A;   GRADUATED/TOLERANCE TEST  03/06/2022   Good exercise tolerance-10:24 min.  Reached peak heart rate of 169 bpm equals 91% MPHR at 186 bpm.  No EKG changes, chest pain or dyspnea.   TONSILLECTOMY  03/10/2012   Procedure: TONSILLECTOMY;  Surgeon: Norleen Notice, MD;  Location: [facility] OR;  Service: ENT;  Laterality: Bilateral;    OB History     Gravida  3   Para  3   Term  3   Preterm      AB      Living  3      SAB      IAB      Ectopic      Multiple  0   Live Births  3        Obstetric Comments  1st Menstrual Cycle:  14  1st Pregnancy:  17  Home Medications    Prior to Admission medications   Medication Sig Start Date End Date Taking? Authorizing Provider  azithromycin  (ZITHROMAX ) 250 MG tablet Take 1 tablet (250 mg total) by mouth daily. Take first 2 tablets together, then 1 every day until finished. 06/27/24  Yes Corlis Burnard DEL, NP  acetaminophen  (TYLENOL ) 325 MG tablet Take 650 mg by mouth every 6 (six) hours as needed for moderate pain.    [provider]  AIMOVIG  140 MG/ML SOAJ INJECT 140 MILLIGRAM(S) SUBCUTANEOUSLY EVERY 4 WEEKS 01/18/24   Dugal, Tabitha, FNP  Azelastine -Fluticasone  (DYMISTA ) 137-50 MCG/ACT SUSP PLACE 1 SPRAY INTO THE  NOSE EVERY 12 (TWELVE) HOURS. 04/03/24   Corwin Antu, FNP  cholestyramine  (QUESTRAN ) 4 g packet TAKE 1 PACKET BY MOUTH 2 TIMES DAILY. 05/20/23   Unk Corinn Skiff, MD  cyclobenzaprine  (FLEXERIL ) 10 MG tablet Take 1 tablet (10 mg total) by mouth 2 (two) times daily as needed for muscle spasms. 06/20/24   White, Shelba SAUNDERS, NP  Dexlansoprazole  30 MG capsule DR Take 1 capsule (30 mg total) by mouth daily. 04/18/24   Dugal, Tabitha, FNP  levocetirizine (XYZAL ) 5 MG tablet Take 1 tablet (5 mg total) by mouth every evening. 11/12/23   Corwin Antu, FNP  montelukast (SINGULAIR) 10 MG tablet TAKE 1 TABLET BY MOUTH EVERY DAY 02/10/24   Dugal, Tabitha, FNP  mupirocin  ointment (BACTROBAN ) 2 % APPLY TO AFFECTED AREA TWICE A DAY 02/11/24   Corwin Antu, FNP  ondansetron  (ZOFRAN ) 4 MG tablet Take 1 tablet (4 mg total) by mouth every 6 (six) hours. 07/15/23   Neysa Caron PARAS, DO  predniSONE  (STERAPRED UNI-PAK 21 TAB) 10 MG (21) TBPK tablet Take by mouth daily. Take 6 tabs by mouth daily  for 1 days, then 5 tabs for 1 days, then 4 tabs for 1 days, then 3 tabs for 1 days, 2 tabs for 1 days, then 1 tab by mouth daily for 1 days 06/20/24   Teresa Shelba SAUNDERS, NP  PROAIR  HFA 108 (90 Base) MCG/ACT inhaler Inhale 2 puffs into the lungs every 6 (six) hours as needed for wheezing or shortness of breath. 02/11/23   Dugal, Antu, FNP  rizatriptan  (MAXALT ) 5 MG tablet TAKE 1 TABLET BY MOUTH AS NEEDED FOR MIGRAINE. MAY REPEAT IN 2 HOURS IF NEEDED 02/10/24   Corwin Antu, FNP  scopolamine  (TRANSDERM-SCOP) 1 MG/3DAYS Place 1 patch (1 mg total) onto the skin every 3 (three) days. 04/18/24   Dugal, Tabitha, FNP  tirzepatide  (ZEPBOUND ) 5 MG/0.5ML Pen Inject 5 mg into the skin once a week. Inject 0.5 mg [place] weekly 05/09/24   Dugal, Tabitha, FNP  tiZANidine  (ZANAFLEX ) 4 MG tablet Take 1 tablet (4 mg total) by mouth at bedtime as needed for muscle spasms. 03/09/24   Copland, Jacques, MD  UBRELVY 100 MG TABS Take 1 tablet by mouth daily as  needed. 03/04/24   [provider]  valACYclovir  (VALTREX ) 1000 MG tablet TAKE 2 TABS (2000 MG) BY MOUTH AT ONSET OF FIRST SYMPTOMS OF COLD SORE. TAKE 2 TABS AGAIN 12HRS LATER, THEN STOP. 05/02/24   Leigh Sober, MD    Family History Family History  Problem Relation Age of Onset   Bipolar disorder Mother    Schizophrenia Mother    Diabetes Father    Heart disease Sister    Suicidality Brother    Heart disease Maternal Grandmother    Alcohol abuse Maternal Grandfather    Gastric cancer Paternal Grandfather    Arthritis Paternal Grandfather  Social History Social History   Tobacco Use   Smoking status: Former    Current packs/day: 1.00    Average packs/day: 1 pack/day for 13.0 years (13.0 ttl pk-yrs)    Types: Cigarettes   Smokeless tobacco: Never  Vaping Use   Vaping status: Every Day  Substance Use Topics   Alcohol use: Yes    Comment: beer occasionally   Drug use: No     Allergies   Codeine and Latex   Review of Systems Review of Systems  Constitutional:  Negative for chills and fever.  HENT:  Positive for congestion and postnasal drip. Negative for ear pain and sore throat.   Respiratory:  Positive for cough. Negative for shortness of breath.      Physical Exam Triage Vital Signs ED Triage Vitals [06/27/24 1354]  Encounter Vitals Group     BP 112/76     Girls Systolic BP Percentile      Girls Diastolic BP Percentile      Boys Systolic BP Percentile      Boys Diastolic BP Percentile      Pulse Rate 65     Resp 18     Temp 97.8 F (36.6 C)     Temp src      SpO2 98 %     Weight      Height      Head Circumference      Peak Flow      Pain Score      Pain Loc      Pain Education      Exclude from Growth Chart    No data found.  Updated Vital Signs BP 112/76   Pulse 65   Temp 97.8 F (36.6 C)   Resp 18   LMP 05/31/2024 (Approximate)   SpO2 98%   Visual Acuity Right Eye Distance:   Left Eye Distance:   Bilateral Distance:     Right Eye Near:   Left Eye Near:    Bilateral Near:     Physical Exam Constitutional:      General: She is not in acute distress. HENT:     Right Ear: Tympanic membrane normal.     Left Ear: Tympanic membrane normal.     Nose: Congestion present.     Mouth/Throat:     Mouth: Mucous membranes are moist.     Pharynx: Oropharynx is clear.  Cardiovascular:     Rate and Rhythm: Normal rate and regular rhythm.     Heart sounds: Normal heart sounds.  Pulmonary:     Effort: Pulmonary effort is normal. No respiratory distress.     Breath sounds: Normal breath sounds.  Neurological:     Mental Status: She is alert.      UC Treatments / Results  Labs (all labs ordered are listed, but only abnormal results are displayed) Labs Reviewed - No data to display  EKG   Radiology No results found.  Procedures Procedures (including critical care time)  Medications Ordered in UC Medications - No data to display  Initial Impression / Assessment and Plan / UC Course  I have reviewed the triage vital signs and the nursing notes.  Pertinent labs & imaging results that were available during my care of the patient were reviewed by me and considered in my medical decision making (see chart for details).    Acute upper respiratory infection.  Afebrile and vital signs are stable.  Lungs are clear and O2 sat is 98%  on room air.  Patient has been symptomatic for 8 days and did not improve with prednisone .  Treating today with Zithromax  as patient reports this has worked well for her in the past.  Instructed her to follow-up with her PCP.  ED precautions given.  Education provided on upper respiratory infection.  She agrees to plan of care.  Final Clinical Impressions(s) / UC Diagnoses   Final diagnoses:  Acute upper respiratory infection     Discharge Instructions      Take the Zithromax  as directed.  Follow-up with your primary care provider.  Go to the emergency department if you  have worsening symptoms.     ED Prescriptions     Medication Sig Dispense Auth. Provider   azithromycin  (ZITHROMAX ) 250 MG tablet Take 1 tablet (250 mg total) by mouth daily. Take first 2 tablets together, then 1 every day until finished. 6 tablet Corlis Burnard DEL, NP      PDMP not reviewed this encounter.   Corlis Burnard DEL, NP 06/27/24 [phone]

## 2024-07-08 ENCOUNTER — Ambulatory Visit
Admission: EM | Admit: 2024-07-08 | Discharge: 2024-07-08 | Disposition: A | Discharge status: Home / Self Care | Attending: Emergency Medicine | Admitting: Emergency Medicine

## 2024-07-08 ENCOUNTER — Encounter: Payer: Self-pay | Admitting: Emergency Medicine

## 2024-07-08 DIAGNOSIS — R21 Rash and other nonspecific skin eruption: Secondary | ICD-10-CM

## 2024-07-08 MED ORDER — PREDNISONE 10 MG (21) PO TBPK: ORAL_TABLET | Freq: Every day | ORAL | 0 refills | Status: AC

## 2024-07-08 MED ORDER — METHYLPREDNISOLONE SODIUM SUCC 125 MG IJ SOLR
60.0000 mg | Freq: Once | INTRAMUSCULAR | Status: AC
  Administered 2024-07-08: 60 mg via INTRAMUSCULAR

## 2024-07-08 NOTE — ED Triage Notes (Signed)
 Patient reports bilateral facial swelling and difficulty swallowing started this morning 07/08/24. Patient has only taken daily allergy medication today for symptoms.

## 2024-07-08 NOTE — Discharge Instructions (Addendum)
 Today you are evaluated for possible allergic reaction, able to see a rash to the cheeks which is most likely or source of symptoms, do not have a cause for your rash as there has been no changes in your external factors  You have been given an injection of steroids today in the office today to help reduce the inflammatory process that occurs with this rash which will help minimize your itching as well as begin to clear  Starting tomorrow take prednisone  every morning with food as directed, to continue the above process  You may continue use of topical calamine or Benadryl  cream to help manage itching, you may also continue oral Benadryl   Please avoid long exposures to heat such as a hot steamy shower or being outside as this may cause further irritation to your rash  You may follow-up with his urgent care as needed if symptoms persist or worsen

## 2024-07-08 NOTE — ED Provider Notes (Signed)
 CAY RALPH PELT    CSN: [id number] Arrival date & time: 07/08/24  1127      History   Chief Complaint Chief Complaint  Patient presents with   Facial Swelling   Dysphagia    HPI Jaleah Welden is a 38 y.o. female.   Patient presents for evaluation of left-sided facial swelling, trouble swallowing a burning sensation to the skin and gland aching beginning this morning.  Feels mildly short of breath but denies wheezing or cough.  Has attempted use of montelukast and cetirizine  which has provided no relief.  Denies changes in toiletries diet medications, no other contact has similar symptoms.    Past Medical History:  Diagnosis Date   Diverticulitis    GERD (gastroesophageal reflux disease)    History of bleeding ulcers    told not to take ibuprofen    History of bronchitis 72yrs ago   Migraine    Obesity    Sleep apnea    CPAP    Patient Active Problem List   Diagnosis Date Noted   Myopia of both eyes 06/07/2024   Blurry vision 06/07/2024   Gastroenteritis 04/06/2024   Abnormal serum thyroxine (T4) level 11/12/2023   Daytime sleepiness 11/12/2023   Menorrhagia with regular cycle 10/08/2023   Abnormal uterine bleeding (AUB) 10/08/2023   Deviated nasal septum 09/25/2023   Hypertrophy of nasal turbinates 09/25/2023   Allergic rhinitis 08/13/2023   Gastroesophageal reflux disease with esophagitis without hemorrhage 08/13/2023   Class 1 obesity due to excess calories without serious comorbidity with body mass index (BMI) of 30.0 to 30.9 in adult 03/26/2023   Vapes nicotine  containing substance 02/11/2023   GAD (generalized anxiety disorder) 02/11/2023   Eczema 09/04/2014   Asthma 11/21/2012   Ex-smoker 11/21/2012   Condyloma acuminatum of perianal region 11/21/2012   Migraine 11/21/2012    Past Surgical History:  Procedure Laterality Date   CESAREAN SECTION N/A 04/08/2015   Procedure: CESAREAN SECTION;  Surgeon: Winton Felt, MD;  Location: [facility];   Service: Obstetrics;  Laterality: N/A;   CESAREAN SECTION WITH BILATERAL TUBAL LIGATION  04/08/2015   CHOLECYSTECTOMY  03/27/2014   Dr Dessa   COLONOSCOPY WITH PROPOFOL  N/A 12/18/2021   Procedure: COLONOSCOPY WITH PROPOFOL ;  Surgeon: Unk Corinn Skiff, MD;  Location: [facility];  Service: Gastroenterology;  Laterality: N/A;   ESOPHAGOGASTRODUODENOSCOPY N/A 05/19/2017   Procedure: ESOPHAGOGASTRODUODENOSCOPY (EGD);  Surgeon: Unk Corinn Skiff, MD;  Location: [facility];  Service: Endoscopy;  Laterality: N/A;   ESOPHAGOGASTRODUODENOSCOPY (EGD) WITH PROPOFOL  N/A 12/18/2021   Procedure: ESOPHAGOGASTRODUODENOSCOPY (EGD) WITH PROPOFOL ;  Surgeon: Unk Corinn Skiff, MD;  Location: [facility] ENDOSCOPY;  Service: Gastroenterology;  Laterality: N/A;   GRADUATED/TOLERANCE TEST  03/06/2022   Good exercise tolerance-10:24 min.  Reached peak heart rate of 169 bpm equals 91% MPHR at 186 bpm.  No EKG changes, chest pain or dyspnea.   TONSILLECTOMY  03/10/2012   Procedure: TONSILLECTOMY;  Surgeon: Norleen Notice, MD;  Location: [facility] OR;  Service: ENT;  Laterality: Bilateral;    OB History     Gravida  3   Para  3   Term  3   Preterm      AB      Living  3      SAB      IAB      Ectopic      Multiple  0   Live Births  3        Obstetric Comments  1st Menstrual Cycle:  14  1st Pregnancy:  17          Home Medications    Prior to Admission medications   Medication Sig Start Date End Date Taking? Authorizing Provider  acetaminophen  (TYLENOL ) 325 MG tablet Take 650 mg by mouth every 6 (six) hours as needed for moderate pain.    [provider]  AIMOVIG  140 MG/ML SOAJ INJECT 140 MILLIGRAM(S) SUBCUTANEOUSLY EVERY 4 WEEKS 01/18/24   Dugal, Tabitha, FNP  Azelastine -Fluticasone  (DYMISTA ) 137-50 MCG/ACT SUSP PLACE 1 SPRAY INTO THE NOSE EVERY 12 (TWELVE) HOURS. 04/03/24   Dugal, Tabitha, FNP  azithromycin  (ZITHROMAX ) 250 MG tablet Take 1 tablet (250 mg total) by mouth  daily. Take first 2 tablets together, then 1 every day until finished. 06/27/24   Corlis Burnard DEL, NP  cholestyramine  (QUESTRAN ) 4 g packet TAKE 1 PACKET BY MOUTH 2 TIMES DAILY. 05/20/23   Unk Corinn Skiff, MD  cyclobenzaprine  (FLEXERIL ) 10 MG tablet Take 1 tablet (10 mg total) by mouth 2 (two) times daily as needed for muscle spasms. 06/20/24   Cristina Mattern, Shelba SAUNDERS, NP  Dexlansoprazole  30 MG capsule DR Take 1 capsule (30 mg total) by mouth daily. 04/18/24   Dugal, Tabitha, FNP  levocetirizine (XYZAL ) 5 MG tablet Take 1 tablet (5 mg total) by mouth every evening. 11/12/23   Corwin Antu, FNP  montelukast (SINGULAIR) 10 MG tablet TAKE 1 TABLET BY MOUTH EVERY DAY 02/10/24   Dugal, Tabitha, FNP  mupirocin  ointment (BACTROBAN ) 2 % APPLY TO AFFECTED AREA TWICE A DAY 02/11/24   Corwin Antu, FNP  ondansetron  (ZOFRAN ) 4 MG tablet Take 1 tablet (4 mg total) by mouth every 6 (six) hours. 07/15/23   Neysa Caron PARAS, DO  predniSONE  (STERAPRED UNI-PAK 21 TAB) 10 MG (21) TBPK tablet Take by mouth daily. Take 6 tabs by mouth daily  for 1 days, then 5 tabs for 1 days, then 4 tabs for 1 days, then 3 tabs for 1 days, 2 tabs for 1 days, then 1 tab by mouth daily for 1 days 06/20/24   Teresa Shelba SAUNDERS, NP  PROAIR  HFA 108 (90 Base) MCG/ACT inhaler Inhale 2 puffs into the lungs every 6 (six) hours as needed for wheezing or shortness of breath. 02/11/23   Dugal, Antu, FNP  rizatriptan  (MAXALT ) 5 MG tablet TAKE 1 TABLET BY MOUTH AS NEEDED FOR MIGRAINE. MAY REPEAT IN 2 HOURS IF NEEDED 02/10/24   Corwin Antu, FNP  scopolamine  (TRANSDERM-SCOP) 1 MG/3DAYS Place 1 patch (1 mg total) onto the skin every 3 (three) days. 04/18/24   Dugal, Tabitha, FNP  tirzepatide  (ZEPBOUND ) 5 MG/0.5ML Pen Inject 5 mg into the skin once a week. Inject 0.5 mg [place] weekly 05/09/24   Dugal, Tabitha, FNP  tiZANidine  (ZANAFLEX ) 4 MG tablet Take 1 tablet (4 mg total) by mouth at bedtime as needed for muscle spasms. 03/09/24   Copland, Jacques, MD  UBRELVY 100 MG  TABS Take 1 tablet by mouth daily as needed. 03/04/24   [provider]  valACYclovir  (VALTREX ) 1000 MG tablet TAKE 2 TABS (2000 MG) BY MOUTH AT ONSET OF FIRST SYMPTOMS OF COLD SORE. TAKE 2 TABS AGAIN 12HRS LATER, THEN STOP. 05/02/24   Leigh Sober, MD    Family History Family History  Problem Relation Age of Onset   Bipolar disorder Mother    Schizophrenia Mother    Diabetes Father    Heart disease Sister    Suicidality Brother    Heart disease Maternal Grandmother    Alcohol abuse Maternal Grandfather  Gastric cancer Paternal Grandfather    Arthritis Paternal Grandfather     Social History Social History   Tobacco Use   Smoking status: Former    Current packs/day: 1.00    Average packs/day: 1 pack/day for 13.0 years (13.0 ttl pk-yrs)    Types: Cigarettes   Smokeless tobacco: Never  Vaping Use   Vaping status: Every Day  Substance Use Topics   Alcohol use: Yes    Comment: beer occasionally   Drug use: No     Allergies   Codeine and Latex   Review of Systems Review of Systems   Physical Exam Triage Vital Signs ED Triage Vitals  Encounter Vitals Group     BP 07/08/24 1227 109/73     Girls Systolic BP Percentile --      Girls Diastolic BP Percentile --      Boys Systolic BP Percentile --      Boys Diastolic BP Percentile --      Pulse Rate 07/08/24 1227 69     Resp 07/08/24 1227 18     Temp 07/08/24 1227 98.1 F (36.7 C)     Temp Source 07/08/24 1227 Oral     SpO2 07/08/24 1227 98 %     Weight --      Height --      Head Circumference --      Peak Flow --      Pain Score 07/08/24 1230 0     Pain Loc --      Pain Education --      Exclude from Growth Chart --    No data found.  Updated Vital Signs BP 109/73 (BP Location: Left Arm)   Pulse 69   Temp 98.1 F (36.7 C) (Oral)   Resp 18   LMP 07/03/2024 (Exact Date)   SpO2 98%   Visual Acuity Right Eye Distance:   Left Eye Distance:   Bilateral Distance:    Right Eye Near:   Left  Eye Near:    Bilateral Near:     Physical Exam Constitutional:      Appearance: Normal appearance.  HENT:     Mouth/Throat:     Comments: Pharynx clear without obstruction Eyes:     Extraocular Movements: Extraocular movements intact.  Cardiovascular:     Rate and Rhythm: Normal rate and regular rhythm.     Pulses: Normal pulses.     Heart sounds: Normal heart sounds.  Pulmonary:     Effort: Pulmonary effort is normal.     Breath sounds: Normal breath sounds.  Lymphadenopathy:     Cervical: No cervical adenopathy.  Skin:    Comments: Erythematous papular rash present to the bilateral cheeks with mild facial swelling  Neurological:     Mental Status: She is oriented to person, place, and time.      UC Treatments / Results  Labs (all labs ordered are listed, but only abnormal results are displayed) Labs Reviewed - No data to display  EKG   Radiology No results found.  Procedures Procedures (including critical care time)  Medications Ordered in UC Medications - No data to display  Initial Impression / Assessment and Plan / UC Course  I have reviewed the triage vital signs and the nursing notes.  Pertinent labs & imaging results that were available during my care of the patient were reviewed by me and considered in my medical decision making (see chart for details).  Facial rash  Inflammatory rash noted to  the face, no signs of infection, unknown etiology as there has been no change in external factors, will move forward with symptomatic treatment, methylprednisolone  IM given and prescribed oral prednisone  for home use recommended over-the-counter medications and nonpharmacological supportive care follow-up with urgent care as needed given strict ER precautions for any difficulty breathing Final Clinical Impressions(s) / UC Diagnoses   Final diagnoses:  None   Discharge Instructions   None    ED Prescriptions   None    PDMP not reviewed this encounter.    Teresa Shelba SAUNDERS, NP 07/08/24 1314

## 2024-07-09 ENCOUNTER — Other Ambulatory Visit: Payer: Self-pay | Admitting: Family

## 2024-07-09 DIAGNOSIS — K2100 Gastro-esophageal reflux disease with esophagitis, without bleeding: Secondary | ICD-10-CM

## 2024-07-10 ENCOUNTER — Other Ambulatory Visit: Payer: Self-pay | Admitting: Family

## 2024-07-10 DIAGNOSIS — G43101 Migraine with aura, not intractable, with status migrainosus: Secondary | ICD-10-CM

## 2024-07-10 DIAGNOSIS — K2100 Gastro-esophageal reflux disease with esophagitis, without bleeding: Secondary | ICD-10-CM

## 2024-07-11 ENCOUNTER — Telehealth: Payer: Self-pay

## 2024-07-11 ENCOUNTER — Other Ambulatory Visit (HOSPITAL_COMMUNITY): Payer: Self-pay

## 2024-07-11 NOTE — Telephone Encounter (Signed)
 Pharmacy Patient Advocate Encounter   Received notification from Onbase that prior authorization for Aimovig  140 is required/requested.   Insurance verification completed.   The patient is insured through [facility] MEDICAID.   Per test claim: PA required; PA submitted to above mentioned insurance via Latent Key/confirmation #/EOC [id number] Status is pending

## 2024-07-11 NOTE — Telephone Encounter (Signed)
 Pharmacy Patient Advocate Encounter  Received notification from [facility] MEDICAID that Prior Authorization for Dexlansoprazole  30 DR has been APPROVED from 07/11/24 to 07/11/25. Ran test claim, Copay is $4.00. This test claim was processed through [facility]- copay amounts may vary at other pharmacies due to pharmacy/plan contracts, or as the patient moves through the different stages of their insurance plan.   PA #/Case ID/Reference #: # [id number]

## 2024-07-11 NOTE — Telephone Encounter (Signed)
 Pharmacy Patient Advocate Encounter   Received notification from Onbase that prior authorization for Dexlansoprazole  30DR caps is required/requested.   Insurance verification completed.   The patient is insured through [facility] MEDICAID.   Per test claim: PA required; PA submitted to above mentioned insurance via Latent Key/confirmation #/EOC [facility] Status is pending

## 2024-07-11 NOTE — Telephone Encounter (Signed)
 Pharmacy Patient Advocate Encounter  Received notification from [facility] MEDICAID that Prior Authorization for Aimovig  140 has been APPROVED from 07/11/24 to 07/11/25. Unable to obtain price due to refill too soon rejection, last fill date 07/11/24 next available fill date12/15/25   PA #/Case ID/Reference #: # [id number]

## 2024-07-12 ENCOUNTER — Encounter: Payer: Self-pay | Admitting: Family

## 2024-07-12 DIAGNOSIS — E6609 Other obesity due to excess calories: Secondary | ICD-10-CM

## 2024-07-19 MED ORDER — CONTRAVE 8-90 MG PO TB12: ORAL_TABLET | ORAL | 0 refills | Status: AC

## 2024-07-19 NOTE — Addendum Note (Signed)
 Addended by: CORWIN ANTU on: 07/19/2024 10:47 AM   Modules accepted: Orders

## 2024-08-27 ENCOUNTER — Other Ambulatory Visit: Payer: Self-pay | Admitting: Family
